# Patient Record
Sex: Male | Born: 1950
Health system: Southern US, Community
[De-identification: ages and names within clinical notes are randomized; demographics above are authoritative.]

## PROBLEM LIST (undated history)

## (undated) ENCOUNTER — Emergency Department: Discharge status: Absent without leave | Payer: Self-pay

## (undated) DIAGNOSIS — E785 Hyperlipidemia, unspecified: Secondary | ICD-10-CM

## (undated) DIAGNOSIS — M549 Dorsalgia, unspecified: Secondary | ICD-10-CM

## (undated) DIAGNOSIS — T7840XA Allergy, unspecified, initial encounter: Secondary | ICD-10-CM

## (undated) DIAGNOSIS — M2550 Pain in unspecified joint: Secondary | ICD-10-CM

## (undated) DIAGNOSIS — M1990 Unspecified osteoarthritis, unspecified site: Secondary | ICD-10-CM

## (undated) DIAGNOSIS — R7303 Prediabetes: Secondary | ICD-10-CM

## (undated) DIAGNOSIS — M25551 Pain in right hip: Secondary | ICD-10-CM

## (undated) DIAGNOSIS — G4730 Sleep apnea, unspecified: Secondary | ICD-10-CM

## (undated) DIAGNOSIS — K5730 Diverticulosis of large intestine without perforation or abscess without bleeding: Secondary | ICD-10-CM

## (undated) DIAGNOSIS — I10 Essential (primary) hypertension: Secondary | ICD-10-CM

## (undated) HISTORY — DX: Unspecified osteoarthritis, unspecified site: M19.90

## (undated) HISTORY — DX: Morbid (severe) obesity due to excess calories: E66.01

## (undated) HISTORY — PX: JOINT REPLACEMENT: SHX530

## (undated) HISTORY — DX: Hyperlipidemia, unspecified: E78.5

## (undated) HISTORY — DX: Dorsalgia, unspecified: M54.9

## (undated) HISTORY — DX: Pain in right hip: M25.551

## (undated) HISTORY — DX: Diverticulosis of large intestine without perforation or abscess without bleeding: K57.30

## (undated) HISTORY — DX: Sleep apnea, unspecified: G47.30

## (undated) HISTORY — DX: Prediabetes: R73.03

## (undated) HISTORY — PX: KNEE SURGERY: SHX244

## (undated) HISTORY — DX: Pain in unspecified joint: M25.50

## (undated) HISTORY — DX: Allergy, unspecified, initial encounter: T78.40XA

## (undated) HISTORY — PX: EYE SURGERY: SHX253

---

## 2010-11-06 LAB — HM COLONOSCOPY

## 2012-03-15 DEATH — deceased

## 2013-02-17 ENCOUNTER — Telehealth (HOSPITAL_COMMUNITY): Payer: Self-pay | Admitting: Emergency Medicine

## 2013-02-17 ENCOUNTER — Encounter (HOSPITAL_COMMUNITY): Payer: Self-pay

## 2013-02-17 ENCOUNTER — Emergency Department (HOSPITAL_COMMUNITY)
Admission: EM | Admit: 2013-02-17 | Discharge: 2013-02-17 | Disposition: A | Discharge status: Home / Self Care | Payer: BC Managed Care – PPO | Attending: Emergency Medicine | Admitting: Emergency Medicine

## 2013-02-17 DIAGNOSIS — Y9389 Activity, other specified: Secondary | ICD-10-CM | POA: Insufficient documentation

## 2013-02-17 DIAGNOSIS — S45809A Unspecified injury of other specified blood vessels at shoulder and upper arm level, unspecified arm, initial encounter: Secondary | ICD-10-CM | POA: Insufficient documentation

## 2013-02-17 DIAGNOSIS — W260XXA Contact with knife, initial encounter: Secondary | ICD-10-CM | POA: Insufficient documentation

## 2013-02-17 DIAGNOSIS — E785 Hyperlipidemia, unspecified: Secondary | ICD-10-CM | POA: Insufficient documentation

## 2013-02-17 DIAGNOSIS — S55102A Unspecified injury of radial artery at forearm level, left arm, initial encounter: Secondary | ICD-10-CM

## 2013-02-17 DIAGNOSIS — W261XXA Contact with sword or dagger, initial encounter: Secondary | ICD-10-CM | POA: Insufficient documentation

## 2013-02-17 DIAGNOSIS — Y92009 Unspecified place in unspecified non-institutional (private) residence as the place of occurrence of the external cause: Secondary | ICD-10-CM | POA: Insufficient documentation

## 2013-02-17 DIAGNOSIS — Z79899 Other long term (current) drug therapy: Secondary | ICD-10-CM | POA: Insufficient documentation

## 2013-02-17 DIAGNOSIS — I10 Essential (primary) hypertension: Secondary | ICD-10-CM | POA: Insufficient documentation

## 2013-02-17 HISTORY — DX: Hyperlipidemia, unspecified: E78.5

## 2013-02-17 HISTORY — DX: Essential (primary) hypertension: I10

## 2013-02-17 LAB — POCT I-STAT, CHEM 8
Chloride: 107 mEq/L (ref 96–112)
Creatinine, Ser: 1.1 mg/dL (ref 0.50–1.35)
Glucose, Bld: 90 mg/dL (ref 70–99)
Potassium: 3.4 mEq/L — ABNORMAL LOW (ref 3.5–5.1)

## 2013-02-17 LAB — CBC
HCT: 42.8 % (ref 39.0–52.0)
Hemoglobin: 14.7 g/dL (ref 13.0–17.0)
WBC: 6.6 10*3/uL (ref 4.0–10.5)

## 2013-02-17 MED ORDER — OXYCODONE-ACETAMINOPHEN 5-325 MG PO TABS
2.0000 | ORAL_TABLET | Freq: Once | ORAL | Status: AC
  Administered 2013-02-17: 2 via ORAL
  Filled 2013-02-17: qty 2

## 2013-02-17 MED ORDER — HYDROMORPHONE HCL PF 1 MG/ML IJ SOLN
0.5000 mg | Freq: Once | INTRAMUSCULAR | Status: DC
  Filled 2013-02-17: qty 1

## 2013-02-17 MED ORDER — DIPHENHYDRAMINE HCL 50 MG/ML IJ SOLN: 25.0000 mg | INTRAMUSCULAR | Status: DC | PRN

## 2013-02-17 NOTE — ED Notes (Signed)
MD at bedside.Dr. Izora Ribas

## 2013-02-17 NOTE — ED Notes (Signed)
Pt states he currently has conjunctivitis bilaterally.

## 2013-02-17 NOTE — ED Notes (Signed)
Re-paged Dr. Izora Ribas to [phone]

## 2013-02-17 NOTE — ED Notes (Signed)
Pt ambulatory to discharge with wife. Pt a x 4

## 2013-02-17 NOTE — H&P (Signed)
Reason for Consult: L wrist laceration Referring Physician: ER  John Morrow is an 61 y.o. right handed male.  HPI: using knife to open a bag and lacerated L wrist; c/o bleeding , pain; denies numbness of fingers, enies loss of function of fingers  Past Medical History  Diagnosis Date  . Hypertension   . Hyperlipemia     Past Surgical History  Procedure Laterality Date  . Knee surgery      No family history on file.  Social History:  reports that he has never smoked. He does not have any smokeless tobacco history on file. He reports that  drinks alcohol. He reports that he does not use illicit drugs.  Allergies:  Allergies  Allergen Reactions  . Morphine And Related     Rash, vomiting, itching     Medications: I have reviewed the patient's current medications.  Results for orders placed during the hospital encounter of 02/17/13 (from the past 48 hour(s))  CBC     Status: None   Collection Time    02/17/13 12:52 PM      Result Value Range   WBC 6.6  4.0 - 10.5 K/uL   RBC 4.88  4.22 - 5.81 MIL/uL   Hemoglobin 14.7  13.0 - 17.0 g/dL   HCT 59.5  63.8 - 75.6 %   MCV 87.7  78.0 - 100.0 fL   MCH 30.1  26.0 - 34.0 pg   MCHC 34.3  30.0 - 36.0 g/dL   RDW 43.3  29.5 - 18.8 %   Platelets 194  150 - 400 K/uL  POCT I-STAT, CHEM 8     Status: Abnormal   Collection Time    02/17/13 12:54 PM      Result Value Range   Sodium 143  135 - 145 mEq/L   Potassium 3.4 (*) 3.5 - 5.1 mEq/L   Chloride 107  96 - 112 mEq/L   BUN 24 (*) 6 - 23 mg/dL   Creatinine, Ser 4.16  0.50 - 1.35 mg/dL   Glucose, Bld 90  70 - 99 mg/dL   Calcium, Ion 6.06  3.01 - 1.30 mmol/L   TCO2 26  0 - 100 mmol/L   Hemoglobin 14.6  13.0 - 17.0 g/dL   HCT 60.1  09.3 - 23.5 %    No results found.  Pertinent items are noted in HPI. Temp:  [98.5 F (36.9 C)-98.7 F (37.1 C)] 98.5 F (36.9 C) (06/05 1439) Pulse Rate:  [68-98] 68 (06/05 1439) Resp:  [18-20] 18 (06/05 1439) BP: (114-133)/(59-73) 121/61 mmHg  (06/05 1439) SpO2:  [96 %] 96 % (06/05 1439) General appearance: alert and cooperative L wrist with 1cm laceration of distal wrist overlying where radial artery is, no active bleeding currently, forearm compartement sl swollen no ecchimosis, no evidence of compartment syndrome, able to flex, extend all fingers and thumb, neuro intact, absent palpable radial pulse but cap refill all fingers 2 sec   Assessment/Plan: Laceration of L wrist with probable radial artery injury  Plan:  Discussed exploration of wound repair of artery with patient and wife, pt does not desire this, wishes to return to [facility] to see surgeon there.  Expressed in detail the risks of re-bleeding with them, especially if patient aggressively moves wrist.  I recommend exploration and repair of artery.  They understand risks of rebleeding, compartment syndrome ...which may become an emergency.  They wish to be d/c'd.  John Morrow CHRISTOPHER 02/17/2013, 3:11 PM

## 2013-02-17 NOTE — ED Notes (Signed)
MD at bedside. (Dr. Miller) 

## 2013-02-17 NOTE — ED Provider Notes (Signed)
History     CSN: [id number]  Arrival date & time 02/17/13  1132   First MD Initiated Contact with Patient 02/17/13 1145      Chief Complaint  Patient presents with  . Puncture Wound    (Consider location/radiation/quality/duration/timing/severity/associated sxs/prior treatment) HPI Comments: The patient is a 62 year old male who presents after the acute onset of a puncture wound to his left distal radial forearm by a utility knife as he was doing some work at home. This pain was acute in onset, persistent, moderate, gradually worsening and associated with swelling of his left forearm, swelling in his hand. This is worse with palpation, had a significant amount of bleeding initially but after pressure and a dressing the bleeding has subsided and has been replaced with swelling of the forearm. He has no numbness or tingling to the fingers. No other injuries  The history is provided by the patient and the spouse.    Past Medical History  Diagnosis Date  . Hypertension   . Hyperlipemia     Past Surgical History  Procedure Laterality Date  . Knee surgery      No family history on file.  History  Substance Use Topics  . Smoking status: Never Smoker   . Smokeless tobacco: Not on file  . Alcohol Use: Yes     Comment: occassionally      Review of Systems  All other systems reviewed and are negative.    Allergies  Morphine and related  Home Medications   Current Outpatient Rx  Name  Route  Sig  Dispense  Refill  . lisinopril (PRINIVIL,ZESTRIL) 20 MG tablet   Oral   Take 20 mg by mouth daily.         . simvastatin (ZOCOR) 40 MG tablet   Oral   Take 40 mg by mouth every evening.         . tamsulosin (FLOMAX) 0.4 MG CAPS   Oral   Take 0.4 mg by mouth daily after supper.           BP 121/61  Pulse 68  Temp(Src) 98.5 F (36.9 C) (Oral)  Resp 18  SpO2 96%  Physical Exam  Nursing note and vitals reviewed. Constitutional: He appears well-developed and  well-nourished. No distress.  HENT:  Head: Normocephalic and atraumatic.  Mouth/Throat: Oropharynx is clear and moist. No oropharyngeal exudate.  Eyes: Conjunctivae and EOM are normal. Pupils are equal, round, and reactive to light. Right eye exhibits no discharge. Left eye exhibits no discharge. No scleral icterus.  Neck: Normal range of motion. Neck supple. No JVD present. No thyromegaly present.  Cardiovascular: Normal rate, regular rhythm, normal heart sounds and intact distal pulses.  Exam reveals no gallop and no friction rub.   No murmur heard. When the ulnar artery is occluded, capillary refill time is significantly delayed, radial pulse felt at wound site and just distal to wound site  Pulmonary/Chest: Effort normal and breath sounds normal. No respiratory distress. He has no wheezes. He has no rales.  Abdominal: Soft. Bowel sounds are normal. He exhibits no distension and no mass. There is no tenderness.  Musculoskeletal: Normal range of motion. He exhibits tenderness ( over the L forearm - compartment is swollen adn mildly tender.  Laceration approx 1cm and wtih mild bleeding). He exhibits no edema.  Able to make fist with some pain  Lymphadenopathy:    He has no cervical adenopathy.  Neurological: He is alert. Coordination normal.  No deficits to median  and radial or ulnar nerves,. Normals esnation and motor to the L hand  Skin: Skin is warm and dry. No rash noted. No erythema.  Psychiatric: He has a normal mood and affect. His behavior is normal.    ED Course  Procedures (including critical care time)  Labs Reviewed  POCT I-STAT, CHEM 8 - Abnormal; Notable for the following:    Potassium 3.4 (*)    BUN 24 (*)    All other components within normal limits  CBC   No results found.   1. Radial artery injury, left, initial encounter       MDM  62 year old male, puncture wound over the radial artery at the left wrist, will discussed with hand surgery regarding possible  repair as the patient does have delayed capillary refill when ulnar artery is occluded manually. Concern for increased expanding compartment of the left forearm, at this time the patient is neurovascular status is intact.  Patient declines pain medications at this time  This case with Hand surgeon Izora Ribas) who will evaluate the person in the emergency department, he is currently in the operating room but should be here shortly.  Surgeon has discussed with family the need for her and recommendation for hand surgery to fix a likely radial artery laceration. The patient has expressed his understanding and has refused this intervention to the surgeon. I have discussed this with the patient and he requests discharge and that he can go home to his home town and have this followed up at that time. He has been given a prescription for pain medication by the surgeon, I have given him a dose of pain medication prior to discharge, wrist immobilizer   Meds given in ED:  Medications  oxyCODONE-acetaminophen (PERCOCET/ROXICET) 5-325 MG per tablet 2 tablet (not administered)    New Prescriptions   No medications on file      Vida Roller, MD 02/17/13 1545

## 2013-02-17 NOTE — ED Notes (Addendum)
Pt states he was cutting a plastic tie on bungie cords when he cut himself with a knife. Pt arrives to room A-7 holding arm, drawn to body, site actively bleeding and blood noted on pts clothes. Red and  Swollen puncture site. Pulse present, cap refill <3 secs. Bleeding controlled and cleaned.

## 2014-03-31 DIAGNOSIS — G4730 Sleep apnea, unspecified: Secondary | ICD-10-CM | POA: Insufficient documentation

## 2016-01-30 ENCOUNTER — Emergency Department
Admission: EM | Admit: 2016-01-30 | Discharge: 2016-01-30 | Disposition: A | Discharge status: Home / Self Care | Payer: Medicare Other | Source: Home / Self Care | Attending: Family Medicine | Admitting: Family Medicine

## 2016-01-30 ENCOUNTER — Encounter: Payer: Self-pay | Admitting: Emergency Medicine

## 2016-01-30 DIAGNOSIS — M545 Low back pain: Secondary | ICD-10-CM | POA: Diagnosis not present

## 2016-01-30 DIAGNOSIS — N410 Acute prostatitis: Secondary | ICD-10-CM

## 2016-01-30 LAB — POCT CBC W AUTO DIFF (K'VILLE URGENT CARE)

## 2016-01-30 LAB — POCT URINALYSIS DIP (MANUAL ENTRY)
BILIRUBIN UA: NEGATIVE
BILIRUBIN UA: NEGATIVE
BILIRUBIN UA: NEGATIVE
Bilirubin, UA: NEGATIVE
GLUCOSE UA: NEGATIVE
GLUCOSE UA: NEGATIVE
Leukocytes, UA: NEGATIVE
NITRITE UA: NEGATIVE
Nitrite, UA: NEGATIVE
Protein Ur, POC: 30 — AB
Protein Ur, POC: 30 — AB
SPEC GRAV UA: 1.02 (ref 1.005–1.03)
SPEC GRAV UA: 1.02 (ref 1.005–1.03)
UROBILINOGEN UA: 1 (ref 0–1)
Urobilinogen, UA: 1 (ref 0–1)
pH, UA: 6.5 (ref 5–8)
pH, UA: 6 (ref 5–8)

## 2016-01-30 MED ORDER — CIPROFLOXACIN HCL 500 MG PO TABS: 500.0000 mg | ORAL_TABLET | Freq: Two times a day (BID) | ORAL | Status: DC

## 2016-01-30 NOTE — ED Notes (Signed)
LBP, right, mid to low abdominal pain, weakness x 2 week, polyuria

## 2016-01-30 NOTE — Discharge Instructions (Signed)
Increase fluid intake.  Discontinue Simvastatin while taking Cipro. If symptoms become significantly worse during the night or over the weekend, proceed to the local emergency room.    Prostatitis The prostate gland is about the size and shape of a walnut. It is located just below your bladder. It produces one of the components of semen, which is made up of sperm and the fluids that help nourish and transport it out from the testicles. Prostatitis is inflammation of the prostate gland.  There are four types of prostatitis:  Acute bacterial prostatitis. This is the least common type of prostatitis. It starts quickly and usually is associated with a bladder infection, high fever, and shaking chills. It can occur at any age.  Chronic bacterial prostatitis. This is a persistent bacterial infection in the prostate. It usually develops from repeated acute bacterial prostatitis or acute bacterial prostatitis that was not properly treated. It can occur in men of any age but is most common in middle-aged men whose prostate has begun to enlarge. The symptoms are not as severe as those in acute bacterial prostatitis. Discomfort in the part of your body that is in front of your rectum and below your scrotum (perineum), lower abdomen, or in the head of your penis (glans) may represent your primary discomfort.  Chronic prostatitis (nonbacterial). This is the most common type of prostatitis. It is inflammation of the prostate gland that is not caused by a bacterial infection. The cause is unknown and may be associated with a viral infection or autoimmune disorder.  Prostatodynia (pelvic floor disorder). This is associated with increased muscular tone in the pelvis surrounding the prostate. CAUSES The causes of bacterial prostatitis are bacterial infection. The causes of the other types of prostatitis are unknown.  SYMPTOMS  Symptoms can vary depending upon the type of prostatitis that exists. There can also be  overlap in symptoms. Possible symptoms for each type of prostatitis are listed below. Acute Bacterial Prostatitis  Painful urination.  Fever or chills.  Muscle or joint pains.  Low back pain.  Low abdominal pain.  Inability to empty bladder completely. Chronic Bacterial Prostatitis, Chronic Nonbacterial Prostatitis, and Prostatodynia  Sudden urge to urinate.  Frequent urination.  Difficulty starting urine stream.  Weak urine stream.  Discharge from the urethra.  Dribbling after urination.  Rectal pain.  Pain in the testicles, penis, or tip of the penis.  Pain in the perineum.  Problems with sexual function.  Painful ejaculation.  Bloody semen. DIAGNOSIS  In order to diagnose prostatitis, your health care provider will ask about your symptoms. One or more urine samples will be taken and tested (urinalysis). If the urinalysis result is negative for bacteria, your health care provider may use a finger to feel your prostate (digital rectal exam). This exam helps your health care provider determine if your prostate is swollen and tender. It will also produce a specimen of semen that can be analyzed. TREATMENT  Treatment for prostatitis depends on the cause. If a bacterial infection is the cause, it can be treated with antibiotic medicine. In cases of chronic bacterial prostatitis, the use of antibiotics for up to 1 month or 6 weeks may be necessary. Your health care provider may instruct you to take sitz baths to help relieve pain. A sitz bath is a bath of hot water in which your hips and buttocks are under water. This relaxes the pelvic floor muscles and often helps to relieve the pressure on your prostate. HOME CARE INSTRUCTIONS  Take all medicines as directed by your health care provider.  Take sitz baths as directed by your health care provider. SEEK MEDICAL CARE IF:   Your symptoms get worse, not better.  You have a fever. SEEK IMMEDIATE MEDICAL CARE IF:   You  have chills.  You feel nauseous or vomit.  You feel lightheaded or faint.  You are unable to urinate.  You have blood or blood clots in your urine. MAKE SURE YOU:  Understand these instructions.  Will watch your condition.  Will get help right away if you are not doing well or get worse.   This information is not intended to replace advice given to you by your health care provider. Make sure you discuss any questions you have with your health care provider.   Document Released: 08/29/2000 Document Revised: 09/22/2014 Document Reviewed: 03/21/2013 Elsevier Interactive Patient Education [employer].

## 2016-01-30 NOTE — ED Provider Notes (Signed)
CSN: [id number]     Arrival date & time 01/30/16  1433 History   First MD Initiated Contact with Patient 01/30/16 1508     Chief Complaint  Patient presents with  . Back Pain      HPI Comments: Patient complains of approximately 2 week history of vague non-radiating right lower back ache, without history of injury.  During the past week he has had vague lower abdominal discomfort without nausea/vomiting or change in bowel movements.  No fevers, chills, and sweats.  During the past 2 to 3 days he has been unusually fatigued.  He has noted increase in urine frequency. He has had two prostate infections in the past.  He notes that he has had two negative screening colonoscopies in the past.  The history is provided by the patient and the spouse.    Past Medical History  Diagnosis Date  . Hypertension   . Hyperlipemia    Past Surgical History  Procedure Laterality Date  . Knee surgery     No family history on file. Social History  Substance Use Topics  . Smoking status: Never Smoker   . Smokeless tobacco: None  . Alcohol Use: Yes     Comment: occassionally    Review of Systems  Constitutional: Positive for activity change and fatigue. Negative for fever, chills, diaphoresis and appetite change.  HENT: Negative.   Eyes: Negative.   Respiratory: Negative.   Cardiovascular: Negative.   Gastrointestinal: Positive for abdominal pain. Negative for nausea, vomiting, diarrhea, constipation, blood in stool, abdominal distention, anal bleeding and rectal pain.  Genitourinary: Positive for frequency. Negative for dysuria, urgency, hematuria, flank pain, decreased urine volume, scrotal swelling, difficulty urinating and testicular pain.  Musculoskeletal: Negative.   Skin: Negative.   Neurological: Negative for headaches.    Allergies  Morphine and related  Home Medications   Prior to Admission medications   Medication Sig Start Date End Date Taking? Authorizing Provider    ciprofloxacin (CIPRO) 500 MG tablet Take 1 tablet (500 mg total) by mouth 2 (two) times daily. 01/30/16   Kandra Nicolas, MD  lisinopril (PRINIVIL,ZESTRIL) 20 MG tablet Take 20 mg by mouth daily.    Historical Provider, MD  simvastatin (ZOCOR) 40 MG tablet Take 40 mg by mouth every evening.    Historical Provider, MD  tamsulosin (FLOMAX) 0.4 MG CAPS Take 0.4 mg by mouth daily after supper.    Historical Provider, MD   Meds Ordered and Administered this Visit  Medications - No data to display  BP 155/95 mmHg  Pulse 93  Temp(Src) 99.7 F (37.6 C) (Oral)  Ht 6' (1.829 m)  Wt 300 lb (136.079 kg)  BMI 40.68 kg/m2  SpO2 95% No data found.   Physical Exam  Constitutional: He is oriented to person, place, and time. He appears well-developed and well-nourished. No distress.  HENT:  Head: Normocephalic.  Nose: Nose normal.  Mouth/Throat: Oropharynx is clear and moist.  Eyes: Conjunctivae and EOM are normal. Pupils are equal, round, and reactive to light.  Neck: Neck supple.  Cardiovascular: Normal heart sounds.   Pulmonary/Chest: Breath sounds normal.  Abdominal: Soft. Bowel sounds are normal. He exhibits no distension and no mass. There is no hepatosplenomegaly. There is tenderness in the periumbilical area. There is no rigidity, no rebound, no guarding, no CVA tenderness, no tenderness at McBurney's point and negative Murphy's sign. No hernia.    Genitourinary: Rectum normal. Guaiac negative stool. Prostate is enlarged and tender.  Prostate is moderately  enlarged but symmetric without nodules.  Mild tenderness to palpation.  Musculoskeletal: He exhibits no edema.  Lymphadenopathy:    He has no cervical adenopathy.  Neurological: He is alert and oriented to person, place, and time.  Skin: Skin is warm and dry. He is not diaphoretic.  Nursing note and vitals reviewed.   ED Course  Procedures none    Labs Reviewed  POCT URINALYSIS DIP (MANUAL ENTRY) - Abnormal; Notable for the  following:    Blood, UA trace-lysed (*)    Protein Ur, POC =30 (*)    All other components within normal limits   POCT URINALYSIS DIP, AFTER PROSTATE EXAM (MANUAL ENTRY) - Abnormal; Notable for the following:    Blood, UA small (*)    Protein Ur, POC =30 (*)    Leukocytes, UA small (1+) (*)    All other components within normal limits  POCT CBC W AUTO DIFF ([facility])CBC:  WBC 5.6; LY 18.0; MO 9.6; GR 72.4; Hgb 13.3; Platelets 201````````````      MDM   1. Right low back pain, with sciatica presence unspecified   2. Prostatitis, acute; normal White blood count reassuring.    Urine culture pending.  Begin Cipro 500mg  BID for 2 weeks. With history of vague fatigue and abdominal discomfort, will send CMP also. Followup with Family Doctor.    Kandra Nicolas, MD 01/30/16 [phone]

## 2016-01-31 LAB — COMPLETE METABOLIC PANEL WITH GFR
ALBUMIN: 4.2 g/dL (ref 3.6–5.1)
ALK PHOS: 46 U/L (ref 40–115)
ALT: 26 U/L (ref 9–46)
AST: 28 U/L (ref 10–35)
BILIRUBIN TOTAL: 0.7 mg/dL (ref 0.2–1.2)
BUN: 21 mg/dL (ref 7–25)
CO2: 29 mmol/L (ref 20–31)
CREATININE: 1.2 mg/dL (ref 0.70–1.25)
Calcium: 8.9 mg/dL (ref 8.6–10.3)
Chloride: 100 mmol/L (ref 98–110)
GFR, EST NON AFRICAN AMERICAN: 63 mL/min (ref >=60)
GFR, Est African American: 72 mL/min (ref >=60)
GLUCOSE: 93 mg/dL (ref 65–99)
Potassium: 4 mmol/L (ref 3.5–5.3)
SODIUM: 140 mmol/L (ref 135–146)
TOTAL PROTEIN: 7.2 g/dL (ref 6.1–8.1)

## 2016-02-01 ENCOUNTER — Telehealth: Payer: Self-pay

## 2016-02-01 LAB — URINE CULTURE
Colony Count: NO GROWTH
Organism ID, Bacteria: NO GROWTH

## 2016-02-01 NOTE — ED Notes (Signed)
Left message for patient that labs were normal and if he has any questions or problems to call the UC or his PCP.

## 2016-05-29 ENCOUNTER — Other Ambulatory Visit: Payer: Self-pay | Admitting: Family Medicine

## 2016-05-29 ENCOUNTER — Encounter: Payer: Self-pay | Admitting: Family Medicine

## 2016-05-29 ENCOUNTER — Ambulatory Visit (INDEPENDENT_AMBULATORY_CARE_PROVIDER_SITE_OTHER): Payer: Medicare Other | Admitting: Family Medicine

## 2016-05-29 ENCOUNTER — Other Ambulatory Visit: Payer: Self-pay

## 2016-05-29 VITALS — BP 158/92 | HR 86 | Ht 71.0 in | Wt 315.0 lb

## 2016-05-29 DIAGNOSIS — E785 Hyperlipidemia, unspecified: Secondary | ICD-10-CM | POA: Insufficient documentation

## 2016-05-29 DIAGNOSIS — E6609 Other obesity due to excess calories: Secondary | ICD-10-CM | POA: Insufficient documentation

## 2016-05-29 DIAGNOSIS — N400 Enlarged prostate without lower urinary tract symptoms: Secondary | ICD-10-CM | POA: Diagnosis not present

## 2016-05-29 DIAGNOSIS — I10 Essential (primary) hypertension: Secondary | ICD-10-CM

## 2016-05-29 DIAGNOSIS — Z23 Encounter for immunization: Secondary | ICD-10-CM | POA: Diagnosis not present

## 2016-05-29 DIAGNOSIS — Z1159 Encounter for screening for other viral diseases: Secondary | ICD-10-CM

## 2016-05-29 DIAGNOSIS — E669 Obesity, unspecified: Secondary | ICD-10-CM

## 2016-05-29 DIAGNOSIS — R350 Frequency of micturition: Secondary | ICD-10-CM | POA: Diagnosis not present

## 2016-05-29 HISTORY — DX: Hyperlipidemia, unspecified: E78.5

## 2016-05-29 HISTORY — DX: Morbid (severe) obesity due to excess calories: E66.01

## 2016-05-29 MED ORDER — AMLODIPINE BESYLATE 10 MG PO TABS: 10.0000 mg | ORAL_TABLET | Freq: Every day | ORAL | 1 refills | Status: DC

## 2016-05-29 NOTE — Progress Notes (Signed)
John Morrow is a 65 y.o. male who presents to [facility] today for establish care and discuss hypertension, urinary frequency, hyperlipidemia.  Hypertension: Previously managed with amlodipine and hydrochlorothiazide. Patient notes she's had more urinary frequency than usual recently. He discontinued hydrochlorothiazide. He denies any chest pains palpitations or shortness of breath. He notes the discontinuation of hydrochlorothiazide has not changed his urinary frequency much.  Urinary frequency: Patient notes urinary frequency and urgency. He denies any dysuria. This is been ongoing for several weeks. He gets up multiple times at night to urinate. He denies history of diabetes. He does note he has had BPH in the past. Additionally's had prostatitis in the past. His current symptoms are not consistent with previous episodes of prostatitis. He continues to use Flomax.  AUA Symptome Score: 16/35 --- Moderate QOL score 5/6 --- Severe  Hyperlipidemia: Patient takes simvastatin as noted below. He denies significant muscle pain.   Past Medical History:  Diagnosis Date  . Hyperlipemia   . Hypertension    Past Surgical History:  Procedure Laterality Date  . KNEE SURGERY     Social History  Substance Use Topics  . Smoking status: Never Smoker  . Smokeless tobacco: Never Used  . Alcohol use Yes     Comment: occassionally   family history includes Lung disease in his father and mother.  ROS as above: No headache, visual changes, nausea, vomiting, diarrhea, constipation, dizziness, abdominal pain, skin rash, fevers, chills, night sweats, weight loss, swollen lymph nodes, body aches, joint swelling, muscle aches, chest pain, shortness of breath, mood changes, visual or auditory hallucinations.    Medications: Current Outpatient Prescriptions  Medication Sig Dispense  Refill  . amLODipine (NORVASC) 10 MG tablet Take 1 tablet (10 mg total) by mouth daily. 90 tablet 1  . hydrochlorothiazide (HYDRODIURIL) 25 MG tablet     . simvastatin (ZOCOR) 40 MG tablet     . tamsulosin (FLOMAX) 0.4 MG CAPS capsule      No current facility-administered medications for this visit.    Allergies  Allergen Reactions  . Morphine And Related     Rash, vomiting, itching      Exam:  BP (!) 158/92   Pulse 86   Ht 5\' 11"  (1.803 m)   Wt (!) 315 lb (142.9 kg)   SpO2 98%   BMI 43.93 kg/m  Gen: Well NAD Obese HEENT: EOMI,  MMM Lungs: Normal work of breathing. CTABL Heart: RRR no MRG Abd: NABS, Soft. Nondistended, Nontender Exts: Brisk capillary refill, warm and well perfused. . Prostate exam deferred  No results found for this or any previous visit (from the past 24 hour(s)). No results found.    Assessment and Plan: 65 y.o. male with  1) Urinary frequency: Concerning for BPH, undiagnosed diabetes, urine tract infection etc. Urine micral on culture pending. Fasting labs including PSA and diabetes testing pending. Follow-up in the near future.  2) Hypertension: Not a goal. Increase amlodipine to 10 mg. We'll readdress hydrochlorothiazide at the next visit. Obtain basic fasting labs  3) hyperlipidemia: Continue current regimen. Check fasting lipids  4) history of BPH: Check PSA. Rectal exam deferred today  Influenza and Tdap vaccine given today prior to discharge    Orders Placed This Encounter  Procedures  . Urine culture  . Flu Vaccine QUAD 36+ mos IM  . Tdap vaccine greater than or equal to 7yo IM  . Urinalysis, Routine w reflex microscopic  .  CBC  . Comprehensive metabolic panel    Order Specific Question:   Has the patient fasted?    Answer:   No  . Hemoglobin A1c  . Lipid panel    Order Specific Question:   Has the patient fasted?    Answer:   No  . HIV antibody  . Hepatitis C antibody  . TSH  . VITAMIN D 25 Hydroxy (Vit-D Deficiency,  Fractures)  . PSA    Discussed warning signs or symptoms. Please see discharge instructions. Patient expresses understanding.

## 2016-05-29 NOTE — Patient Instructions (Signed)
Thank you for coming in today. Increase amlodipine to 10 mg daily.  Get fasting blood work soon.  Follow up in a few weeks.    Urinary Frequency The number of times a normal person urinates depends upon how much liquid they take in and how much liquid they are losing. If the temperature is hot and there is high humidity, then the person will sweat more and usually breathe a little more frequently. These factors decrease the amount of frequency of urination that would be considered normal. The amount you drink is easily determined, but the amount of fluid lost is sometimes more difficult to calculate.  Fluid is lost in two ways:  Sensible fluid loss is usually measured by the amount of urine that you get rid of. Losses of fluid can also occur with diarrhea.  Insensible fluid loss is more difficult to measure. It is caused by evaporation. Insensible loss of fluid occurs through breathing and sweating. It usually ranges from a little less than a quart to a little more than a quart of fluid a day. In normal temperatures and activity levels, the average person may urinate 4 to 7 times in a 24-hour period. Needing to urinate more often than that could indicate a problem. If one urinates 4 to 7 times in 24 hours and has large volumes each time, that could indicate a different problem from one who urinates 4 to 7 times a day and has small volumes. The time of urinating is also important. Most urinating should be done during the waking hours. Getting up at night to urinate frequently can indicate some problems. CAUSES  The bladder is the organ in your lower abdomen that holds urine. Like a balloon, it swells some as it fills up. Your nerves sense this and tell you it is time to head for the bathroom. There are a number of reasons that you might feel the need to urinate more often than usual. They include:  Urinary tract infection. This is usually associated with other signs such as burning when you  urinate.  In men, problems with the prostate (a walnut-size gland that is located near the tube that carries urine out of your body). There are two reasons why the prostate can cause an increased frequency of urination:  An enlarged prostate that does not let the bladder empty well. If the bladder only half empties when you urinate, then it only has half the capacity to fill before you have to urinate again.  The nerves in the bladder become more hypersensitive with an increased size of the prostate even if the bladder empties completely.  Pregnancy.  Obesity. Excess weight is more likely to cause a problem for women than for men.  Bladder stones or other bladder problems.  Caffeine.  Alcohol.  Medications. For example, drugs that help the body get rid of extra fluid (diuretics) increase urine production. Some other medicines must be taken with lots of fluids.  Muscle or nerve weakness. This might be the result of a spinal cord injury, a stroke, multiple sclerosis, or Parkinson disease.  Long-standing diabetes can decrease the sensation of the bladder. This loss of sensation makes it harder to sense the bladder needs to be emptied. Over a period of years, the bladder is stretched out by constant overfilling. This weakens the bladder muscles so that the bladder does not empty well and has less capacity to fill with new urine.  Interstitial cystitis (also called painful bladder syndrome). This condition develops  because the tissues that line the inside of the bladder are inflamed (inflammation is the body's way of reacting to injury or infection). It causes pain and frequent urination. It occurs in women more often than in men. DIAGNOSIS   To decide what might be causing your urinary frequency, your health care provider will probably:  Ask about symptoms you have noticed.  Ask about your overall health. This will include questions about any medications you are taking.  Do a physical  examination.  Order some tests. These might include:  A blood test to check for diabetes or other health issues that could be contributing to the problem.  Urine testing. This could measure the flow of urine and the pressure on the bladder.  A test of your neurological system (the brain, spinal cord, and nerves). This is the system that senses the need to urinate.  A bladder test to check whether it is emptying completely when you urinate.  Cystoscopy. This test uses a thin tube with a tiny camera on it. It offers a look inside your urethra and bladder to see if there are problems.  Imaging tests. You might be given a contrast dye and then asked to urinate. X-rays are taken to see how your bladder is working. TREATMENT  It is important for you to be evaluated to determine if the amount or frequency that you have is unusual or abnormal. If it is found to be abnormal, the cause should be determined and this can usually be found out easily. Depending upon the cause, treatment could include medication, stimulation of the nerves, or surgery. There are not too many things that you can do as an individual to change your urinary frequency. It is important that you balance the amount of fluid intake needed to compensate for your activity and the temperature. Medical problems will be diagnosed and taken care of by your physician. There is no particular bladder training such as Kegel exercises that you can do to help urinary frequency. This is an exercise that is usually recommended for people who have leaking of urine when they laugh, cough, or sneeze. HOME CARE INSTRUCTIONS   Take any medications your health care provider prescribed or suggested. Follow the directions carefully.  Practice any lifestyle changes that are recommended. These might include:  Drinking less fluid or drinking at different times of the day. If you need to urinate often during the night, for example, you may need to stop  drinking fluids early in the evening.  Cutting down on caffeine or alcohol. They both can make you need to urinate more often than normal. Caffeine is found in coffee, tea, and sodas.  Losing weight, if that is recommended.  Keep a journal or a log. You might be asked to record how much you drink and when and where you feel the need to urinate. This will also help evaluate how well the treatment provided by your physician is working. SEEK MEDICAL CARE IF:   Your need to urinate often gets worse.  You feel increased pain or irritation when you urinate.  You notice blood in your urine.  You have questions about any medications that your health care provider recommended.  You notice blood, pus, or swelling at the site of any test or treatment procedure.  You develop a fever of more than 100.53F (38.1C). SEEK IMMEDIATE MEDICAL CARE IF:  You develop a fever of more than 102.80F (38.9C).   This information is not intended to replace advice  given to you by your health care provider. Make sure you discuss any questions you have with your health care provider.   Document Released: 06/28/2009 Document Revised: 09/22/2014 Document Reviewed: 06/28/2009 Elsevier Interactive Patient Education [employer].

## 2016-05-30 LAB — URINALYSIS, ROUTINE W REFLEX MICROSCOPIC
Bilirubin Urine: NEGATIVE
GLUCOSE, UA: NEGATIVE
Ketones, ur: NEGATIVE
NITRITE: NEGATIVE
PH: 6.5 (ref 5.0–8.0)
Specific Gravity, Urine: 1.019 (ref 1.001–1.035)

## 2016-05-30 LAB — URINALYSIS, MICROSCOPIC ONLY
CRYSTALS: NONE SEEN [HPF]
Casts: NONE SEEN [LPF]
Squamous Epithelial / LPF: NONE SEEN [HPF] (ref <=5)
Yeast: NONE SEEN [HPF]

## 2016-05-30 MED ORDER — CIPROFLOXACIN HCL 500 MG PO TABS: 500.0000 mg | ORAL_TABLET | Freq: Two times a day (BID) | ORAL | 0 refills | Status: DC

## 2016-05-30 NOTE — Addendum Note (Signed)
Addended by: Gregor Hams on: 05/30/2016 07:13 AM   Modules accepted: Orders

## 2016-05-31 LAB — URINE CULTURE

## 2016-06-05 ENCOUNTER — Telehealth: Payer: Self-pay | Admitting: Family Medicine

## 2016-06-05 LAB — CBC
HEMATOCRIT: 40.2 % (ref 38.5–50.0)
HEMOGLOBIN: 13.2 g/dL (ref 13.2–17.1)
MCH: 28 pg (ref 27.0–33.0)
MCHC: 32.8 g/dL (ref 32.0–36.0)
MCV: 85.4 fL (ref 80.0–100.0)
MPV: 10.1 fL (ref 7.5–12.5)
Platelets: 243 10*3/uL (ref 140–400)
RBC: 4.71 MIL/uL (ref 4.20–5.80)
RDW: 13.7 % (ref 11.0–15.0)
WBC: 5.2 10*3/uL (ref 3.8–10.8)

## 2016-06-05 MED ORDER — SIMVASTATIN 40 MG PO TABS: 40.0000 mg | ORAL_TABLET | Freq: Every day | ORAL | 0 refills | Status: DC

## 2016-06-05 NOTE — Telephone Encounter (Signed)
Mr. John Morrow came in to say that he had spoken with you earlier this week about prescriptions he may need refilled. He said he needs a refill on Simvastatin 40mg  and would like to get those before his CPE next week. He uses Product/process development scientist at [facility] [address] Marina Gravel!

## 2016-06-05 NOTE — Telephone Encounter (Signed)
Medicine refilled. 

## 2016-06-06 LAB — LIPID PANEL
CHOL/HDL RATIO: 3.2 ratio (ref <=5.0)
CHOLESTEROL: 100 mg/dL — AB (ref 125–200)
HDL: 31 mg/dL — AB (ref >=40)
LDL Cholesterol: 50 mg/dL (ref <130)
TRIGLYCERIDES: 93 mg/dL (ref <150)
VLDL: 19 mg/dL (ref <30)

## 2016-06-06 LAB — COMPREHENSIVE METABOLIC PANEL
ALBUMIN: 3.7 g/dL (ref 3.6–5.1)
ALT: 15 U/L (ref 9–46)
AST: 18 U/L (ref 10–35)
Alkaline Phosphatase: 53 U/L (ref 40–115)
BUN: 21 mg/dL (ref 7–25)
CALCIUM: 8.9 mg/dL (ref 8.6–10.3)
CHLORIDE: 104 mmol/L (ref 98–110)
CO2: 25 mmol/L (ref 20–31)
Creat: 1.18 mg/dL (ref 0.70–1.25)
GLUCOSE: 92 mg/dL (ref 65–99)
Potassium: 4.1 mmol/L (ref 3.5–5.3)
Sodium: 140 mmol/L (ref 135–146)
Total Bilirubin: 0.6 mg/dL (ref 0.2–1.2)
Total Protein: 6.6 g/dL (ref 6.1–8.1)

## 2016-06-06 LAB — HEMOGLOBIN A1C
Hgb A1c MFr Bld: 5.4 % (ref <5.7)
MEAN PLASMA GLUCOSE: 108 mg/dL

## 2016-06-06 LAB — HIV ANTIBODY (ROUTINE TESTING W REFLEX): HIV 1&2 Ab, 4th Generation: NONREACTIVE

## 2016-06-06 LAB — VITAMIN D 25 HYDROXY (VIT D DEFICIENCY, FRACTURES): Vit D, 25-Hydroxy: 31 ng/mL (ref 30–100)

## 2016-06-06 LAB — TSH: TSH: 1.18 mIU/L (ref 0.40–4.50)

## 2016-06-06 LAB — PSA: PSA: 2.9 ng/mL (ref <=4.0)

## 2016-06-06 LAB — HEPATITIS C ANTIBODY: HCV Ab: NEGATIVE

## 2016-06-10 ENCOUNTER — Ambulatory Visit (INDEPENDENT_AMBULATORY_CARE_PROVIDER_SITE_OTHER): Payer: Medicare Other | Admitting: Family Medicine

## 2016-06-10 ENCOUNTER — Encounter: Payer: Self-pay | Admitting: Family Medicine

## 2016-06-10 VITALS — BP 145/74 | HR 79 | Wt 311.0 lb

## 2016-06-10 DIAGNOSIS — Z0000 Encounter for general adult medical examination without abnormal findings: Secondary | ICD-10-CM | POA: Diagnosis not present

## 2016-06-10 DIAGNOSIS — Z23 Encounter for immunization: Secondary | ICD-10-CM

## 2016-06-10 NOTE — Patient Instructions (Signed)
Thank you for coming in today. Keep a home blood pressure log.  If you top number is > than 140 and the bottom number is >90 more than a few times please make a follow up appointment soon.  Return in 1 year or sooner if needed.  Call or go to the emergency room if you get worse, have trouble breathing, have chest pains, or palpitations.    How to Take Your Blood Pressure HOW DO I GET A BLOOD PRESSURE MACHINE?  You can buy an electronic home blood pressure machine at your local pharmacy. Insurance will sometimes cover the cost if you have a prescription.  Ask your doctor what type of machine is best for you. There are different machines for your arm and your wrist.  If you decide to buy a machine to check your blood pressure on your arm, first check the size of your arm so you can buy the right size cuff. To check the size of your arm:   Use a measuring tape that shows both inches and centimeters.   Wrap the measuring tape around the upper-middle part of your arm. You may need someone to help you measure.   Write down your arm measurement in both inches and centimeters.   To measure your blood pressure correctly, it is important to have the right size cuff.   If your arm is up to 13 inches (up to 34 centimeters), get an adult cuff size.  If your arm is 13 to 17 inches (35 to 44 centimeters), get a large adult cuff size.    If your arm is 17 to 20 inches (45 to 52 centimeters), get an adult thigh cuff.  WHAT DO THE NUMBERS MEAN?   There are two numbers that make up your blood pressure. For example: 120/80.  The first number (120 in our example) is called the "systolic pressure." It is a measure of the pressure in your blood vessels when your heart is pumping blood.  The second number (80 in our example) is called the "diastolic pressure." It is a measure of the pressure in your blood vessels when your heart is resting between beats.  Your doctor will tell you what your blood  pressure should be. WHAT SHOULD I DO BEFORE I CHECK MY BLOOD PRESSURE?   Try to rest or relax for at least 30 minutes before you check your blood pressure.  Do not smoke.  Do not have any drinks with caffeine, such as:  Soda.  Coffee.  Tea.  Check your blood pressure in a quiet room.  Sit down and stretch out your arm on a table. Keep your arm at about the level of your heart. Let your arm relax.  Make sure that your legs are not crossed. HOW DO I CHECK MY BLOOD PRESSURE?  Follow the directions that came with your machine.  Make sure you remove any tight-fitting clothing from your arm or wrist. Wrap the cuff around your upper arm or wrist. You should be able to fit a finger between the cuff and your arm. If you cannot fit a finger between the cuff and your arm, it is too tight and should be removed and rewrapped.  Some units require you to manually pump up the arm cuff.  Automatic units inflate the cuff when you press a button.  Cuff deflation is automatic in both models.  After the cuff is inflated, the unit measures your blood pressure and pulse. The readings are shown on  a monitor. Hold still and breathe normally while the cuff is inflated.  Getting a reading takes less than a minute.  Some models store readings in a memory. Some provide a printout of readings. If your machine does not store your readings, keep a written record.  Take readings with you to your next visit with your doctor.   This information is not intended to replace advice given to you by your health care provider. Make sure you discuss any questions you have with your health care provider.   Document Released: 08/14/2008 Document Revised: 09/22/2014 Document Reviewed: 10/27/2013 Elsevier Interactive Patient Education 2016 Elsevier Inc.   Blood Pressure Record Sheet Your blood pressure on this visit to the emergency department or clinic is elevated. This does not necessarily mean you have high  blood pressure (hypertension), but it does mean that your blood pressure needs to be rechecked. Many times your blood pressure can increase due to illness, pain, anxiety, or other factors. We recommend that you get a series of blood pressure readings done over a period of 5 days. It is best to get a reading in the morning and one in the evening. You should make sure to sit and relax for 1-5 minutes before the reading is taken. Write the readings down and make a follow-up appointment with your health care provider to discuss the results. If there is not a free clinic or a drug store with a blood-pressure-taking machine near you, you can purchase blood-pressure-taking equipment from a drug store. Having one in the home allows you the convenience of taking your blood pressure while you are home and relaxed.  Your blood pressure in the emergency department or clinic on ________ was ____________________. BLOOD PRESSURE LOG Date: _______________________  a.m. _____________________  p.m. _____________________ Date: _______________________  a.m. _____________________  p.m. _____________________ Date: _______________________  a.m. _____________________  p.m. _____________________ Date: _______________________  a.m. _____________________  p.m. _____________________ Date: _______________________  a.m. _____________________  p.m. _____________________   This information is not intended to replace advice given to you by your health care provider. Make sure you discuss any questions you have with your health care provider.   Document Released: 05/31/2003 Document Revised: 09/22/2014 Document Reviewed: 10/25/2013 Elsevier Interactive Patient Education [employer].

## 2016-06-10 NOTE — Progress Notes (Signed)
Subjective:    John Morrow is a 65 y.o. male who presents for Medicare Annual/Subsequent preventive examination.   Preventive Screening-Counseling & Management  Tobacco History  Smoking Status  . Never Smoker  Smokeless Tobacco  . Never Used    Problems Prior to Visit 1. . Recent episode of Cystitis treated with Cipro. Patient is feeling much better.   Current Problems (verified) Patient Active Problem List   Diagnosis Date Noted  . BPH (benign prostatic hyperplasia) 05/29/2016  . Urine frequency 05/29/2016  . HTN (hypertension) 05/29/2016  . HLD (hyperlipidemia) 05/29/2016  . Obese 05/29/2016    Medications Prior to Visit Current Outpatient Prescriptions on File Prior to Visit  Medication Sig Dispense Refill  . amLODipine (NORVASC) 10 MG tablet Take 1 tablet (10 mg total) by mouth daily. 90 tablet 1  . hydrochlorothiazide (HYDRODIURIL) 25 MG tablet     . simvastatin (ZOCOR) 40 MG tablet Take 1 tablet (40 mg total) by mouth daily at 6 PM. 30 tablet 0  . tamsulosin (FLOMAX) 0.4 MG CAPS capsule      No current facility-administered medications on file prior to visit.     Current Medications (verified) Current Outpatient Prescriptions  Medication Sig Dispense Refill  . amLODipine (NORVASC) 10 MG tablet Take 1 tablet (10 mg total) by mouth daily. 90 tablet 1  . hydrochlorothiazide (HYDRODIURIL) 25 MG tablet     . simvastatin (ZOCOR) 40 MG tablet Take 1 tablet (40 mg total) by mouth daily at 6 PM. 30 tablet 0  . tamsulosin (FLOMAX) 0.4 MG CAPS capsule      No current facility-administered medications for this visit.      Allergies (verified) Morphine and related   PAST HISTORY  Family History Family History  Problem Relation Age of Onset  . Lung disease Mother   . Lung disease Father     Social History Social History  Substance Use Topics  . Smoking status: Never Smoker  . Smokeless tobacco: Never Used  . Alcohol use Yes     Comment: occassionally     Are there smokers in your home (other than you)?  No  Risk Factors Current exercise habits: Walking Dietary issues discussed: Low carbs  Cardiac risk factors: advanced age (older than 70 for men, 59 for women), dyslipidemia, hypertension, male gender and obesity (BMI >= 30 kg/m2).  Depression Screen (Note: if answer to either of the following is "Yes", a more complete depression screening is indicated)   Q1: Over the past two weeks, have you felt down, depressed or hopeless? No  Q2: Over the past two weeks, have you felt little interest or pleasure in doing things? No  Have you lost interest or pleasure in daily life? No  Do you often feel hopeless? No  Do you cry easily over simple problems? No  Activities of Daily Living In your present state of health, do you have any difficulty performing the following activities?:  Driving? No Managing money?  No Feeding yourself? No Getting from bed to chair? No Climbing a flight of stairs? No Preparing food and eating?: No Bathing or showering? No Getting dressed: No Getting to the toilet? No Using the toilet:No Moving around from place to place: No In the past year have you fallen or had a near fall?:No     Hearing Difficulties: No Do you often ask people to speak up or repeat themselves? No Do you experience ringing or noises in your ears? No Do you have difficulty understanding soft  or whispered voices? No   Do you feel that you have a problem with memory? No  Do you often misplace items? No  Do you feel safe at home?  Yes  Cognitive Testing  Alert? Yes  Normal Appearance?Yes  Oriented to person? Yes  Place? Yes   Time? Yes  Recall of three objects?  Yes  Can perform simple calculations? Yes  Displays appropriate judgment?Yes  Can read the correct time from a watch face?Yes   Advanced Directives have been discussed with the patient? Yes   Immunization History  Administered Date(s) Administered  .  Influenza,inj,Quad PF,36+ Mos 05/29/2016  . Tdap 05/29/2016    Screening Tests Health Maintenance  Topic Date Due  . COLONOSCOPY  10/08/1999  . ZOSTAVAX  10/07/2009  . PNA vac Low Risk Adult (1 of 2 - PCV13) 10/07/2014  . TETANUS/TDAP  05/29/2026  . INFLUENZA VACCINE  Completed  . Hepatitis C Screening  Completed    Depression screen [facility] 2/9 06/10/2016  Decreased Interest 0  Down, Depressed, Hopeless 0  PHQ - 2 Score 0   Cit6: 2  All answers were reviewed with the patient and necessary referrals were made:  Lynne Leader, MD   06/10/2016   History reviewed: allergies, current medications, past family history, past medical history, past social history, past surgical history and problem list  Review of Systems Pertinent items noted in HPI and remainder of comprehensive ROS otherwise negative.    Objective:     Vision by Snellen chart: right eye:20/20, left eye:20/13 Blood pressure (!) 145/74, pulse 79, weight (!) 311 lb (141.1 kg). Body mass index is 43.38 kg/m.  BP (!) 145/74   Pulse 79   Wt (!) 311 lb (141.1 kg)   BMI 43.38 kg/m   General Appearance:    Alert, cooperative, no distress, appears stated age  Head:    Normocephalic, without obvious abnormality, atraumatic  Eyes:    PERRL, conjunctiva/corneas clear, EOM's intact,      Ears:    Normal TM's and external ear canals, both ears  Nose:   Nares normal, septum midline, mucosa normal, no drainage    or sinus tenderness  Throat:   Lips, mucosa, and tongue normal; teeth and gums normal  Neck:   Supple, symmetrical, trachea midline, no adenopathy;       thyroid:  No enlargement/tenderness/nodules; no carotid   bruit or JVD  Back:     Symmetric, no curvature, ROM normal, no CVA tenderness  Lungs:     Clear to auscultation bilaterally, respirations unlabored  Chest wall:    No tenderness or deformity  Heart:    Regular rate and rhythm, S1 and S2 normal, no murmur, rub   or gallop  Abdomen:     Soft, non-tender,  bowel sounds active all four quadrants,    no masses, no organomegaly        Extremities:   Extremities normal, atraumatic, no cyanosis or edema  Pulses:   2+ and symmetric all extremities  Skin:   Skin color, texture, turgor normal, no rashes or lesions  Lymph nodes:   Cervical, supraclavicular, and axillary nodes normal  Neurologic:   CNII-XII intact. Normal strength, sensation and reflexes      throughout       Assessment:     Well adult. Elevated blood pressure     Plan:     During the course of the visit the patient was educated and counseled about appropriate screening and preventive services  including:    Pneumococcal vaccine   Smoking cessation counseling  Diet review for nutrition referral? Yes ____  Not Indicated __xx__  Elevated blood pressure: Keep blood pressure log at home. Return in the near future if not controlled.  Patient Instructions (the written plan) was given to the patient.  Orders Placed This Encounter  Procedures  . Pneumococcal conjugate vaccine 13-valent IM     Medicare Attestation I have personally reviewed: The patient's medical and social history Their use of alcohol, tobacco or illicit drugs Their current medications and supplements The patient's functional ability including ADLs,fall risks, home safety risks, cognitive, and hearing and visual impairment Diet and physical activities Evidence for depression or mood disorders  The patient's weight, height, BMI, and visual acuity have been recorded in the chart.  I have made referrals, counseling, and provided education to the patient based on review of the above and I have provided the patient with a written personalized care plan for preventive services.     Lynne Leader, MD   06/10/2016

## 2016-06-12 ENCOUNTER — Encounter: Payer: Self-pay | Admitting: Podiatry

## 2016-06-12 ENCOUNTER — Encounter: Payer: Self-pay | Admitting: Family Medicine

## 2016-06-12 ENCOUNTER — Ambulatory Visit (INDEPENDENT_AMBULATORY_CARE_PROVIDER_SITE_OTHER): Payer: Medicare Other | Admitting: Podiatry

## 2016-06-12 VITALS — Ht 71.0 in | Wt 311.0 lb

## 2016-06-12 DIAGNOSIS — L600 Ingrowing nail: Secondary | ICD-10-CM | POA: Diagnosis not present

## 2016-06-12 DIAGNOSIS — K5730 Diverticulosis of large intestine without perforation or abscess without bleeding: Secondary | ICD-10-CM | POA: Insufficient documentation

## 2016-06-12 DIAGNOSIS — L03012 Cellulitis of left finger: Secondary | ICD-10-CM

## 2016-06-12 DIAGNOSIS — L03032 Cellulitis of left toe: Secondary | ICD-10-CM | POA: Diagnosis not present

## 2016-06-12 DIAGNOSIS — M79672 Pain in left foot: Secondary | ICD-10-CM

## 2016-06-12 DIAGNOSIS — M79675 Pain in left toe(s): Secondary | ICD-10-CM

## 2016-06-12 HISTORY — DX: Diverticulosis of large intestine without perforation or abscess without bleeding: K57.30

## 2016-06-12 NOTE — Patient Instructions (Signed)
Ingrown nail surgery was done on left great toe medial border. Follow soaking instruction.  Some redness and drainage is expected. Call the office if the area gets feverish with increased redness and drainage. Return in one week.  

## 2016-06-12 NOTE — Progress Notes (Signed)
Ingrown nail on left great toe medial border that need be corrected. Been having problem for over 20 years.  SUBJECTIVE: 65 y.o. year old male presents with ingrown left great toe. He has had ingrown nail problem for over 20-30 years. Now he wants it fixed for good.   REVIEW OF SYSTEMS: A comprehensive review of systems was negative except for: chief complaints.   OBJECTIVE: DERMATOLOGIC EXAMINATION: Draining nail border with excess skin over gross, encroaching over nail plate at medial border left great toe. Thick and irregular nail plate on right great toe.   VASCULAR EXAMINATION OF LOWER LIMBS: Pedal pulses: All pedal pulses are palpable with normal pulsation.  No associated edema or proximal cellulitis with the left ingrown nail. Temperature gradient from tibial crest to dorsum of foot is within normal bilateral.  NEUROLOGIC EXAMINATION OF THE LOWER LIMBS: All epicritic and tactile sensations grossly intact.   MUSCULOSKELETAL EXAMINATION: No gross deformities noted.   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  PLAN: Reviewed findings and available treatment options. As per request, Phenol and Alcohol Matrixectomy done on medial border of left great toe under local anesthetics. Procedure done as follow: Affected left great toe was anesthetized with total 52ml mixture of 50/50 0.5% Marcaine plain and 1% Xylocaine plain. Affected left great toe nail medial border was reflected about 4 mm with a nail elevator and excised with nail nipper. Proximal nail matrix tissue was cauterized with Phenol soaked cotton applicator x 4 and neutralized with Alcohol soaked cotton applicator. The wound was dressed with Amerigel ointment dressing. Home care instructions with supply pack dispensed.  Return in 1 week for follow up.

## 2016-06-16 ENCOUNTER — Ambulatory Visit: Payer: Self-pay | Admitting: Podiatry

## 2016-06-19 ENCOUNTER — Encounter: Payer: Self-pay | Admitting: Podiatry

## 2016-06-19 ENCOUNTER — Ambulatory Visit (INDEPENDENT_AMBULATORY_CARE_PROVIDER_SITE_OTHER): Payer: Medicare Other | Admitting: Podiatry

## 2016-06-19 DIAGNOSIS — Z9889 Other specified postprocedural states: Secondary | ICD-10-CM

## 2016-06-19 NOTE — Progress Notes (Signed)
1 week post op left great toe medial border. Clean and dry.  May keep it covered during the day and leave it open at night. Continue to soak till the redness and pain subside.  Return as needed.

## 2016-06-19 NOTE — Patient Instructions (Signed)
1 week post op left great toe medial border. Clean and dry.  May keep it covered during the day and leave it open at night. Continue to soak till the redness and pain subside.  Return as needed.

## 2016-06-20 ENCOUNTER — Encounter: Payer: Self-pay | Admitting: Family Medicine

## 2016-06-26 ENCOUNTER — Encounter: Payer: Self-pay | Admitting: Family Medicine

## 2016-06-26 ENCOUNTER — Other Ambulatory Visit: Payer: Self-pay | Admitting: Family Medicine

## 2016-06-26 DIAGNOSIS — I10 Essential (primary) hypertension: Secondary | ICD-10-CM

## 2016-06-27 MED ORDER — AMLODIPINE BESYLATE 10 MG PO TABS: 10.0000 mg | ORAL_TABLET | Freq: Every day | ORAL | 1 refills | Status: DC

## 2016-06-27 MED ORDER — TAMSULOSIN HCL 0.4 MG PO CAPS: 0.4000 mg | ORAL_CAPSULE | Freq: Every day | ORAL | 1 refills | Status: DC

## 2016-06-30 MED ORDER — SIMVASTATIN 40 MG PO TABS: 40.0000 mg | ORAL_TABLET | Freq: Every day | ORAL | 3 refills | Status: DC

## 2016-08-13 ENCOUNTER — Ambulatory Visit: Payer: Medicare Other | Admitting: Family Medicine

## 2016-08-14 ENCOUNTER — Ambulatory Visit: Payer: Medicare Other | Admitting: Family Medicine

## 2016-09-25 ENCOUNTER — Encounter: Payer: Self-pay | Admitting: Family Medicine

## 2016-09-25 ENCOUNTER — Ambulatory Visit (INDEPENDENT_AMBULATORY_CARE_PROVIDER_SITE_OTHER): Payer: Medicare Other | Admitting: Family Medicine

## 2016-09-25 ENCOUNTER — Ambulatory Visit (INDEPENDENT_AMBULATORY_CARE_PROVIDER_SITE_OTHER): Payer: Medicare Other

## 2016-09-25 VITALS — BP 155/83 | HR 86 | Wt 317.0 lb

## 2016-09-25 DIAGNOSIS — M25562 Pain in left knee: Secondary | ICD-10-CM

## 2016-09-25 DIAGNOSIS — M25561 Pain in right knee: Secondary | ICD-10-CM

## 2016-09-25 DIAGNOSIS — M1712 Unilateral primary osteoarthritis, left knee: Secondary | ICD-10-CM | POA: Diagnosis not present

## 2016-09-25 DIAGNOSIS — I10 Essential (primary) hypertension: Secondary | ICD-10-CM | POA: Diagnosis not present

## 2016-09-25 DIAGNOSIS — Z9889 Other specified postprocedural states: Secondary | ICD-10-CM | POA: Diagnosis not present

## 2016-09-25 MED ORDER — DICLOFENAC SODIUM 1 % TD GEL: 4.0000 g | Freq: Four times a day (QID) | TRANSDERMAL | 11 refills | Status: DC

## 2016-09-25 MED ORDER — DICLOFENAC SODIUM 1 % TD GEL: 2.0000 g | Freq: Four times a day (QID) | TRANSDERMAL | 11 refills | Status: DC

## 2016-09-25 MED ORDER — LISINOPRIL 10 MG PO TABS: 10.0000 mg | ORAL_TABLET | Freq: Every day | ORAL | 0 refills | Status: DC

## 2016-09-25 NOTE — Progress Notes (Signed)
John Morrow is a 66 y.o. male who presents to [facility] today for bilateral knee pain.  Knee pain: Bilateral and aching for several months. It occurs briefly when standing up from a seated position or going up stairs. Pain is located throughout both knees and not localizable. Pain has been stable over this period. Other than a right ACL tear requiring repair several years ago, he has had no knee trauma. Denies recent changes in activity, catching, or popping. Intermittent ibuprofen has helped.  Hypertension: He donates platelets regularly and feels that his BPs have not been well controlled.  Past Medical History:  Diagnosis Date  . Hyperlipemia   . Hypertension    Past Surgical History:  Procedure Laterality Date  . KNEE SURGERY     Social History  Substance Use Topics  . Smoking status: Never Smoker  . Smokeless tobacco: Never Used  . Alcohol use Yes     Comment: occassionally     ROS:  As above   Medications: Current Outpatient Prescriptions  Medication Sig Dispense Refill  . amLODipine (NORVASC) 10 MG tablet Take 1 tablet (10 mg total) by mouth daily. 90 tablet 1  . hydrochlorothiazide (HYDRODIURIL) 25 MG tablet     . simvastatin (ZOCOR) 40 MG tablet Take 1 tablet (40 mg total) by mouth daily at 6 PM. 90 tablet 3  . tamsulosin (FLOMAX) 0.4 MG CAPS capsule Take 1 capsule (0.4 mg total) by mouth daily. 90 capsule 1   No current facility-administered medications for this visit.    Allergies  Allergen Reactions  . Morphine And Related     Rash, vomiting, itching      Exam:  BP (!) 155/83   Pulse 86   Wt (!) 317 lb (143.8 kg)   BMI 44.21 kg/m  General: Well Developed, well nourished, and in no acute distress.  Neuro/Psych: Alert and oriented x3, extra-ocular muscles intact, able to move all 4 extremities, sensation grossly intact. Skin: Warm and dry, no rashes noted.  Respiratory: Not using accessory muscles,  speaking in full sentences, trachea midline.  Cardiovascular: Pulses palpable, no extremity edema. Abdomen: Does not appear distended. MSK: Knees normal appearing bilaterally and nontender to palpation throughout. Right knee with some crepitus on extension. No ligamentous instability and negative McMurray's tests bilaterally. Pain reproduced in bilateral knees when rising from chair.   No results found for this or any previous visit (from the past 48 hour(s)). No results found.   Right knee x-ray 09/25/16: IMPRESSION: 1. Left ACL repair.  Anatomic bony alignment. 2. Corticated lucency noted in the lateral aspect of the tibial metaphysis. This may be postsurgical, clinical correlation suggested. No evidence of fracture or dislocation. If symptoms persist bone scan can be obtained . 3. Tricompartment degenerative change.  Left knee x-ray 09/25/16: IMPRESSION: No acute fracture or subluxation. Osteoarthritic changes as described above.   Procedure: Real-time Ultrasound Guided Injection of right knee  Device: GE Logiq E  Images permanently stored and available for review in the ultrasound unit. Verbal informed consent obtained. Discussed risks and benefits of procedure. Warned about infection bleeding damage to structures skin hypopigmentation and fat atrophy among others. Patient expresses understanding and agreement Time-out conducted.  Noted no overlying erythema, induration, or other signs of local infection.  Skin prepped in a sterile fashion.  Local anesthesia: Topical Ethyl chloride.  With sterile technique and under real time ultrasound guidance: 80 mg of Kenalog and 4 mL Marcaine injected easily.  Completed  without difficulty  Pain immediately resolved suggesting accurate placement of the medication.  Advised to call if fevers/chills, erythema, induration, drainage, or persistent bleeding.  Images permanently stored and available for review in the ultrasound unit.    Impression: Technically successful ultrasound guided injection.  Procedure: Real-time Ultrasound Guided Injection of left knee  Device: GE Logiq E  Images permanently stored and available for review in the ultrasound unit. Verbal informed consent obtained. Discussed risks and benefits of procedure. Warned about infection bleeding damage to structures skin hypopigmentation and fat atrophy among others. Patient expresses understanding and agreement Time-out conducted.  Noted no overlying erythema, induration, or other signs of local infection.  Skin prepped in a sterile fashion.  Local anesthesia: Topical Ethyl chloride.  With sterile technique and under real time ultrasound guidance: 80 mg of Kenalog and 4 mL of Marcaine injected easily.  Completed without difficulty  Pain immediately resolved suggesting accurate placement of the medication.  Advised to call if fevers/chills, erythema, induration, drainage, or persistent bleeding.  Images permanently stored and available for review in the ultrasound unit.  Impression: Technically successful ultrasound guided injection.    Assessment and Plan: 66 y.o. male with hypertension and bilateral knee pain.  Bilateral knee pain: Osteoarthritic changes noted on x-ray.  - Bilateral steroid injections today - Voltaren gel, weight loss, straight leg raises  Hypertension: Elevated BP in office today, likely reflective of baseline per patient. - Start lisinopril 10 mg - Continue thiazide 25 mg and amlodpine 10 mg.  Follow up 1 month  Orders Placed This Encounter  Procedures  . DG Knee Complete 4 Views Left    Please include patellar sunrise, lateral, and weightbearing bilateral AP and bilateral rosenberg views    Standing Status:   Future    Number of Occurrences:   1    Standing Expiration Date:   11/25/2017    Order Specific Question:   Reason for exam:    Answer:   Please include patellar sunrise, lateral, and weightbearing  bilateral AP and bilateral rosenberg views    Comments:   Please include patellar sunrise, lateral, and weightbearing bilateral AP and bilateral rosenberg views    Order Specific Question:   Preferred imaging location?    Answer:   [facility]  . DG Knee Complete 4 Views Right    Please include patellar sunrise, lateral, and weightbearing bilateral AP and bilateral rosenberg views    Standing Status:   Future    Number of Occurrences:   1    Standing Expiration Date:   11/25/2017    Order Specific Question:   Reason for exam:    Answer:   Please include patellar sunrise, lateral, and weightbearing bilateral AP and bilateral rosenberg views    Comments:   Please include patellar sunrise, lateral, and weightbearing bilateral AP and bilateral rosenberg views    Order Specific Question:   Preferred imaging location?    Answer:   [facility]    Discussed warning signs or symptoms. Please see discharge instructions. Patient expresses understanding.

## 2016-09-25 NOTE — Patient Instructions (Addendum)
Thank you for coming in today. Add lisinopril to the blood pressure medicines.  Recheck in 1 month.  Use voltaren gel up to 4 x daily.  Use ibuprofen sparingly.   Try to work on weight loss.    Work on straight leg raises.   Exercise bike is very helpful.

## 2016-10-06 ENCOUNTER — Telehealth: Payer: Self-pay | Admitting: *Deleted

## 2016-10-06 NOTE — Telephone Encounter (Signed)
PA submitted through covermymeds [id number]

## 2016-10-27 ENCOUNTER — Ambulatory Visit (INDEPENDENT_AMBULATORY_CARE_PROVIDER_SITE_OTHER): Payer: Medicare Other | Admitting: Family Medicine

## 2016-10-27 ENCOUNTER — Encounter: Payer: Self-pay | Admitting: Family Medicine

## 2016-10-27 VITALS — BP 144/75 | HR 81 | Resp 18 | Wt 308.0 lb

## 2016-10-27 DIAGNOSIS — M25561 Pain in right knee: Secondary | ICD-10-CM | POA: Diagnosis not present

## 2016-10-27 DIAGNOSIS — I10 Essential (primary) hypertension: Secondary | ICD-10-CM | POA: Diagnosis not present

## 2016-10-27 DIAGNOSIS — M25562 Pain in left knee: Secondary | ICD-10-CM | POA: Diagnosis not present

## 2016-10-27 MED ORDER — LISINOPRIL-HYDROCHLOROTHIAZIDE 20-25 MG PO TABS: 1.0000 | ORAL_TABLET | Freq: Every day | ORAL | 0 refills | Status: DC

## 2016-10-27 NOTE — Progress Notes (Signed)
Patient here for BP check; started Lisinopril 09/25/16; no unpleasant side effects; denies chest pain, shortness of breath. Working on weight loss.

## 2016-10-27 NOTE — Progress Notes (Signed)
John Morrow is a 66 y.o. male who presents to [facility]: [facility] today for 51-month follow up HTN and bilateral knee OA.  HTN: Added lisinopril 10 mg to HCTZ and amlodipine at last visit. Home BPs have been around 140s/80s. No cough or lightheadedness. He has also lost a few pounds recently, which he attributes to portion control [place]  Bilateral knee OA: No further pain or other symptoms since steroid injections at last visit. Did not need diclofenac gel. Feels that straight leg raises and exercise have improved his strength and range of motion.  Also denies muscle pain/cramps on simvastatin. Denies straining on urination, trouble with the stream, or feeling of incomplete evacuation on flomax.   Past Medical History:  Diagnosis Date  . Hyperlipemia   . Hypertension    Past Surgical History:  Procedure Laterality Date  . KNEE SURGERY     Social History  Substance Use Topics  . Smoking status: Never Smoker  . Smokeless tobacco: Never Used  . Alcohol use Yes     Comment: occassionally   family history includes Lung disease in his father and mother.  ROS as above:  Medications: Current Outpatient Prescriptions  Medication Sig Dispense Refill  . amLODipine (NORVASC) 10 MG tablet Take 1 tablet (10 mg total) by mouth daily. 90 tablet 1  . diclofenac sodium (VOLTAREN) 1 % GEL Apply 4 g topically 4 (four) times daily. To affected joint. 100 g 11  . lisinopril-hydrochlorothiazide (PRINZIDE,ZESTORETIC) 20-25 MG tablet Take 1 tablet by mouth daily. 90 tablet 0  . simvastatin (ZOCOR) 40 MG tablet Take 1 tablet (40 mg total) by mouth daily at 6 PM. 90 tablet 3  . tamsulosin (FLOMAX) 0.4 MG CAPS capsule Take 1 capsule (0.4 mg total) by mouth daily. 90 capsule 1   No current facility-administered medications for this visit.    Allergies    Allergen Reactions  . Morphine And Related     Rash, vomiting, itching     Health Maintenance Health Maintenance  Topic Date Due  . PNA vac Low Risk Adult (2 of 2 - PPSV23) 06/10/2017  . COLONOSCOPY  11/06/2020  . TETANUS/TDAP  05/29/2026  . INFLUENZA VACCINE  Completed  . ZOSTAVAX  Completed  . Hepatitis C Screening  Completed     Exam:  BP (!) 144/75 (BP Location: Right Leg, Patient Position: Sitting, Cuff Size: Large)   Pulse 81   Resp 18   Wt (!) 308 lb (139.7 kg)   BMI 42.96 kg/m  Gen: Well NAD HEENT: EOMI,  MMM Lungs: Normal work of breathing. CTABL Heart: RRR no MRG Abd: NABS, Soft. Nondistended, Nontender Exts: Brisk capillary refill, warm and well perfused.  MSK: Knees normal-appearing bilaterally, nontender to palpation with some crepitus on extension.  No results found for this or any previous visit (from the past 72 hour(s)). No results found.    Assessment and Plan: 66 y.o. male with:  HTN: Tolerating lisinopril well. BPs improved but still above goal.  - Switch from lisinopril 10 and HCTZ 25 to combo lisinopril 20 / HCTZ 25. - Continue diet and exercise changes - Follow up in 1 month; check BMP then  Bilateral knee OA: Symptoms resolved for now after steroid injections. - Continue exercises   No orders of the defined types were placed in this encounter.  Meds ordered this encounter  Medications  . lisinopril-hydrochlorothiazide (PRINZIDE,ZESTORETIC) 20-25 MG tablet  Sig: Take 1 tablet by mouth daily.    Dispense:  90 tablet    Refill:  0     Discussed warning signs or symptoms. Please see discharge instructions. Patient expresses understanding.

## 2016-10-27 NOTE — Patient Instructions (Signed)
Thank you for coming in today. STOP individual lisinopril and HCTZ START combo Lisinopril/HCTZ 20/25.   Recheck in about 1 month. We will check kidney labs then.

## 2016-11-18 ENCOUNTER — Encounter: Payer: Self-pay | Admitting: Family Medicine

## 2016-11-21 NOTE — Telephone Encounter (Signed)
Upon f/u, patient didn't   nedd diclofenac. See progress note

## 2016-11-24 ENCOUNTER — Ambulatory Visit: Payer: Medicare Other | Admitting: Family Medicine

## 2017-01-11 ENCOUNTER — Other Ambulatory Visit: Payer: Self-pay | Admitting: Family Medicine

## 2017-01-12 NOTE — Telephone Encounter (Signed)
Patient is due for Blood pressure f/u

## 2017-01-26 ENCOUNTER — Other Ambulatory Visit: Payer: Self-pay | Admitting: Family Medicine

## 2017-01-28 NOTE — Telephone Encounter (Signed)
Pt needs to return for labs and blood pressure recheck

## 2017-01-28 NOTE — Telephone Encounter (Signed)
Pt informed. Pt expressed understanding and is agreeable. Harryette Shuart CMA, RT 

## 2017-01-29 ENCOUNTER — Telehealth: Payer: Self-pay | Admitting: Family Medicine

## 2017-01-29 DIAGNOSIS — I10 Essential (primary) hypertension: Secondary | ICD-10-CM

## 2017-01-29 NOTE — Telephone Encounter (Signed)
Pt needs to get labs done for OV with PCP.

## 2017-02-02 ENCOUNTER — Ambulatory Visit: Payer: Medicare Other | Admitting: Family Medicine

## 2017-02-03 LAB — COMPLETE METABOLIC PANEL WITH GFR
ALT: 18 U/L (ref 9–46)
AST: 18 U/L (ref 10–35)
Albumin: 4 g/dL (ref 3.6–5.1)
Alkaline Phosphatase: 53 U/L (ref 40–115)
BUN: 22 mg/dL (ref 7–25)
CALCIUM: 9.1 mg/dL (ref 8.6–10.3)
CHLORIDE: 107 mmol/L (ref 98–110)
CO2: 29 mmol/L (ref 20–31)
Creat: 1.15 mg/dL (ref 0.70–1.25)
GFR, Est African American: 76 mL/min (ref >=60)
GFR, Est Non African American: 65 mL/min (ref >=60)
Glucose, Bld: 89 mg/dL (ref 65–99)
POTASSIUM: 4.1 mmol/L (ref 3.5–5.3)
Sodium: 143 mmol/L (ref 135–146)
Total Bilirubin: 0.6 mg/dL (ref 0.2–1.2)
Total Protein: 6.5 g/dL (ref 6.1–8.1)

## 2017-02-04 ENCOUNTER — Encounter: Payer: Self-pay | Admitting: Family Medicine

## 2017-02-04 ENCOUNTER — Ambulatory Visit (INDEPENDENT_AMBULATORY_CARE_PROVIDER_SITE_OTHER): Payer: Medicare Other | Admitting: Family Medicine

## 2017-02-04 VITALS — BP 121/79 | HR 73 | Wt 314.0 lb

## 2017-02-04 DIAGNOSIS — I10 Essential (primary) hypertension: Secondary | ICD-10-CM | POA: Diagnosis not present

## 2017-02-04 MED ORDER — TAMSULOSIN HCL 0.4 MG PO CAPS: 0.4000 mg | ORAL_CAPSULE | Freq: Every day | ORAL | 3 refills | Status: DC

## 2017-02-04 MED ORDER — LISINOPRIL-HYDROCHLOROTHIAZIDE 20-25 MG PO TABS: 1.0000 | ORAL_TABLET | Freq: Every day | ORAL | 3 refills | Status: DC

## 2017-02-04 NOTE — Progress Notes (Signed)
John Morrow is a 66 y.o. male who presents to [facility] today for follow-up hypertension. Patient was seen last month for hypertension and started on an increased dose of lisinopril/hydrochlorothiazide. He also takes amlodipine. He feels well with no chest pain palpitations shortness of breath or lightheadedness or dizziness. He feels well.   Past Medical History:  Diagnosis Date  . Hyperlipemia   . Hypertension    Past Surgical History:  Procedure Laterality Date  . KNEE SURGERY     Social History  Substance Use Topics  . Smoking status: Never Smoker  . Smokeless tobacco: Never Used  . Alcohol use Yes     Comment: occassionally   family history includes Lung disease in his father and mother.  ROS as above:  Medications: Current Outpatient Prescriptions  Medication Sig Dispense Refill  . amLODipine (NORVASC) 10 MG tablet Take 1 tablet (10 mg total) by mouth daily. 90 tablet 1  . lisinopril-hydrochlorothiazide (PRINZIDE,ZESTORETIC) 20-25 MG tablet Take 1 tablet by mouth daily. 90 tablet 3  . simvastatin (ZOCOR) 40 MG tablet Take 1 tablet (40 mg total) by mouth daily at 6 PM. 90 tablet 3  . tamsulosin (FLOMAX) 0.4 MG CAPS capsule Take 1 capsule (0.4 mg total) by mouth daily. 90 capsule 3   No current facility-administered medications for this visit.    Allergies  Allergen Reactions  . Morphine And Related     Rash, vomiting, itching     Health Maintenance Health Maintenance  Topic Date Due  . INFLUENZA VACCINE  04/15/2017  . PNA vac Low Risk Adult (2 of 2 - PPSV23) 06/10/2017  . COLONOSCOPY  11/06/2020  . TETANUS/TDAP  05/29/2026  . Hepatitis C Screening  Completed     Exam:  BP 121/79   Pulse 73   Wt (!) 314 lb (142.4 kg)   BMI 43.79 kg/m  Gen: Well NAD HEENT: EOMI,  MMM Lungs: Normal work of breathing. CTABL Heart: RRR no  MRG Abd: NABS, Soft. Nondistended, Nontender Exts: Brisk capillary refill, warm and well perfused.    Results for orders placed or performed in visit on 01/29/17 (from the past 72 hour(s))  COMPLETE METABOLIC PANEL WITH GFR     Status: None   Collection Time: 02/02/17  9:24 AM  Result Value Ref Range   Sodium 143 135 - 146 mmol/L   Potassium 4.1 3.5 - 5.3 mmol/L   Chloride 107 98 - 110 mmol/L   CO2 29 20 - 31 mmol/L   Glucose, Bld 89 65 - 99 mg/dL   BUN 22 7 - 25 mg/dL   Creat 1.15 0.70 - 1.25 mg/dL    Comment:   For patients > or = 67 years of age: The upper reference limit for Creatinine is approximately 13% higher for people identified as African-American.      Total Bilirubin 0.6 0.2 - 1.2 mg/dL   Alkaline Phosphatase 53 40 - 115 U/L   AST 18 10 - 35 U/L   ALT 18 9 - 46 U/L   Total Protein 6.5 6.1 - 8.1 g/dL   Albumin 4.0 3.6 - 5.1 g/dL   Calcium 9.1 8.6 - 10.3 mg/dL   GFR, Est African American 76 >=60 mL/min   GFR, Est Non African American 65 >=60 mL/min   No results found.    Assessment and Plan: 66 y.o. male with Retention at goal. Plan to continue current regimen. Medications refilled. Recheck in  4-6 months for wellness exam.   No orders of the defined types were placed in this encounter.  Meds ordered this encounter  Medications  . lisinopril-hydrochlorothiazide (PRINZIDE,ZESTORETIC) 20-25 MG tablet    Sig: Take 1 tablet by mouth daily.    Dispense:  90 tablet    Refill:  3  . tamsulosin (FLOMAX) 0.4 MG CAPS capsule    Sig: Take 1 capsule (0.4 mg total) by mouth daily.    Dispense:  90 capsule    Refill:  3     Discussed warning signs or symptoms. Please see discharge instructions. Patient expresses understanding.

## 2017-02-04 NOTE — Patient Instructions (Addendum)
Thank you for coming in today. Return in the fall sometime for a well exam.  Keep exercising and reduce carbs in your diet.   Keep taking the medicine.   Recheck in about 4-6 months.

## 2017-03-01 ENCOUNTER — Encounter: Payer: Self-pay | Admitting: Family Medicine

## 2017-03-02 ENCOUNTER — Ambulatory Visit (INDEPENDENT_AMBULATORY_CARE_PROVIDER_SITE_OTHER): Payer: Medicare Other

## 2017-03-02 ENCOUNTER — Ambulatory Visit (HOSPITAL_BASED_OUTPATIENT_CLINIC_OR_DEPARTMENT_OTHER): Payer: Medicare Other

## 2017-03-02 ENCOUNTER — Ambulatory Visit (INDEPENDENT_AMBULATORY_CARE_PROVIDER_SITE_OTHER): Payer: Medicare Other | Admitting: Family Medicine

## 2017-03-02 ENCOUNTER — Ambulatory Visit (HOSPITAL_BASED_OUTPATIENT_CLINIC_OR_DEPARTMENT_OTHER)
Admission: RE | Admit: 2017-03-02 | Discharge: 2017-03-02 | Disposition: A | Discharge status: Home / Self Care | Payer: Medicare Other | Source: Ambulatory Visit | Attending: Family Medicine | Admitting: Family Medicine

## 2017-03-02 ENCOUNTER — Encounter: Payer: Self-pay | Admitting: Family Medicine

## 2017-03-02 VITALS — BP 141/86 | HR 79 | Wt 320.0 lb

## 2017-03-02 DIAGNOSIS — M7731 Calcaneal spur, right foot: Secondary | ICD-10-CM

## 2017-03-02 DIAGNOSIS — M79671 Pain in right foot: Secondary | ICD-10-CM

## 2017-03-02 DIAGNOSIS — M25871 Other specified joint disorders, right ankle and foot: Secondary | ICD-10-CM

## 2017-03-02 DIAGNOSIS — M7989 Other specified soft tissue disorders: Secondary | ICD-10-CM

## 2017-03-02 DIAGNOSIS — R609 Edema, unspecified: Secondary | ICD-10-CM | POA: Diagnosis not present

## 2017-03-02 MED ORDER — FUROSEMIDE 20 MG PO TABS: 20.0000 mg | ORAL_TABLET | Freq: Every day | ORAL | 1 refills | Status: DC

## 2017-03-02 NOTE — Patient Instructions (Addendum)
Thank you for coming in today. For foot pain reduce activity a bit guided by pain.   For leg swelling we are doing an ultrasound today or tomorrow.   We are also going to start lasix daily for fluid retention.   Recheck in about 1 month.   Return sooner if needed.   Ultrasound will be at the [facility]  330pm Address: [address] [place], [place] [address] [phone]  Furosemide tablets What is this medicine? FUROSEMIDE (fyoor OH se mide) is a diuretic. It helps you make more urine and to lose salt and excess water from your body. This medicine is used to treat high blood pressure, and edema or swelling from heart, kidney, or liver disease. This medicine may be used for other purposes; ask your health care provider or pharmacist if you have questions. COMMON BRAND NAME(S): Active-Medicated Specimen Kit, Delone, Diuscreen, Lasix, RX Specimen Collection Kit, Specimen Collection Kit, URINX Medicated Specimen Collection What should I tell my health care provider before I take this medicine? They need to know if you have any of these conditions: -abnormal blood electrolytes -diarrhea or vomiting -gout -heart disease -kidney disease, small amounts of urine, or difficulty passing urine -liver disease -thyroid disease -an unusual or allergic reaction to furosemide, sulfa drugs, other medicines, foods, dyes, or preservatives -pregnant or trying to get pregnant -breast-feeding How should I use this medicine? Take this medicine by mouth with a glass of water. Follow the directions on the prescription label. You may take this medicine with or without food. If it upsets your stomach, take it with food or milk. Do not take your medicine more often than directed. Remember that you will need to pass more urine after taking this medicine. Do not take your medicine at a time of day that will cause you problems. Do not take at bedtime. Talk to your pediatrician regarding the use of  this medicine in children. While this drug may be prescribed for selected conditions, precautions do apply. Overdosage: If you think you have taken too much of this medicine contact a poison control center or emergency room at once. NOTE: This medicine is only for you. Do not share this medicine with others. What if I miss a dose? If you miss a dose, take it as soon as you can. If it is almost time for your next dose, take only that dose. Do not take double or extra doses. What may interact with this medicine? -aspirin and aspirin-like medicines -certain antibiotics -chloral hydrate -cisplatin -cyclosporine -digoxin -diuretics -laxatives -lithium -medicines for blood pressure -medicines that relax muscles for surgery -methotrexate -NSAIDs, medicines for pain and inflammation like ibuprofen, naproxen, or indomethacin -phenytoin -steroid medicines like prednisone or cortisone -sucralfate -thyroid hormones This list may not describe all possible interactions. Give your health care provider a list of all the medicines, herbs, non-prescription drugs, or dietary supplements you use. Also tell them if you smoke, drink alcohol, or use illegal drugs. Some items may interact with your medicine. What should I watch for while using this medicine? Visit your doctor or health care professional for regular checks on your progress. Check your blood pressure regularly. Ask your doctor or health care professional what your blood pressure should be, and when you should contact him or her. If you are a diabetic, check your blood sugar as directed. You may need to be on a special diet while taking this medicine. Check with your doctor. Also, ask how many glasses of fluid  you need to drink a day. You must not get dehydrated. You may get drowsy or dizzy. Do not drive, use machinery, or do anything that needs mental alertness until you know how this drug affects you. Do not stand or sit up quickly, especially if  you are an older patient. This reduces the risk of dizzy or fainting spells. Alcohol can make you more drowsy and dizzy. Avoid alcoholic drinks. This medicine can make you more sensitive to the sun. Keep out of the sun. If you cannot avoid being in the sun, wear protective clothing and use sunscreen. Do not use sun lamps or tanning beds/booths. What side effects may I notice from receiving this medicine? Side effects that you should report to your doctor or health care professional as soon as possible: -blood in urine or stools -dry mouth -fever or chills -hearing loss or ringing in the ears -irregular heartbeat -muscle pain or weakness, cramps -skin rash -stomach upset, pain, or nausea -tingling or numbness in the hands or feet -unusually weak or tired -vomiting or diarrhea -yellowing of the eyes or skin Side effects that usually do not require medical attention (report to your doctor or health care professional if they continue or are bothersome): -headache -loss of appetite -unusual bleeding or bruising This list may not describe all possible side effects. Call your doctor for medical advice about side effects. You may report side effects to FDA at 1-800-FDA-1088. Where should I keep my medicine? Keep out of the reach of children. Store at room temperature between 15 and 30 degrees C (59 and 86 degrees F). Protect from light. Throw away any unused medicine after the expiration date. NOTE: This sheet is a summary. It may not cover all possible information. If you have questions about this medicine, talk to your doctor, pharmacist, or health care provider.  2018 Elsevier/Gold Standard (2014-11-22 13:49:50)

## 2017-03-02 NOTE — Progress Notes (Signed)
John Morrow is a 66 y.o. male who presents to [place]: [place] today for leg swelling and foot pain.   Leg swelling: Patient notes a few day history of bilateral leg swelling right worse than left. He denies any fevers or chills nausea vomiting or diarrhea. He denies any new medications or increased salt intake. He feels well. He denies any recent injury or immobility. He denies any chest pain or shortness of breath. He has noted a little bit a weight gain over the last several days. He denies any orthopnea.  Additionally patient has right foot pain. He has been increasing his activity level a little bit recently. He notes pain is located at the lateral aspect of his forefoot. He notes the pain has been improving a bit as well.   Past Medical History:  Diagnosis Date  . Hyperlipemia   . Hypertension    Past Surgical History:  Procedure Laterality Date  . KNEE SURGERY     Social History  Substance Use Topics  . Smoking status: Never Smoker  . Smokeless tobacco: Never Used  . Alcohol use Yes     Comment: occassionally   family history includes Lung disease in his father and mother.  ROS as above:  Medications: Current Outpatient Prescriptions  Medication Sig Dispense Refill  . amLODipine (NORVASC) 10 MG tablet Take 1 tablet (10 mg total) by mouth daily. 90 tablet 1  . lisinopril-hydrochlorothiazide (PRINZIDE,ZESTORETIC) 20-25 MG tablet Take 1 tablet by mouth daily. 90 tablet 3  . simvastatin (ZOCOR) 40 MG tablet Take 1 tablet (40 mg total) by mouth daily at 6 PM. 90 tablet 3  . tamsulosin (FLOMAX) 0.4 MG CAPS capsule Take 1 capsule (0.4 mg total) by mouth daily. 90 capsule 3  . furosemide (LASIX) 20 MG tablet Take 1 tablet (20 mg total) by mouth daily. 30 tablet 1   No current facility-administered medications for this visit.    Allergies  Allergen Reactions    . Morphine And Related     Rash, vomiting, itching     Health Maintenance Health Maintenance  Topic Date Due  . INFLUENZA VACCINE  04/15/2017  . PNA vac Low Risk Adult (2 of 2 - PPSV23) 06/10/2017  . COLONOSCOPY  11/06/2020  . TETANUS/TDAP  05/29/2026  . Hepatitis C Screening  Completed     Exam:  BP (!) 141/86   Pulse 79   Wt (!) 320 lb (145.2 kg)   BMI 44.63 kg/m   Wt Readings from Last 5 Encounters:  03/02/17 (!) 320 lb (145.2 kg)  02/04/17 (!) 314 lb (142.4 kg)  10/27/16 (!) 308 lb (139.7 kg)  09/25/16 (!) 317 lb (143.8 kg)  06/12/16 (!) 311 lb (141.1 kg)    Gen: Well NAD HEENT: EOMI,  MMM Lungs: Normal work of breathing. CTABL Heart: RRR no MRG Abd: NABS, Soft. Nondistended, Nontender Exts: Brisk capillary refill, warm and well perfused. 1+ pitting edema bilateral lower extremities to shins. Calf diameters appear to be equal MSK: Right foot normal-appearing. Mildly tender to palpation along the midportion of the fourth metatarsal. Normal Foot and ankle motion.     Chemistry      Component Value Date/Time   NA 143 02/02/2017 0924   K 4.1 02/02/2017 0924   CL 107 02/02/2017 0924   CO2 29 02/02/2017 0924   BUN 22 02/02/2017 0924   CREATININE 1.15 02/02/2017 0924      Component Value Date/Time  CALCIUM 9.1 02/02/2017 0924   ALKPHOS 53 02/02/2017 0924   AST 18 02/02/2017 0924   ALT 18 02/02/2017 0924   BILITOT 0.6 02/02/2017 0924      No results found for this or any previous visit (from the past 72 hour(s)). No results found.    Assessment and Plan: 66 y.o. male with  Leg swelling. Doubtful for DVT however his risk is high enough think it's reasonable to check. Plan for ultrasound of his legs bilaterally. Additionally we'll start some Lasix as I think he has a bit of fluid retention. He has some weight gain. Plan to recheck in a month. We'll check metabolic panel at that time.  Foot pain: Suspicious for stress injury. He started feeling better.  Plan to decrease activity and recheck in 1 month.   Orders Placed This Encounter  Procedures  . DG Foot Complete Right    Standing Status:   Future    Number of Occurrences:   1    Standing Expiration Date:   05/02/2018    Order Specific Question:   Reason for Exam (SYMPTOM  OR DIAGNOSIS REQUIRED)    Answer:   eval pain 4th metatarsal    Order Specific Question:   Preferred imaging location?    Answer:   [facility]    Order Specific Question:   Radiology Contrast Protocol - do NOT remove file path    Answer:   [email or web address]  . US Venous Img Lower Bilateral    Standing Status:   Future    Standing Expiration Date:   05/02/2018    Order Specific Question:   Reason for Exam (SYMPTOM  OR DIAGNOSIS REQUIRED)    Answer:   eval leg swelling r>l    Order Specific Question:   Preferred imaging location?    Answer:   [facility]   Meds ordered this encounter  Medications  . DISCONTD: furosemide (LASIX) 20 MG tablet    Sig: Take 1 tablet (20 mg total) by mouth daily.    Dispense:  30 tablet    Refill:  1  . furosemide (LASIX) 20 MG tablet    Sig: Take 1 tablet (20 mg total) by mouth daily.    Dispense:  30 tablet    Refill:  1     Discussed warning signs or symptoms. Please see discharge instructions. Patient expresses understanding.

## 2017-03-04 ENCOUNTER — Other Ambulatory Visit (HOSPITAL_BASED_OUTPATIENT_CLINIC_OR_DEPARTMENT_OTHER): Payer: Medicare Other

## 2017-05-02 ENCOUNTER — Other Ambulatory Visit: Payer: Self-pay | Admitting: Family Medicine

## 2017-05-02 ENCOUNTER — Encounter: Payer: Self-pay | Admitting: Family Medicine

## 2017-05-02 DIAGNOSIS — R609 Edema, unspecified: Secondary | ICD-10-CM

## 2017-05-05 ENCOUNTER — Encounter: Payer: Self-pay | Admitting: Family Medicine

## 2017-05-05 ENCOUNTER — Ambulatory Visit (INDEPENDENT_AMBULATORY_CARE_PROVIDER_SITE_OTHER): Payer: Medicare Other | Admitting: Family Medicine

## 2017-05-05 VITALS — BP 120/82 | HR 84 | Wt 322.0 lb

## 2017-05-05 DIAGNOSIS — N400 Enlarged prostate without lower urinary tract symptoms: Secondary | ICD-10-CM | POA: Diagnosis not present

## 2017-05-05 DIAGNOSIS — R609 Edema, unspecified: Secondary | ICD-10-CM | POA: Diagnosis not present

## 2017-05-05 DIAGNOSIS — E782 Mixed hyperlipidemia: Secondary | ICD-10-CM

## 2017-05-05 DIAGNOSIS — Z23 Encounter for immunization: Secondary | ICD-10-CM | POA: Diagnosis not present

## 2017-05-05 DIAGNOSIS — I10 Essential (primary) hypertension: Secondary | ICD-10-CM | POA: Diagnosis not present

## 2017-05-05 NOTE — Progress Notes (Signed)
John Morrow is a 66 y.o. male who presents to [place]: [place] today for follow-up leg swelling. Patient was seen in June for leg swelling thought to be dependent edema. He had a duplex ultrasound to rule out DVT. He was treated empirically with furosemide. He's had considerable improvement of symptoms since. He notes that he has to urinate frequently after taking the furosemide however.  Hypertension: This is well-controlled with the amlodipine and lisinopril/hydrochlorothiazide. He denies chest pain or palpitations or shortness of breath.  Hyperlipidemia: Patient takes simvastatin daily. He denies significant muscle aches or pain.   Past Medical History:  Diagnosis Date  . Hyperlipemia   . Hypertension    Past Surgical History:  Procedure Laterality Date  . KNEE SURGERY     Social History  Substance Use Topics  . Smoking status: Never Smoker  . Smokeless tobacco: Never Used  . Alcohol use Yes     Comment: occassionally   family history includes Lung disease in his father and mother.  ROS as above:  Medications: Current Outpatient Prescriptions  Medication Sig Dispense Refill  . amLODipine (NORVASC) 10 MG tablet Take 1 tablet (10 mg total) by mouth daily. 90 tablet 1  . furosemide (LASIX) 20 MG tablet TAKE 1 TABLET BY MOUTH ONCE DAILY 30 tablet 1  . lisinopril-hydrochlorothiazide (PRINZIDE,ZESTORETIC) 20-25 MG tablet Take 1 tablet by mouth daily. 90 tablet 3  . simvastatin (ZOCOR) 40 MG tablet Take 1 tablet (40 mg total) by mouth daily at 6 PM. 90 tablet 3  . tamsulosin (FLOMAX) 0.4 MG CAPS capsule Take 1 capsule (0.4 mg total) by mouth daily. 90 capsule 3   No current facility-administered medications for this visit.    Allergies  Allergen Reactions  . Morphine And Related     Rash, vomiting, itching     Health Maintenance Health Maintenance    Topic Date Due  . INFLUENZA VACCINE  04/15/2017  . PNA vac Low Risk Adult (2 of 2 - PPSV23) 06/10/2017  . COLONOSCOPY  11/06/2020  . TETANUS/TDAP  05/29/2026  . Hepatitis C Screening  Completed     Exam:  BP 120/82   Pulse 84   Wt (!) 322 lb (146.1 kg)   BMI 44.91 kg/m   Wt Readings from Last 5 Encounters:  05/05/17 (!) 322 lb (146.1 kg)  03/02/17 (!) 320 lb (145.2 kg)  02/04/17 (!) 314 lb (142.4 kg)  10/27/16 (!) 308 lb (139.7 kg)  09/25/16 (!) 317 lb (143.8 kg)    Gen: Well NAD HEENT: EOMI,  MMM Lungs: Normal work of breathing. CTABL Heart: RRR no MRG Abd: NABS, Soft. Nondistended, Nontender Exts: Brisk capillary refill, warm and well perfused. No edema bilateral lower extremities   No results found for this or any previous visit (from the past 72 hour(s)). No results found.    Assessment and Plan: 66 y.o. male with  Leg edema: Much improved. Continue furosemide. Check metabolic panel in the near future.  Hypertension at goal. Continue current regimen. Also check metabolic panel.  Hyperlipidemia: Doing reasonably well. Will recheck fasting lipids in the near future.  BPH: We'll check PSA as part of labs obtained today. Patient is essentially asymptomatic.  Influenza vaccine given today.   Orders Placed This Encounter  Procedures  . Flu vaccine HIGH DOSE PF  . CBC  . COMPLETE METABOLIC PANEL WITH GFR  . Lipid Panel w/reflex Direct LDL  . PSA   No  orders of the defined types were placed in this encounter.    Discussed warning signs or symptoms. Please see discharge instructions. Patient expresses understanding.

## 2017-05-05 NOTE — Patient Instructions (Signed)
Thank you for coming in today. Get fasting labs in the near future.  We will check for a well exam sometime in late September or early October.  I will get lab results back to you ASAP.  Use compression stockings as needed.

## 2017-05-07 LAB — COMPLETE METABOLIC PANEL WITH GFR
ALK PHOS: 49 U/L (ref 40–115)
ALT: 20 U/L (ref 9–46)
AST: 18 U/L (ref 10–35)
Albumin: 4.1 g/dL (ref 3.6–5.1)
BUN: 22 mg/dL (ref 7–25)
CHLORIDE: 105 mmol/L (ref 98–110)
CO2: 28 mmol/L (ref 20–32)
Calcium: 9.2 mg/dL (ref 8.6–10.3)
Creat: 1.26 mg/dL — ABNORMAL HIGH (ref 0.70–1.25)
GFR, EST NON AFRICAN AMERICAN: 59 mL/min — AB (ref >=60)
GFR, Est African American: 68 mL/min (ref >=60)
GLUCOSE: 103 mg/dL — AB (ref 65–99)
POTASSIUM: 3.6 mmol/L (ref 3.5–5.3)
SODIUM: 141 mmol/L (ref 135–146)
Total Bilirubin: 0.6 mg/dL (ref 0.2–1.2)
Total Protein: 6.4 g/dL (ref 6.1–8.1)

## 2017-05-07 LAB — LIPID PANEL W/REFLEX DIRECT LDL
Cholesterol: 115 mg/dL (ref <200)
HDL: 37 mg/dL — AB (ref >40)
LDL-CHOLESTEROL: 58 mg/dL
NON-HDL CHOLESTEROL (CALC): 78 mg/dL (ref <130)
Total CHOL/HDL Ratio: 3.1 Ratio (ref <5.0)
Triglycerides: 123 mg/dL (ref <150)

## 2017-05-07 LAB — CBC
HCT: 43 % (ref 38.5–50.0)
HEMOGLOBIN: 14.2 g/dL (ref 13.2–17.1)
MCH: 29.2 pg (ref 27.0–33.0)
MCHC: 33 g/dL (ref 32.0–36.0)
MCV: 88.3 fL (ref 80.0–100.0)
MPV: 10.1 fL (ref 7.5–12.5)
PLATELETS: 205 10*3/uL (ref 140–400)
RBC: 4.87 MIL/uL (ref 4.20–5.80)
RDW: 13 % (ref 11.0–15.0)
WBC: 5.8 10*3/uL (ref 3.8–10.8)

## 2017-05-08 LAB — PSA: PSA: 1.2 ng/mL (ref <=4.0)

## 2017-05-08 MED ORDER — FUROSEMIDE 20 MG PO TABS: 20.0000 mg | ORAL_TABLET | Freq: Every day | ORAL | 1 refills | Status: DC

## 2017-05-08 MED ORDER — SIMVASTATIN 40 MG PO TABS: 40.0000 mg | ORAL_TABLET | Freq: Every day | ORAL | 3 refills | Status: DC

## 2017-05-08 MED ORDER — AMLODIPINE BESYLATE 10 MG PO TABS: 10.0000 mg | ORAL_TABLET | Freq: Every day | ORAL | 1 refills | Status: DC

## 2017-05-08 NOTE — Addendum Note (Signed)
Addended by: Gregor Hams on: 05/08/2017 07:48 AM   Modules accepted: Orders

## 2017-06-17 ENCOUNTER — Ambulatory Visit (INDEPENDENT_AMBULATORY_CARE_PROVIDER_SITE_OTHER): Payer: Medicare Other | Admitting: Family Medicine

## 2017-06-17 ENCOUNTER — Encounter: Payer: Self-pay | Admitting: Family Medicine

## 2017-06-17 VITALS — BP 101/78 | HR 88 | Ht 71.0 in | Wt 321.0 lb

## 2017-06-17 DIAGNOSIS — M7581 Other shoulder lesions, right shoulder: Secondary | ICD-10-CM | POA: Diagnosis not present

## 2017-06-17 DIAGNOSIS — Z23 Encounter for immunization: Secondary | ICD-10-CM | POA: Diagnosis not present

## 2017-06-17 DIAGNOSIS — Z0000 Encounter for general adult medical examination without abnormal findings: Secondary | ICD-10-CM | POA: Diagnosis not present

## 2017-06-17 NOTE — Progress Notes (Signed)
. 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  Patient notes right shoulder pain ongoing now for several years. The pain is located in the right lateral upper arm worse with reaching up reaching back and at bedtime. He denies any radiating pain weakness or numbness. He has not tried any treatment yet.  Past medical, surgical, social and family history reviewed:  Patient Active Problem List   Diagnosis Date Noted  . Dependent edema 05/05/2017  . Bilateral knee pain 09/25/2016  . Diverticulosis of colon without hemorrhage 06/12/2016  . BPH (benign prostatic hyperplasia) 05/29/2016  . Urine frequency 05/29/2016  . HTN (hypertension) 05/29/2016  . HLD (hyperlipidemia) 05/29/2016  . Obese 05/29/2016    Past Surgical History:  Procedure Laterality Date  . KNEE SURGERY      Social History   Social History  . Marital status: Married    Spouse name: N/A  . Number of children: N/A  . Years of education: N/A   Occupational History  . Not on file.   Social History Main Topics  . Smoking status: Never Smoker  . Smokeless tobacco: Never Used  . Alcohol use Yes     Comment: occassionally  . Drug use: No  . Sexual activity: Yes    Partners: Female   Other Topics Concern  . Not on file   Social History Narrative  . No narrative on file    Family History  Problem Relation Age of Onset  . Lung disease Mother   . Lung disease Father      Current medication list and allergy/intolerance information reviewed:    Outpatient Encounter Prescriptions as of 06/17/2017  Medication Sig  . amLODipine (NORVASC) 10 MG tablet Take 1 tablet (10 mg total) by mouth daily.  . furosemide (LASIX) 20 MG tablet Take 1 tablet (20 mg total) by mouth daily.  Marland Kitchen lisinopril-hydrochlorothiazide (PRINZIDE,ZESTORETIC) 20-25 MG  tablet Take 1 tablet by mouth daily.  . simvastatin (ZOCOR) 40 MG tablet Take 1 tablet (40 mg total) by mouth daily at 6 PM.  . tamsulosin (FLOMAX) 0.4 MG CAPS capsule Take 1 capsule (0.4 mg total) by mouth daily.   No facility-administered encounter medications on file as of 06/17/2017.     Allergies  Allergen Reactions  . Morphine And Related     Rash, vomiting, itching        Review of Systems: No headache, visual changes, nausea, vomiting, diarrhea, constipation, dizziness, abdominal pain, skin rash, fevers, chills, night sweats, weight loss, swollen lymph nodes, body aches, joint swelling, muscle aches, chest pain, shortness of breath, mood changes, visual or auditory hallucinations.     Medicare Wellness Questionnaire  Are there smokers in your home (other than you)? no  Depression screen [facility] 2/9 05/05/2017 02/04/2017 06/10/2016  Decreased Interest 0 0 0  Down, Depressed, Hopeless 0 0 0  PHQ - 2 Score 0 0 0        Activities of Daily Living In your present state of health, do you have any difficulty performing the following activities?:  Driving? no Managing money?  no Feeding yourself? no Getting from bed to chair? no Climbing a flight of stairs? no Preparing food and eating?: no Bathing or showering? no Getting dressed: no Getting to the toilet? no Using the toilet: no Moving around from place to place: no In the past  year have you fallen or had a near fall?: no  Hearing Difficulties:  Do you often ask people to speak up or repeat themselves? no Do you experience ringing or noises in your ears? no  Do you have difficulty understanding soft or whispered voices? no  Memory Difficulties:  Do you feel that you have a problem with memory? no  Do you often misplace items? no  Do you feel safe at home?  yes  Sexual Health:   Are you sexually active?  Yes  Do you have more than one partner?  No   Risk Factors  Current exercise habits: Daily  walking  Dietary issues discussed:Lower carb diet  Cardiac risk factors: Present   Exam:  BP 101/78   Pulse 88   Ht 5\' 11"  (1.803 m)   Wt (!) 321 lb (145.6 kg)   BMI 44.77 kg/m   Wt Readings from Last 5 Encounters:  06/17/17 (!) 321 lb (145.6 kg)  05/05/17 (!) 322 lb (146.1 kg)  03/02/17 (!) 320 lb (145.2 kg)  02/04/17 (!) 314 lb (142.4 kg)  10/27/16 (!) 308 lb (139.7 kg)    Vision by Snellen chart: right eye:see nurse notes, left eye:see nurse notes  Constitutional: VS see above. General Appearance: alert, well-developed, well-nourished, NAD  Ears, Nose, Mouth, Throat: MMM  Neck: No masses, trachea midline.   Respiratory: Normal respiratory effort. no wheeze, no rhonchi, no rales  Cardiovascular:No lower extremity edema.   Musculoskeletal: Gait normal. No clubbing/cyanosis of digits.  Right shoulder normal-appearing nontender. Range of motion full blood pain present with abduction beyond 100. Positive Hawkins and Neer status. Positive empty can test. Strength is intact. Pulses capillary refill and sensation are intact distally.  Neurological: Normal balance/coordination. No tremor. Recalls 3 objects and able to read face of watch with correct time.   Skin: warm, dry, intact. No rash/ulcer.   Psychiatric: Normal judgment/insight. Normal mood and affect. Oriented x3.     ASSESSMENT/PLAN:   Encounter for Medicare annual wellness exam  Doing well. Discussed weight loss and laboratory results. Continue current medical regimen.  For right shoulder plan for home physical therapy exercises for rotator cuff tendinitis. If not better will recheck. Repeat visit in about 3 months.   Health Maintenance Health Maintenance  Topic Date Due  . PNA vac Low Risk Adult (2 of 2 - PPSV23) 06/10/2017  . COLONOSCOPY  11/06/2020  . TETANUS/TDAP  05/29/2026  . INFLUENZA VACCINE  Completed  . Hepatitis C Screening  Completed    Immunization History  Administered Date(s)  Administered  . Influenza, High Dose Seasonal PF 05/05/2017  . Influenza,inj,Quad PF,6+ Mos 05/29/2016  . Pneumococcal Conjugate-13 06/10/2016  . Tdap 05/29/2016  . Zoster 01/17/2012  . Zoster Recombinat (Shingrix) 01/30/2017     During the course of the visit the patient was educated and counseled about appropriate screening and preventive services as noted above.   Patient Instructions (the written plan) was given to the patient.  Medicare Attestation I have personally reviewed: The patient's medical and social history Their use of alcohol, tobacco or illicit drugs Their current medications and supplements The patient's functional ability including ADLs,fall risks, home safety risks, cognitive, and hearing and visual impairment Diet and physical activities Evidence for depression or mood disorders  The patient's weight, height, BMI, and visual acuity have been recorded in the chart.  I have made referrals, counseling, and provided education to the patient based on review of the above and I have provided the patient  with a written personalized care plan for preventive services.

## 2017-06-17 NOTE — Patient Instructions (Addendum)
Thank you for coming in today. You are doing pretty well overall  Do the home PT exercises.  If not better we will do a shot and complete the further workup.   Recheck this in about 3 moths.   Do the exercises we discussed about 30 reps in all 4 motions.    Secondary Shoulder Impingement Syndrome Rehab Ask your health care provider which exercises are safe for you. Do exercises exactly as told by your health care provider and adjust them as directed. It is normal to feel mild stretching, pulling, tightness, or discomfort as you do these exercises, but you should stop right away if you feel sudden pain or your pain gets worse. Do not begin these exercises until told by your health care provider. Stretching and range of motion exercise This exercise warms up your muscles and joints and improves the movement and flexibility of your neck and shoulder. This exercise also helps to relieve pain and stiffness. Exercise A: Cervical side bend  1. Using good posture, sit on a stable chair, or stand up. 2. Without moving your shoulders, slowly tilt your left / right ear toward your left / right shoulder until you feel a stretch in your neck muscles. You should be looking straight ahead. 3. Hold for __________ seconds. 4. Slowly return to the starting position. 5. Repeat on your left / right side. Repeat __________ times. Complete this exercise __________ times a day. Strengthening exercises These exercises build strength and endurance in your shoulder. Endurance is the ability to use your muscles for a long time, even after they get tired. Exercise B: Scapular protraction, supine  1. Lie on your back on a firm surface. Hold a __________ weight in your left / right hand. 2. Raise your left / right arm straight into the air so your hand is directly above your shoulder joint. 3. Push the weight into the air so your shoulder lifts off of the surface that you are lying on. Do not move your head, neck, or  back. 4. Hold for __________ seconds. 5. Slowly return to the starting position. Let your muscles relax completely before you repeat this exercise. Repeat __________ times. Complete this exercise __________ times a day. Exercise C: Scapular retraction  1. Sit in a stable chair without armrests, or stand. 2. Secure an exercise band to a stable object in front of you so the band is at shoulder height. 3. Hold one end of the exercise band in each hand. Your palms should face down. 4. Squeeze your shoulder blades together and move your elbows slightly behind you. Do not shrug your shoulders while you do this. 5. Hold for __________ seconds. 6. Slowly return to the starting position. Repeat __________ times. Complete this exercise __________ times a day. Exercise D: Shoulder extension with scapular retraction  1. Sit in a stable chair without armrests, or stand. 2. Secure an exercise band to a stable object in front of you where it is above shoulder height. 3. Hold one end of the exercise band in each hand. 4. Straighten your elbows and lift your hands up to shoulder height. 5. Squeeze your shoulder blades together and pull your hands down to the sides of your thighs. Stop when your hands are straight down by your sides. Do not let your hands go behind your body. 6. Hold for __________ seconds. 7. Slowly return to the starting position. Repeat __________ times. Complete this exercise __________ times a day. Exercise E: Shoulder abduction 1. Sit  in a stable chair without armrests, or stand. 2. If directed, hold a __________ weight in your left / right hand. 3. Start with your arms straight down. Turn your left / right hand so your palm faces in, toward your body. 4. Slowly lift your left / right hand out to your side. Do not lift your hand above shoulder height. ? Keep your arms straight. ? Avoid shrugging your shoulder while you do this movement. Keep your shoulder blade tucked down toward  the middle of your back. 5. Hold for __________ seconds. 6. Slowly lower your arm, and return to the starting position. Repeat __________ times. Complete this exercise __________ times a day. This information is not intended to replace advice given to you by your health care provider. Make sure you discuss any questions you have with your health care provider. Document Released: 09/01/2005 Document Revised: 05/08/2016 Document Reviewed: 08/04/2015 Elsevier Interactive Patient Education  [employer].

## 2017-07-21 ENCOUNTER — Encounter: Payer: Self-pay | Admitting: Family Medicine

## 2017-07-21 ENCOUNTER — Ambulatory Visit: Payer: Medicare Other | Admitting: Family Medicine

## 2017-07-21 ENCOUNTER — Ambulatory Visit (INDEPENDENT_AMBULATORY_CARE_PROVIDER_SITE_OTHER): Payer: Medicare Other

## 2017-07-21 VITALS — BP 128/74 | HR 98 | Wt 324.0 lb

## 2017-07-21 DIAGNOSIS — M25551 Pain in right hip: Secondary | ICD-10-CM

## 2017-07-21 DIAGNOSIS — M1611 Unilateral primary osteoarthritis, right hip: Secondary | ICD-10-CM

## 2017-07-21 DIAGNOSIS — M7061 Trochanteric bursitis, right hip: Secondary | ICD-10-CM | POA: Diagnosis not present

## 2017-07-21 NOTE — Progress Notes (Signed)
John Morrow is a 66 y.o. male who presents to [facility] today for several week history of right hip pain. The patient states that the pain came on gradually several weeks ago and has worsened over time. The pain is located in the external aspect of the right hip as well as in his groin. Pain is worse in the morning and with extended periods of weight baring and while walking longer distances. He also has significant pain with standing from the seated position and with getting out of car. He cannot identify an initiating trauma or antecedent illness prior to the onset of the pain. Pain does not radiate and does not involve the right knee. His left hip and bilateral knees are not acutely painful, though he states he has had problems with his knees in the past.   The patient presents today due to concern of ongoing pain and the development of a compensatory limp.   Patient denies fever, erythema over joint, or skin changes. He has not been ill recently. He did present one month ago for acute on chronic shoulder pain, which is still present but has improved.    Past Medical History:  Diagnosis Date  . Hyperlipemia   . Hypertension    Past Surgical History:  Procedure Laterality Date  . KNEE SURGERY     Social History   Tobacco Use  . Smoking status: Never Smoker  . Smokeless tobacco: Never Used  Substance Use Topics  . Alcohol use: Yes    Comment: occassionally     ROS:  As above   Medications: Current Outpatient Medications  Medication Sig Dispense Refill  . amLODipine (NORVASC) 10 MG tablet Take 1 tablet (10 mg total) by mouth daily. 90 tablet 1  . furosemide (LASIX) 20 MG tablet Take 1 tablet (20 mg total) by mouth daily. 90 tablet 1  . lisinopril-hydrochlorothiazide (PRINZIDE,ZESTORETIC) 20-25 MG tablet Take 1 tablet by mouth daily. 90 tablet 3  . simvastatin (ZOCOR) 40 MG tablet Take 1 tablet (40 mg total) by mouth daily at 6  PM. 90 tablet 3  . tamsulosin (FLOMAX) 0.4 MG CAPS capsule Take 1 capsule (0.4 mg total) by mouth daily. 90 capsule 3   No current facility-administered medications for this visit.    Allergies  Allergen Reactions  . Morphine And Related     Rash, vomiting, itching      Exam:  BP 128/74   Pulse 98   Wt (!) 324 lb (147 kg)   BMI 45.19 kg/m  General: Well Developed, well nourished, and in no acute distress.  Neuro/Psych: Alert and oriented x3, extra-ocular muscles intact, able to move all 4 extremities, sensation grossly intact. Skin: Warm and dry, no rashes noted.  Respiratory: Not using accessory muscles, speaking in full sentences, trachea midline.  Cardiovascular: Pulses palpable, no extremity edema. Abdomen: Does not appear distended. MSK:  R. Hip: Tender at insertion of gluteus medius 5/5 strength in right hip flexion, extension. 4/5 in hip abduction Sensation grossly intact Full ROM in external rotation. Minimal ROM in internal rotation. Nonpainful hip flexion. Straight leg raise produces pain at lateral hip. External rotation produces pain at lateral hip and groin. Resistance against adductive force produces pain at lateral hip.   Pelvic Xrays: minimal acetabular joint space narrowing, right greater than left. Will follow up on radiologist read.   No results found for this or any previous visit (from the past 48 hour(s)). No results found.  Assessment  and Plan: 66 y.o. male with several weeks history of gradual onset worsening right hip and groin pain.   Patient's presentation most consistent with trochanteric bursitis with likely underlying age-related osteoarthritis. Lateral hip pain and tenderness at insertion of gluteus muscles support this diagnosis. Pelvic Xrays also show minimal joint space narrowing right > left, which likely represents some degree of osteoarthritis that may be contributing.   John Morrow will benefit from weekly physical therapy. We have  provided him with a referral. In addition, patient will benefit from regular home exercises to strengthen hip abductors.   Currently, degree degeneration and acetabulofemoral joint space narrowing does not warrant orthopedic referral.   Patient was amenable to plan and will call or return if symptoms do not improve.    Orders Placed This Encounter  Procedures  . DG Pelvis 1-2 Views    Standing Status:   Future    Number of Occurrences:   1    Standing Expiration Date:   09/20/2018    Order Specific Question:   Reason for Exam (SYMPTOM  OR DIAGNOSIS REQUIRED)    Answer:   eval hip djd right side.    Order Specific Question:   Preferred imaging location?    Answer:   [facility]    Order Specific Question:   Radiology Contrast Protocol - do NOT remove file path    Answer:   [email or web address]  . Ambulatory referral to Physical Therapy    Referral Priority:   Routine    Referral Type:   Physical Medicine    Referral Reason:   Specialty Services Required    Requested Specialty:   Physical Therapy   No orders of the defined types were placed in this encounter.   Discussed warning signs or symptoms. Please see discharge instructions. Patient expresses understanding.

## 2017-07-21 NOTE — Patient Instructions (Signed)
Thank you for coming in today. Attend PT.  Recheck with me in 6 weeks.  Return sooner if needed.   Trochanteric Bursitis Trochanteric bursitis is a condition that causes hip pain. Trochanteric bursitis happens when fluid-filled sacs (bursae) in the hip get irritated. Normally these sacs absorb shock and help strong bands of tissue (tendons) in your hip glide smoothly over each other and over your hip bones. What are the causes? This condition results from increased friction between the hip bones and the tendons that go over them. This condition can happen if you:  Have weak hips.  Use your hip muscles too much (overuse).  Get hit in the hip.  What increases the risk? This condition is more likely to develop in:  Women.  Adults who are middle-aged or older.  People with arthritis or a spinal condition.  People with weak buttocks muscles (gluteal muscles).  People who have one leg that is shorter than the other.  People who participate in certain kinds of athletic activities, such as: ? Running sports, especially long-distance running. ? Contact sports, like football or martial arts. ? Sports in which falls may occur, like skiing.  What are the signs or symptoms? The main symptom of this condition is pain and tenderness over the point of your hip. The pain may be:  Sharp and intense.  Dull and achy.  Felt on the outside of your thigh.  It may increase when you:  Lie on your side.  Walk or run.  Go up on stairs.  Sit.  Stand up after sitting.  Stand for long periods of time.  How is this diagnosed? This condition may be diagnosed based on:  Your symptoms.  Your medical history.  A physical exam.  Imaging tests, such as: ? X-rays to check your bones. ? An MRI or ultrasound to check your tendons and muscles.  During your physical exam, your health care provider will check the movement and strength of your hip. He or she may press on the point of your  hip to check for pain. How is this treated? This condition may be treated by:  Resting.  Reducing your activity.  Avoiding activities that cause pain.  Using crutches, a cane, or a walker to decrease the strain on your hip.  Taking medicine to help with swelling.  Having medicine injected into the bursae to help with swelling.  Using ice, heat, and massage therapy for pain relief.  Physical therapy exercises for strength and flexibility.  Surgery (rare).  Follow these instructions at home: Activity  Rest.  Avoid activities that cause pain.  Return to your normal activities as told by your health care provider. Ask your health care provider what activities are safe for you. Managing pain, stiffness, and swelling  Take over-the-counter and prescription medicines only as told by your health care provider.  If directed, apply heat to the injured area as told by your health care provider. ? Place a towel between your skin and the heat source. ? Leave the heat on for 20-30 minutes. ? Remove the heat if your skin turns bright red. This is especially important if you are unable to feel pain, heat, or cold. You may have a greater risk of getting burned.  If directed, apply ice to the injured area: ? Put ice in a plastic bag. ? Place a towel between your skin and the bag. ? Leave the ice on for 20 minutes, 2-3 times a day. General instructions  If  the affected leg is one that you use for driving, ask your health care provider when it is safe to drive.  Use crutches, a cane, or a walker as told by your health care provider.  If one of your legs is shorter than the other, get fitted for a shoe insert.  Lose weight if you are overweight. How is this prevented?  Wear supportive footwear that is appropriate for your sport.  If you have hip pain, start any new exercise or sport slowly.  Maintain physical fitness, including: ? Strength. ? Flexibility. Contact a health care  provider if:  Your pain does not improve with 2-4 weeks. Get help right away if:  You develop severe pain.  You have a fever.  You develop increased redness over your hip.  You have a change in your bowel function or bladder function.  You cannot control the muscles in your feet. This information is not intended to replace advice given to you by your health care provider. Make sure you discuss any questions you have with your health care provider. Document Released: 10/09/2004 Document Revised: 05/07/2016 Document Reviewed: 08/17/2015 Elsevier Interactive Patient Education  [employer].

## 2017-07-23 ENCOUNTER — Encounter: Payer: Self-pay | Admitting: Physical Therapy

## 2017-07-23 ENCOUNTER — Ambulatory Visit (INDEPENDENT_AMBULATORY_CARE_PROVIDER_SITE_OTHER): Payer: Medicare Other | Admitting: Physical Therapy

## 2017-07-23 ENCOUNTER — Other Ambulatory Visit: Payer: Self-pay

## 2017-07-23 DIAGNOSIS — M25651 Stiffness of right hip, not elsewhere classified: Secondary | ICD-10-CM | POA: Diagnosis not present

## 2017-07-23 DIAGNOSIS — M25551 Pain in right hip: Secondary | ICD-10-CM

## 2017-07-23 DIAGNOSIS — M6281 Muscle weakness (generalized): Secondary | ICD-10-CM

## 2017-07-23 DIAGNOSIS — R293 Abnormal posture: Secondary | ICD-10-CM

## 2017-07-23 NOTE — Patient Instructions (Addendum)
Quads / HF, Prone    Lie face down, knees together. Grasp one ankle with same-side hand. Use towel if needed to reach. Gently pull foot toward buttock. Hold _45__ seconds. Repeat __2_ times per session. Do _1__ sessions per day. Repeat on the other leg.   Hip Flexor Stretch    Lying on back near edge of bed, bend one leg, foot flat. Hang other leg over edge, relaxed, thigh resting entirely on bed for ___45_ secs. Repeat _2___ times. Do __1__ sessions per day.  Repeat on the other leg. Advanced Exercise: Bend knee back keeping thigh in contact with bed.   Outer Hip Stretch: Reclined IT Band Stretch (Strap)    Strap around opposite foot, pull across only as far as possible with shoulders on mat. Hold for __45__ secs. Repeat __2__ times each leg.  Gastroc Stretch    Stand with right foot back, leg straight, forward leg bent. Keeping heel on floor, turned slightly out, lean into wall until stretch is felt in calf. Hold __45__ seconds. Repeat __2__ times per set. Do _1___ sets per session. Do _1___ sessions per day. Repeat on the other leg.

## 2017-07-23 NOTE — Therapy (Signed)
[facility] [place], [place], [address] Phone: [phone]   Fax:  [phone]  Physical Therapy Evaluation  Patient Details  Name: John Morrow MRN: [id number] Date of Birth: [birth year] Referring Provider: Dr Steva Colder   Encounter Date: 07/23/2017  PT End of Session - 07/23/17 0926    Visit Number  1    Number of Visits  6    Date for PT Re-Evaluation  09/03/17    PT Start Time  0926    PT Stop Time  1018    PT Time Calculation (min)  52 min       Past Medical History:  Diagnosis Date  . Hyperlipemia   . Hypertension     Past Surgical History:  Procedure Laterality Date  . KNEE SURGERY      There were no vitals filed for this visit.   Subjective Assessment - 07/23/17 0930    Subjective  Pt reports his Rt hip started bothering him about 2-3 months ago. He was hoping it would go away as he didn't want to have a hip replacement. He tried rest and medication    Pertinent History  >25 yrs ago rt ACL repair    How long can you sit comfortably?  no limitations    How long can you walk comfortably?  has pain as soon as he wakes up and starts walking.     Diagnostic tests  x-ray (-)     Patient Stated Goals  get rid of pain and return his mobility    Currently in Pain?  Yes    Pain Score  8     Pain Location  Hip    Pain Orientation  Right    Pain Descriptors / Indicators  Sharp    Pain Type  Acute pain    Pain Radiating Towards  from posterior hip into the groin Rt     Pain Onset  More than a month ago    Pain Frequency  Constant    Aggravating Factors   getting out of the car, bending over, stairs and hip twisting    Pain Relieving Factors  ice - gets some relief    Multiple Pain Sites  Yes         [facility] PT Assessment - 07/23/17 0001      Assessment   Medical Diagnosis  Rt hip bursitis    Referring Provider  Dr Steva Colder    Onset Date/Surgical Date  04/22/17    Hand Dominance  Right    Next MD Visit  08/29/17    Prior Therapy  none      Precautions   Precautions  None      Balance Screen   Has the patient fallen in the past 6 months  No      [place] residence    Home Layout  One level      Prior Function   Level of Independence  Independent    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, piddle around the house, walk some when not having pain      Observation/Other Assessments   Focus on Therapeutic Outcomes (FOTO)   60% limited      Functional Tests   Functional tests  Squat;Single leg stance      Squat   Comments  WNL      Single Leg Stance   Comments  Lt 10  sec, Rt has pain 2sec      Posture/Postural Control   Posture/Postural Control  Postural limitations    Postural Limitations  Flexed trunk;Decreased lumbar lordosis obesity   obesity     ROM / Strength   AROM / PROM / Strength  AROM;Strength      AROM   AROM Assessment Site  Lumbar;Hip;Ankle    Right/Left Hip  Right;Left    Right Hip Extension  0    Right Hip Internal Rotation   5    Left Hip Extension  15    Left Hip Internal Rotation   15    Right/Left Ankle  -- dorsiflexion Lt 12, Rt 10   dorsiflexion Lt 12, Rt 10   Lumbar Flexion  4" from floor, tight in hamstrings    Lumbar Extension  25% limited with groin pain    Lumbar - Right Side Bend  WNL    Lumbar - Left Side Bend  WNL    Lumbar - Right Rotation  WNL    Lumbar - Left Rotation  25% with significant Rt buttock/groin pain      Strength   Strength Assessment Site  Hip;Knee;Ankle    Right/Left Hip  Right;Left    Right Hip Flexion  4/5    Right Hip Extension  4+/5    Right Hip ABduction  4-/5 with pain   with pain   Left Hip Flexion  5/5    Left Hip Extension  4+/5    Left Hip ABduction  5/5    Right/Left Knee  -- WNL   WNL   Right/Left Ankle  -- WNL   WNL     Flexibility   Soft Tissue Assessment /Muscle Length  yes    Hamstrings  WNL    Quadriceps  bilat 70 degrees knee flexion  with prone stretch    ITB  tight bilat      Palpation   Spinal mobility  lumbar WNL    Palpation comment  tight and tender in Rt gluts, piriformis and other hip rotators.       Special Tests    Special Tests  -- (-) lumbar and SIJ tests   (-) lumbar and SIJ tests            Objective measurements completed on examination: See above findings.      [facility] Adult PT Treatment/Exercise - 07/23/17 0001      Exercises   Exercises  Knee/Hip      Knee/Hip Exercises: Stretches   Active Hamstring Stretch  Both;1 rep;20 seconds    Quad Stretch  Both;2 reps 45 sec   45 sec   Hip Flexor Stretch  Both;2 reps 45 sec, leg off edge of bed   45 sec, leg off edge of bed   ITB Stretch  Both;2 reps 45 sec, cross body stretch   45 sec, cross body stretch   Gastroc Stretch  Both;1 rep 45 sec   45 sec            PT Education - 07/23/17 1006    Education provided  Yes    Education Details  HEP     Person(s) Educated  Patient    Methods  Explanation;Demonstration;Handout    Comprehension  Verbalized understanding;Returned demonstration          PT Long Term Goals - 07/23/17 0925      PT LONG TERM GOAL #1   Title  I with advanced HEP for LE flexibility (  09/03/17)     Time  6    Period  Weeks    Status  New      PT LONG TERM GOAL #2   Title  improve FOTO =/< 38% limited, CJ level (09/03/17)     Time  6    Period  Weeks    Status  New      PT LONG TERM GOAL #3   Title  improve Rt hip internal rotation and extension ROM to WNL to allow for upright ambulation( 09/03/17)     Time  6    Period  Weeks    Status  New      PT LONG TERM GOAL #4   Title  demo bilat hip strength WNL ( 09/03/17)     Time  6    Period  Weeks    Status  New      PT LONG TERM GOAL #5   Title  report =/> 75% reduction in Rt hip pain to allow him to navigate stairs and get in/out of car easier ( 09/03/17)     Time  6    Period  Weeks    Status  New             Plan - 2017/07/24 1103     Clinical Impression Statement  66 yo male presents with ~ 3 month h/o Rt hip pain of insideous onset.  He has a lot of tightness throught the hips Rt > Lt and also some weakness.  The weakness may be more related to pain than true weakness.  He holds himself in a forward flexed postion throught the hips further shortening his hip flexors and limiting his Rt hip ROM.     Clinical Presentation  Stable    Clinical Decision Making  Low    Rehab Potential  Excellent    PT Frequency  1x / week    PT Duration  6 weeks    PT Treatment/Interventions  Iontophoresis 4mg /ml Dexamethasone;Gait training;Neuromuscular re-education;Dry needling;Manual techniques;Patient/family education;Moist Heat;Ultrasound;Cryotherapy;Electrical Stimulation    PT Next Visit Plan  manual work Rt gluts, ITB and hip rotators,  possible ultrasound or ionto to Rt hip if needed.  stretching.     Consulted and Agree with Plan of Care  Patient       Patient will benefit from skilled therapeutic intervention in order to improve the following deficits and impairments:  Pain, Postural dysfunction, Increased muscle spasms, Decreased range of motion, Decreased strength, Obesity, Impaired flexibility, Difficulty walking  Visit Diagnosis: Pain in right hip - Plan: PT plan of care cert/re-cert  Stiffness of right hip, not elsewhere classified - Plan: PT plan of care cert/re-cert  Muscle weakness (generalized) - Plan: PT plan of care cert/re-cert  Abnormal posture - Plan: PT plan of care cert/re-cert  [facility] PT PB G-CODES - 07-24-17 8250    Functional Assessment Tool Used   FOTO and professional judgement    Functional Limitations  Mobility: Walking and moving around    Mobility: Walking and Moving Around Current Status  At least 60 percent but less than 80 percent impaired, limited or restricted    Mobility: Walking and Moving Around Goal Status [phone])  At least 20 percent but less than 40 percent impaired, limited or restricted         Problem List Patient Active Problem List   Diagnosis Date Noted  . Rotator cuff tendonitis, right 06/17/2017  . Dependent edema 05/05/2017  . Bilateral knee pain  09/25/2016  . Diverticulosis of colon without hemorrhage 06/12/2016  . BPH (benign prostatic hyperplasia) 05/29/2016  . Urine frequency 05/29/2016  . HTN (hypertension) 05/29/2016  . HLD (hyperlipidemia) 05/29/2016  . Obese 05/29/2016    Jeral Pinch PT  07/23/2017, 11:24 AM  [facility] [place], [place], [address] Phone: [phone]   Fax:  [phone]  Name: John Morrow MRN: [id number] Date of Birth: May 09, 1951

## 2017-07-30 ENCOUNTER — Ambulatory Visit: Payer: Medicare Other | Admitting: Physical Therapy

## 2017-07-30 ENCOUNTER — Encounter: Payer: Self-pay | Admitting: Physical Therapy

## 2017-07-30 DIAGNOSIS — M6281 Muscle weakness (generalized): Secondary | ICD-10-CM

## 2017-07-30 DIAGNOSIS — M25651 Stiffness of right hip, not elsewhere classified: Secondary | ICD-10-CM | POA: Diagnosis not present

## 2017-07-30 DIAGNOSIS — M25551 Pain in right hip: Secondary | ICD-10-CM

## 2017-07-30 DIAGNOSIS — R293 Abnormal posture: Secondary | ICD-10-CM | POA: Diagnosis not present

## 2017-07-30 NOTE — Therapy (Signed)
[place], [place], [address] Phone: [phone]   Fax:  [phone]  Physical Therapy Treatment  Patient Details  Name: John Morrow MRN: [id number] Date of Birth: November 07, 1950 Referring Provider: Dr. Lynne Leader    Encounter Date: 07/30/2017  PT End of Session - 07/30/17 0934    Visit Number  2    Number of Visits  6    Date for PT Re-Evaluation  09/03/17    PT Start Time  0933    PT Stop Time  1026    PT Time Calculation (min)  53 min    Activity Tolerance  Patient tolerated treatment well    Behavior During Therapy  [facility] for tasks assessed/performed       Past Medical History:  Diagnosis Date  . Hyperlipemia   . Hypertension     Past Surgical History:  Procedure Laterality Date  . KNEE SURGERY      There were no vitals filed for this visit.  Subjective Assessment - 07/30/17 0935    Subjective  He reports when he wakes up in morning his hip is stiff and painful.  He has been doing his exercises daily, with no improvement.     Patient Stated Goals  get rid of pain and return his mobility    Pain Score  4  up to 7/03 with certain motions    Pain Location  Hip    Pain Orientation  Right    Pain Descriptors / Indicators  Sharp stiff    Aggravating Factors   getting out of car or chair, stairs, first thing in morning     Pain Relieving Factors  ice          [facility] PT Assessment - 07/30/17 0001      Assessment   Medical Diagnosis  Rt hip bursitis    Referring Provider  Dr. Lynne Leader     Onset Date/Surgical Date  04/22/17    Hand Dominance  Right    Next MD Visit  08/29/17       [facility] Adult PT Treatment/Exercise - 07/30/17 0001      Self-Care   Self-Care  --    Other Self-Care Comments   Educated pt on self massage with ball to Rt hip to decrease tightness; pt verbalized understanding and returned demo.        Knee/Hip Exercises: Stretches   Passive Hamstring Stretch   Right;Left;2 reps 45 sec, supine with strap    Hip Flexor Stretch  2 reps;Right;30 seconds 45 sec, leg off edge of bed    ITB Stretch  Both;2 reps 45 sec, cross body stretch    Piriformis Stretch  Right;Left;2 reps;30 seconds fig 4, knee towards opp shoulder    Other Knee/Hip Stretches  standing Rt adductor stretch x 3 reps of 15 sec      Knee/Hip Exercises: Aerobic   Nustep  L4: 5 min       Modalities   Modalities  Cryotherapy;Electrical Stimulation      Cryotherapy   Number Minutes Cryotherapy  15 Minutes    Cryotherapy Location  Hip    Type of Cryotherapy  Ice pack      Electrical Stimulation   Electrical Stimulation Location  Rt hip    Electrical Stimulation Action  IFC    Electrical Stimulation Parameters  to tolerance     Electrical Stimulation Goals  Pain      Manual Therapy   Manual  Therapy  Soft tissue mobilization    Manual therapy comments  pt in Lt sidelying    Soft tissue mobilization  TPR to Rt hip with contract/relax of hip rotators/abductors.  STM to Rt glute med/max                   PT Long Term Goals - 07/23/17 1660      PT LONG TERM GOAL #1   Title  I with advanced HEP for LE flexibility ( 09/03/17)     Time  6    Period  Weeks    Status  New      PT LONG TERM GOAL #2   Title  improve FOTO =/< 38% limited, CJ level (09/03/17)     Time  6    Period  Weeks    Status  New      PT LONG TERM GOAL #3   Title  improve Rt hip internal rotation and extension ROM to WNL to allow for upright ambulation( 09/03/17)     Time  6    Period  Weeks    Status  New      PT LONG TERM GOAL #4   Title  demo bilat hip strength WNL ( 09/03/17)     Time  6    Period  Weeks    Status  New      PT LONG TERM GOAL #5   Title  report =/> 75% reduction in Rt hip pain to allow him to navigate stairs and get in/out of car easier ( 09/03/17)     Time  6    Period  Weeks    Status  New            Plan - 07/30/17 1011    Clinical Impression Statement   Pt continues with tightness in Rt hip; required minor cues on form and how long to hold stretches.  Point tender with manual therapy into Rt piriformis; reduced with TPR to area.  Further reduction with ice/estim to Rt hip at end of session.  Progressing towards goals.     Rehab Potential  Excellent    PT Frequency  1x / week    PT Duration  6 weeks    PT Treatment/Interventions  Iontophoresis 4mg /ml Dexamethasone;Gait training;Neuromuscular re-education;Dry needling;Manual techniques;Patient/family education;Moist Heat;Ultrasound;Cryotherapy;Electrical Stimulation    PT Next Visit Plan  continue manual work to Rt glutes/ include adductors. possible combo [name] or ionto to Rt hip.      Consulted and Agree with Plan of Care  Patient       Patient will benefit from skilled therapeutic intervention in order to improve the following deficits and impairments:  Pain, Postural dysfunction, Increased muscle spasms, Decreased range of motion, Decreased strength, Obesity, Impaired flexibility, Difficulty walking  Visit Diagnosis: Pain in right hip  Stiffness of right hip, not elsewhere classified  Muscle weakness (generalized)  Abnormal posture     Problem List Patient Active Problem List   Diagnosis Date Noted  . Rotator cuff tendonitis, right 06/17/2017  . Dependent edema 05/05/2017  . Bilateral knee pain 09/25/2016  . Diverticulosis of colon without hemorrhage 06/12/2016  . BPH (benign prostatic hyperplasia) 05/29/2016  . Urine frequency 05/29/2016  . HTN (hypertension) 05/29/2016  . HLD (hyperlipidemia) 05/29/2016  . Obese 05/29/2016   Kerin Perna, PTA 07/30/17 10:17 AM  [facility] [place], [place], [address] Phone: [phone]   Fax:  [phone]  Name: John Morrow MRN: [id number] Date of Birth: 1951-05-16

## 2017-07-30 NOTE — Patient Instructions (Addendum)
TENS UNIT  This is helpful for muscle pain and spasm.   Search and Purchase a TENS 7000 2nd edition at www.tenspros.com or www.amazon.com  (It should be less than $30)     TENS unit instructions:   Do not shower or bathe with the unit on  Turn the unit off before removing electrodes or batteries  If the electrodes lose stickiness add a drop of water to the electrodes after they are disconnected from the unit and place on plastic sheet. If you continued to have difficulty, call the TENS unit company to purchase more electrodes.  Do not apply lotion on the skin area prior to use. Make sure the skin is clean and dry as this will help prolong the life of the electrodes.  After use, always check skin for unusual red areas, rash or other skin difficulties. If there are any skin problems, does not apply electrodes to the same area.  Never remove the electrodes from the unit by pulling the wires.  Do not use the TENS unit or electrodes other than as directed.  Do not change electrode placement without consulting your therapist or physician.  Keep 2 fingers with between each electrode.   Piriformis Stretch - Supine   ** BEND both knees and place Right ankle on Left thigh.     Pull uninvolved knee across body toward opposite shoulder. Hold slight stretch for __30-45_ seconds. Repeat with involved leg. Repeat _3__ times. Do __2-3_ times per day.

## 2017-08-05 ENCOUNTER — Ambulatory Visit: Payer: Medicare Other | Admitting: Physical Therapy

## 2017-08-05 ENCOUNTER — Encounter: Payer: Self-pay | Admitting: Physical Therapy

## 2017-08-05 DIAGNOSIS — M6281 Muscle weakness (generalized): Secondary | ICD-10-CM | POA: Diagnosis not present

## 2017-08-05 DIAGNOSIS — M25551 Pain in right hip: Secondary | ICD-10-CM

## 2017-08-05 DIAGNOSIS — M25651 Stiffness of right hip, not elsewhere classified: Secondary | ICD-10-CM

## 2017-08-05 DIAGNOSIS — R293 Abnormal posture: Secondary | ICD-10-CM | POA: Diagnosis not present

## 2017-08-05 NOTE — Patient Instructions (Signed)
Hip Adduction: Leg Lift (Eccentric) - Side-Lying    Lie on side with top leg bent, foot flat behind lower leg. Quickly lift lower leg. Slowly lower for 3-5 seconds. _10__ reps per set, _2__ sets per session.Top leg on chair.   HIP: Abduction - Side-Lying    Lie on side, legs straight and in line with trunk. Squeeze glutes. Raise top leg up and slightly back. Point toes forward. _10__ reps per set, __2_ sets per session.   Butterfly, Supine With Partner    Lie on back, feet together. Have partner gently push knees toward floor. Hold _15-30__ seconds. Repeat _2-3__ times per session. You can have Right knee propped on pillow to relax into.    [facility] at [facility] [place], [place] [address]  [phone] (office) [phone] (fax)

## 2017-08-05 NOTE — Therapy (Signed)
[place], [place], [address] Phone: [phone]   Fax:  [phone]  Physical Therapy Treatment  Patient Details  Name: John Morrow MRN: [id number] Date of Birth: Jul 09, 1951 Referring Provider: Dr. Lynne Leader    Encounter Date: 08/05/2017  PT End of Session - 08/05/17 1224    Visit Number  3    Number of Visits  6    Date for PT Re-Evaluation  09/03/17    PT Start Time  0933    PT Stop Time  1035    PT Time Calculation (min)  62 min    Activity Tolerance  Patient tolerated treatment well    Behavior During Therapy  [facility] for tasks assessed/performed       Past Medical History:  Diagnosis Date  . Hyperlipemia   . Hypertension     Past Surgical History:  Procedure Laterality Date  . KNEE SURGERY      There were no vitals filed for this visit.  Subjective Assessment - 08/05/17 0937    Subjective  Pt received his TENS unit.  He has been stretching, icing, using TENS.  He is having a little less pain. He is having an easier time getting out of car. He reports 40% improvement of symptoms.     Patient Stated Goals  get rid of pain and return his mobility    Currently in Pain?  Yes    Pain Score  3  up to 4/85 with certain motions    Pain Location  Groin groin    Pain Orientation  Right    Pain Descriptors / Indicators  Tightness    Aggravating Factors   first thing in morning, stairs    Pain Relieving Factors  ice         [facility] PT Assessment - 08/05/17 0001      Assessment   Medical Diagnosis  Rt hip bursitis    Referring Provider  Dr. Lynne Leader     Onset Date/Surgical Date  04/22/17    Hand Dominance  Right    Next MD Visit  08/29/17    Prior Therapy  none      AROM   Right Hip Extension  12 standing    Right Hip Internal Rotation   22 prone    Left Hip Internal Rotation   27 prone        [facility] Adult PT Treatment/Exercise - 08/05/17 0001      Knee/Hip Exercises:  Stretches   Hip Flexor Stretch  Right;Left;2 reps;30 seconds standing, with opp leg reaching into ext. VC for posture    Piriformis Stretch  Right;Left;2 reps;30 seconds fig 4, knee towards opp shoulder    Other Knee/Hip Stretches  standing Rt adductor stretch x 4 reps of 15 sec    Other Knee/Hip Stretches  butterfly stretch x 30 sec      Knee/Hip Exercises: Aerobic   Nustep  L5: 5 min       Knee/Hip Exercises: Sidelying   Hip ABduction  Strengthening;Right;2 sets;10 reps, 10 reps on LLE.     Hip ADduction  Right;Left;1 set;10 reps with LLE on chair above RLE      Cryotherapy   Number Minutes Cryotherapy  20 Minutes    Cryotherapy Location  Hip Rt    Type of Cryotherapy  Ice pack      Electrical Stimulation   Electrical Stimulation Location  Rt lateral hip/ Rt adductors    Electrical  Stimulation Action  premod to each area    Electrical Stimulation Parameters  to tolerance     Electrical Stimulation Goals  Tone;Pain      Manual Therapy   Manual therapy comments  pt supine    Soft tissue mobilization  STM to Rt adductors                   PT Long Term Goals - 08/05/17 0943      PT LONG TERM GOAL #1   Title  I with advanced HEP for LE flexibility ( 09/03/17)     Time  6    Period  Weeks    Status  On-going      PT LONG TERM GOAL #2   Title  improve FOTO =/< 38% limited, CJ level (09/03/17)     Time  6    Period  Weeks    Status  On-going      PT LONG TERM GOAL #3   Title  improve Rt hip internal rotation and extension ROM to WNL to allow for upright ambulation( 09/03/17)     Time  6    Period  Weeks    Status  On-going      PT LONG TERM GOAL #4   Title  demo bilat hip strength WNL ( 09/03/17)     Time  6    Period  Weeks    Status  On-going      PT LONG TERM GOAL #5   Title  report =/> 75% reduction in Rt hip pain to allow him to navigate stairs and get in/out of car easier ( 09/03/17)     Time  6    Period  Weeks    Status  On-going             Plan - 08/05/17 1218    Clinical Impression Statement  Pt's overall pain has decreased and he is reporting greater ease with functional activities. Hip ROM has improved.  Pain in his Rt groin now more reported than Rt lateral hip.  hip adduction exercise was a challenge on his Rt side; added to HEP.  Pt progressing towards goals.     Rehab Potential  Excellent    PT Frequency  1x / week    PT Duration  6 weeks    PT Treatment/Interventions  Iontophoresis 4mg /ml Dexamethasone;Gait training;Neuromuscular re-education;Dry needling;Manual techniques;Patient/family education;Moist Heat;Ultrasound;Cryotherapy;Electrical Stimulation    PT Next Visit Plan  Manual work to [employer]; possible combo [name].     Consulted and Agree with Plan of Care  Patient       Patient will benefit from skilled therapeutic intervention in order to improve the following deficits and impairments:  Pain, Postural dysfunction, Increased muscle spasms, Decreased range of motion, Decreased strength, Obesity, Impaired flexibility, Difficulty walking  Visit Diagnosis: Pain in right hip  Stiffness of right hip, not elsewhere classified  Muscle weakness (generalized)  Abnormal posture     Problem List Patient Active Problem List   Diagnosis Date Noted  . Rotator cuff tendonitis, right 06/17/2017  . Dependent edema 05/05/2017  . Bilateral knee pain 09/25/2016  . Diverticulosis of colon without hemorrhage 06/12/2016  . BPH (benign prostatic hyperplasia) 05/29/2016  . Urine frequency 05/29/2016  . HTN (hypertension) 05/29/2016  . HLD (hyperlipidemia) 05/29/2016  . Obese 05/29/2016   Kerin Perna, PTA 08/05/17 12:25 PM  [place], [place], [address] Phone: [phone]  Fax:  [phone]  Name: John Morrow MRN: [id number] Date of Birth: October 23, 1950

## 2017-08-13 ENCOUNTER — Ambulatory Visit: Payer: Medicare Other | Admitting: Physical Therapy

## 2017-08-13 DIAGNOSIS — M6281 Muscle weakness (generalized): Secondary | ICD-10-CM

## 2017-08-13 DIAGNOSIS — R293 Abnormal posture: Secondary | ICD-10-CM

## 2017-08-13 DIAGNOSIS — M25551 Pain in right hip: Secondary | ICD-10-CM

## 2017-08-13 DIAGNOSIS — M25651 Stiffness of right hip, not elsewhere classified: Secondary | ICD-10-CM | POA: Diagnosis not present

## 2017-08-13 NOTE — Therapy (Signed)
[place], [place], [address] Phone: [phone]   Fax:  [phone]  Physical Therapy Treatment  Patient Details  Name: John Morrow MRN: [id number] Date of Birth: [birth year] Referring Provider: Dr. Lynne Leader   Encounter Date: 08/13/2017  PT End of Session - 08/13/17 0941    Visit Number  4    Number of Visits  6    Date for PT Re-Evaluation  09/03/17    PT Start Time  0934    PT Stop Time  1019    PT Time Calculation (min)  45 min       Past Medical History:  Diagnosis Date  . Hyperlipemia   . Hypertension     Past Surgical History:  Procedure Laterality Date  . KNEE SURGERY      There were no vitals filed for this visit.  Subjective Assessment - 08/13/17 0942    Subjective  [name] reports he may have overstretched 3 days ago. Since then his pain in Rt hip is worse.  He continues to have pain with getting out of car and transitional movements after sitting around.  His pain is intense in the morning after he wakes up (up to8 or 9/10).  "I've had a set back"    Patient Stated Goals  get rid of pain and return his mobility    Currently in Pain?  Yes    Pain Score  3     Pain Location  Hip and Rt groin    Pain Orientation  Right    Pain Descriptors / Indicators  Sharp;Tightness;Dull deep    Aggravating Factors   first thing in morning, stairs     Pain Relieving Factors  ice, TENS         [facility] PT Assessment - 08/13/17 0001      Assessment   Medical Diagnosis  Rt hip bursitis    Referring Provider  Dr. Lynne Leader    Onset Date/Surgical Date  04/22/17    Hand Dominance  Right    Next MD Visit  08/29/17    Prior Therapy  none      [facility] Adult PT Treatment/Exercise - 08/13/17 0001      Knee/Hip Exercises: Stretches   Passive Hamstring Stretch  Right;Left;2 reps 45 sec, supine with strap    ITB Stretch  Right;3 reps;30 seconds    Piriformis Stretch  Right;Left;2 reps;30  seconds fig 4, knee away from opp shoulder    Other Knee/Hip Stretches  attempted butterfly stretch - too painful, stopped.       Knee/Hip Exercises: Aerobic   Nustep  L5: 6 min       Modalities   Modalities  Ultrasound;Electrical Stimulation;Iontophoresis      Acupuncturist Location  Rt glute med/min    Electrical Stimulation Action  combo [name]    Electrical Stimulation Parameters  to tolerance     Electrical Stimulation Goals  Tone;Pain      Ultrasound   Ultrasound Location  Rt glute med    Ultrasound Parameters  combo [name]: 100%, 1.5 w/cm2, 8 min     Ultrasound Goals  Pain tightness      Iontophoresis   Type of Iontophoresis  Dexamethasone    Location  Rt glute med    Dose  1.0 cc    Time  120 mA; 12 hr patch       Manual Therapy   Manual Therapy  Soft tissue mobilization    Manual therapy comments  pt in Lt sidelying    Soft tissue mobilization  STM to Rt lateral hip and deep rotators                   PT Long Term Goals - 08/05/17 0943      PT LONG TERM GOAL #1   Title  I with advanced HEP for LE flexibility ( 09/03/17)     Time  6    Period  Weeks    Status  On-going      PT LONG TERM GOAL #2   Title  improve FOTO =/< 38% limited, CJ level (09/03/17)     Time  6    Period  Weeks    Status  On-going      PT LONG TERM GOAL #3   Title  improve Rt hip internal rotation and extension ROM to WNL to allow for upright ambulation( 09/03/17)     Time  6    Period  Weeks    Status  On-going      PT LONG TERM GOAL #4   Title  demo bilat hip strength WNL ( 09/03/17)     Time  6    Period  Weeks    Status  On-going      PT LONG TERM GOAL #5   Title  report =/> 75% reduction in Rt hip pain to allow him to navigate stairs and get in/out of car easier ( 09/03/17)     Time  6    Period  Weeks    Status  On-going            Plan - 08/13/17 1748    Clinical Impression Statement  Pt has had flare up of symptoms in Rt  hip since last visit. He had difficulty tolerating stretches for RLE due to increased pain.  He reported reduction in pain after manual therapy and further reduction after combo [name] to same area.  Pt may benefit from DN to  Rt hip and groin to assist with reduction in tightness. No new goals met due to flare up.     Rehab Potential  Excellent    PT Frequency  1x / week    PT Duration  6 weeks    PT Treatment/Interventions  Iontophoresis 20m/ml Dexamethasone;Gait training;Neuromuscular re-education;Dry needling;Manual techniques;Patient/family education;Moist Heat;Ultrasound;Cryotherapy;Electrical Stimulation    PT Next Visit Plan  Assess response to ionto and combo [place]  Manual / DN to Rt lateral hip and groin.      Consulted and Agree with Plan of Care  Patient       Patient will benefit from skilled therapeutic intervention in order to improve the following deficits and impairments:  Pain, Postural dysfunction, Increased muscle spasms, Decreased range of motion, Decreased strength, Obesity, Impaired flexibility, Difficulty walking  Visit Diagnosis: Pain in right hip  Stiffness of right hip, not elsewhere classified  Muscle weakness (generalized)  Abnormal posture     Problem List Patient Active Problem List   Diagnosis Date Noted  . Rotator cuff tendonitis, right 06/17/2017  . Dependent edema 05/05/2017  . Bilateral knee pain 09/25/2016  . Diverticulosis of colon without hemorrhage 06/12/2016  . BPH (benign prostatic hyperplasia) 05/29/2016  . Urine frequency 05/29/2016  . HTN (hypertension) 05/29/2016  . HLD (hyperlipidemia) 05/29/2016  . Obese 05/29/2016   JKerin Perna PTA 08/13/17 5:51 PM  [place], [place], [address] Phone: [phone]   Fax:  [phone]  Name: Ilian Wessell MRN: [id number] Date of Birth: 08-Jul-1951

## 2017-08-13 NOTE — Patient Instructions (Signed)
IONTOPHORESIS PATIENT PRECAUTIONS & CONTRAINDICATIONS:  . Redness under one or both electrodes can occur.  This characterized by a uniform redness that usually disappears within 12 hours of treatment. . Small pinhead size blisters may result in response to the drug.  Contact your physician if the problem persists more than 24 hours. . On rare occasions, iontophoresis therapy can result in temporary skin reactions such as rash, inflammation, irritation or burns.  The skin reactions may be the result of individual sensitivity to the ionic solution used, the condition of the skin at the start of treatment, reaction to the materials in the electrodes, allergies or sensitivity to dexamethasone, or a poor connection between the patch and your skin.  Discontinue using iontophoresis if you have any of these reactions and report to your therapist. . Remove the Patch or electrodes if you have any undue sensation of pain or burning during the treatment and report discomfort to your therapist. . Tell your Therapist if you have had known adverse reactions to the application of electrical current. . If using the Patch, the LED light will turn off when treatment is complete and the patch can be removed.  Approximate treatment time is 1-3 hours.  Remove the patch when light goes off or after 6 hours. . The Patch can be worn during normal activity, however excessive motion where the electrodes have been placed can cause poor contact between the skin and the electrode or uneven electrical current resulting in greater risk of skin irritation. Marland Kitchen Keep out of the reach of children.   . DO NOT use if you have a cardiac pacemaker or any other electrically sensitive implanted device. . DO NOT use if you have a known sensitivity to dexamethasone. . DO NOT use during Magnetic Resonance Imaging (MRI). . DO NOT use over broken or compromised skin (e.g. sunburn, cuts, or acne) due to the increased risk of skin reaction. . DO  NOT SHAVE over the area to be treated:  To establish good contact between the Patch and the skin, excessive hair may be clipped. . DO NOT place the Patch or electrodes on or over your eyes, directly over your heart, or brain. . DO NOT reuse the Patch or electrodes as this may cause burns to occur.  Trigger Point Dry Needling  . What is Trigger Point Dry Needling (DN)? o DN is a physical therapy technique used to treat muscle pain and dysfunction. Specifically, DN helps deactivate muscle trigger points (muscle knots).  o A thin filiform needle is used to penetrate the skin and stimulate the underlying trigger point. The goal is for a local twitch response (LTR) to occur and for the trigger point to relax. No medication of any kind is injected during the procedure.   . What Does Trigger Point Dry Needling Feel Like?  o The procedure feels different for each individual patient. Some patients report that they do not actually feel the needle enter the skin and overall the process is not painful. Very mild bleeding may occur. However, many patients feel a deep cramping in the muscle in which the needle was inserted. This is the local twitch response.   Marland Kitchen How Will I feel after the treatment? o Soreness is normal, and the onset of soreness may not occur for a few hours. Typically this soreness does not last longer than two days.  o Bruising is uncommon, however; ice can be used to decrease any possible bruising.  o In rare cases feeling tired or  nauseous after the treatment is normal. In addition, your symptoms may get worse before they get better, this period will typically not last longer than 24 hours.   . What Can I do After My Treatment? o Increase your hydration by drinking more water for the next 24 hours. o You may place ice or heat on the areas treated that have become sore, however, do not use heat on inflamed or bruised areas. Heat often brings more relief post needling. o You can continue  your regular activities, but vigorous activity is not recommended initially after the treatment for 24 hours. o DN is best combined with other physical therapy such as strengthening, stretching, and other therapies.   [facility] at [facility] [place], [place] [address]  [phone] (office) [phone] (fax)

## 2017-08-19 ENCOUNTER — Ambulatory Visit: Payer: Medicare Other | Admitting: Physical Therapy

## 2017-08-19 ENCOUNTER — Encounter: Payer: Self-pay | Admitting: Physical Therapy

## 2017-08-19 DIAGNOSIS — M25551 Pain in right hip: Secondary | ICD-10-CM | POA: Diagnosis not present

## 2017-08-19 DIAGNOSIS — R293 Abnormal posture: Secondary | ICD-10-CM

## 2017-08-19 DIAGNOSIS — M25651 Stiffness of right hip, not elsewhere classified: Secondary | ICD-10-CM

## 2017-08-19 DIAGNOSIS — M6281 Muscle weakness (generalized): Secondary | ICD-10-CM

## 2017-08-19 NOTE — Therapy (Signed)
[facility] [place], [place], [address] Phone: [phone]   Fax:  [phone]  Physical Therapy Treatment  Patient Details  Name: John Morrow MRN: [id number] Date of Birth: 21-May-1951 Referring Provider: Dr. Lynne Leader   Encounter Date: 08/19/2017  PT End of Session - 08/19/17 1018    Visit Number  5    Number of Visits  6    Date for PT Re-Evaluation  09/03/17    PT Start Time  1018    PT Stop Time  1122    PT Time Calculation (min)  64 min    Activity Tolerance  Patient tolerated treatment well       Past Medical History:  Diagnosis Date  . Hyperlipemia   . Hypertension     Past Surgical History:  Procedure Laterality Date  . KNEE SURGERY      There were no vitals filed for this visit.  Subjective Assessment - 08/19/17 1019    Subjective  John Morrow reports his Rt hip pain is still very sore, he is interested in trying the dry needling and manual work. He has researched into this and iliospaos syndrome    Pertinent History  >25 yrs ago rt ACL repair    Patient Stated Goals  get rid of pain and return his mobility    Currently in Pain?  Yes    Pain Score  8     Pain Location  Buttocks    Pain Orientation  Right    Pain Descriptors / Indicators  Tightness;Dull    Pain Type  Acute pain    Pain Onset  More than a month ago    Pain Frequency  Constant    Aggravating Factors   moving around    Pain Relieving Factors  ice and TENS                      [facility] Adult PT Treatment/Exercise - 08/19/17 0001      Knee/Hip Exercises: Stretches   Quad Stretch  Right;60 seconds    Piriformis Stretch  Right;30 seconds    Other Knee/Hip Stretches  lateral hip stretch with strap Rt LE pulling across the body      Knee/Hip Exercises: Aerobic   Nustep  L5: 6 min       Modalities   Modalities  Electrical Stimulation;Moist Heat      Moist Heat Therapy   Number Minutes Moist Heat  20 Minutes     Moist Heat Location  -- Rt buttocks and quads      Electrical Stimulation   Electrical Stimulation Location  Rt glute med/min and quad    Electrical Stimulation Action  IFC    Electrical Stimulation Parameters   to tolerance     Electrical Stimulation Goals  Tone;Pain      Manual Therapy   Manual Therapy  Soft tissue mobilization;Myofascial release    Soft tissue mobilization  STM to Rt gluts, piriformis, TFL and quads.     Myofascial Release  contract relax to Rt hip flexors       Trigger Point Dry Needling - 08/19/17 1027    Consent Given?  Yes    Education Handout Provided  Yes    Muscles Treated Lower Body  Gluteus maximus;Gluteus minimus;Quadriceps;Tensor fascia lata;Adductor longus/brevius/maximus all Rt side    Gluteus Maximus Response  Palpable increased muscle length;Twitch response elicited Rt    Gluteus Minimus Response  Palpable increased muscle  length;Twitch response elicited Rt    Tensor Fascia Lata Response  Twitch response elicited;Palpable increased muscle length Rt    Quadriceps Response  Palpable increased muscle length;Twitch response elicited Rt rectus femoris    Adductor Response  Palpable increased muscle length;Twitch response elicited rt                PT Long Term Goals - 08/05/17 0943      PT LONG TERM GOAL #1   Title  I with advanced HEP for LE flexibility ( 09/03/17)     Time  6    Period  Weeks    Status  On-going      PT LONG TERM GOAL #2   Title  improve FOTO =/< 38% limited, CJ level (09/03/17)     Time  6    Period  Weeks    Status  On-going      PT LONG TERM GOAL #3   Title  improve Rt hip internal rotation and extension ROM to WNL to allow for upright ambulation( 09/03/17)     Time  6    Period  Weeks    Status  On-going      PT LONG TERM GOAL #4   Title  demo bilat hip strength WNL ( 09/03/17)     Time  6    Period  Weeks    Status  On-going      PT LONG TERM GOAL #5   Title  report =/> 75% reduction in Rt hip  pain to allow him to navigate stairs and get in/out of car easier ( 09/03/17)     Time  6    Period  Weeks    Status  On-going            Plan - 08/19/17 1144    Clinical Impression Statement  John Morrow had some good releases with treatment today, reported less pain and increased flexibility.  He is still recovering from the flare up last week.  No new goals met. Pain continues to limit his functional mobility.     Rehab Potential  Excellent    PT Frequency  1x / week    PT Duration  6 weeks    PT Treatment/Interventions  Iontophoresis 80m/ml Dexamethasone;Gait training;Neuromuscular re-education;Dry needling;Manual techniques;Patient/family education;Moist Heat;Ultrasound;Cryotherapy;Electrical Stimulation    PT Next Visit Plan  assess response to DN, FOTO and reassess    Consulted and Agree with Plan of Care  Patient       Patient will benefit from skilled therapeutic intervention in order to improve the following deficits and impairments:  Pain, Postural dysfunction, Increased muscle spasms, Decreased range of motion, Decreased strength, Obesity, Impaired flexibility, Difficulty walking  Visit Diagnosis: Pain in right hip  Stiffness of right hip, not elsewhere classified  Muscle weakness (generalized)  Abnormal posture     Problem List Patient Active Problem List   Diagnosis Date Noted  . Rotator cuff tendonitis, right 06/17/2017  . Dependent edema 05/05/2017  . Bilateral knee pain 09/25/2016  . Diverticulosis of colon without hemorrhage 06/12/2016  . BPH (benign prostatic hyperplasia) 05/29/2016  . Urine frequency 05/29/2016  . HTN (hypertension) 05/29/2016  . HLD (hyperlipidemia) 05/29/2016  . Obese 05/29/2016    SJeral PinchPT  08/19/2017, 11:46 AM  [facility] [place] [address]Phone: [phone]  Fax:  [phone] Name: John BrunettoMRN: [id number] of Birth:  [birth year]

## 2017-08-25 ENCOUNTER — Ambulatory Visit: Payer: Medicare Other | Admitting: Physical Therapy

## 2017-08-25 DIAGNOSIS — M25551 Pain in right hip: Secondary | ICD-10-CM

## 2017-08-25 DIAGNOSIS — M25651 Stiffness of right hip, not elsewhere classified: Secondary | ICD-10-CM | POA: Diagnosis not present

## 2017-08-25 DIAGNOSIS — M6281 Muscle weakness (generalized): Secondary | ICD-10-CM | POA: Diagnosis not present

## 2017-08-25 DIAGNOSIS — R293 Abnormal posture: Secondary | ICD-10-CM | POA: Diagnosis not present

## 2017-08-25 NOTE — Therapy (Signed)
[place], [place], [address] Phone: [phone]   Fax:  [phone]  Physical Therapy Treatment  Patient Details  Name: Vince Ainsley MRN: [id number] Date of Birth: 02-Sep-1951 Referring Provider: Dr Lynne Leader   Encounter Date: 08/25/2017  PT End of Session - 08/25/17 1048    Visit Number  6    Number of Visits  6    PT Start Time  7530    PT Stop Time  1201    PT Time Calculation (min)  72 min    Activity Tolerance  Patient tolerated treatment well       Past Medical History:  Diagnosis Date  . Hyperlipemia   . Hypertension     Past Surgical History:  Procedure Laterality Date  . KNEE SURGERY      There were no vitals filed for this visit.  Subjective Assessment - 08/25/17 1049    Subjective  [name] reports he had a little relief after the last treatment however the pain has returned.  He was really hopping that he would have responded better to therapy.  He returns to MD next week.     Patient Stated Goals  get rid of pain and return his mobility    Currently in Pain?  Yes    Pain Score  6     Pain Location  Buttocks    Pain Orientation  Right    Pain Descriptors / Indicators  Sore    Pain Radiating Towards  into the Rt groin    Pain Onset  More than a month ago    Pain Frequency  Constant    Aggravating Factors   moving around    Pain Relieving Factors  ice and TENs         [facility] PT Assessment - 08/25/17 0001      Assessment   Medical Diagnosis  Rt hip bursitis    Referring Provider  Dr Lynne Leader    Onset Date/Surgical Date  04/22/17    Hand Dominance  Right      Observation/Other Assessments   Focus on Therapeutic Outcomes (FOTO)   50% limited      ROM / Strength   AROM / PROM / Strength  AROM;Strength      AROM   Right/Left Hip  Right    Right Hip Extension  16 prone with some pain    Right Hip External Rotation   37    Right Hip Internal Rotation   30       Strength   Right Hip Flexion  5/5  (Pended)  with pain    Right Hip Extension  4-/5  (Pended)  with significant groin pain    Right Hip ABduction  4+/5  (Pended)  with pain    Left Hip Flexion  5/5  (Pended)     Left Hip Extension  5/5  (Pended)     Left Hip ABduction  5/5  (Pended)                   [facility] Adult PT Treatment/Exercise - 08/25/17 0001      Knee/Hip Exercises: Stretches   Other Knee/Hip Stretches  lateral hip stretch with strap Rt LE pulling across the body    Other Knee/Hip Stretches  -- butterfly stretch      Knee/Hip Exercises: Aerobic   Nustep  L5: 6 min       Modalities   Modalities  Electrical Stimulation;Moist Heat      Moist Heat Therapy   Number Minutes Moist Heat  20 Minutes    Moist Heat Location  -- Rt buttocks and hip adductors      Electrical Stimulation   Electrical Stimulation Location  Rt gluts, Rt adductors    Electrical Stimulation Action  premod    Electrical Stimulation Parameters  to tolerance    Electrical Stimulation Goals  Tone;Pain      Manual Therapy   Manual Therapy  Soft tissue mobilization;Myofascial release    Soft tissue mobilization  STM to Rt QL , gluts and adductors       Trigger Point Dry Needling - 09-17-17 1105    Consent Given?  Yes    Education Handout Provided  No    Muscles Treated Upper Body  Quadratus Lumborum Rt with goo dreleases    Muscles Treated Lower Body  Adductor longus/brevius/maximus;Gluteus maximus    Gluteus Maximus Response  Palpable increased muscle length;Twitch response elicited Rt    Adductor Response  Palpable increased muscle length;Twitch response elicited Rt with stim                PT Long Term Goals - 09-17-17 1055      PT LONG TERM GOAL #1   Title  I with advanced HEP for LE flexibility ( 09/03/17)     Status  Achieved      PT LONG TERM GOAL #2   Title  improve FOTO =/< 38% limited, CJ level (09/03/17)     Status  Not Met 50% limited      PT LONG TERM GOAL #3    Title  improve Rt hip internal rotation and extension ROM to WNL to allow for upright ambulation( 09/03/17)     Status  Achieved      PT LONG TERM GOAL #4   Title  demo bilat hip strength WNL ( 09/03/17)     Status  Partially Met      PT LONG TERM GOAL #5   Title  report =/> 75% reduction in Rt hip pain to allow him to navigate stairs and get in/out of car easier ( 09/03/17)     Status  Not Met            Plan - Sep 17, 2017 1131    Clinical Impression Statement  [name] has had limited improvement with therapy.  He has partially met his goals.  [name] has temporary relief following treatment however continues to have pain with functional mobility.  He returns to the MD next week for further assessment.     PT Next Visit Plan  discharge to HEP and return to MD for further assessment.     Consulted and Agree with Plan of Care  Patient       Patient will benefit from skilled therapeutic intervention in order to improve the following deficits and impairments:     Visit Diagnosis: Pain in right hip  Stiffness of right hip, not elsewhere classified  Muscle weakness (generalized)  Abnormal posture   [facility] PT PB G-CODES - 09/17/17 1200    Functional Assessment Tool Used   FOTO and professional judgement    Functional Limitations  Mobility: Walking and moving around    Mobility: Walking and Moving Around Goal Status [phone])  At least 20 percent but less than 40 percent impaired, limited or restricted    Mobility: Walking and Moving Around Discharge Status [phone])  At least 40 percent but less  than 60 percent impaired, limited or restricted       Problem List Patient Active Problem List   Diagnosis Date Noted  . Rotator cuff tendonitis, right 06/17/2017  . Dependent edema 05/05/2017  . Bilateral knee pain 09/25/2016  . Diverticulosis of colon without hemorrhage 06/12/2016  . BPH (benign prostatic hyperplasia) 05/29/2016  . Urine frequency 05/29/2016  . HTN (hypertension)  05/29/2016  . HLD (hyperlipidemia) 05/29/2016  . Obese 05/29/2016    Jeral Pinch PT  08/25/2017, 12:02 PM  [facility] [place], [place], [address] Phone: [phone]   Fax:  [phone]  Name: Benson Porcaro MRN: [id number] Date of Birth: [birth year]   PHYSICAL THERAPY DISCHARGE SUMMARY  Visits from Start of Care: 6  Current functional level related to goals / functional outcomes: See above for hip  ROM and strength measurements   Remaining deficits: Pain in Rt hip/buttocks to groin with functional mobility   Education / Equipment: HEP Plan: Patient agrees to discharge.  Patient goals were partially met. Patient is being discharged due to lack of progress.  ?????Returning to MD for further assessment.    Jeral Pinch, PT 08/25/17 12:03 PM

## 2017-09-01 ENCOUNTER — Encounter: Payer: Self-pay | Admitting: Family Medicine

## 2017-09-01 ENCOUNTER — Ambulatory Visit: Payer: Medicare Other | Admitting: Family Medicine

## 2017-09-01 VITALS — BP 142/85 | HR 83 | Ht 72.0 in | Wt 325.0 lb

## 2017-09-01 DIAGNOSIS — M25551 Pain in right hip: Secondary | ICD-10-CM

## 2017-09-01 NOTE — Patient Instructions (Signed)
Thank you for coming in today. Call or go to the ER if you develop a large red swollen joint with extreme pain or oozing puss.  Recheck in 4 weeks or sooner if needed. Pay attention to pain over the next few hours.  If not any better at al next step would be MRI.

## 2017-09-01 NOTE — Progress Notes (Signed)
John Morrow is a 66 y.o. male who presents to [facility] today for right hip pain.  Arjun was seen 6 weeks ago for right hip pain.  The pain was felt in the groin and in the lateral hip and low back.  X-ray at that time showed moderate femoral acetabular DJD.  His exam was somewhat consistent for arthritis but additionally somewhat consistent for trochanteric bursitis.  He has had several sessions of physical therapy.  He notes continued pain with activity, getting in and out of his car, and at night.  The pain is moderate and obnoxious and interfering with his quality of life.   Past Medical History:  Diagnosis Date  . Hyperlipemia   . Hypertension    Past Surgical History:  Procedure Laterality Date  . KNEE SURGERY     Social History   Tobacco Use  . Smoking status: Never Smoker  . Smokeless tobacco: Never Used  Substance Use Topics  . Alcohol use: Yes    Comment: occassionally     ROS:  As above   Medications: Current Outpatient Medications  Medication Sig Dispense Refill  . amLODipine (NORVASC) 10 MG tablet Take 1 tablet (10 mg total) by mouth daily. 90 tablet 1  . furosemide (LASIX) 20 MG tablet Take 1 tablet (20 mg total) by mouth daily. 90 tablet 1  . lisinopril-hydrochlorothiazide (PRINZIDE,ZESTORETIC) 20-25 MG tablet Take 1 tablet by mouth daily. 90 tablet 3  . simvastatin (ZOCOR) 40 MG tablet Take 1 tablet (40 mg total) by mouth daily at 6 PM. 90 tablet 3  . tamsulosin (FLOMAX) 0.4 MG CAPS capsule Take 1 capsule (0.4 mg total) by mouth daily. 90 capsule 3   No current facility-administered medications for this visit.    Allergies  Allergen Reactions  . Morphine And Related     Rash, vomiting, itching      Exam:  BP (!) 142/85   Pulse 83   Ht 6' (1.829 m)   Wt (!) 325 lb (147.4 kg)   BMI 44.08 kg/m  General: Well Developed, well nourished, and in no acute distress.  Neuro/Psych: Alert and oriented x3,  extra-ocular muscles intact, able to move all 4 extremities, sensation grossly intact. Skin: Warm and dry, no rashes noted.  Respiratory: Not using accessory muscles, speaking in full sentences, trachea midline.  Cardiovascular: Pulses palpable, no extremity edema. Abdomen: Does not appear distended. MSK:  Right hip: Normal-appearing. Decreased range of motion especially with internal rotation which reproduces pain. Antalgic gait present   Procedure: Real-time Ultrasound Guided Injection of right hip  Device: GE Logiq E  Images permanently stored and available for review in the ultrasound unit. Verbal informed consent obtained. Discussed risks and benefits of procedure. Warned about infection bleeding damage to structures skin hypopigmentation and fat atrophy among others. Patient expresses understanding and agreement Time-out conducted.  Noted no overlying erythema, induration, or other signs of local infection.  Skin prepped in a sterile fashion.  Local anesthesia: Topical Ethyl chloride.  With sterile technique and under real time ultrasound guidance: 80mg  depomedrol and 3ml marcaine injected easily.  Completed without difficulty  Pain immediately resolved suggesting accurate placement of the medication.  Advised to call if fevers/chills, erythema, induration, drainage, or persistent bleeding.  Images permanently stored and available for review in the ultrasound unit.  Impression: Technically successful ultrasound guided injection.     CLINICAL DATA:  Right hip pain for several months  EXAM: PELVIS - 1-2 VIEW  COMPARISON:  None.  FINDINGS: There is degenerative joint disease of the right hip with some loss of joint space, sclerosis, and spurring present. The left hip joint space appears well preserved with no significant degenerative change. The pelvic rami are intact. The SI joints appear corticated.  IMPRESSION: Moderate degenerative joint disease of  the right hip.   Electronically Signed   By: Ivar Drape M.D.   On: 07/21/2017 13:51    Assessment and Plan: 66 y.o. male with right hip pain.  Etiology is somewhat unclear.  Patient had a diagnostic and therapeutic injection today and had significant pain relief.  I believe at this point the majority of his pain is probably femoral acetabular DJD.  Will check back in 4 weeks.  Patient will go home and do normal activities today and report back his pain control while the Marcaine is effective in his hip joint.    No orders of the defined types were placed in this encounter.  No orders of the defined types were placed in this encounter.   Discussed warning signs or symptoms. Please see discharge instructions. Patient expresses understanding.

## 2017-09-10 ENCOUNTER — Encounter: Payer: Self-pay | Admitting: Family Medicine

## 2017-09-10 DIAGNOSIS — M25551 Pain in right hip: Secondary | ICD-10-CM

## 2017-09-16 ENCOUNTER — Encounter: Payer: Self-pay | Admitting: Family Medicine

## 2017-09-21 ENCOUNTER — Encounter: Payer: Self-pay | Admitting: Family Medicine

## 2017-09-21 ENCOUNTER — Ambulatory Visit: Payer: Medicare Other | Admitting: Family Medicine

## 2017-09-21 VITALS — BP 139/83 | HR 103 | Ht 71.0 in | Wt 323.0 lb

## 2017-09-21 DIAGNOSIS — R609 Edema, unspecified: Secondary | ICD-10-CM | POA: Diagnosis not present

## 2017-09-21 DIAGNOSIS — I10 Essential (primary) hypertension: Secondary | ICD-10-CM

## 2017-09-21 DIAGNOSIS — M1611 Unilateral primary osteoarthritis, right hip: Secondary | ICD-10-CM | POA: Insufficient documentation

## 2017-09-21 LAB — BASIC METABOLIC PANEL WITH GFR
BUN/Creatinine Ratio: 18 (calc) (ref 6–22)
BUN: 24 mg/dL (ref 7–25)
CO2: 31 mmol/L (ref 20–32)
CREATININE: 1.37 mg/dL — AB (ref 0.70–1.25)
Calcium: 9.8 mg/dL (ref 8.6–10.3)
Chloride: 103 mmol/L (ref 98–110)
GFR, EST AFRICAN AMERICAN: 61 mL/min/{1.73_m2} (ref >=60)
GFR, EST NON AFRICAN AMERICAN: 53 mL/min/{1.73_m2} — AB (ref >=60)
Glucose, Bld: 107 mg/dL (ref 65–139)
POTASSIUM: 3.7 mmol/L (ref 3.5–5.3)
SODIUM: 141 mmol/L (ref 135–146)

## 2017-09-21 NOTE — Patient Instructions (Addendum)
Thank you for coming in today. Check kidney function today with labs.  I will get results to you tomorrow.  Please ask Dr Mayer Camel to send note.  Recheck with me as needed.     Total Hip Replacement Total hip replacement is a surgical procedure to remove damaged bone in your hip joint and replace it with an artificial hip joint (prosthetic hip joint). The purpose of this surgery is to reduce pain and to improve your hip function. During a total hip replacement, one or both parts of the hip joint are replaced, depending on the type of joint damage you have. The hip is a ball-and-socket type of joint, and it has two main parts. The ball part of the joint (femoral head) is the top of the thigh bone (femur). The socket part of the joint is a large indent in the side of your pelvis (acetabulum) where the femur and pelvis meet. Tell a health care provider about:  Any allergies you have.  All medicines you are taking, including vitamins, herbs, eye drops, creams, and over-the-counter medicines.  Any problems you or family members have had with anesthetic medicines.  Any blood disorders you have.  Any surgeries you have had.  Any medical conditions you have. What are the risks? Generally, total hip replacement is a safe procedure. However, problems can occur, including:  Infection.  Dislocation (the ball of the hip-joint prosthesis comes out of contact with the socket).  Loosening of the piece (stem) that connects the prosthetic femoral head to the femur.  Fracture of the bone while inserting the prosthesis.  Formation of blood clots, which can break loose and travel to and injure your lungs (pulmonary embolus).  What happens before the procedure?  Plan to have someone take you home after the procedure.  Do not eat or drink anything after midnight on the night before the procedure or as directed by your health care provider.  Ask your health care provider about: ? Changing or  stopping your regular medicines. This is especially important if you are taking diabetes medicines or blood thinners. ? Taking medicines such as aspirin and ibuprofen. These medicines can thin your blood. Do not take these medicines before your procedure if your health care provider asks you not to.  Ask your health care provider about how your surgical site will be marked or identified.  You may be given antibiotic medicines to help prevent infection. What happens during the procedure?  To reduce your risk of infection: ? Your health care team will wash or sanitize their hands. ? Your skin will be washed with soap.  An IV tube will be inserted into one of your veins. You will be given one or more of the following: ? A medicine that makes you drowsy (sedative). ? A medicine that makes you fall asleep (general anesthetic). ? A medicine injected into your spine that numbs your body below the waist (spinal anesthetic).  An incision will be made in your hip. Your surgeon will take out any damaged cartilage and bone.  Your surgeon will then: ? Insert a prosthetic socket into the acetabulum of your pelvis. This is usually secured with screws. ? Remove the femoral head and replace it with a prosthetic ball and stem secured into the top of your femur. ? Place the ball into the socket and check the range of motion and stability of your new hip. ? Close the incision and apply a bandage over the surgical site. What happens after  the procedure?  You will stay in a recovery area until the medicines have worn off.  Your vital signs, such as your pulse and blood pressure, will be monitored.  Once you are awake and stable, you will be taken to a hospital room.  You may be directed to take actions to help prevent blood clots. These may include: ? Walking soon after surgery, with someone assisting you. Moving around after surgery helps to improve blood flow. ? Taking medicines to thin your blood  (anticoagulants). ? Wearing compression stockings or using different types of devices.  You will receive physical therapy until you are doing well and your health care provider feels it is safe for you to go home. This information is not intended to replace advice given to you by your health care provider. Make sure you discuss any questions you have with your health care provider. Document Released: 12/08/2000 Document Revised: 05/05/2016 Document Reviewed: 11/02/2013 Elsevier Interactive Patient Education  [employer].

## 2017-09-21 NOTE — Progress Notes (Signed)
John Morrow is a 67 y.o. male who presents to [place]: [place] today for follow-up Lasix and hip pain.  Hip pain: Khaleel has right hip pain thought originally to be combined DJD and trochanteric bursitis.  He had a course of physical therapy and November and December for trochanteric bursitis that had some limited benefit.  However he continues to experience anterior hip pain.  He had a diagnostic and therapeutic injection on December 18 which provided immediate fantastic pain relief fortunately only lasting a few weeks.  He contacted me back and I noted that based on his degenerative appearance of his hip joint on x-ray and the fact that he had excellent but short-term pain control with an intra-articular injection I suspect that he has significant pain due to hip arthritis and likely would benefit from a total hip replacement.  I referred to orthopedics and he has a follow-up appointment later this week.  He notes continued hip pain especially worse with activity.   Additionally he is here to follow-up Lasix.  He was prescribed Lasix for leg swelling and notes that he tolerates it well.  He notes that it causes significant urination but controls his leg swelling well.  He denies any chest pain palpitations or shortness of breath.  He denies any chest pain or exertional symptoms when he exerts himself.  He is able to climb 2 flights of stairs without stopping.   Past Medical History:  Diagnosis Date  . Hyperlipemia   . Hypertension    Past Surgical History:  Procedure Laterality Date  . KNEE SURGERY     Social History   Tobacco Use  . Smoking status: Never Smoker  . Smokeless tobacco: Never Used  Substance Use Topics  . Alcohol use: Yes    Comment: occassionally   family history includes Lung disease in his father and mother.  ROS as above:  Medications: Current  Outpatient Medications  Medication Sig Dispense Refill  . amLODipine (NORVASC) 10 MG tablet Take 1 tablet (10 mg total) by mouth daily. 90 tablet 1  . furosemide (LASIX) 20 MG tablet Take 1 tablet (20 mg total) by mouth daily. 90 tablet 1  . lisinopril-hydrochlorothiazide (PRINZIDE,ZESTORETIC) 20-25 MG tablet Take 1 tablet by mouth daily. 90 tablet 3  . simvastatin (ZOCOR) 40 MG tablet Take 1 tablet (40 mg total) by mouth daily at 6 PM. 90 tablet 3  . tamsulosin (FLOMAX) 0.4 MG CAPS capsule Take 1 capsule (0.4 mg total) by mouth daily. 90 capsule 3   No current facility-administered medications for this visit.    Allergies  Allergen Reactions  . Morphine And Related     Rash, vomiting, itching     Health Maintenance Health Maintenance  Topic Date Due  . COLONOSCOPY  11/06/2020  . TETANUS/TDAP  05/29/2026  . INFLUENZA VACCINE  Completed  . Hepatitis C Screening  Completed  . PNA vac Low Risk Adult  Completed     Exam:  BP 139/83   Pulse (!) 103   Ht 5\' 11"  (1.803 m)   Wt (!) 323 lb (146.5 kg)   BMI 45.05 kg/m  Gen: Well NAD HEENT: EOMI,  MMM Lungs: Normal work of breathing. CTABL Heart: RRR no MRG Abd: NABS, Soft. Nondistended, Nontender Exts: Brisk capillary refill, warm and well perfused.  No edema   No results found for this or any previous visit (from the past 72 hour(s)). No results found.  Assessment and Plan: 67 y.o. male with  Leg swelling: Significantly improved with Lasix.  Plan to check metabolic today for potassium and creatinine.  Hip pain: Agree with referral.  Appreciate insights from orthopedics.  Readiness for surgery: I am anticipating need for total hip replacement.  I think based on Mr. Ledyard ability to climb 2 flights of stairs think it safe to have surgery.   Orders Placed This Encounter  Procedures  . BASIC METABOLIC PANEL WITH GFR   No orders of the defined types were placed in this encounter.    Discussed warning signs or  symptoms. Please see discharge instructions. Patient expresses understanding.

## 2017-09-30 ENCOUNTER — Encounter: Payer: Self-pay | Admitting: Family Medicine

## 2017-10-01 ENCOUNTER — Encounter: Payer: Self-pay | Admitting: Family Medicine

## 2017-10-06 ENCOUNTER — Ambulatory Visit (INDEPENDENT_AMBULATORY_CARE_PROVIDER_SITE_OTHER): Payer: Medicare Other

## 2017-10-06 ENCOUNTER — Ambulatory Visit: Payer: Medicare Other | Admitting: Family Medicine

## 2017-10-06 ENCOUNTER — Encounter: Payer: Self-pay | Admitting: Family Medicine

## 2017-10-06 VITALS — BP 146/84 | HR 88 | Ht 71.0 in | Wt 324.0 lb

## 2017-10-06 DIAGNOSIS — M1712 Unilateral primary osteoarthritis, left knee: Secondary | ICD-10-CM | POA: Insufficient documentation

## 2017-10-06 DIAGNOSIS — M1611 Unilateral primary osteoarthritis, right hip: Secondary | ICD-10-CM

## 2017-10-06 DIAGNOSIS — M1732 Unilateral post-traumatic osteoarthritis, left knee: Secondary | ICD-10-CM | POA: Diagnosis not present

## 2017-10-06 DIAGNOSIS — M79672 Pain in left foot: Secondary | ICD-10-CM

## 2017-10-06 DIAGNOSIS — M25561 Pain in right knee: Secondary | ICD-10-CM | POA: Diagnosis not present

## 2017-10-06 DIAGNOSIS — M25562 Pain in left knee: Secondary | ICD-10-CM

## 2017-10-06 MED ORDER — TRAMADOL HCL 50 MG PO TABS: 50.0000 mg | ORAL_TABLET | Freq: Two times a day (BID) | ORAL | 0 refills | Status: DC

## 2017-10-06 MED ORDER — DICLOFENAC SODIUM 1 % TD GEL: 4.0000 g | Freq: Four times a day (QID) | TRANSDERMAL | 11 refills | Status: DC

## 2017-10-06 NOTE — Patient Instructions (Signed)
Thank you for coming in today. Work on reduced calorie diet.  Keep a food log using Myfitness pal or LoseIt apps on your phone.  Use a food scale and measuring cups.  Try to get a 1800-2000 calorie a day diet.  Recheck in 1 month.  Use tramadol sparingly.  Use voltaren gel as needed for knee and foot pain.  Get xray today.   Call or go to the ER if you develop a large red swollen joint with extreme pain or oozing puss.

## 2017-10-06 NOTE — Progress Notes (Signed)
John Morrow is a 67 y.o. male who presents to [place]: [place] today for follow up right hip pain, discuss new left knee pain and left foot pain and discuss weight loss.   Right Hip pain: John Morrow has right hip pain ultimately thought to be due to DJD failing conservative management.  He has been referred to orthopedics and has been told that he needs a total hip replacement.  He also has been told that he needs to lose about 30 pounds to have the surgery.  Left Knee Pain: John Morrow developed left knee pain weeks ago.  He thinks this is due to limping with his right hip.  He notes diffuse knee pain with mild swelling.  No radiating pain weakness or numbness.  No injury locking or catching.  He has a prior history of ACL reconstruction over 20 years ago.  Left foot pain: John Morrow notes pain across the dorsal midfoot starting a few weeks ago with he suspects limping due to his right hip pain. He denies any injury.   He was prescribed tramadol by orthopedics for the right hip he notes this is been somewhat helpful but somewhat limited as well.   Weight loss: John Morrow was told that he needs to lose 30 pounds to have right total hip replacement.  He is not currently really paying attention to his diet.   Past Medical History:  Diagnosis Date  . Hyperlipemia   . Hypertension    Past Surgical History:  Procedure Laterality Date  . KNEE SURGERY     Social History   Tobacco Use  . Smoking status: Never Smoker  . Smokeless tobacco: Never Used  Substance Use Topics  . Alcohol use: Yes    Comment: occassionally   family history includes Lung disease in his father and mother.  ROS as above:  Medications: Current Outpatient Medications  Medication Sig Dispense Refill  . amLODipine (NORVASC) 10 MG tablet Take 1 tablet (10 mg total) by mouth daily. 90 tablet 1  . furosemide  (LASIX) 20 MG tablet Take 1 tablet (20 mg total) by mouth daily. 90 tablet 1  . lisinopril-hydrochlorothiazide (PRINZIDE,ZESTORETIC) 20-25 MG tablet Take 1 tablet by mouth daily. 90 tablet 3  . simvastatin (ZOCOR) 40 MG tablet Take 1 tablet (40 mg total) by mouth daily at 6 PM. 90 tablet 3  . tamsulosin (FLOMAX) 0.4 MG CAPS capsule Take 1 capsule (0.4 mg total) by mouth daily. 90 capsule 3  . diclofenac sodium (VOLTAREN) 1 % GEL Apply 4 g topically 4 (four) times daily. To affected joint. 100 g 11  . traMADol (ULTRAM) 50 MG tablet Take 1 tablet (50 mg total) by mouth 2 (two) times daily. Chronic Pain medicine 60 tablet 0   No current facility-administered medications for this visit.    Allergies  Allergen Reactions  . Morphine And Related     Rash, vomiting, itching     Health Maintenance Health Maintenance  Topic Date Due  . COLONOSCOPY  11/06/2020  . TETANUS/TDAP  05/29/2026  . INFLUENZA VACCINE  Completed  . Hepatitis C Screening  Completed  . PNA vac Low Risk Adult  Completed     Exam:  BP (!) 146/84   Pulse 88   Ht 5\' 11"  (1.803 m)   Wt (!) 324 lb (147 kg)   BMI 45.19 kg/m  Gen: Well NAD HEENT: EOMI,  MMM Lungs: Normal work of breathing. CTABL Heart: RRR no MRG  Abd: NABS, Soft. Nondistended, Nontender Exts: Brisk capillary refill, warm and well perfused.   left knee:Marland Kitchen  Normal appearing without erythremia. Mild effusion present.  ROM 0-120 TTP medial joint line.  Stable ligament exam . Normal strength  Left foot normal-appearing Tender to palpation dorsal midfoot Pulses capillary refill and sensation are intact.   Procedure: Real-time Ultrasound Guided Injection of left knee  Device: GE Logiq E  Images permanently stored and available for review in the ultrasound unit. Verbal informed consent obtained. Discussed risks and benefits of procedure. Warned about infection bleeding damage to structures skin hypopigmentation and fat atrophy among others. Patient  expresses understanding and agreement Time-out conducted.  Noted no overlying erythema, induration, or other signs of local infection.  Skin prepped in a sterile fashion.  Local anesthesia: Topical Ethyl chloride.  With sterile technique and under real time ultrasound guidance: 80 mg depomedrol and 61ml marcaine injected easily.  Completed without difficulty  Pain immediately resolved suggesting accurate placement of the medication.  Advised to call if fevers/chills, erythema, induration, drainage, or persistent bleeding.  Images permanently stored and available for review in the ultrasound unit.  Impression: Technically successful ultrasound guided injection.    Xray left foot pending.   CLINICAL DATA:  Bilateral acute knee pain  EXAM: LEFT KNEE - COMPLETE 4+ VIEW  COMPARISON:  Right knee same day  FINDINGS: Four views of the left knee submitted. There is no acute fracture or subluxation. Narrowing of medial joint compartment. There is spurring of medial femoral condyle and medial tibial plateau. There is about 5 mm medial subluxation distal femur on tibial plateau. Narrowing of patellofemoral joint space. No joint effusion. Mild spurring of patella.  IMPRESSION: No acute fracture or subluxation. Osteoarthritic changes as described above.   Electronically Signed   By: Lahoma Crocker M.D.   On: 09/25/2016 09:24 No results found for this or any previous visit (from the past 72 hour(s)). No results found.    Assessment and Plan: 67 y.o. male with  Left knee Pain: Due to exacerbation of DJD due to limping.  Steroid injection today followed by weight loss and diclofenac gel.  Tramadol for pain control as well.  Recheck in 1 month.  Left foot pain: Likely exacerbation of DJD.  X-ray pending.  Treatment with diclofenac gel..  Right hip pain: Temporary control with tramadol until hip replacement.  Weight loss: Extensive discussion.  Work on calorie counting and  food log.  Goal calories 1800-2000 cal/day.  Recheck in 1 month to follow-up log.   Orders Placed This Encounter  Procedures  . DG Foot Complete Left    Standing Status:   Future    Number of Occurrences:   1    Standing Expiration Date:   12/05/2018    Order Specific Question:   Reason for Exam (SYMPTOM  OR DIAGNOSIS REQUIRED)    Answer:   eval foot pain    Order Specific Question:   Preferred imaging location?    Answer:   [facility]    Order Specific Question:   Radiology Contrast Protocol - do NOT remove file path    Answer:   [email or web address]   Meds ordered this encounter  Medications  . diclofenac sodium (VOLTAREN) 1 % GEL    Sig: Apply 4 g topically 4 (four) times daily. To affected joint.    Dispense:  100 g    Refill:  11  . traMADol (ULTRAM) 50 MG tablet    Sig: Take 1 tablet (50  mg total) by mouth 2 (two) times daily. Chronic Pain medicine    Dispense:  60 tablet    Refill:  0     Discussed warning signs or symptoms. Please see discharge instructions. Patient expresses understanding.  I spent 40 minutes with this patient, greater than 50% was face-to-face time counseling regarding ddx and treatment plan.   Patient unable to be researched [place] Controlled Substance Reporting System due to system outage today.

## 2017-11-06 ENCOUNTER — Ambulatory Visit: Payer: Medicare Other | Admitting: Family Medicine

## 2017-11-06 ENCOUNTER — Encounter: Payer: Self-pay | Admitting: Family Medicine

## 2017-11-06 VITALS — BP 126/71 | HR 97 | Ht 71.0 in | Wt 319.0 lb

## 2017-11-06 DIAGNOSIS — M1732 Unilateral post-traumatic osteoarthritis, left knee: Secondary | ICD-10-CM

## 2017-11-06 DIAGNOSIS — M79672 Pain in left foot: Secondary | ICD-10-CM

## 2017-11-06 DIAGNOSIS — M1611 Unilateral primary osteoarthritis, right hip: Secondary | ICD-10-CM

## 2017-11-06 NOTE — Patient Instructions (Signed)
Thank you for coming in today. Recheck in 1 month to follow along weight loss.  Try to bring the log with you. If you plateau and you are still logging 2000 calorie diet measure you food.  Recheck sooner if needed.

## 2017-11-06 NOTE — Progress Notes (Signed)
John Morrow is a 67 y.o. male who presents to [place]: [place] today for follow up morbid obesity, hip DJD and right foot pain.   Morbid Obesity: John Morrow was seen a month ago.  He has ongoing hip pain due to DJD and effectively will need a hip replacement.  However his obesity is interfering with his ability to safely have a hip replacement.  He was asked by his orthopedic surgeon to lose at least 30 pounds.  1 month ago we started a restricted calorie diet with a goal of 1800-2000 cal/day.  He has been doing food logging.  He notes that almost always he is under his goal and finds that his diet is sustainable.  He notes that he is lost about 5 pounds since last month and feels a little discouraged that he has not lost more.  He feels well otherwise no fevers or chills.  Right hip DJD: Resulting in pain.  Patient has failed conservative management and will proceed with hip replacement when weight loss is achieved.  In the meantime are trying to control pain with limited tramadol.  He uses 1-2 tabs per day which controls his pain somewhat.  He tolerates the medication well without any significant adverse side effects such as severe constipation or fatigue.   Left foot pain: John Morrow complained of left foot pain a month ago.  Etiology was unclear and thought to be related to overuse because of limping from hip and knee pain.  He had an x-ray which fortunately did not show any acute findings or severe DJD.  He was treated with diclofenac gel which she has found to be very helpful for his pain control.  He is effectively asymptomatic without pain in his left foot now.  Left knee pain: John Morrow was also seen a month ago for left knee pain thought to be related to exacerbation of existing DJD due to limping and favoring his right hip which was severely arthritic and painful.  He had a steroid  injection and trial of topical diclofenac gel.  He notes this is helped significantly and his pain is almost completely resolved.   Past Medical History:  Diagnosis Date  . Hyperlipemia   . Hypertension    Past Surgical History:  Procedure Laterality Date  . KNEE SURGERY     Social History   Tobacco Use  . Smoking status: Never Smoker  . Smokeless tobacco: Never Used  Substance Use Topics  . Alcohol use: Yes    Comment: occassionally   family history includes Lung disease in his father and mother.  ROS as above:  Medications: Current Outpatient Medications  Medication Sig Dispense Refill  . amLODipine (NORVASC) 10 MG tablet Take 1 tablet (10 mg total) by mouth daily. 90 tablet 1  . diclofenac sodium (VOLTAREN) 1 % GEL Apply 4 g topically 4 (four) times daily. To affected joint. 100 g 11  . furosemide (LASIX) 20 MG tablet Take 1 tablet (20 mg total) by mouth daily. 90 tablet 1  . lisinopril-hydrochlorothiazide (PRINZIDE,ZESTORETIC) 20-25 MG tablet Take 1 tablet by mouth daily. 90 tablet 3  . simvastatin (ZOCOR) 40 MG tablet Take 1 tablet (40 mg total) by mouth daily at 6 PM. 90 tablet 3  . tamsulosin (FLOMAX) 0.4 MG CAPS capsule Take 1 capsule (0.4 mg total) by mouth daily. 90 capsule 3  . traMADol (ULTRAM) 50 MG tablet Take 1 tablet (50 mg total) by mouth 2 (  two) times daily. Chronic Pain medicine 60 tablet 0   No current facility-administered medications for this visit.    Allergies  Allergen Reactions  . Morphine And Related     Rash, vomiting, itching     Health Maintenance Health Maintenance  Topic Date Due  . COLONOSCOPY  11/06/2020  . TETANUS/TDAP  05/29/2026  . INFLUENZA VACCINE  Completed  . Hepatitis C Screening  Completed  . PNA vac Low Risk Adult  Completed     Exam:  BP 126/71   Pulse 97   Ht 5\' 11"  (1.803 m)   Wt (!) 319 lb (144.7 kg)   BMI 44.49 kg/m   Wt Readings from Last 5 Encounters:  11/06/17 (!) 319 lb (144.7 kg)  10/06/17 (!) 324  lb (147 kg)  09/21/17 (!) 323 lb (146.5 kg)  09/01/17 (!) 325 lb (147.4 kg)  07/21/17 (!) 324 lb (147 kg)    Gen: Well NAD HEENT: EOMI,  MMM Lungs: Normal work of breathing. CTABL Heart: RRR no MRG Abd: NABS, Soft. Nondistended, Nontender Exts: Brisk capillary refill, warm and well perfused.  Antalgic gait present   X-ray left foot dated January 2019 reviewed with patient in the room. CLINICAL DATA:  Dorsal left foot pain for a few weeks.  EXAM: LEFT FOOT - COMPLETE 3+ VIEW  COMPARISON:  None.  FINDINGS: Examination demonstrates no evidence of fracture or dislocation. There is a moderate size inferior calcaneal spur. Remainder the exam is unremarkable.  IMPRESSION: No acute findings.   Electronically Signed   By: Marin Olp M.D.   On: 10/06/2017 12:10  I personally (independently) visualized and performed the interpretation of the images attached in this note.     Assessment and Plan: 67 y.o. male with  Morbid obesity: This is one of John Morrow's central medical problems.  He has successfully lost 5 pounds in the last month which is right at goal.  We expect about 1-2 pound per week of weight loss with a 2000-calorie diet. Plan to continue food log and recheck in 1 month.  If patient plateaus on weight loss will recommend rigorously weighing or measuring all food to help enhance the accuracy of food log and calorie counting.  Right hip DJD: Continue limited tramadol for pain management.  Ultimately patient will benefit from hip replacement.  Consider cane as needed.  Left foot pain: Resolved with diclofenac gel.  Continue diclofenac gel as needed.  Recheck as needed  Left knee pain: Resolved following intra-articular steroid injection and diclofenac gel.  Continue diclofenac gel as needed.  Recheck as needed.   Discussed warning signs or symptoms. Please see discharge instructions. Patient expresses understanding.

## 2017-11-30 ENCOUNTER — Other Ambulatory Visit: Payer: Self-pay | Admitting: Family Medicine

## 2017-12-04 ENCOUNTER — Ambulatory Visit: Payer: Medicare Other | Admitting: Family Medicine

## 2017-12-04 ENCOUNTER — Encounter: Payer: Self-pay | Admitting: Family Medicine

## 2017-12-04 VITALS — BP 129/82 | HR 82 | Ht 71.0 in | Wt 316.0 lb

## 2017-12-04 DIAGNOSIS — I10 Essential (primary) hypertension: Secondary | ICD-10-CM | POA: Diagnosis not present

## 2017-12-04 DIAGNOSIS — M1611 Unilateral primary osteoarthritis, right hip: Secondary | ICD-10-CM | POA: Diagnosis not present

## 2017-12-04 MED ORDER — TRAMADOL HCL 50 MG PO TABS: ORAL_TABLET | ORAL | 3 refills | Status: DC

## 2017-12-04 NOTE — Patient Instructions (Signed)
Thank you for coming in today. Use tramadol for pain as needed.  You should hear from Dr Sallyanne Havers soon.  Consider Belviq.  Weight or measure your food when logging.   Recheck with me in 1 month.   Lorcaserin extended-release tablets What is this medicine? LORCASERIN (lor ca SER in) is used to promote and maintain weight loss in obese patients. This medicine should be used with a reduced calorie diet and, if appropriate, an exercise program. This medicine may be used for other purposes; ask your health care provider or pharmacist if you have questions. COMMON BRAND NAME(S): Belviq XR What should I tell my health care provider before I take this medicine? They need to know if you have any of these conditions: -anatomical deformation of the penis, Peyronie's disease, or history of priapism (painful and prolonged erection) -diabetes -heart disease -history of blood diseases, like sickle cell anemia or leukemia -history of irregular heartbeat -kidney disease -liver disease -suicidal thoughts, plans, or attempt; a previous suicide attempt by you or a family member -an unusual or allergic reaction to lorcaserin, other medicines, foods, dyes, or preservatives -pregnant or trying to get pregnant -breast-feeding How should I use this medicine? Take this medicine by mouth with a glass of water. Follow the directions on the prescription label. Do not cut, crush or chew this medicine. You can take it with or without food. Take your medicine at regular intervals. Do not take it more often than directed. Do not stop taking except on your doctor's advice. Talk to your pediatrician regarding the use of this medicine in children. Special care may be needed. Overdosage: If you think you have taken too much of this medicine contact a poison control center or emergency room at once. NOTE: This medicine is only for you. Do not share this medicine with others. What if I miss a dose? If you miss a dose, take  it as soon as you can. If it is almost time for your next dose, take only that dose. Do not take double or extra doses. What may interact with this medicine? -cabergoline -certain medicines for depression, anxiety, or psychotic disturbances -certain medicines for erectile dysfunction -certain medicines for migraine headache like almotriptan, eletriptan, frovatriptan, naratriptan, rizatriptan, sumatriptan, zolmitriptan -dextromethorphan -linezolid -lithium -medicines for diabetes -other weight loss products -tramadol -St. John's Wort -stimulant medicines for attention disorders, weight loss, or to stay awake -tryptophan This list may not describe all possible interactions. Give your health care provider a list of all the medicines, herbs, non-prescription drugs, or dietary supplements you use. Also tell them if you smoke, drink alcohol, or use illegal drugs. Some items may interact with your medicine. What should I watch for while using this medicine? This medicine is intended to be used in addition to a healthy diet and appropriate exercise. The best results are achieved this way. Your doctor should instruct you to stop using this medicine if you do not lose a certain amount of weight within the first 12 weeks of treatment, but it is important that you do not change your dose in any way without consulting your doctor or health care professional. Visit your doctor or health care professional for regular checkups. Your doctor may order blood tests or other tests to see how you are doing. Do not drive, use machinery, or do anything that needs mental alertness until you know how this medicine affects you. This medicine may affect blood sugar levels. If you have diabetes, check with your doctor or  health care professional before you change your diet or the dose of your diabetic medicine. Patients and their families should watch out for worsening depression or thoughts of suicide. Also watch out for  sudden changes in feelings such as feeling anxious, agitated, panicky, irritable, hostile, aggressive, impulsive, severely restless, overly excited and hyperactive, or not being able to sleep. If this happens, especially at the beginning of treatment or after a change in dose, call your health care professional. Contact your doctor or health care professional right away if you are a man with an erection that lasts longer than 4 hours or if the erection becomes painful. This may be a sign of serious problem and must be treated right away to prevent permanent damage. What side effects may I notice from receiving this medicine? Side effects that you should report to your doctor or health care professional as soon as possible: -allergic reactions like skin rash, itching or hives, swelling of the face, lips, or tongue -abnormal production of milk -breast enlargement in both males and females -breathing problems -changes in emotions or moods -changes in vision -confusion -erection lasting more than 4 hours or a painful erection -fast or irregular heart beat -feeling faint or lightheaded, falls -fever or chills, sore throat -hallucination, loss of contact with reality -high or low blood pressure -menstrual changes -restlessness -signs and symptoms of low blood sugar such as feeling anxious; confusion; dizziness; increased hunger; unusually weak or tired; sweating; shakiness; cold; irritable; headache; blurred vision; fast heartbeat; loss of consciousness -slow or irregular heartbeat -stiff muscles -sweating -suicidal thoughts or actions -swelling of the ankles, feet, hands -unusually weak or tired -vomiting Side effects that usually do not require medical attention (report to your doctor or health care professional if they continue or are bothersome): -back pain -constipation -cough -dry mouth -nausea -tiredness This list may not describe all possible side effects. Call your doctor for  medical advice about side effects. You may report side effects to FDA at 1-800-FDA-1088. Where should I keep my medicine? Keep out of the reach of children. This medicine can be abused. Keep your medicine in a safe place to protect it from theft. Do not share this medicine with anyone. Selling or giving away this medicine is dangerous and against the law. Store at room temperature between 15 and 30 degrees C (59 and 86 degrees F). Throw away any unused medicine after the expiration date. NOTE: This sheet is a summary. It may not cover all possible information. If you have questions about this medicine, talk to your doctor, pharmacist, or health care provider.  2018 Elsevier/Gold Standard (2015-10-03 16:24:54)

## 2017-12-04 NOTE — Progress Notes (Signed)
John Morrow is a 67 y.o. male who presents to [place]: [place] today for right hip pain, obesity, hypertension.  Right hip pain: [name] continues to experience pain in his right hip due to end-stage DJD failed conservative management.  He has been seen by Dr. Mayer Camel at [facility] in [place] who recommends a total hip replacement however he also recommends dedicated weight loss prior to total hip replacement for safety.  [name] has talked to some friends who recommends a second opinion with Dr Sallyanne Havers at [facility].  I think this is reasonable but I am doubtful that working to get a much different plan.  Patient had a diagnostic and therapeutic hip injection which provided immediate but not long lasting pain relief.  Obesity: Westen continues to experience lifelong morbid obesity.  He is attempting to lose weight and has lost weight over the last several months.  He is doing food logging but is not measuring his food.  He estimates that his calories are around 1500 calories per day.  He notes that he is having trouble exercising because of his hip pain and lack of motivation.  Hypertension: John Morrow takes amlodipine, lisinopril/hydrochlorothiazide blood pressure.  He denies chest pain palpitations or shortness of breath.   Past Medical History:  Diagnosis Date  . Hyperlipemia   . Hypertension    Past Surgical History:  Procedure Laterality Date  . KNEE SURGERY     Social History   Tobacco Use  . Smoking status: Never Smoker  . Smokeless tobacco: Never Used  Substance Use Topics  . Alcohol use: Yes    Comment: occassionally   family history includes Lung disease in his father and mother.  ROS as above:  Medications: Current Outpatient Medications  Medication Sig Dispense Refill  . amLODipine (NORVASC) 10 MG tablet Take 1 tablet (10 mg total) by mouth  daily. 90 tablet 1  . diclofenac sodium (VOLTAREN) 1 % GEL Apply 4 g topically 4 (four) times daily. To affected joint. 100 g 11  . furosemide (LASIX) 20 MG tablet Take 1 tablet (20 mg total) by mouth daily. 90 tablet 1  . lisinopril-hydrochlorothiazide (PRINZIDE,ZESTORETIC) 20-25 MG tablet Take 1 tablet by mouth daily. 90 tablet 3  . simvastatin (ZOCOR) 40 MG tablet Take 1 tablet (40 mg total) by mouth daily at 6 PM. 90 tablet 3  . tamsulosin (FLOMAX) 0.4 MG CAPS capsule Take 1 capsule (0.4 mg total) by mouth daily. 90 capsule 3  . traMADol (ULTRAM) 50 MG tablet TAKE 1 TABLET BY MOUTH TWICE DAILY FOR CHRONIC PAIN 60 tablet 3   No current facility-administered medications for this visit.    Allergies  Allergen Reactions  . Morphine And Related     Rash, vomiting, itching     Health Maintenance Health Maintenance  Topic Date Due  . COLONOSCOPY  11/06/2020  . TETANUS/TDAP  05/29/2026  . INFLUENZA VACCINE  Completed  . Hepatitis C Screening  Completed  . PNA vac Low Risk Adult  Completed     Exam:  BP 129/82   Pulse 82   Ht 5\' 11"  (1.803 m)   Wt (!) 316 lb (143.3 kg)   BMI 44.07 kg/m  Wt Readings from Last 5 Encounters:  12/04/17 (!) 316 lb (143.3 kg)  11/06/17 (!) 319 lb (144.7 kg)  10/06/17 (!) 324 lb (147 kg)  09/21/17 (!) 323 lb (146.5 kg)  09/01/17 (!) 325 lb (147.4 kg)  Gen: Well NAD HEENT: EOMI,  MMM Lungs: Normal work of breathing. CTABL Heart: RRR no MRG Abd: NABS, Soft. Nondistended, Nontender Exts: Brisk capillary refill, warm and well perfused.  Right hip: Normal-appearing decreased range of motion.  Pain with flexion and standing from a seated position.  Antalgic gait present.   No results found for this or any previous visit (from the past 72 hour(s)). No results found.    Assessment and Plan: 67 y.o. male with  Hip pain and DJD.  End-stage failed conservative management.  Plan for second opinion however weight loss is certainly going to be  ideal prior to total hip replacement.  Continue tramadol for pain control as needed.  Obesity: Ongoing but improving.  Patient is now 316 pounds down from a max of 325.  His basic caloric needs are around 2600 cal/day without much exercise to maintain his current body weight.  If he is eating 1500 cal a day he should be losing about 2 pounds per week which he has not done.  We discussed that it is likely that he is underestimating how many calories he is eating because he is not measuring his portion sizes.  Recommended using a food scale or measuring cups with food log.  Additionally we had a discussion about medication management for weight loss.  I think Belviq would be a good option.  I provided patient information about pelvic and will be happy to prescribe before the next visit in 1 month.  Hypertension: Blood pressure was a bit elevated today on initial check but decreased on recheck.  Plan to do occasional home blood pressure logging and continue current management.   Orders Placed This Encounter  Procedures  . Ambulatory referral to Orthopedic Surgery    Referral Priority:   Routine    Referral Type:   Surgical    Referral Reason:   Specialty Services Required    Referred to Provider:   Pennie Rushing, MD    Requested Specialty:   Orthopedic Surgery    Number of Visits Requested:   1   Meds ordered this encounter  Medications  . traMADol (ULTRAM) 50 MG tablet    Sig: TAKE 1 TABLET BY MOUTH TWICE DAILY FOR CHRONIC PAIN    Dispense:  60 tablet    Refill:  3     Discussed warning signs or symptoms. Please see discharge instructions. Patient expresses understanding.

## 2017-12-19 ENCOUNTER — Other Ambulatory Visit: Payer: Self-pay | Admitting: Family Medicine

## 2017-12-19 DIAGNOSIS — I10 Essential (primary) hypertension: Secondary | ICD-10-CM

## 2018-01-04 ENCOUNTER — Ambulatory Visit: Payer: Medicare Other | Admitting: Family Medicine

## 2018-01-11 ENCOUNTER — Encounter: Payer: Self-pay | Admitting: Family Medicine

## 2018-01-11 ENCOUNTER — Ambulatory Visit: Payer: Medicare Other | Admitting: Family Medicine

## 2018-01-11 VITALS — BP 133/82 | HR 92 | Ht 70.0 in | Wt 304.0 lb

## 2018-01-11 DIAGNOSIS — I10 Essential (primary) hypertension: Secondary | ICD-10-CM

## 2018-01-11 DIAGNOSIS — M1611 Unilateral primary osteoarthritis, right hip: Secondary | ICD-10-CM

## 2018-01-11 NOTE — Progress Notes (Signed)
John Morrow is a 67 y.o. male who presents to [place]: [place] today for right hip pain, obesity, hypertension.  Right hip pain: John Morrow continues to experience pain in his right hip due to end-stage DJD failed conservative management.  He has not been seen by 2 orthopedic surgeons who recommended total hip replacement when BMI less than 40.  Pain currently managed with tramadol moderately well.  Patient continues to take ibuprofen and Tylenol as well.  He continues to experience severe pain at times.  Obesity: John Morrow continues to experience lifelong morbid obesity.  He is attempting to lose weight and has lost weight over the last several months.  He is trying for 1500 cal a day and has lost 10 pounds since his last visit.  Hypertension: John Morrow takes amlodipine, lisinopril/hydrochlorothiazide blood pressure.  He denies chest pain palpitations or shortness of breath.   Past Medical History:  Diagnosis Date  . Hyperlipemia   . Hypertension    Past Surgical History:  Procedure Laterality Date  . KNEE SURGERY     Social History   Tobacco Use  . Smoking status: Never Smoker  . Smokeless tobacco: Never Used  Substance Use Topics  . Alcohol use: Yes    Comment: occassionally   family history includes Lung disease in his father and mother.  ROS as above:  Medications: Current Outpatient Medications  Medication Sig Dispense Refill  . amLODipine (NORVASC) 10 MG tablet TAKE 1 TABLET BY MOUTH ONCE DAILY 90 tablet 1  . diclofenac sodium (VOLTAREN) 1 % GEL Apply 4 g topically 4 (four) times daily. To affected joint. 100 g 11  . furosemide (LASIX) 20 MG tablet Take 1 tablet (20 mg total) by mouth daily. 90 tablet 1  . lisinopril-hydrochlorothiazide (PRINZIDE,ZESTORETIC) 20-25 MG tablet Take 1 tablet by mouth daily. 90 tablet 3  . simvastatin (ZOCOR) 40 MG tablet Take 1  tablet (40 mg total) by mouth daily at 6 PM. 90 tablet 3  . tamsulosin (FLOMAX) 0.4 MG CAPS capsule Take 1 capsule (0.4 mg total) by mouth daily. 90 capsule 3  . traMADol (ULTRAM) 50 MG tablet TAKE 1 TABLET BY MOUTH TWICE DAILY FOR CHRONIC PAIN 60 tablet 3   No current facility-administered medications for this visit.    Allergies  Allergen Reactions  . Morphine And Related     Rash, vomiting, itching     Health Maintenance Health Maintenance  Topic Date Due  . INFLUENZA VACCINE  04/15/2018  . COLONOSCOPY  11/06/2020  . TETANUS/TDAP  05/29/2026  . Hepatitis C Screening  Completed  . PNA vac Low Risk Adult  Completed     Exam:  BP 133/82   Pulse 92   Ht 5\' 10"  (1.778 m)   Wt (!) 304 lb (137.9 kg)   BMI 43.62 kg/m  Wt Readings from Last 5 Encounters:  01/11/18 (!) 304 lb (137.9 kg)  12/04/17 (!) 316 lb (143.3 kg)  11/06/17 (!) 319 lb (144.7 kg)  10/06/17 (!) 324 lb (147 kg)  09/21/17 (!) 323 lb (146.5 kg)    Gen: Well NAD HEENT: EOMI,  MMM Lungs: Normal work of breathing. CTABL Heart: RRR no MRG Abd: NABS, Soft. Nondistended, Nontender Exts: Brisk capillary refill, warm and well perfused.  Right hip: Normal-appearing decreased range of motion.  Pain with flexion and standing from a seated position.  Antalgic gait present.   No results found for this or any previous visit (from the  past 72 hour(s)). No results found.    Assessment and Plan: 67 y.o. male with  Hip pain and DJD.  Plan for continued current management with tramadol as ibuprofen for pain control. Plan for THR when BMI <40.   Obesity: Ongoing but improving.  Plan to continue diet control.  Discussed medications as this will help reach his goal faster.  Patient will recheck to his health insurance company.  We cannot get Belviq or Saxenda covered would consider other GLP-1's. John Morrow does have a recent glucose <100 on labs therefore GLP1 may be reasonable for hyperglycemia.   Hypertension: Doing well  with medication. Continue weight loss.    No orders of the defined types were placed in this encounter.  No orders of the defined types were placed in this encounter.    Discussed warning signs or symptoms. Please see discharge instructions. Patient expresses understanding.

## 2018-01-11 NOTE — Patient Instructions (Addendum)
Thank you for coming in today. Continue weight loss.   Consider Saxenda, Belviq and Qsymia for weight loss.  If we cannot get any of them covered we can use medicine like  Trulicity, Bydureon, Victoza, Ozempic for diabetes but also helping with weight loss.   Recheck monthly.   If I had my pick for medicine Belviq pill or Saxenda injection.

## 2018-01-12 MED ORDER — DULAGLUTIDE 0.75 MG/0.5ML ~~LOC~~ SOAJ: 0.5000 mL | SUBCUTANEOUS | 11 refills | Status: DC

## 2018-01-14 ENCOUNTER — Encounter: Payer: Self-pay | Admitting: Family Medicine

## 2018-01-14 ENCOUNTER — Other Ambulatory Visit: Payer: Self-pay | Admitting: Family Medicine

## 2018-01-14 DIAGNOSIS — R609 Edema, unspecified: Secondary | ICD-10-CM

## 2018-02-10 ENCOUNTER — Encounter: Payer: Self-pay | Admitting: Family Medicine

## 2018-02-10 ENCOUNTER — Other Ambulatory Visit: Payer: Self-pay | Admitting: Family Medicine

## 2018-02-10 ENCOUNTER — Ambulatory Visit: Payer: Medicare Other | Admitting: Family Medicine

## 2018-02-10 VITALS — BP 124/82 | HR 86 | Ht 70.0 in | Wt 299.0 lb

## 2018-02-10 DIAGNOSIS — M1611 Unilateral primary osteoarthritis, right hip: Secondary | ICD-10-CM

## 2018-02-10 MED ORDER — TAMSULOSIN HCL 0.4 MG PO CAPS: 0.4000 mg | ORAL_CAPSULE | Freq: Every day | ORAL | 3 refills | Status: DC

## 2018-02-10 NOTE — Patient Instructions (Addendum)
Thank you for coming in today. Take a holiday of about 1 week off Trulicity. Then try restarting.,  See if your symptoms change.  If you continue to have discomfort my next step is to prescribe acid blocking medicine and order a few labs to evaluate for pancreatitis.   Recheck in 1 month.

## 2018-02-10 NOTE — Progress Notes (Signed)
John Morrow is a 67 y.o. male who presents to [facility]: [place] today for follow-up weight loss.  John Morrow continues to attempt to lose weight in preparation for a hip replacement surgery.  At the last visit he continued to eat a lower carbohydrate and lower calorie diet.  Additionally he was started on Trulicity for weight loss and for prediabetes history.  He notes that he has had increased satiety as well as some stomach upset and discomfort.  He last gave himself a Trulicity injection 8 days ago.  He denies severe abdominal pain vomiting or diarrhea.  He notes continued bothersome right hip pain.  He gets about 3 or so hours of pain relief with tramadol.  He continues to try to stay active.   ROS as above:  Exam:  BP 124/82   Pulse 86   Ht 5\' 10"  (1.778 m)   Wt 299 lb (135.6 kg)   BMI 42.90 kg/m   Wt Readings from Last 5 Encounters:  02/10/18 299 lb (135.6 kg)  01/11/18 (!) 304 lb (137.9 kg)  12/04/17 (!) 316 lb (143.3 kg)  11/06/17 (!) 319 lb (144.7 kg)  10/06/17 (!) 324 lb (147 kg)    Gen: Well NAD HEENT: EOMI,  MMM Lungs: Normal work of breathing. CTABL Heart: RRR no MRG Abd: NABS, Soft. Nondistended, Nontender Exts: Brisk capillary refill, warm and well perfused.  Antalgic gait    Assessment and Plan: 67 y.o. male with  Morbid obesity: Continuing to lose weight approximately 25 pounds since January.  Goal weight is 270 pounds.  Continue food log and calorie restricted diet.  Recommend a one-week hold on Trulicity.  If abdominal symptoms resolve then returned when he restarts Trulicity in 1 week next step would be trial of PPI and check metabolic panel and lipase to evaluate for pancreatitis or transaminitis.  Hip pain: Continue activity as tolerated and sparing use of tramadol.  Recheck in 1 month   No orders of the defined types were placed in this  encounter.  No orders of the defined types were placed in this encounter.    Historical information moved to improve visibility of documentation.  Past Medical History:  Diagnosis Date  . Hyperlipemia   . Hypertension    Past Surgical History:  Procedure Laterality Date  . KNEE SURGERY     Social History   Tobacco Use  . Smoking status: Never Smoker  . Smokeless tobacco: Never Used  Substance Use Topics  . Alcohol use: Yes    Comment: occassionally   family history includes Lung disease in his father and mother.  Medications: Current Outpatient Medications  Medication Sig Dispense Refill  . amLODipine (NORVASC) 10 MG tablet TAKE 1 TABLET BY MOUTH ONCE DAILY 90 tablet 1  . diclofenac sodium (VOLTAREN) 1 % GEL Apply 4 g topically 4 (four) times daily. To affected joint. 100 g 11  . Dulaglutide (TRULICITY) 1.30 QM/5.7QI SOPN Inject 0.5 mLs into the skin once a week. 4 pen 11  . furosemide (LASIX) 20 MG tablet TAKE 1 TABLET BY MOUTH ONCE DAILY 90 tablet 1  . lisinopril-hydrochlorothiazide (PRINZIDE,ZESTORETIC) 20-25 MG tablet Take 1 tablet by mouth daily. 90 tablet 3  . simvastatin (ZOCOR) 40 MG tablet Take 1 tablet (40 mg total) by mouth daily at 6 PM. 90 tablet 3  . tamsulosin (FLOMAX) 0.4 MG CAPS capsule Take 1 capsule (0.4 mg total) by mouth daily. 90 capsule 3  .  traMADol (ULTRAM) 50 MG tablet TAKE 1 TABLET BY MOUTH TWICE DAILY FOR CHRONIC PAIN 60 tablet 3   No current facility-administered medications for this visit.    Allergies  Allergen Reactions  . Morphine And Related     Rash, vomiting, itching     Health Maintenance Health Maintenance  Topic Date Due  . INFLUENZA VACCINE  04/15/2018  . COLONOSCOPY  11/06/2020  . TETANUS/TDAP  05/29/2026  . Hepatitis C Screening  Completed  . PNA vac Low Risk Adult  Completed    Discussed warning signs or symptoms. Please see discharge instructions. Patient expresses understanding.

## 2018-02-15 ENCOUNTER — Encounter: Payer: Self-pay | Admitting: Family Medicine

## 2018-02-16 MED ORDER — OMEPRAZOLE 40 MG PO CPDR: 40.0000 mg | DELAYED_RELEASE_CAPSULE | Freq: Every day | ORAL | 3 refills | Status: DC

## 2018-03-10 ENCOUNTER — Ambulatory Visit: Payer: Medicare Other | Admitting: Family Medicine

## 2018-03-17 ENCOUNTER — Ambulatory Visit: Payer: Medicare Other | Admitting: Family Medicine

## 2018-03-17 ENCOUNTER — Encounter: Payer: Self-pay | Admitting: Family Medicine

## 2018-03-17 VITALS — BP 121/74 | HR 87 | Wt 302.0 lb

## 2018-03-17 DIAGNOSIS — E782 Mixed hyperlipidemia: Secondary | ICD-10-CM

## 2018-03-17 DIAGNOSIS — M1611 Unilateral primary osteoarthritis, right hip: Secondary | ICD-10-CM | POA: Diagnosis not present

## 2018-03-17 DIAGNOSIS — I10 Essential (primary) hypertension: Secondary | ICD-10-CM

## 2018-03-17 DIAGNOSIS — N400 Enlarged prostate without lower urinary tract symptoms: Secondary | ICD-10-CM | POA: Diagnosis not present

## 2018-03-17 DIAGNOSIS — N185 Chronic kidney disease, stage 5: Secondary | ICD-10-CM

## 2018-03-17 DIAGNOSIS — I120 Hypertensive chronic kidney disease with stage 5 chronic kidney disease or end stage renal disease: Secondary | ICD-10-CM | POA: Insufficient documentation

## 2018-03-17 LAB — COMPLETE METABOLIC PANEL WITH GFR
AG RATIO: 1.7 (calc) (ref 1.0–2.5)
ALKALINE PHOSPHATASE (APISO): 55 U/L (ref 40–115)
ALT: 14 U/L (ref 9–46)
AST: 15 U/L (ref 10–35)
Albumin: 4.3 g/dL (ref 3.6–5.1)
BILIRUBIN TOTAL: 0.6 mg/dL (ref 0.2–1.2)
BUN: 25 mg/dL (ref 7–25)
CALCIUM: 9.3 mg/dL (ref 8.6–10.3)
CHLORIDE: 102 mmol/L (ref 98–110)
CO2: 29 mmol/L (ref 20–32)
Creat: 1.16 mg/dL (ref 0.70–1.25)
GFR, EST NON AFRICAN AMERICAN: 64 mL/min/{1.73_m2} (ref >=60)
GFR, Est African American: 75 mL/min/{1.73_m2} (ref >=60)
GLOBULIN: 2.6 g/dL (ref 1.9–3.7)
Glucose, Bld: 115 mg/dL — ABNORMAL HIGH (ref 65–99)
POTASSIUM: 3.7 mmol/L (ref 3.5–5.3)
SODIUM: 139 mmol/L (ref 135–146)
Total Protein: 6.9 g/dL (ref 6.1–8.1)

## 2018-03-17 LAB — CBC
HCT: 43.3 % (ref 38.5–50.0)
Hemoglobin: 14.6 g/dL (ref 13.2–17.1)
MCH: 28.6 pg (ref 27.0–33.0)
MCHC: 33.7 g/dL (ref 32.0–36.0)
MCV: 84.7 fL (ref 80.0–100.0)
MPV: 10.6 fL (ref 7.5–12.5)
PLATELETS: 251 10*3/uL (ref 140–400)
RBC: 5.11 10*6/uL (ref 4.20–5.80)
RDW: 12.5 % (ref 11.0–15.0)
WBC: 6.3 10*3/uL (ref 3.8–10.8)

## 2018-03-17 LAB — LIPID PANEL W/REFLEX DIRECT LDL
CHOLESTEROL: 120 mg/dL (ref <200)
HDL: 40 mg/dL — AB (ref >40)
LDL Cholesterol (Calc): 62 mg/dL (calc)
Non-HDL Cholesterol (Calc): 80 mg/dL (calc) (ref <130)
TRIGLYCERIDES: 98 mg/dL (ref <150)
Total CHOL/HDL Ratio: 3 (calc) (ref <5.0)

## 2018-03-17 LAB — PSA: PSA: 1.3 ng/mL (ref <=4.0)

## 2018-03-17 NOTE — Progress Notes (Signed)
John Morrow is a 67 y.o. male who presents to [place]: [place] today for follow-up obesity and hip pain.  John Morrow has been seen multiple times recently for medical supervised weight management.  His goal is to get his BMI less than 40 so that he can have a right hip replacement.  He notes continued bothered right hip pain worse with ambulation better with rest.  He denies any radiating pain or numbness.  He does note that he is limping and has difficulty exercising.  For weight John Morrow has plateaued over the last several months.  He is not food logging regularly but is trying to eat a healthier diet low in calories.  His goal is about 2000 cal a day.  He was tried on Trulicity about 2 months ago and had some stomach upset.  He notes off of the medicine it did not change much at all.  He like to retry it again if possible.  Hyperlipidemia: John Morrow takes medications listed below and tolerates them well with no issues.  No significant muscle aches or pains.  Hypertension: John Morrow also takes medications listed below.  No chest pain palpitations or shortness of breath.  ROS as above:  Exam:  BP 121/74   Pulse 87   Wt (!) 302 lb (137 kg)   BMI 43.33 kg/m   Wt Readings from Last 5 Encounters:  03/17/18 (!) 302 lb (137 kg)  02/10/18 299 lb (135.6 kg)  01/11/18 (!) 304 lb (137.9 kg)  12/04/17 (!) 316 lb (143.3 kg)  11/06/17 (!) 319 lb (144.7 kg)    Gen: Well NAD HEENT: EOMI,  MMM Lungs: Normal work of breathing. CTABL Heart: RRR no MRG Abd: NABS, Soft. Nondistended, Nontender Exts: Brisk capillary refill, warm and well perfused.   Lab and Radiology Results Labs from August 2018 reviewed.     Assessment and Plan: 67 y.o. male with  Right hip pain: Continuous.  Goal to get BMI less than 40 for total hip replacement.  Continue tramadol and intermittent NSAIDs.  Commend water  aerobics or swimming.  Morbid obesity: Discussed options.  Plan to restart Trulicity and should patient  several websites that will generate a meal plan for an 1800 -calorie/day diet.  Check 1 month.  Hyperlipidemia and hypertension: Stable recheck basic fasting labs listed below.  BPH: Recheck PSA.   Orders Placed This Encounter  Procedures  . CBC  . COMPLETE METABOLIC PANEL WITH GFR  . Lipid Panel w/reflex Direct LDL  . PSA   No orders of the defined types were placed in this encounter.    Historical information moved to improve visibility of documentation.  Past Medical History:  Diagnosis Date  . Hyperlipemia   . Hypertension    Past Surgical History:  Procedure Laterality Date  . KNEE SURGERY     Social History   Tobacco Use  . Smoking status: Never Smoker  . Smokeless tobacco: Never Used  Substance Use Topics  . Alcohol use: Yes    Comment: occassionally   family history includes Lung disease in his father and mother.  Medications: Current Outpatient Medications  Medication Sig Dispense Refill  . amLODipine (NORVASC) 10 MG tablet TAKE 1 TABLET BY MOUTH ONCE DAILY 90 tablet 1  . diclofenac sodium (VOLTAREN) 1 % GEL Apply 4 g topically 4 (four) times daily. To affected joint. 100 g 11  . furosemide (LASIX) 20 MG tablet TAKE 1 TABLET BY MOUTH ONCE  DAILY 90 tablet 1  . lisinopril-hydrochlorothiazide (PRINZIDE,ZESTORETIC) 20-25 MG tablet TAKE 1 TABLET BY MOUTH ONCE DAILY 90 tablet 3  . omeprazole (PRILOSEC) 40 MG capsule Take 1 capsule (40 mg total) by mouth daily. 90 capsule 3  . simvastatin (ZOCOR) 40 MG tablet Take 1 tablet (40 mg total) by mouth daily at 6 PM. 90 tablet 3  . tamsulosin (FLOMAX) 0.4 MG CAPS capsule Take 1 capsule (0.4 mg total) by mouth daily. 90 capsule 3  . traMADol (ULTRAM) 50 MG tablet TAKE 1 TABLET BY MOUTH TWICE DAILY FOR CHRONIC PAIN 60 tablet 3  . Dulaglutide (TRULICITY) 4.12 IN/8.6VE SOPN Inject 0.5 mLs into the skin once a week.  (Patient not taking: Reported on 03/17/2018) 4 pen 11   No current facility-administered medications for this visit.    Allergies  Allergen Reactions  . Morphine And Related     Rash, vomiting, itching      Discussed warning signs or symptoms. Please see discharge instructions. Patient expresses understanding.

## 2018-03-17 NOTE — Patient Instructions (Addendum)
Thank you for coming in today. Consider medical weight management Call [phone] to make an appointment. Appointments can be made directly or via referral. All patients are required to attend a mandatory information session before starting with our clinic.  Also consider a 1800 calorie meal plan at [email or web address]  Recheck with me in 1 month if not establishing with Dr Migdalia Dk clinic.   Restart Trulicity.   Try water exercises.  Upper body exercises.

## 2018-04-02 ENCOUNTER — Emergency Department
Admission: EM | Admit: 2018-04-02 | Discharge: 2018-04-02 | Disposition: A | Discharge status: Home / Self Care | Payer: Medicare Other | Source: Home / Self Care | Attending: Family Medicine | Admitting: Family Medicine

## 2018-04-02 ENCOUNTER — Emergency Department (INDEPENDENT_AMBULATORY_CARE_PROVIDER_SITE_OTHER): Payer: Medicare Other

## 2018-04-02 ENCOUNTER — Other Ambulatory Visit: Payer: Self-pay

## 2018-04-02 ENCOUNTER — Encounter: Payer: Self-pay | Admitting: Emergency Medicine

## 2018-04-02 DIAGNOSIS — R1012 Left upper quadrant pain: Secondary | ICD-10-CM

## 2018-04-02 DIAGNOSIS — R109 Unspecified abdominal pain: Secondary | ICD-10-CM | POA: Diagnosis not present

## 2018-04-02 DIAGNOSIS — R197 Diarrhea, unspecified: Secondary | ICD-10-CM | POA: Diagnosis not present

## 2018-04-02 LAB — POCT URINALYSIS DIP (MANUAL ENTRY)
Bilirubin, UA: NEGATIVE
GLUCOSE UA: NEGATIVE mg/dL
Ketones, POC UA: NEGATIVE mg/dL
LEUKOCYTES UA: NEGATIVE
Nitrite, UA: NEGATIVE
PROTEIN UA: NEGATIVE mg/dL
SPEC GRAV UA: 1.02 (ref 1.010–1.025)
UROBILINOGEN UA: 1 U/dL
pH, UA: 6.5 (ref 5.0–8.0)

## 2018-04-02 LAB — POCT CBC W AUTO DIFF (K'VILLE URGENT CARE)

## 2018-04-02 NOTE — ED Triage Notes (Signed)
LLQ pain started last night. I can feel a knot.

## 2018-04-02 NOTE — ED Provider Notes (Signed)
[name]    CSN: [id number] Arrival date & time: 04/02/18  1355     History   Chief Complaint Chief Complaint  Patient presents with  . Abdominal Pain    HPI John Morrow is a 67 y.o. male.   The history is provided by the patient.  Abdominal Pain  Pain location:  LUQ Pain quality: aching   Pain radiates to:  Does not radiate Pain severity:  Severe Onset quality:  Sudden Duration:  1 day Timing:  Constant Chronicity:  New Relieved by:  Nothing Worsened by:  Nothing Ineffective treatments:  None tried Associated symptoms: nausea     Past Medical History:  Diagnosis Date  . Hyperlipemia   . Hypertension     Patient Active Problem List   Diagnosis Date Noted  . Left knee DJD 10/06/2017  . Arthritis of right hip 09/21/2017  . Dependent edema 05/05/2017  . Bilateral knee pain 09/25/2016  . Diverticulosis of colon without hemorrhage 06/12/2016  . BPH (benign prostatic hyperplasia) 05/29/2016  . Urine frequency 05/29/2016  . HTN (hypertension) 05/29/2016  . HLD (hyperlipidemia) 05/29/2016  . Morbid obesity ([place]) 05/29/2016    Past Surgical History:  Procedure Laterality Date  . KNEE SURGERY         Home Medications    Prior to Admission medications   Medication Sig Start Date End Date Taking? Authorizing Provider  amLODipine (NORVASC) 10 MG tablet TAKE 1 TABLET BY MOUTH ONCE DAILY 12/21/17   Gregor Hams, MD  diclofenac sodium (VOLTAREN) 1 % GEL Apply 4 g topically 4 (four) times daily. To affected joint. 10/06/17   Gregor Hams, MD  Dulaglutide (TRULICITY) 8.10 FB/5.1WC SOPN Inject 0.5 mLs into the skin once a week. 01/12/18   Gregor Hams, MD  furosemide (LASIX) 20 MG tablet TAKE 1 TABLET BY MOUTH ONCE DAILY 01/14/18   Gregor Hams, MD  lisinopril-hydrochlorothiazide (PRINZIDE,ZESTORETIC) 20-25 MG tablet TAKE 1 TABLET BY MOUTH ONCE DAILY 02/10/18   Gregor Hams, MD  omeprazole (PRILOSEC) 40 MG capsule Take 1 capsule (40 mg total) by  mouth daily. 02/16/18   Gregor Hams, MD  simvastatin (ZOCOR) 40 MG tablet Take 1 tablet (40 mg total) by mouth daily at 6 PM. 05/08/17   Gregor Hams, MD  tamsulosin (FLOMAX) 0.4 MG CAPS capsule Take 1 capsule (0.4 mg total) by mouth daily. 02/10/18   Gregor Hams, MD  traMADol Veatrice [place]) 50 MG tablet TAKE 1 TABLET BY MOUTH TWICE DAILY FOR CHRONIC PAIN 12/04/17   Gregor Hams, MD    Family History Family History  Problem Relation Age of Onset  . Lung disease Mother   . Lung disease Father     Social History Social History   Tobacco Use  . Smoking status: Never Smoker  . Smokeless tobacco: Never Used  Substance Use Topics  . Alcohol use: Yes    Comment: occassionally  . Drug use: No     Allergies   Morphine and related   Review of Systems Review of Systems  Gastrointestinal: Positive for abdominal pain and nausea.  All other systems reviewed and are negative.    Physical Exam Triage Vital Signs ED Triage Vitals  Enc Vitals Group     BP 04/02/18 1416 (!) 155/81     Pulse Rate 04/02/18 1416 81     Resp --      Temp 04/02/18 1416 99.3 F (37.4 C)     Temp Source 04/02/18  1416 Oral     SpO2 04/02/18 1416 97 %     Weight 04/02/18 1418 276 lb (125.2 kg)     Height 04/02/18 1418 5\' 11"  (1.803 m)     Head Circumference --      Peak Flow --      Pain Score 04/02/18 1418 8     Pain Loc --      Pain Edu? --      Excl. in [place]? --    No data found.  Updated Vital Signs BP (!) 155/81 (BP Location: Right Arm)   Pulse 81   Temp 99.3 F (37.4 C) (Oral)   Ht 5\' 11"  (1.803 m)   Wt 276 lb (125.2 kg)   SpO2 97%   BMI 38.49 kg/m   Visual Acuity Right Eye Distance:   Left Eye Distance:   Bilateral Distance:    Right Eye Near:   Left Eye Near:    Bilateral Near:     Physical Exam  Constitutional: He appears well-developed and well-nourished.  HENT:  Head: Normocephalic.  Eyes: Pupils are equal, round, and reactive to light.  Cardiovascular: Normal rate.    Pulmonary/Chest: Effort normal.  Abdominal: Normal appearance and bowel sounds are normal. He exhibits no mass.  Neurological: He is alert.  Skin: Skin is warm.  Nursing note and vitals reviewed.    UC Treatments / Results  Labs (all labs ordered are listed, but only abnormal results are displayed) Labs Reviewed  POCT CBC W AUTO DIFF ([place])    EKG None  Radiology No results found.  Procedures Procedures (including critical care time)  Medications Ordered in UC Medications - No data to display  Initial Impression / Assessment and Plan / UC Course  I have reviewed the triage vital signs and the nursing notes.  Pertinent labs & imaging results that were available during my care of the patient were reviewed by me and considered in my medical decision making (see chart for details).    Pt's care turned over to Dr. Assunta Found.  Final Clinical Impressions(s) / UC Diagnoses   Final diagnoses:  None   Discharge Instructions   None    ED Prescriptions    None     Controlled Substance Prescriptions [place] Controlled Substance Registry consulted? Not Applicable   Fransico Meadow, [place] 04/02/18 1459

## 2018-04-02 NOTE — ED Provider Notes (Signed)
[name]    CSN: [id number] Arrival date & time: 04/02/18  1355     History   Chief Complaint Chief Complaint  Patient presents with  . Abdominal Pain    HPI John Morrow is a 67 y.o. male.   Patient complains of onset of sharp left upper quadrant abdominal pain yesterday afternoon.  The pain does not radiate, and is somewhat worse with movement and deep inspiration.  He denies fevers, chills, and sweats, and no nausea/vomiting.  He states that he had a very loose bowel movement today, but bowel movement yesterday was normal.  No other recent changes in bowel movements.  No urinary symptoms.  The history is provided by the patient.  Abdominal Pain  Pain location:  LUQ Pain quality: sharp   Pain radiates to:  Does not radiate Pain severity:  Moderate Onset quality:  Sudden Duration:  1 day Timing:  Constant Progression:  Unchanged Chronicity:  New Context: not awakening from sleep, not diet changes, not eating, not laxative use, not medication withdrawal, not previous surgeries, not recent illness, not recent travel, not sick contacts, not suspicious food intake and not trauma   Relieved by:  None tried Worsened by:  Coughing, deep breathing, movement, palpation and position changes Ineffective treatments:  None tried Associated symptoms: diarrhea   Associated symptoms: no anorexia, no belching, no chest pain, no chills, no constipation, no cough, no dysuria, no fatigue, no fever, no flatus, no hematemesis, no hematochezia, no hematuria, no melena, no nausea, no shortness of breath and no sore throat   Risk factors: obesity     Past Medical History:  Diagnosis Date  . Hyperlipemia   . Hypertension     Patient Active Problem List   Diagnosis Date Noted  . Left knee DJD 10/06/2017  . Arthritis of right hip 09/21/2017  . Dependent edema 05/05/2017  . Bilateral knee pain 09/25/2016  . Diverticulosis of colon without hemorrhage 06/12/2016  . BPH  (benign prostatic hyperplasia) 05/29/2016  . Urine frequency 05/29/2016  . HTN (hypertension) 05/29/2016  . HLD (hyperlipidemia) 05/29/2016  . Morbid obesity ([place]) 05/29/2016    Past Surgical History:  Procedure Laterality Date  . KNEE SURGERY         Home Medications    Prior to Admission medications   Medication Sig Start Date End Date Taking? Authorizing Provider  amLODipine (NORVASC) 10 MG tablet TAKE 1 TABLET BY MOUTH ONCE DAILY 12/21/17   Gregor Hams, MD  diclofenac sodium (VOLTAREN) 1 % GEL Apply 4 g topically 4 (four) times daily. To affected joint. 10/06/17   Gregor Hams, MD  Dulaglutide (TRULICITY) 9.98 PJ/8.2NK SOPN Inject 0.5 mLs into the skin once a week. 01/12/18   Gregor Hams, MD  furosemide (LASIX) 20 MG tablet TAKE 1 TABLET BY MOUTH ONCE DAILY 01/14/18   Gregor Hams, MD  lisinopril-hydrochlorothiazide (PRINZIDE,ZESTORETIC) 20-25 MG tablet TAKE 1 TABLET BY MOUTH ONCE DAILY 02/10/18   Gregor Hams, MD  omeprazole (PRILOSEC) 40 MG capsule Take 1 capsule (40 mg total) by mouth daily. 02/16/18   Gregor Hams, MD  simvastatin (ZOCOR) 40 MG tablet Take 1 tablet (40 mg total) by mouth daily at 6 PM. 05/08/17   Gregor Hams, MD  tamsulosin (FLOMAX) 0.4 MG CAPS capsule Take 1 capsule (0.4 mg total) by mouth daily. 02/10/18   Gregor Hams, MD  traMADol (ULTRAM) 50 MG tablet TAKE 1 TABLET BY MOUTH TWICE DAILY FOR CHRONIC PAIN 12/04/17  Gregor Hams, MD    Family History Family History  Problem Relation Age of Onset  . Lung disease Mother   . Lung disease Father     Social History Social History   Tobacco Use  . Smoking status: Never Smoker  . Smokeless tobacco: Never Used  Substance Use Topics  . Alcohol use: Yes    Comment: occassionally  . Drug use: No     Allergies   Morphine and related   Review of Systems Review of Systems  Constitutional: Negative for chills, fatigue and fever.  HENT: Negative for sore throat.   Respiratory: Negative for cough  and shortness of breath.   Cardiovascular: Negative for chest pain.  Gastrointestinal: Positive for abdominal pain and diarrhea. Negative for anorexia, constipation, flatus, hematemesis, hematochezia, melena and nausea.  Genitourinary: Negative for dysuria and hematuria.     Physical Exam Triage Vital Signs ED Triage Vitals  Enc Vitals Group     BP 04/02/18 1416 (!) 155/81     Pulse Rate 04/02/18 1416 81     Resp --      Temp 04/02/18 1416 99.3 F (37.4 C)     Temp Source 04/02/18 1416 Oral     SpO2 04/02/18 1416 97 %     Weight 04/02/18 1418 276 lb (125.2 kg)     Height 04/02/18 1418 5\' 11"  (1.803 m)     Head Circumference --      Peak Flow --      Pain Score 04/02/18 1418 8     Pain Loc --      Pain Edu? --      Excl. in [place]? --    No data found.  Updated Vital Signs BP (!) 155/81 (BP Location: Right Arm)   Pulse 81   Temp 99.3 F (37.4 C) (Oral)   Ht 5\' 11"  (1.803 m)   Wt 276 lb (125.2 kg)   SpO2 97%   BMI 38.49 kg/m   Visual Acuity Right Eye Distance:   Left Eye Distance:   Bilateral Distance:    Right Eye Near:   Left Eye Near:    Bilateral Near:     Physical Exam  Constitutional: He appears well-developed and well-nourished. He does not appear ill. No distress.  HENT:  Head: Normocephalic.  Mouth/Throat: Oropharynx is clear and moist.  Eyes: Pupils are equal, round, and reactive to light.  Cardiovascular: Normal heart sounds.  Pulmonary/Chest: Breath sounds normal.  Abdominal: Soft. Normal appearance and bowel sounds are normal. There is no hepatosplenomegaly. There is tenderness in the left upper quadrant. There is CVA tenderness. There is no rigidity, no rebound, no guarding and no tenderness at McBurney's point. A hernia is present. Hernia confirmed positive in the ventral area.    Musculoskeletal: He exhibits no edema.  Neurological: He is alert.  Skin: Skin is warm and dry.  Nursing note and vitals reviewed.    UC Treatments / Results    Labs (all labs ordered are listed, but only abnormal results are displayed) Labs Reviewed  POCT URINALYSIS DIP (MANUAL ENTRY) - Abnormal; Notable for the following components:      Result Value   Blood, UA trace-intact (*)    All other components within normal limits  COMPREHENSIVE METABOLIC PANEL  POCT CBC W AUTO DIFF ([facility]):  WBC 8.0; LY 16.8; MO 9.4; GR 73.8; Hgb 13.9; Platelets 228     EKG None  Radiology Dg Abdomen 1 View  Result Date:  04/02/2018 CLINICAL DATA:  67 year old male with sudden onset abdominal pain and diarrhea yesterday. EXAM: ABDOMEN - 1 VIEW COMPARISON:  Pelvis radiographs 07/21/2017. FINDINGS: Normal bowel gas pattern and abdominal visceral contours. No pneumoperitoneum identified on these images which are likely supine. No acute osseous abnormality identified. Chronic pelvic phleboliths incidentally noted on the left. IMPRESSION: Negative. Electronically Signed   By: Genevie Ann M.D.   On: 04/02/2018 15:55    Procedures Procedures (including critical care time)  Medications Ordered in UC Medications - No data to display  Initial Impression / Assessment and Plan / UC Course  I have reviewed the triage vital signs and the nursing notes.  Pertinent labs & imaging results that were available during my care of the patient were reviewed by me and considered in my medical decision making (see chart for details).    Normal WBC, and negative plain film abdominal x-ray reassuring. ?mild viral gastroenteritis. Begin clear liquids and slowly advance. Recommend followup with PCP in 3 days.   Final Clinical Impressions(s) / UC Diagnoses   Final diagnoses:  Left upper quadrant pain     Discharge Instructions     Begin clear liquids for about 18 to 24 hours, then may begin a BRAT diet (Bananas, Rice, Applesauce, Toast) when abdominal pain resolved.  Then gradually advance to a regular diet as tolerated.  Avoid milk products until well.   If symptoms  become significantly worse during the night or over the weekend, proceed to the local emergency room.     ED Prescriptions    None         Kandra Nicolas, MD 04/02/18 1615

## 2018-04-02 NOTE — Discharge Instructions (Addendum)
Begin clear liquids for about 18 to 24 hours, then may begin a BRAT diet (Bananas, Rice, Applesauce, Toast) when abdominal pain resolved.  Then gradually advance to a regular diet as tolerated.  Avoid milk products until well.   If symptoms become significantly worse during the night or over the weekend, proceed to the local emergency room.

## 2018-04-03 ENCOUNTER — Telehealth: Payer: Self-pay | Admitting: Emergency Medicine

## 2018-04-03 LAB — COMPREHENSIVE METABOLIC PANEL
AG RATIO: 1.7 (calc) (ref 1.0–2.5)
ALKALINE PHOSPHATASE (APISO): 57 U/L (ref 40–115)
ALT: 12 U/L (ref 9–46)
AST: 14 U/L (ref 10–35)
Albumin: 4 g/dL (ref 3.6–5.1)
BILIRUBIN TOTAL: 0.6 mg/dL (ref 0.2–1.2)
BUN: 17 mg/dL (ref 7–25)
CALCIUM: 9 mg/dL (ref 8.6–10.3)
CHLORIDE: 103 mmol/L (ref 98–110)
CO2: 29 mmol/L (ref 20–32)
Creat: 1.14 mg/dL (ref 0.70–1.25)
GLOBULIN: 2.4 g/dL (ref 1.9–3.7)
Glucose, Bld: 94 mg/dL (ref 65–99)
POTASSIUM: 3.8 mmol/L (ref 3.5–5.3)
Sodium: 141 mmol/L (ref 135–146)
Total Protein: 6.4 g/dL (ref 6.1–8.1)

## 2018-04-03 LAB — EXTRA LAV TOP TUBE

## 2018-04-03 NOTE — Telephone Encounter (Signed)
Patient given his negative lab results; he is minimally better; knows to go to ER if worsening symptoms or PCP if no good improvement over next 2 days.

## 2018-04-05 ENCOUNTER — Encounter (HOSPITAL_BASED_OUTPATIENT_CLINIC_OR_DEPARTMENT_OTHER): Payer: Self-pay

## 2018-04-05 ENCOUNTER — Encounter: Payer: Self-pay | Admitting: Family Medicine

## 2018-04-05 ENCOUNTER — Ambulatory Visit (HOSPITAL_BASED_OUTPATIENT_CLINIC_OR_DEPARTMENT_OTHER)
Admission: RE | Admit: 2018-04-05 | Discharge: 2018-04-05 | Disposition: A | Discharge status: Home / Self Care | Payer: Medicare Other | Source: Ambulatory Visit | Attending: Family Medicine | Admitting: Family Medicine

## 2018-04-05 ENCOUNTER — Ambulatory Visit (INDEPENDENT_AMBULATORY_CARE_PROVIDER_SITE_OTHER): Payer: Medicare Other | Admitting: Family Medicine

## 2018-04-05 VITALS — BP 107/73 | HR 87 | Ht 71.0 in | Wt 296.0 lb

## 2018-04-05 DIAGNOSIS — K5730 Diverticulosis of large intestine without perforation or abscess without bleeding: Secondary | ICD-10-CM | POA: Insufficient documentation

## 2018-04-05 DIAGNOSIS — R1032 Left lower quadrant pain: Secondary | ICD-10-CM | POA: Insufficient documentation

## 2018-04-05 DIAGNOSIS — R110 Nausea: Secondary | ICD-10-CM | POA: Diagnosis not present

## 2018-04-05 DIAGNOSIS — I7090 Unspecified atherosclerosis: Secondary | ICD-10-CM | POA: Diagnosis not present

## 2018-04-05 DIAGNOSIS — K5732 Diverticulitis of large intestine without perforation or abscess without bleeding: Secondary | ICD-10-CM | POA: Diagnosis not present

## 2018-04-05 DIAGNOSIS — K5792 Diverticulitis of intestine, part unspecified, without perforation or abscess without bleeding: Secondary | ICD-10-CM | POA: Diagnosis not present

## 2018-04-05 LAB — CBC WITH DIFFERENTIAL/PLATELET
BASOS PCT: 0.5 %
Basophils Absolute: 51 cells/uL (ref 0–200)
EOS ABS: 91 {cells}/uL (ref 15–500)
Eosinophils Relative: 0.9 %
HCT: 45.2 % (ref 38.5–50.0)
Hemoglobin: 15.2 g/dL (ref 13.2–17.1)
Lymphs Abs: 1030 cells/uL (ref 850–3900)
MCH: 28.5 pg (ref 27.0–33.0)
MCHC: 33.6 g/dL (ref 32.0–36.0)
MCV: 84.8 fL (ref 80.0–100.0)
MPV: 10.7 fL (ref 7.5–12.5)
Monocytes Relative: 8.1 %
Neutro Abs: 8110 cells/uL — ABNORMAL HIGH (ref 1500–7800)
Neutrophils Relative %: 80.3 %
PLATELETS: 301 10*3/uL (ref 140–400)
RBC: 5.33 10*6/uL (ref 4.20–5.80)
RDW: 12.4 % (ref 11.0–15.0)
TOTAL LYMPHOCYTE: 10.2 %
WBC: 10.1 10*3/uL (ref 3.8–10.8)
WBCMIX: 818 {cells}/uL (ref 200–950)

## 2018-04-05 LAB — AMYLASE: Amylase: 21 U/L (ref 21–101)

## 2018-04-05 LAB — LIPASE: Lipase: 13 U/L (ref 7–60)

## 2018-04-05 MED ORDER — IOPAMIDOL (ISOVUE-300) INJECTION 61%
100.0000 mL | Freq: Once | INTRAVENOUS | Status: AC | PRN
  Administered 2018-04-05: 100 mL via INTRAVENOUS

## 2018-04-05 MED ORDER — AMOXICILLIN-POT CLAVULANATE 500-125 MG PO TABS: 1.0000 | ORAL_TABLET | Freq: Three times a day (TID) | ORAL | 0 refills | Status: DC

## 2018-04-05 NOTE — Addendum Note (Signed)
Addended by: Beatrice Lecher D on: 04/05/2018 01:40 PM   Modules accepted: Orders

## 2018-04-05 NOTE — Progress Notes (Addendum)
Subjective:    Patient ID: John Morrow, male    DOB: Aug 10, 1951, 67 y.o.   MRN: [id number]  HPI  67 year old male who is a patient of Dr. Clovis Riley is here today for left upper quadrant pain.  He actually went to urgent care on Friday on July 19 for abdominal pain and loose stools.   and was evaluated with plain abdominal film, CBC and CMP.  At the time it was felt to be possibly gastroenteritis but was told to follow-up if not improving.  Still not feeling well and complains of pain particularly on that left side of the abdomen.  Even this morning he tried to eat a banana and then felt so nauseated he almost vomited.  No fever, chills or sweats.  Except for loose stool on Friday he has not had any significant bowel changes.  No blood in the stool.  No actual vomiting.  Wife says he looks really pale this morning.  He did recently start Trulicity and was having some mid abdominal pain with nausea and eventually stopped it for about a month and then restarted it about 3 weeks ago.  But says this pain is different than what he was previously experiencing with Trulicity.  He is been trying to lose a little weight purposely as well so that he can have hip replacement.   Review of Systems   BP 107/73   Pulse 87   Ht 5\' 11"  (1.803 m)   Wt 296 lb (134.3 kg)   BMI 41.28 kg/m     Allergies  Allergen Reactions  . Morphine And Related     Rash, vomiting, itching     Past Medical History:  Diagnosis Date  . Hyperlipemia   . Hypertension     Past Surgical History:  Procedure Laterality Date  . KNEE SURGERY      Social History   Socioeconomic History  . Marital status: Married    Spouse name: Not on file  . Number of children: Not on file  . Years of education: Not on file  . Highest education level: Not on file  Occupational History  . Not on file  Social Needs  . Financial resource strain: Not on file  . Food insecurity:    Worry: Not on file    Inability: Not on file  .  Transportation needs:    Medical: Not on file    Non-medical: Not on file  Tobacco Use  . Smoking status: Never Smoker  . Smokeless tobacco: Never Used  Substance and Sexual Activity  . Alcohol use: Yes    Comment: occassionally  . Drug use: No  . Sexual activity: Yes    Partners: Female  Lifestyle  . Physical activity:    Days per week: Not on file    Minutes per session: Not on file  . Stress: Not on file  Relationships  . Social connections:    Talks on phone: Not on file    Gets together: Not on file    Attends religious service: Not on file    Active member of club or organization: Not on file    Attends meetings of clubs or organizations: Not on file    Relationship status: Not on file  . Intimate partner violence:    Fear of current or ex partner: Not on file    Emotionally abused: Not on file    Physically abused: Not on file    Forced sexual activity: Not on file  Other Topics Concern  . Not on file  Social History Narrative  . Not on file    Family History  Problem Relation Age of Onset  . Lung disease Mother   . Lung disease Father     Outpatient Encounter Medications as of 04/05/2018  Medication Sig  . amLODipine (NORVASC) 10 MG tablet TAKE 1 TABLET BY MOUTH ONCE DAILY  . diclofenac sodium (VOLTAREN) 1 % GEL Apply 4 g topically 4 (four) times daily. To affected joint.  . Dulaglutide (TRULICITY) 7.41 OI/7.8MV SOPN Inject 0.5 mLs into the skin once a week.  . furosemide (LASIX) 20 MG tablet TAKE 1 TABLET BY MOUTH ONCE DAILY  . lisinopril-hydrochlorothiazide (PRINZIDE,ZESTORETIC) 20-25 MG tablet TAKE 1 TABLET BY MOUTH ONCE DAILY  . omeprazole (PRILOSEC) 40 MG capsule Take 1 capsule (40 mg total) by mouth daily.  . simvastatin (ZOCOR) 40 MG tablet Take 1 tablet (40 mg total) by mouth daily at 6 PM.  . tamsulosin (FLOMAX) 0.4 MG CAPS capsule Take 1 capsule (0.4 mg total) by mouth daily.  . traMADol (ULTRAM) 50 MG tablet TAKE 1 TABLET BY MOUTH TWICE DAILY FOR  CHRONIC PAIN  . amoxicillin-clavulanate (AUGMENTIN) 500-125 MG tablet Take 1 tablet (500 mg total) by mouth every 8 (eight) hours.   No facility-administered encounter medications on file as of 04/05/2018.          Objective:   Physical Exam  Constitutional: He is oriented to person, place, and time. He appears well-developed and well-nourished.  HENT:  Head: Normocephalic and atraumatic.  Cardiovascular: Normal rate, regular rhythm and normal heart sounds.  Pulmonary/Chest: Effort normal and breath sounds normal.  Abdominal: Soft. Bowel sounds are normal. He exhibits no distension and no mass. There is tenderness. There is no rebound and no guarding. No hernia.  Only tender in the left upper quadrant but very tender in the left lower quadrant.  Neurological: He is alert and oriented to person, place, and time.  Skin: Skin is warm and dry.  Psychiatric: He has a normal mood and affect. His behavior is normal.        Assessment & Plan:  LUQ pain -at this point pain is been persistent and he is now getting very nauseated even just eating a banana.  Recommend further evaluation for diverticulitis versus pancreatitis.  Will order additional blood work in addition to getting a CT abdomen pelvis with contrast today.  Will call with results as soon as they are available.  Discussed treatment options of either of these diagnoses are found.  Is on Trulicity which increases his risk for pancreatitis.    CT confirmed diverticulitis-we will treat with Augmentin since it appears to be mild and he is afebrile.  Will switch to clear liquid diet.  Follow-up in 10 days with PCP.

## 2018-04-06 ENCOUNTER — Ambulatory Visit: Payer: Medicare Other | Admitting: Family Medicine

## 2018-04-16 ENCOUNTER — Ambulatory Visit: Payer: Medicare Other | Admitting: Family Medicine

## 2018-04-16 ENCOUNTER — Encounter: Payer: Self-pay | Admitting: Family Medicine

## 2018-04-16 VITALS — BP 128/75 | HR 84 | Wt 296.0 lb

## 2018-04-16 DIAGNOSIS — K5792 Diverticulitis of intestine, part unspecified, without perforation or abscess without bleeding: Secondary | ICD-10-CM

## 2018-04-16 DIAGNOSIS — M1611 Unilateral primary osteoarthritis, right hip: Secondary | ICD-10-CM

## 2018-04-16 MED ORDER — BUPROPION HCL ER (XL) 150 MG PO TB24: 150.0000 mg | ORAL_TABLET | Freq: Every day | ORAL | 1 refills | Status: DC

## 2018-04-16 NOTE — Patient Instructions (Signed)
Thank you for coming in today. Please start Wellbutrin.  If not seeing Dr [facility] recheck in 1 month.   [phone]   Bupropion extended-release tablets (Depression/Mood Disorders) What is this medicine? BUPROPION (byoo PROE pee on) is used to treat depression. This medicine may be used for other purposes; ask your health care provider or pharmacist if you have questions. COMMON BRAND NAME(S): Aplenzin, Budeprion XL, Forfivo XL, Wellbutrin XL What should I tell my health care provider before I take this medicine? They need to know if you have any of these conditions: -an eating disorder, such as anorexia or bulimia -bipolar disorder or psychosis -diabetes or high blood sugar, treated with medication -glaucoma -head injury or brain tumor -heart disease, previous heart attack, or irregular heart beat -high blood pressure -kidney or liver disease -seizures (convulsions) -suicidal thoughts or a previous suicide attempt -Tourette's syndrome -weight loss -an unusual or allergic reaction to bupropion, other medicines, foods, dyes, or preservatives -breast-feeding -pregnant or trying to become pregnant How should I use this medicine? Take this medicine by mouth with a glass of water. Follow the directions on the prescription label. You can take it with or without food. If it upsets your stomach, take it with food. Do not crush, chew, or cut these tablets. This medicine is taken once daily at the same time each day. Do not take your medicine more often than directed. Do not stop taking this medicine suddenly except upon the advice of your doctor. Stopping this medicine too quickly may cause serious side effects or your condition may worsen. A special MedGuide will be given to you by the pharmacist with each prescription and refill. Be sure to read this information carefully each time. Talk to your pediatrician regarding the use of this medicine in children. Special care may be  needed. Overdosage: If you think you have taken too much of this medicine contact a poison control center or emergency room at once. NOTE: This medicine is only for you. Do not share this medicine with others. What if I miss a dose? If you miss a dose, skip the missed dose and take your next tablet at the regular time. Do not take double or extra doses. What may interact with this medicine? Do not take this medicine with any of the following medications: -linezolid -MAOIs like Azilect, Carbex, Eldepryl, Marplan, Nardil, and Parnate -methylene blue (injected into a vein) -other medicines that contain bupropion like Zyban This medicine may also interact with the following medications: -alcohol -certain medicines for anxiety or sleep -certain medicines for blood pressure like metoprolol, propranolol -certain medicines for depression or psychotic disturbances -certain medicines for HIV or AIDS like efavirenz, lopinavir, nelfinavir, ritonavir -certain medicines for irregular heart beat like propafenone, flecainide -certain medicines for Parkinson's disease like amantadine, levodopa -certain medicines for seizures like carbamazepine, phenytoin, phenobarbital -cimetidine -clopidogrel -cyclophosphamide -digoxin -furazolidone -isoniazid -nicotine -orphenadrine -procarbazine -steroid medicines like prednisone or cortisone -stimulant medicines for attention disorders, weight loss, or to stay awake -tamoxifen -theophylline -thiotepa -ticlopidine -tramadol -warfarin This list may not describe all possible interactions. Give your health care provider a list of all the medicines, herbs, non-prescription drugs, or dietary supplements you use. Also tell them if you smoke, drink alcohol, or use illegal drugs. Some items may interact with your medicine. What should I watch for while using this medicine? Tell your doctor if your symptoms do not get better or if they get worse. Visit your doctor or  health care professional for regular checks  on your progress. Because it may take several weeks to see the full effects of this medicine, it is important to continue your treatment as prescribed by your doctor. Patients and their families should watch out for new or worsening thoughts of suicide or depression. Also watch out for sudden changes in feelings such as feeling anxious, agitated, panicky, irritable, hostile, aggressive, impulsive, severely restless, overly excited and hyperactive, or not being able to sleep. If this happens, especially at the beginning of treatment or after a change in dose, call your health care professional. Avoid alcoholic drinks while taking this medicine. Drinking large amounts of alcoholic beverages, using sleeping or anxiety medicines, or quickly stopping the use of these agents while taking this medicine may increase your risk for a seizure. Do not drive or use heavy machinery until you know how this medicine affects you. This medicine can impair your ability to perform these tasks. Do not take this medicine close to bedtime. It may prevent you from sleeping. Your mouth may get dry. Chewing sugarless gum or sucking hard candy, and drinking plenty of water may help. Contact your doctor if the problem does not go away or is severe. The tablet shell for some brands of this medicine does not dissolve. This is normal. The tablet shell may appear whole in the stool. This is not a cause for concern. What side effects may I notice from receiving this medicine? Side effects that you should report to your doctor or health care professional as soon as possible: -allergic reactions like skin rash, itching or hives, swelling of the face, lips, or tongue -breathing problems -changes in vision -confusion -elevated mood, decreased need for sleep, racing thoughts, impulsive behavior -fast or irregular heartbeat -hallucinations, loss of contact with reality -increased blood  pressure -redness, blistering, peeling or loosening of the skin, including inside the mouth -seizures -suicidal thoughts or other mood changes -unusually weak or tired -vomiting Side effects that usually do not require medical attention (report to your doctor or health care professional if they continue or are bothersome): -constipation -headache -loss of appetite -nausea -tremors -weight loss This list may not describe all possible side effects. Call your doctor for medical advice about side effects. You may report side effects to FDA at 1-800-FDA-1088. Where should I keep my medicine? Keep out of the reach of children. Store at room temperature between 15 and 30 degrees C (59 and 86 degrees F). Throw away any unused medicine after the expiration date. NOTE: This sheet is a summary. It may not cover all possible information. If you have questions about this medicine, talk to your doctor, pharmacist, or health care provider.  2018 Elsevier/Gold Standard (2016-02-22 13:55:13)

## 2018-04-16 NOTE — Progress Notes (Signed)
John Morrow is a 67 y.o. male who presents to [facility]: [place] today for follow up obesity, hip pain and diverticulitis.   [name] has been struggling with obesity for quite a while now.  He is attempting to lose weight so that he can have better health but also have a right hip replacement.  His goal is to get his BMI under 40.  He has had some success but has been plateauing recently at around 296.  He said trials of medications most recently including Trulicity.  He had trouble tolerating Trulicity causing stomach upset.  He last used Trulicity about 3 weeks ago.  He is trying to consume fewer calories but having trouble despite using a food log and increasing his protein to carbohydrate ratio.  Additionally patient had an episode of diverticulitis last week.  He was seen by my partner Dr. Suzi Roots on July 22.  The diagnosis of diverticulitis was confirmed via CT scan showing sigmoid diverticulitis.  He had a course of Augmentin and is feeling a lot better.  No further abdominal pain diarrhea or vomiting.  Hip pain: [name] continues to experience chronic right hip pain.  He is planning on a hip replacement surgery when BMI less than 40.  He continues his medications listed below for pain which helped.   ROS as above:  Exam:  BP 128/75   Pulse 84   Wt 296 lb (134.3 kg)   BMI 41.28 kg/m   Wt Readings from Last 5 Encounters:  04/16/18 296 lb (134.3 kg)  04/05/18 296 lb (134.3 kg)  04/02/18 276 lb (125.2 kg)  03/17/18 (!) 302 lb (137 kg)  02/10/18 299 lb (135.6 kg)    Gen: Well NAD HEENT: EOMI,  MMM Lungs: Normal work of breathing. CTABL Heart: RRR no MRG Abd: NABS, Soft. Nondistended, Nontender Exts: Brisk capillary refill, warm and well perfused.   Lab and Radiology Results No results found for this or any previous visit (from the past 72 hour(s)). No results  found.    Assessment and Plan: 67 y.o. male with  Obesity: Plateauing despite good lifestyle modification.  Struggling to find a medication regime that will help.  He cannot afford the conventional FDA approved medications for weight loss as they are branded and not approved with his health insurance.  He has had trials of Trulicity which caused stomach upset.  After discussion plan for trial of bupropion as this may help.  Additionally recommend patient contact [facility] as the multidisciplinary clinic will likely produce better results than my efforts alone.  Recheck with me in 1 month if not following up with multidisciplinary clinic.  Diverticulitis: Improved with antibiotics.  Doing quite well.  Watchful waiting at this point.  Hip pain: Continuing to be chronic.  Continue current regimen.  Anticipate hip replacement in the future.   No orders of the defined types were placed in this encounter.  Meds ordered this encounter  Medications  . buPROPion (WELLBUTRIN XL) 150 MG 24 hr tablet    Sig: Take 1 tablet (150 mg total) by mouth daily.    Dispense:  30 tablet    Refill:  1     Historical information moved to improve visibility of documentation.  Past Medical History:  Diagnosis Date  . Diverticulosis of colon without hemorrhage 06/12/2016   On Colonoscopy 11/06/2010  . HLD (hyperlipidemia) 05/29/2016  . Hyperlipemia   . Hypertension   .  Morbid obesity ([place]) 05/29/2016   Past Surgical History:  Procedure Laterality Date  . KNEE SURGERY     Social History   Tobacco Use  . Smoking status: Never Smoker  . Smokeless tobacco: Never Used  Substance Use Topics  . Alcohol use: Yes    Comment: occassionally   family history includes Lung disease in his father and mother.  Medications: Current Outpatient Medications  Medication Sig Dispense Refill  . amLODipine (NORVASC) 10 MG tablet TAKE 1 TABLET BY MOUTH ONCE DAILY 90 tablet 1  .  diclofenac sodium (VOLTAREN) 1 % GEL Apply 4 g topically 4 (four) times daily. To affected joint. 100 g 11  . furosemide (LASIX) 20 MG tablet TAKE 1 TABLET BY MOUTH ONCE DAILY 90 tablet 1  . lisinopril-hydrochlorothiazide (PRINZIDE,ZESTORETIC) 20-25 MG tablet TAKE 1 TABLET BY MOUTH ONCE DAILY 90 tablet 3  . omeprazole (PRILOSEC) 40 MG capsule Take 1 capsule (40 mg total) by mouth daily. 90 capsule 3  . simvastatin (ZOCOR) 40 MG tablet Take 1 tablet (40 mg total) by mouth daily at 6 PM. 90 tablet 3  . tamsulosin (FLOMAX) 0.4 MG CAPS capsule Take 1 capsule (0.4 mg total) by mouth daily. 90 capsule 3  . traMADol (ULTRAM) 50 MG tablet TAKE 1 TABLET BY MOUTH TWICE DAILY FOR CHRONIC PAIN 60 tablet 3  . buPROPion (WELLBUTRIN XL) 150 MG 24 hr tablet Take 1 tablet (150 mg total) by mouth daily. 30 tablet 1   No current facility-administered medications for this visit.    Allergies  Allergen Reactions  . Morphine And Related     Rash, vomiting, itching      Discussed warning signs or symptoms. Please see discharge instructions. Patient expresses understanding.

## 2018-05-19 ENCOUNTER — Ambulatory Visit: Payer: Medicare Other | Admitting: Family Medicine

## 2018-05-27 ENCOUNTER — Encounter: Payer: Self-pay | Admitting: Family Medicine

## 2018-05-27 ENCOUNTER — Ambulatory Visit: Payer: Medicare Other | Admitting: Family Medicine

## 2018-05-27 VITALS — BP 127/83 | HR 85 | Ht 71.0 in | Wt 291.0 lb

## 2018-05-27 DIAGNOSIS — M1611 Unilateral primary osteoarthritis, right hip: Secondary | ICD-10-CM | POA: Diagnosis not present

## 2018-05-27 DIAGNOSIS — Z23 Encounter for immunization: Secondary | ICD-10-CM | POA: Diagnosis not present

## 2018-05-27 MED ORDER — TRAMADOL HCL 50 MG PO TABS: ORAL_TABLET | ORAL | 0 refills | Status: DC

## 2018-05-27 MED ORDER — BUPROPION HCL ER (XL) 150 MG PO TB24: 150.0000 mg | ORAL_TABLET | Freq: Every day | ORAL | 1 refills | Status: DC

## 2018-05-27 NOTE — Progress Notes (Signed)
John Morrow is a 67 y.o. male who presents to [facility]: [place] today for follow up hip pain and obesity.   His last visit on  04/16/18 he was plateauing with weight loss after trying several different medications that did not help much or he did not tolerate. He was started on Wellbutrin which in the last 5 weeks or so has been very helpful.  He notes that with the Wellbutrin and a read indication to strict calorie control he is lost about 5 pounds.  He is quite happy with how things are going and is very near his BMI goal for hip replacement.  Notes his hip pain has been quite bothersome recently.  He has seen orthopedics and been told that his BMI needs to be 40 or less to proceed with surgery.  He has daily pain.  He continues to try to exercise doing water aerobics which does help a bit.  Additionally he controls his pain with tramadol which also helps.  He feels well otherwise.  No fevers or chills.   Patient notes that he denies chest pain or significant shortness of breath.  He is agreeable to exert himself fully.  He notes he is able to climb 2 flights of stairs without having to stop because of shortness of breath or chest pain. ROS as above:  Exam:  BP 127/83   Pulse 85   Ht 5\' 11"  (1.803 m)   Wt 291 lb (132 kg)   BMI 40.59 kg/m  Wt Readings from Last 5 Encounters:  05/27/18 291 lb (132 kg)  04/16/18 296 lb (134.3 kg)  04/05/18 296 lb (134.3 kg)  04/02/18 276 lb (125.2 kg)  03/17/18 (!) 302 lb (137 kg)    Gen: Well NAD HEENT: EOMI,  MMM Lungs: Normal work of breathing. CTABL Heart: RRR no MRG Abd: NABS, Soft. Nondistended, Nontender Exts: Brisk capillary refill, warm and well perfused.  Right hip decreased motion antalgic gait.    Assessment and Plan: 67 y.o. male with  Right hip pain: Quite bothersome.  Patient after discussion is very close to BMI  goal and will be under 40 shortly.  Plan to follow back up with orthopedic surgery to plan for total hip replacement.  I believe [name] is reasonably safe for surgery and should be a good candidate.  Plan to proceed with continued tramadol for pain control.  Hopefully this will be a long-term medication as he would very much like to get off of it with less hip pain after his total hip replacement.  Obesity: Significant improvement with Wellbutrin.  Plan to continue Wellbutrin and careful calorie counting.  Recheck in 1 month.  Patient researched [facility] Controlled Substance Reporting System.  Flu vaccine given today prior to discharge.   Orders Placed This Encounter  Procedures  . Flu Vaccine QUAD 36+ mos IM   No orders of the defined types were placed in this encounter.    Historical information moved to improve visibility of documentation.  Past Medical History:  Diagnosis Date  . Diverticulosis of colon without hemorrhage 06/12/2016   On Colonoscopy 11/06/2010  . HLD (hyperlipidemia) 05/29/2016  . Hyperlipemia   . Hypertension   . Morbid obesity ([place]) 05/29/2016   Past Surgical History:  Procedure Laterality Date  . KNEE SURGERY     Social History   Tobacco Use  . Smoking status: Never Smoker  . Smokeless tobacco: Never Used  Substance Use  Topics  . Alcohol use: Yes    Comment: occassionally   family history includes Lung disease in his father and mother.  Medications: Current Outpatient Medications  Medication Sig Dispense Refill  . amLODipine (NORVASC) 10 MG tablet TAKE 1 TABLET BY MOUTH ONCE DAILY 90 tablet 1  . buPROPion (WELLBUTRIN XL) 150 MG 24 hr tablet Take 1 tablet (150 mg total) by mouth daily. 30 tablet 1  . diclofenac sodium (VOLTAREN) 1 % GEL Apply 4 g topically 4 (four) times daily. To affected joint. 100 g 11  . furosemide (LASIX) 20 MG tablet TAKE 1 TABLET BY MOUTH ONCE DAILY 90 tablet 1  . lisinopril-hydrochlorothiazide (PRINZIDE,ZESTORETIC) 20-25  MG tablet TAKE 1 TABLET BY MOUTH ONCE DAILY 90 tablet 3  . omeprazole (PRILOSEC) 40 MG capsule Take 1 capsule (40 mg total) by mouth daily. 90 capsule 3  . simvastatin (ZOCOR) 40 MG tablet Take 1 tablet (40 mg total) by mouth daily at 6 PM. 90 tablet 3  . tamsulosin (FLOMAX) 0.4 MG CAPS capsule Take 1 capsule (0.4 mg total) by mouth daily. 90 capsule 3  . traMADol (ULTRAM) 50 MG tablet TAKE 1 TABLET BY MOUTH TWICE DAILY FOR CHRONIC PAIN 60 tablet 3   No current facility-administered medications for this visit.    Allergies  Allergen Reactions  . Morphine And Related     Rash, vomiting, itching      Discussed warning signs or symptoms. Please see discharge instructions. Patient expresses understanding.

## 2018-05-27 NOTE — Patient Instructions (Signed)
Thank you for coming in today.  Continue careful diet.  Continue Wellbutrin.  Use tramadol sparingly.  Follow up with Dr Mayer Camel regarding total hip.   Return in 1 month or sooner if needed.

## 2018-06-15 ENCOUNTER — Ambulatory Visit: Payer: Medicare Other | Admitting: Family Medicine

## 2018-06-15 ENCOUNTER — Encounter: Payer: Self-pay | Admitting: Family Medicine

## 2018-06-15 VITALS — BP 137/85 | HR 90 | Temp 98.9°F | Ht 71.0 in | Wt 289.0 lb

## 2018-06-15 DIAGNOSIS — K5792 Diverticulitis of intestine, part unspecified, without perforation or abscess without bleeding: Secondary | ICD-10-CM | POA: Diagnosis not present

## 2018-06-15 MED ORDER — AMOXICILLIN-POT CLAVULANATE 875-125 MG PO TABS: 1.0000 | ORAL_TABLET | Freq: Two times a day (BID) | ORAL | 0 refills | Status: AC

## 2018-06-15 MED ORDER — POLYETHYLENE GLYCOL 3350 17 GM/SCOOP PO POWD: 17.0000 g | Freq: Every day | ORAL | 1 refills | Status: DC

## 2018-06-15 MED ORDER — BUPROPION HCL ER (XL) 300 MG PO TB24: 300.0000 mg | ORAL_TABLET | Freq: Every day | ORAL | 0 refills | Status: DC

## 2018-06-15 NOTE — Progress Notes (Signed)
John Morrow is a 67 y.o. male who presents to [facility] today for diverticulitis.  John Morrow has a several day history of worsening LLQ abdominal pain consistant with prior episode of diverticulitis.  He is tried any medications yet.  No fevers chills nausea vomiting.  He notes symptoms are consistent with the early episode of diverticulitis and he like to treat it before gets worse.  Additionally he is continuing to attempt to lose weight for his total hip replacement.  He has been given a goal of 275 pounds not less than 40 BMI.  He currently is taking 150 mg of Wellbutrin which has helped a bit to help with his weight.   ROS as above:  Exam:  BP 137/85   Pulse 90   Temp 98.9 F (37.2 C) (Oral)   Ht 5\' 11"  (1.803 m)   Wt 289 lb (131.1 kg)   BMI 40.31 kg/m  Wt Readings from Last 5 Encounters:  06/15/18 289 lb (131.1 kg)  05/27/18 291 lb (132 kg)  04/16/18 296 lb (134.3 kg)  04/05/18 296 lb (134.3 kg)  04/02/18 276 lb (125.2 kg)    Gen: Well NAD HEENT: EOMI,  MMM Lungs: Normal work of breathing. CTABL Heart: RRR no MRG Abd: NABS, Soft. Nondistended, mildly tender to palpation left lower quadrant without rebound or guarding Exts: Brisk capillary refill, warm and well perfused.   Lab and Radiology Results No results found for this or any previous visit (from the past 72 hour(s)). No results found.    Assessment and Plan: 67 y.o. male with diverticulitis.  Plan for treatment with Augmentin is that helped well in the past.  Additionally switch to clear liquid diet for a few days.  Add MiraLAX and feeling better.  Recheck as scheduled in mid October.  Additionally for obesity will increase Wellbutrin to 300 mg and continue to work on a low calorie diet approximately 1700 cal/day.  Recheck as scheduled.   No orders of the defined types were placed in this  encounter.  Meds ordered this encounter  Medications  . buPROPion (WELLBUTRIN XL) 300 MG 24 hr tablet    Sig: Take 1 tablet (300 mg total) by mouth daily.    Dispense:  90 tablet    Refill:  0  . polyethylene glycol powder (GLYCOLAX/MIRALAX) powder    Sig: Take 17 g by mouth daily.    Dispense:  850 g    Refill:  1  . amoxicillin-clavulanate (AUGMENTIN) 875-125 MG tablet    Sig: Take 1 tablet by mouth 2 (two) times daily for 10 days.    Dispense:  20 tablet    Refill:  0     Historical information moved to improve visibility of documentation.  Past Medical History:  Diagnosis Date  . Diverticulosis of colon without hemorrhage 06/12/2016   On Colonoscopy 11/06/2010  . HLD (hyperlipidemia) 05/29/2016  . Hyperlipemia   . Hypertension   . Morbid obesity ([place]) 05/29/2016   Past Surgical History:  Procedure Laterality Date  . KNEE SURGERY     Social History   Tobacco Use  . Smoking status: Never Smoker  . Smokeless tobacco: Never Used  Substance Use Topics  . Alcohol use: Yes    Comment: occassionally   family history includes Lung disease in his father and mother.  Medications: Current Outpatient Medications  Medication Sig Dispense Refill  . amLODipine (NORVASC) 10 MG tablet TAKE 1 TABLET  BY MOUTH ONCE DAILY 90 tablet 1  . buPROPion (WELLBUTRIN XL) 300 MG 24 hr tablet Take 1 tablet (300 mg total) by mouth daily. 90 tablet 0  . diclofenac sodium (VOLTAREN) 1 % GEL Apply 4 g topically 4 (four) times daily. To affected joint. 100 g 11  . furosemide (LASIX) 20 MG tablet TAKE 1 TABLET BY MOUTH ONCE DAILY 90 tablet 1  . lisinopril-hydrochlorothiazide (PRINZIDE,ZESTORETIC) 20-25 MG tablet TAKE 1 TABLET BY MOUTH ONCE DAILY 90 tablet 3  . omeprazole (PRILOSEC) 40 MG capsule Take 1 capsule (40 mg total) by mouth daily. 90 capsule 3  . simvastatin (ZOCOR) 40 MG tablet Take 1 tablet (40 mg total) by mouth daily at 6 PM. 90 tablet 3  . tamsulosin (FLOMAX) 0.4 MG CAPS capsule Take 1  capsule (0.4 mg total) by mouth daily. 90 capsule 3  . traMADol (ULTRAM) 50 MG tablet TAKE 1 TABLET BY MOUTH TWICE DAILY FOR CHRONIC PAIN 60 tablet 0  . amoxicillin-clavulanate (AUGMENTIN) 875-125 MG tablet Take 1 tablet by mouth 2 (two) times daily for 10 days. 20 tablet 0  . polyethylene glycol powder (GLYCOLAX/MIRALAX) powder Take 17 g by mouth daily. 850 g 1   No current facility-administered medications for this visit.    Allergies  Allergen Reactions  . Morphine And Related     Rash, vomiting, itching      Discussed warning signs or symptoms. Please see discharge instructions. Patient expresses understanding.

## 2018-06-15 NOTE — Patient Instructions (Signed)
Thank you for coming in today. I do think this is early diverticulitis.  START Augmentin antibiotic.  Clear liquid diet for 2-3 days then advance diet slowly.  When feeling better increase wellbutirn to 300mg  daily.  Add Miralax when diet is more normal.  Recheck if not improving sooner.   Recheck as scheduled on October 14th.    Diverticulitis Diverticulitis is infection or inflammation of small pouches (diverticula) in the colon that form due to a condition called diverticulosis. Diverticula can trap stool (feces) and bacteria, causing infection and inflammation. Diverticulitis may cause severe stomach pain and diarrhea. It may lead to tissue damage in the colon that causes bleeding. The diverticula may also burst (rupture) and cause infected stool to enter other areas of the abdomen. Complications of diverticulitis can include:  Bleeding.  Severe infection.  Severe pain.  Rupture (perforation) of the colon.  Blockage (obstruction) of the colon.  What are the causes? This condition is caused by stool becoming trapped in the diverticula, which allows bacteria to grow in the diverticula. This leads to inflammation and infection. What increases the risk? You are more likely to develop this condition if:  You have diverticulosis. The risk for diverticulosis increases if: ? You are overweight or obese. ? You use tobacco products. ? You do not get enough exercise.  You eat a diet that does not include enough fiber. High-fiber foods include fruits, vegetables, beans, nuts, and whole grains.  What are the signs or symptoms? Symptoms of this condition may include:  Pain and tenderness in the abdomen. The pain is normally located on the left side of the abdomen, but it may occur in other areas.  Fever and chills.  Bloating.  Cramping.  Nausea.  Vomiting.  Changes in bowel routines.  Blood in your stool.  How is this diagnosed? This condition is diagnosed based  on:  Your medical history.  A physical exam.  Tests to make sure there is nothing else causing your condition. These tests may include: ? Blood tests. ? Urine tests. ? Imaging tests of the abdomen, including X-rays, ultrasounds, MRIs, or CT scans.  How is this treated? Most cases of this condition are mild and can be treated at home. Treatment may include:  Taking over-the-counter pain medicines.  Following a clear liquid diet.  Taking antibiotic medicines by mouth.  Rest.  More severe cases may need to be treated at a hospital. Treatment may include:  Not eating or drinking.  Taking prescription pain medicine.  Receiving antibiotic medicines through an IV tube.  Receiving fluids and nutrition through an IV tube.  Surgery.  When your condition is under control, your health care provider may recommend that you have a colonoscopy. This is an exam to look at the entire large intestine. During the exam, a lubricated, bendable tube is inserted into the anus and then passed into the rectum, colon, and other parts of the large intestine. A colonoscopy can show how severe your diverticula are and whether something else may be causing your symptoms. Follow these instructions at home: Medicines  Take over-the-counter and prescription medicines only as told by your health care provider. These include fiber supplements, probiotics, and stool softeners.  If you were prescribed an antibiotic medicine, take it as told by your health care provider. Do not stop taking the antibiotic even if you start to feel better.  Do not drive or use heavy machinery while taking prescription pain medicine. General instructions  Follow a full liquid diet  or another diet as directed by your health care provider. After your symptoms improve, your health care provider may tell you to change your diet. He or she may recommend that you eat a diet that contains at least 25 g (25 grams) of fiber daily. Fiber  makes it easier to pass stool. Healthy sources of fiber include: ? Berries. One cup contains 4-8 grams of fiber. ? Beans or lentils. One half cup contains 5-8 grams of fiber. ? Green vegetables. One cup contains 4 grams of fiber.  Exercise for at least 30 minutes, 3 times each week. You should exercise hard enough to raise your heart rate and break a sweat.  Keep all follow-up visits as told by your health care provider. This is important. You may need a colonoscopy. Contact a health care provider if:  Your pain does not improve.  You have a hard time drinking or eating food.  Your bowel movements do not return to normal. Get help right away if:  Your pain gets worse.  Your symptoms do not get better with treatment.  Your symptoms suddenly get worse.  You have a fever.  You vomit more than one time.  You have stools that are bloody, black, or tarry. Summary  Diverticulitis is infection or inflammation of small pouches (diverticula) in the colon that form due to a condition called diverticulosis. Diverticula can trap stool (feces) and bacteria, causing infection and inflammation.  You are at higher risk for this condition if you have diverticulosis and you eat a diet that does not include enough fiber.  Most cases of this condition are mild and can be treated at home. More severe cases may need to be treated at a hospital.  When your condition is under control, your health care provider may recommend that you have an exam called a colonoscopy. This exam can show how severe your diverticula are and whether something else may be causing your symptoms. This information is not intended to replace advice given to you by your health care provider. Make sure you discuss any questions you have with your health care provider. Document Released: 06/11/2005 Document Revised: 10/04/2016 Document Reviewed: 10/04/2016 Elsevier Interactive Patient Education  [employer].

## 2018-06-20 ENCOUNTER — Other Ambulatory Visit: Payer: Self-pay | Admitting: Family Medicine

## 2018-06-20 DIAGNOSIS — I10 Essential (primary) hypertension: Secondary | ICD-10-CM

## 2018-06-25 ENCOUNTER — Other Ambulatory Visit: Payer: Self-pay | Admitting: Family Medicine

## 2018-06-28 ENCOUNTER — Ambulatory Visit: Payer: Medicare Other | Admitting: Family Medicine

## 2018-06-28 ENCOUNTER — Encounter: Payer: Self-pay | Admitting: Family Medicine

## 2018-06-28 VITALS — BP 134/84 | HR 80 | Ht 71.0 in | Wt 290.0 lb

## 2018-06-28 DIAGNOSIS — K5730 Diverticulosis of large intestine without perforation or abscess without bleeding: Secondary | ICD-10-CM

## 2018-06-28 DIAGNOSIS — M25559 Pain in unspecified hip: Secondary | ICD-10-CM | POA: Insufficient documentation

## 2018-06-28 NOTE — Patient Instructions (Addendum)
Thank you for coming in today. I do removed trying to get in with Dr Migdalia D[facility] clinic [phone]  Let me know if you have any issues.  I will continue to prescribe tramadol We will continue to work on weight.  If you get in with Dr Leafy Ro no need to follow up with me in 1 month.  Next check back with me is in 3 months.   Bring you food log to the first visit with Dr Leafy Ro.

## 2018-06-28 NOTE — Progress Notes (Signed)
John Morrow is a 67 y.o. male who presents to [place]: [place] today for follow-up obesity and hip pain.  John Morrow continues to have quite bothersome hip pain due to DJD anticipating a total hip replacement.  He has a goal to get his weight under a BMI of 40 however his orthopedic surgeon recently said it should be more like 275 prior to surgery.  He has had some limited success with weight loss with careful diet as well as bupropion.  He was intolerant of GLP-1 medication.  He continues to eat a lower carbohydrate and lower calorie diet but has trouble continuing to lose weight.  Additionally in the interval he had an episode of diverticulitis.  He was treated with Augmentin and is feeling a lot better now.  He continues to have some constipation and uses MiraLAX daily and has a bowel movement every other day or so.   ROS as above:  Exam:  BP 134/84   Pulse 80   Ht 5\' 11"  (1.803 m)   Wt 290 lb (131.5 kg)   BMI 40.45 kg/m  Wt Readings from Last 5 Encounters:  06/28/18 290 lb (131.5 kg)  06/15/18 289 lb (131.1 kg)  05/27/18 291 lb (132 kg)  04/16/18 296 lb (134.3 kg)  04/05/18 296 lb (134.3 kg)    Gen: Well NAD HEENT: EOMI,  MMM Lungs: Normal work of breathing. CTABL Heart: RRR no MRG Abd: NABS, Soft. Nondistended, Nontender Exts: Brisk capillary refill, warm and well perfused.   Lab and Radiology Results No results found for this or any previous visit (from the past 72 hour(s)). No results found.    Assessment and Plan: 67 y.o. male with  Morbid obesity: Plateauing with weight loss.  I think at this point really is worse this time and effort to follow-up with medical supervised multidisciplinary weight loss clinic.  Given number to Dr. Migdalia Dk clinic and asked to make a phone call to schedule first appointment.  Diverticulitis resolved.  Watchful  waiting.  Chronic constipation: Recommend increasing MiraLAX to 1 capful twice daily to have one soft bowel movement daily.   No orders of the defined types were placed in this encounter.  No orders of the defined types were placed in this encounter.    Historical information moved to improve visibility of documentation.  Past Medical History:  Diagnosis Date  . Diverticulosis of colon without hemorrhage 06/12/2016   On Colonoscopy 11/06/2010  . HLD (hyperlipidemia) 05/29/2016  . Hyperlipemia   . Hypertension   . Morbid obesity ([place]) 05/29/2016   Past Surgical History:  Procedure Laterality Date  . KNEE SURGERY     Social History   Tobacco Use  . Smoking status: Never Smoker  . Smokeless tobacco: Never Used  Substance Use Topics  . Alcohol use: Yes    Comment: occassionally   family history includes Lung disease in his father and mother.  Medications: Current Outpatient Medications  Medication Sig Dispense Refill  . amLODipine (NORVASC) 10 MG tablet TAKE 1 TABLET BY MOUTH ONCE DAILY 90 tablet 1  . buPROPion (WELLBUTRIN XL) 300 MG 24 hr tablet Take 1 tablet (300 mg total) by mouth daily. 90 tablet 0  . diclofenac sodium (VOLTAREN) 1 % GEL Apply 4 g topically 4 (four) times daily. To affected joint. 100 g 11  . furosemide (LASIX) 20 MG tablet TAKE 1 TABLET BY MOUTH ONCE DAILY 90 tablet 1  . lisinopril-hydrochlorothiazide (PRINZIDE,ZESTORETIC)  20-25 MG tablet TAKE 1 TABLET BY MOUTH ONCE DAILY 90 tablet 3  . omeprazole (PRILOSEC) 40 MG capsule Take 1 capsule (40 mg total) by mouth daily. 90 capsule 3  . polyethylene glycol powder (GLYCOLAX/MIRALAX) powder Take 17 g by mouth daily. 850 g 1  . simvastatin (ZOCOR) 40 MG tablet Take 1 tablet (40 mg total) by mouth daily at 6 PM. 90 tablet 3  . tamsulosin (FLOMAX) 0.4 MG CAPS capsule Take 1 capsule (0.4 mg total) by mouth daily. 90 capsule 3  . traMADol (ULTRAM) 50 MG tablet TAKE 1 TABLET BY MOUTH TWICE DAILY FOR  CHRONIC  PAIN 60  tablet 0   No current facility-administered medications for this visit.    Allergies  Allergen Reactions  . Morphine And Related     Rash, vomiting, itching      Discussed warning signs or symptoms. Please see discharge instructions. Patient expresses understanding.

## 2018-07-11 ENCOUNTER — Other Ambulatory Visit: Payer: Self-pay | Admitting: Family Medicine

## 2018-07-11 DIAGNOSIS — R609 Edema, unspecified: Secondary | ICD-10-CM

## 2018-07-18 ENCOUNTER — Other Ambulatory Visit: Payer: Self-pay | Admitting: Family Medicine

## 2018-07-26 ENCOUNTER — Other Ambulatory Visit: Payer: Self-pay | Admitting: Family Medicine

## 2018-08-21 IMAGING — DX DG FOOT COMPLETE 3+V*R*
3 series · 3 of 3 positions shown · non-contrast
Comparison: None.

CLINICAL DATA: 66-year-old presenting with a 3 day history of right
foot pain localized to the fourth and fifth metatarsals. No known
injuries.

EXAM:
RIGHT FOOT COMPLETE - 3+ VIEW

[foot ap]
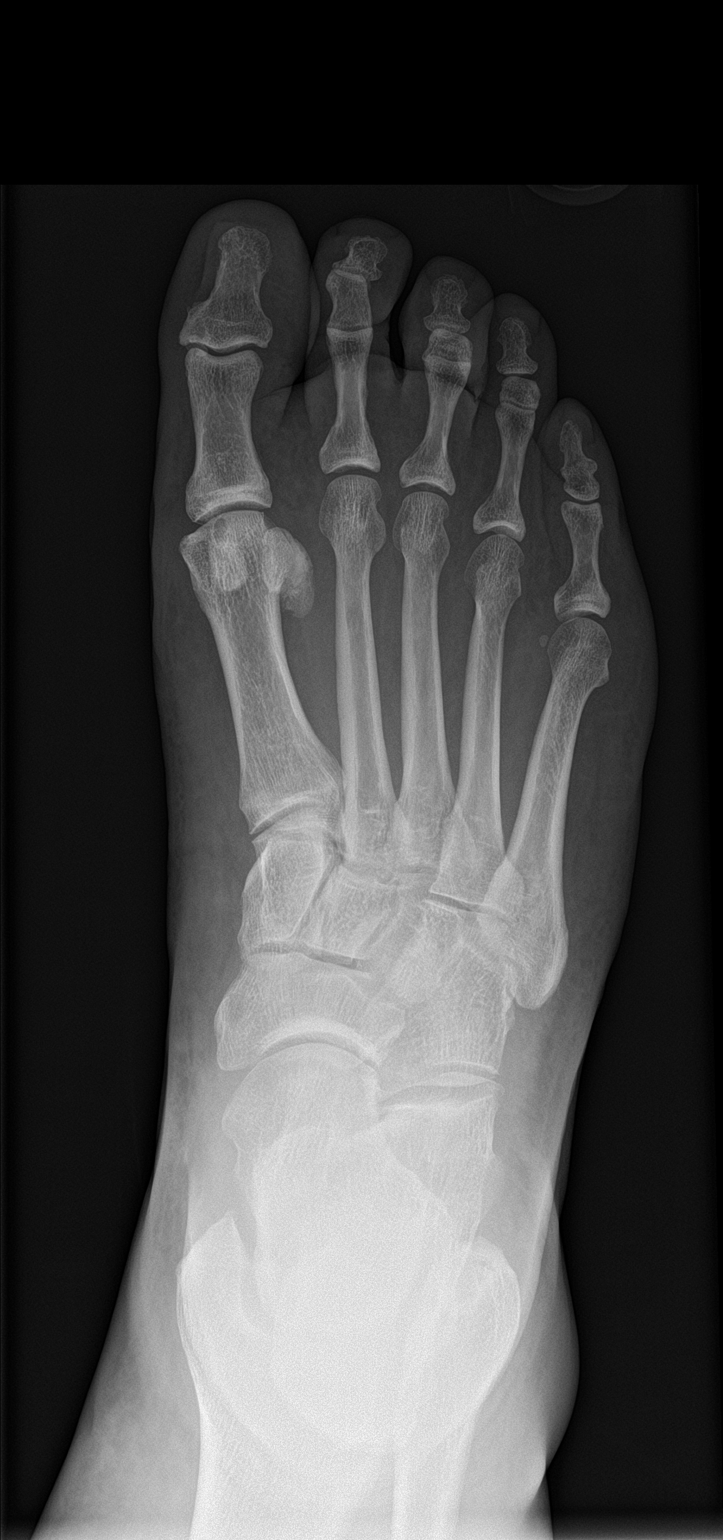

[foot obl]
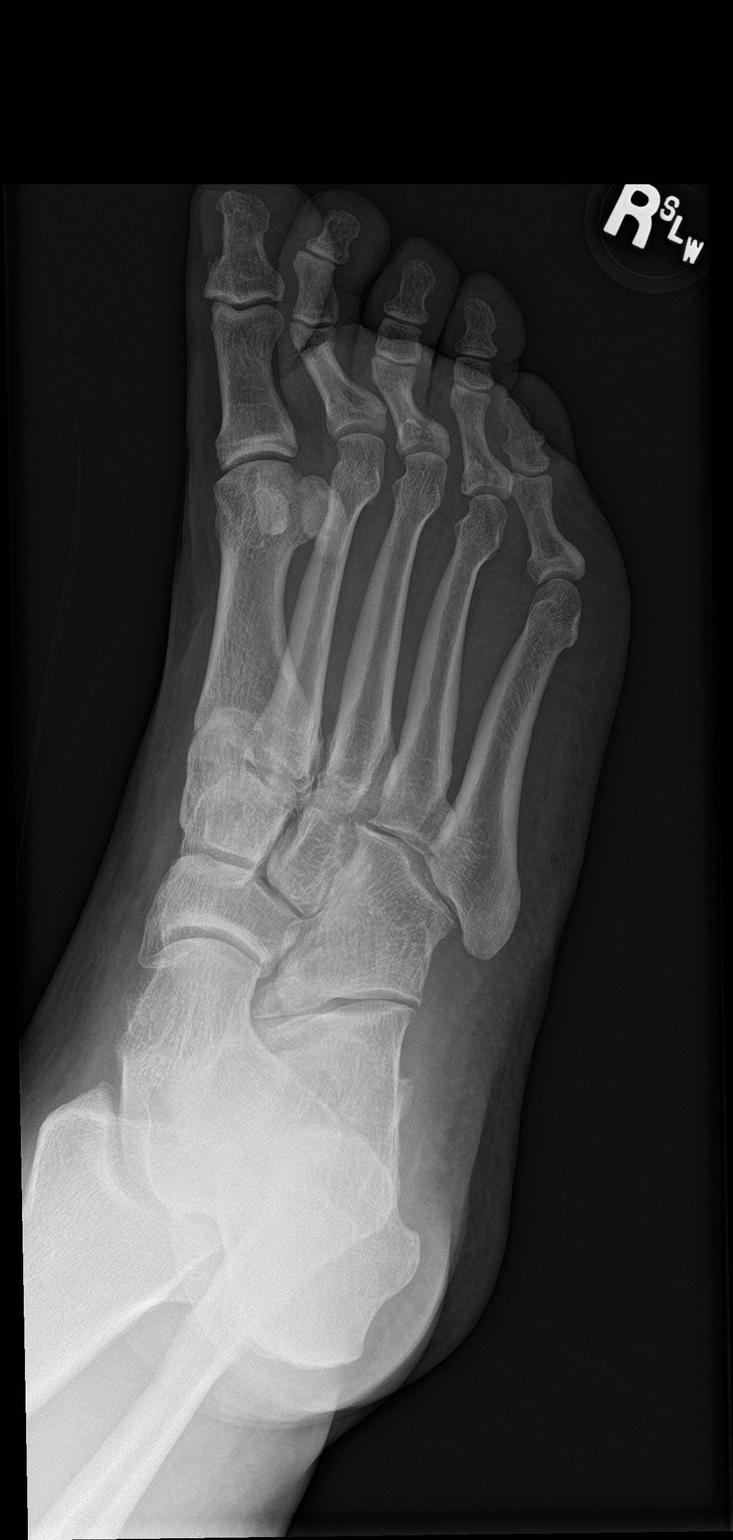

[foot lat]
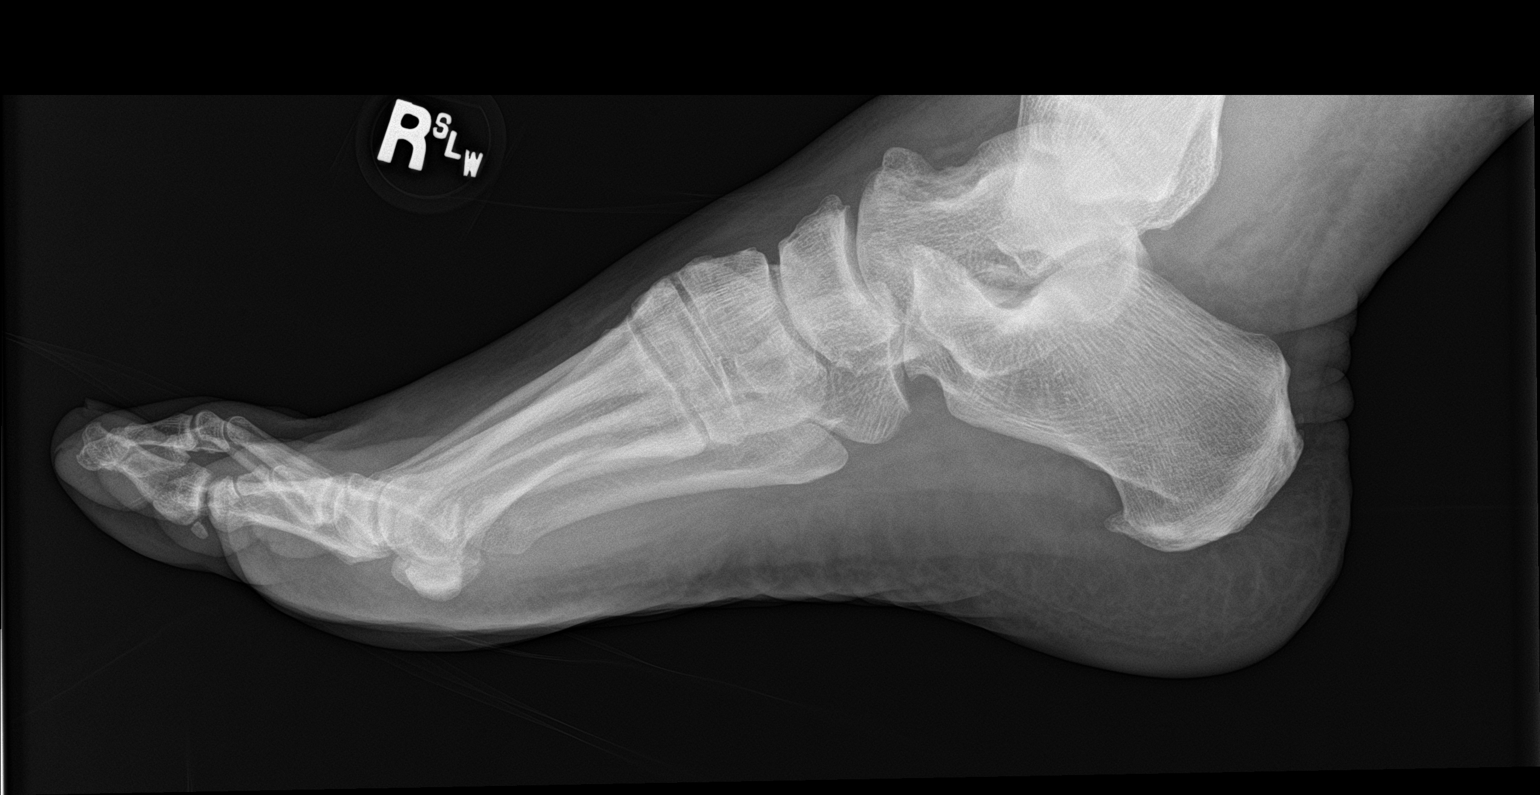

[3 of 3 positions shown; findings below may reference images not displayed]

FINDINGS: No evidence of acute, subacute or healed fractures. Joint spaces
well preserved for age. Well-preserved bone mineral density. Small
plantar calcaneal spur. Circumscribed lucency with sclerotic margins
involving the head of the first metatarsal. No other intrinsic
osseous abnormalities.
IMPRESSION: No significant osseous abnormalities. Small subchondral cyst
involving the head of the first metatarsal and small plantar
calcaneal spur.

## 2018-08-25 ENCOUNTER — Other Ambulatory Visit: Payer: Self-pay | Admitting: Family Medicine

## 2018-09-01 NOTE — Progress Notes (Signed)
Subjective:   John Morrow is a 67 y.o. male who presents for Medicare Annual/Subsequent preventive examination.  Review of Systems:  No ROS.  Medicare Wellness Visit. Additional risk factors are reflected in the social history.  Cardiac Risk Factors include: advanced age (>70men, >39 women);hypertension;male gender  Sleep patterns: getting 8 hours of sleep a night. Wakes up 1 time to go to the bathroom. Wakes up feeling rested. Home Safety/Smoke Alarms: Feels safe in home. Smoke alarms in place.  Living environment; Lives with wife and 2 daughters in a one story home. No steps in the house. Shower is a walk in shower no grab bars in place. Seat Belt Safety/Bike Helmet: Wears seat belt.     Male:   CCS- utd    PSA- utd Lab Results  Component Value Date   PSA 1.3 03/17/2018   PSA 1.2 05/05/2017   PSA 2.9 06/05/2016        Objective:    Vitals: There were no vitals taken for this visit.  There is no height or weight on file to calculate BMI.  Advanced Directives 09/06/2018 07/23/2017 05/29/2016  Does Patient Have a Medical Advance Directive? Yes Yes Yes  Type of Paramedic of [place];Living will Healthcare Power of [place];Out of facility DNR (pink MOST or yellow form);Living will  Does patient want to make changes to medical advance directive? No - Patient declined Yes (Inpatient - patient defers changing a medical advance directive at this time) No - Patient declined  Copy of [place] in Chart? Yes - validated most recent copy scanned in chart (See row information) Yes No - copy requested    Tobacco Social History   Tobacco Use  Smoking Status Never Smoker  Smokeless Tobacco Never Used     Counseling given: Not Answered   Clinical Intake:  Pre-visit preparation completed: Yes  Pain : 0-10 Pain Score: 2  Pain Type: Chronic pain Pain Location: Hip Pain Orientation: Right Pain  Radiating Towards: No radiating Pain Descriptors / Indicators: Aching Pain Onset: More than a month ago Pain Frequency: Constant Pain Relieving Factors: Tramadol helps some. Effect of Pain on Daily Activities: has some effect- he is waiting for hip replacement  Pain Relieving Factors: Tramadol helps some.  Nutritional Risks: None Diabetes: No  How often do you need to have someone help you when you read instructions, pamphlets, or other written materials from your doctor or pharmacy?: 1 - Never What is the last grade level you completed in school?: 18     Information entered by :: Orlie Dakin, LPN  Past Medical History:  Diagnosis Date  . Diverticulosis of colon without hemorrhage 06/12/2016   On Colonoscopy 11/06/2010  . HLD (hyperlipidemia) 05/29/2016  . Hyperlipemia   . Hypertension   . Morbid obesity ([place]) 05/29/2016   Past Surgical History:  Procedure Laterality Date  . KNEE SURGERY     Family History  Problem Relation Age of Onset  . Lung disease Mother   . Lung disease Father    Social History   Socioeconomic History  . Marital status: Married    Spouse name: [name]  . Number of children: 4  . Years of education: 46  . Highest education level: Master's degree (e.g., MA, MS, MEng, MEd, MSW, MBA)  Occupational History  . Occupation: retired    Comment: Pharmacist, hospital  Social Needs  . Financial resource strain: Not hard at all  . Food insecurity:  Worry: Never true    Inability: Never true  . Transportation needs:    Medical: No    Non-medical: No  Tobacco Use  . Smoking status: Never Smoker  . Smokeless tobacco: Never Used  Substance and Sexual Activity  . Alcohol use: Yes    Comment: occassionally  . Drug use: No  . Sexual activity: Yes    Partners: Female  Lifestyle  . Physical activity:    Days per week: 0 days    Minutes per session: 0 min  . Stress: Not at all  Relationships  . Social connections:    Talks on phone: More than three times a week     Gets together: Once a week    Attends religious service: More than 4 times per year    Active member of club or organization: No    Attends meetings of clubs or organizations: Never    Relationship status: Married  Other Topics Concern  . 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 Pt is waiting to have a hip replacement but haas to lose 7-8 lbs before surgery can be done. Drinks coffee daily. Drinks a lot of water daily.    Outpatient Encounter Medications as of 09/06/2018  Medication Sig  . amLODipine (NORVASC) 10 MG tablet TAKE 1 TABLET BY MOUTH ONCE DAILY  . buPROPion (WELLBUTRIN XL) 300 MG 24 hr tablet Take 1 tablet (300 mg total) by mouth daily.  . diclofenac sodium (VOLTAREN) 1 % GEL Apply 4 g topically 4 (four) times daily. To affected joint.  . furosemide (LASIX) 20 MG tablet TAKE 1 TABLET BY MOUTH ONCE DAILY  . lisinopril-hydrochlorothiazide (PRINZIDE,ZESTORETIC) 20-25 MG tablet TAKE 1 TABLET BY MOUTH ONCE DAILY  . omeprazole (PRILOSEC) 40 MG capsule Take 1 capsule (40 mg total) by mouth daily.  . polyethylene glycol powder (GLYCOLAX/MIRALAX) powder Take 17 g by mouth daily.  . simvastatin (ZOCOR) 40 MG tablet TAKE 1 TABLET BY MOUTH ONCE DAILY AT 6 IN THE EVENING  . tamsulosin (FLOMAX) 0.4 MG CAPS capsule Take 1 capsule (0.4 mg total) by mouth daily.  . traMADol (ULTRAM) 50 MG tablet TAKE 1 TABLET BY MOUTH TWICE DAILY FOR  CHRONIC  PAIN   No facility-administered encounter medications on file as of 09/06/2018.     Activities of Daily Living In your present state of health, do you have any difficulty performing the following activities: 09/06/2018  Hearing? N  Vision? N  Difficulty concentrating or making decisions? N  Walking or climbing stairs? N  Dressing or bathing? N  Doing errands, shopping? N  Preparing Food and eating ? N  Using the Toilet? N  In the past six months, have you accidently leaked urine? N  Do  you have problems with loss of bowel control? N  Managing your Medications? N  Managing your Finances? N  Housekeeping or managing your Housekeeping? N  Some recent data might be hidden    Patient Care Team: Gregor Hams, MD as PCP - General (Family Medicine)   Assessment:   This is a routine wellness examination for [place].Physical assessment deferred to PCP.   Exercise Activities and Dietary recommendations Current Exercise Habits: The patient does not participate in regular exercise at present, Exercise limited by: orthopedic condition(s)(waiting on hip replacement) Diet  Breakfast: Lunch:  Dinner:       Goals    . Exercise 3x per week (30 min per time)  Start to exercise once hip replacement has been done and all healed.       Fall Risk Fall Risk  09/06/2018 01/11/2018 06/17/2017 06/10/2016  Falls in the past year? 0 No No No  Risk for fall due to : Impaired mobility - - -   Is the patient's home free of loose throw rugs in walkways, pet beds, electrical cords, etc?   yes      Grab bars in the bathroom? no      Handrails on the stairs?   no      Adequate lighting?   yes  Depression Screen PHQ 2/9 Scores 09/06/2018 01/11/2018 05/05/2017 02/04/2017  PHQ - 2 Score 0 0 0 0    Cognitive Function     6CIT Screen 09/06/2018 06/17/2017 06/10/2016  What Year? 0 points 0 points 0 points  What month? 0 points 0 points 0 points  What time? 0 points 0 points 0 points  Count back from 20 0 points 0 points 0 points  Months in reverse 0 points 0 points 0 points  Repeat phrase 0 points 2 points 2 points  Total Score 0 2 2    Immunization History  Administered Date(s) Administered  . Influenza, High Dose Seasonal PF 05/05/2017  . Influenza,inj,Quad PF,6+ Mos 05/29/2016, 05/27/2018  . Pneumococcal Conjugate-13 06/10/2016  . Pneumococcal Polysaccharide-23 06/17/2017  . Tdap 05/29/2016  . Zoster 01/17/2012  . Zoster Recombinat (Shingrix) 11/29/2016, 01/30/2017     Screening Tests Health Maintenance  Topic Date Due  . COLONOSCOPY  11/06/2020  . TETANUS/TDAP  05/29/2026  . INFLUENZA VACCINE  Completed  . Hepatitis C Screening  Completed  . PNA vac Low Risk Adult  Completed        Plan:    Mr. Yoakum , Thank you for taking time to come for your Medicare Wellness Visit. I appreciate your ongoing commitment to your health goals. Please review the following plan we discussed and let me know if I can assist you in the future.   Please schedule your next medicare wellness visit with me in 1 yr. Continue doing brain stimulating activities (puzzles, reading, adult coloring books, staying active) to keep memory sharp.   These are the goals we discussed: Goals    . Exercise 3x per week (30 min per time)     Start to exercise once hip replacement has been done and all healed.       This is a list of the screening recommended for you and due dates:  Health Maintenance  Topic Date Due  . Colon Cancer Screening  11/06/2020  . Tetanus Vaccine  05/29/2026  . Flu Shot  Completed  .  Hepatitis C: One time screening is recommended by Center for Disease Control  (CDC) for  adults born from [birth year] through 1966.   Completed  . Pneumonia vaccines  Completed     I have personally reviewed and noted the following in the patient's chart:   . Medical and social history . Use of alcohol, tobacco or illicit drugs  . Current medications and supplements . Functional ability and status . Nutritional status . Physical activity . Advanced directives . List of other physicians . Hospitalizations, surgeries, and ER visits in previous 12 months . Vitals . Screenings to include cognitive, depression, and falls . Referrals and appointments  In addition, I have reviewed and discussed with patient certain preventive protocols, quality metrics, and best practice recommendations. A written personalized care plan for preventive services as  well as general  preventive health recommendations were provided to patient.     Joanne Chars, LPN  [id number]

## 2018-09-02 ENCOUNTER — Encounter (INDEPENDENT_AMBULATORY_CARE_PROVIDER_SITE_OTHER): Payer: Medicare Other

## 2018-09-06 ENCOUNTER — Ambulatory Visit (INDEPENDENT_AMBULATORY_CARE_PROVIDER_SITE_OTHER): Payer: Medicare Other | Admitting: *Deleted

## 2018-09-06 VITALS — BP 117/51 | HR 65 | Ht 71.0 in | Wt 287.0 lb

## 2018-09-06 DIAGNOSIS — Z0000 Encounter for general adult medical examination without abnormal findings: Secondary | ICD-10-CM | POA: Diagnosis not present

## 2018-09-06 NOTE — Patient Instructions (Signed)
Mr. John Morrow , Thank you for taking time to come for your Medicare Wellness Visit. I appreciate your ongoing commitment to your health goals. Please review the following plan we discussed and let me know if I can assist you in the future.   Please schedule your next medicare wellness visit with me in 1 yr. Continue doing brain stimulating activities (puzzles, reading, adult coloring books, staying active) to keep memory sharp.  These are the goals we discussed: Goals    . Exercise 3x per week (30 min per time)     Start to exercise once hip replacement has been done and all healed.     Health Maintenance After Age 48 After age 69, you are at a higher risk for certain long-term diseases and infections as well as injuries from falls. Falls are a major cause of broken bones and head injuries in people who are older than age 48. Getting regular preventive care can help to keep you healthy and well. Preventive care includes getting regular testing and making lifestyle changes as recommended by your health care provider. Talk with your health care provider about:  Which screenings and tests you should have. A screening is a test that checks for a disease when you have no symptoms.  A diet and exercise plan that is right for you. What should I know about screenings and tests to prevent falls? Screening and testing are the best ways to find a health problem early. Early diagnosis and treatment give you the best chance of managing medical conditions that are common after age 15. Certain conditions and lifestyle choices may make you more likely to have a fall. Your health care provider may recommend:  Regular vision checks. Poor vision and conditions such as cataracts can make you more likely to have a fall. If you wear glasses, make sure to get your prescription updated if your vision changes.  Medicine review. Work with your health care provider to regularly review all of the medicines you are taking,  including over-the-counter medicines. Ask your health care provider about any side effects that may make you more likely to have a fall. Tell your health care provider if any medicines that you take make you feel dizzy or sleepy.  Osteoporosis screening. Osteoporosis is a condition that causes the bones to get weaker. This can make the bones weak and cause them to break more easily.  Blood pressure screening. Blood pressure changes and medicines to control blood pressure can make you feel dizzy.  Strength and balance checks. Your health care provider may recommend certain tests to check your strength and balance while standing, walking, or changing positions.  Foot health exam. Foot pain and numbness, as well as not wearing proper footwear, can make you more likely to have a fall.  Depression screening. You may be more likely to have a fall if you have a fear of falling, feel emotionally low, or feel unable to do activities that you used to do.  Alcohol use screening. Using too much alcohol can affect your balance and may make you more likely to have a fall. What actions can I take to lower my risk of falls? General instructions  Talk with your health care provider about your risks for falling. Tell your health care provider if: ? You fall. Be sure to tell your health care provider about all falls, even ones that seem minor. ? You feel dizzy, sleepy, or off-balance.  Take over-the-counter and prescription medicines only as told  by your health care provider. These include any supplements.  Eat a healthy diet and maintain a healthy weight. A healthy diet includes low-fat dairy products, low-fat (lean) meats, and fiber from whole grains, beans, and lots of fruits and vegetables. Home safety  Remove any tripping hazards, such as rugs, cords, and clutter.  Install safety equipment such as grab bars in bathrooms and safety rails on stairs.  Keep rooms and walkways  well-lit. Activity   Follow a regular exercise program to stay fit. This will help you maintain your balance. Ask your health care provider what types of exercise are appropriate for you.  If you need a cane or walker, use it as recommended by your health care provider.  Wear supportive shoes that have nonskid soles. Lifestyle  Do not drink alcohol if your health care provider tells you not to drink.  If you drink alcohol, limit how much you have: ? 0-1 drink a day for women. ? 0-2 drinks a day for men.  Be aware of how much alcohol is in your drink. In the [place] one drink equals one typical bottle of beer (12 oz), one-half glass of wine (5 oz), or one shot of hard liquor (1 oz).  Do not use any products that contain nicotine or tobacco, such as cigarettes and e-cigarettes. If you need help quitting, ask your health care provider. Summary  Having a healthy lifestyle and getting preventive care can help to protect your health and wellness after age 7.  Screening and testing are the best way to find a health problem early and help you avoid having a fall. Early diagnosis and treatment give you the best chance for managing medical conditions that are more common for people who are older than age 23.  Falls are a major cause of broken bones and head injuries in people who are older than age 30. Take precautions to prevent a fall at home.  Work with your health care provider to learn what changes you can make to improve your health and wellness and to prevent falls. This information is not intended to replace advice given to you by your health care provider. Make sure you discuss any questions you have with your health care provider. Document Released: 07/15/2017 Document Revised: 07/15/2017 Document Reviewed: 07/15/2017 Elsevier Interactive Patient Education  2019 [employer].

## 2018-09-22 ENCOUNTER — Other Ambulatory Visit: Payer: Self-pay | Admitting: Family Medicine

## 2018-09-22 NOTE — Telephone Encounter (Signed)
Tramadol refilled. Patient researched [facility] Controlled Substance Reporting System.

## 2018-09-23 ENCOUNTER — Ambulatory Visit (INDEPENDENT_AMBULATORY_CARE_PROVIDER_SITE_OTHER): Payer: Medicare Other | Admitting: Family Medicine

## 2018-09-23 ENCOUNTER — Encounter (INDEPENDENT_AMBULATORY_CARE_PROVIDER_SITE_OTHER): Payer: Self-pay | Admitting: Family Medicine

## 2018-09-23 VITALS — BP 124/79 | HR 94 | Temp 98.4°F | Ht 69.0 in | Wt 277.0 lb

## 2018-09-23 DIAGNOSIS — Z0289 Encounter for other administrative examinations: Secondary | ICD-10-CM

## 2018-09-23 DIAGNOSIS — I10 Essential (primary) hypertension: Secondary | ICD-10-CM | POA: Diagnosis not present

## 2018-09-23 DIAGNOSIS — Z6841 Body Mass Index (BMI) 40.0 and over, adult: Secondary | ICD-10-CM | POA: Diagnosis not present

## 2018-09-23 DIAGNOSIS — Z1331 Encounter for screening for depression: Secondary | ICD-10-CM

## 2018-09-23 DIAGNOSIS — R0602 Shortness of breath: Secondary | ICD-10-CM | POA: Diagnosis not present

## 2018-09-23 DIAGNOSIS — E7849 Other hyperlipidemia: Secondary | ICD-10-CM

## 2018-09-23 DIAGNOSIS — R5383 Other fatigue: Secondary | ICD-10-CM | POA: Diagnosis not present

## 2018-09-23 DIAGNOSIS — E559 Vitamin D deficiency, unspecified: Secondary | ICD-10-CM

## 2018-09-23 DIAGNOSIS — E66813 Obesity, class 3: Secondary | ICD-10-CM

## 2018-09-23 DIAGNOSIS — R739 Hyperglycemia, unspecified: Secondary | ICD-10-CM

## 2018-09-27 NOTE — Progress Notes (Signed)
Office: [phone]  /  Fax: [phone]   Dear Dr. Gregor Hams,  Thank you for referring John Morrow to our clinic. The following note includes my evaluation and treatment recommendations.  HPI:   Chief Complaint: OBESITY    [name] has been referred by [name] for consultation regarding his obesity and obesity related comorbidities.    John Morrow (MR# [id number]) is a 68 y.o. male who presents on 09/23/2018 for obesity evaluation and treatment. Current BMI is Body mass index is 40.91 kg/m. John Morrow has been struggling with his weight for many years and has been unsuccessful in either losing weight, maintaining weight loss, or reaching his healthy weight goal.     John Morrow attended our information session and states he is currently in the action stage of change and ready to dedicate time achieving and maintaining a healthier weight. [name] is interested in becoming our patient and working on intensive lifestyle modifications including (but not limited to) diet, exercise and weight loss. He got lost on the way to our office and forgot to be fasting this morning. He needs to lose weight to have a hip replacement.    John Morrow states his family eats meals together his desired weight loss is 77 lbs he started gaining weight after college his heaviest weight ever was 310 lbs. he snacks frequently in the evenings he is frequently drinking liquids with calories he frequently eats larger portions than normal  he has binge eating behaviors he struggles with emotional eating    Fatigue John Morrow feels his energy is lower than it should be. This has worsened with weight gain and has not worsened recently. John Morrow denies daytime somnolence and denies waking up still tired. Patient is at risk for obstructive sleep apnea. Patient generally gets 8 hours of sleep per night, and states they generally have generally restful sleep. Snoring is present. Apneic episodes are not present.  Epworth Sleepiness Score is 4.  Dyspnea on exertion John Morrow notes increasing shortness of breath with exercising and seems to be worsening over time with weight gain. He notes getting out of breath sooner with activity than he used to. This has not gotten worse recently. John Morrow denies orthopnea.  Hyperglycemia John Morrow has had some elevated blood glucose readings without a diagnosis of diabetes. He is not on metformin and admits to polyphagia.  Hypertension John Morrow is a 68 y.o. male with hypertension. John Morrow blood pressure is stable on medications. He is would like to try to improve with diet and weight loss to help control his blood pressure with the goal of decreasing his risk of heart attack and stroke. [name] denies chest pain or shortness of breath on exertion.  Vitamin D deficiency [name] has a diagnosis of vitamin D deficiency. He is not currently taking vit D and does not have recent labs. He admits fatigue.  Hyperlipidemia John Morrow has hyperlipidemia and has been trying to improve his cholesterol levels with intensive lifestyle modification including a low saturated fat diet, exercise and weight loss. He is on simvastatin and denies any chest pain or myalgias.  Depression Screen John Morrow's Food and Mood (modified PHQ-9) score was 8. Depression screen PHQ 2/9 09/23/2018  Decreased Interest 3  Down, Depressed, Hopeless 1  PHQ - 2 Score 4  Altered sleeping 0  Tired, decreased energy 2  Change in appetite 1  Feeling bad or failure about yourself  1  Trouble concentrating 0  Suicidal thoughts 0  PHQ-9 Score 8  Difficult doing work/chores Not difficult  at all    ASSESSMENT AND PLAN:  Other fatigue - Plan: EKG 12-Lead, Prealbumin, T3, T4, free, TSH  Shortness of breath on exertion - Plan: CBC With Differential, Prealbumin  Hyperglycemia - Plan: Prealbumin, Comprehensive metabolic panel, Hemoglobin A1c, Insulin, random, Prealbumin  Essential hypertension  Vitamin D  deficiency - Plan: VITAMIN D 25 Hydroxy (Vit-D Deficiency, Fractures)  Other hyperlipidemia - Plan: Lipid Panel With LDL/HDL Ratio  Depression screening  Class 3 severe obesity with serious comorbidity and body mass index (BMI) of 40.0 to 44.9 in adult, unspecified obesity type (HCC)  PLAN:  Fatigue John Morrow was informed that his fatigue may be related to obesity, depression or many other causes. Labs will be ordered, and in the meanwhile Amadeo has agreed to work on diet, exercise and weight loss to help with fatigue. Proper sleep hygiene was discussed including the need for 7-8 hours of quality sleep each night. A sleep study was not ordered based on symptoms and Epworth score. An EKG and an indirect calorimetry were also ordered today. Day will follow up in 2 weeks.  Dyspnea on exertion John Morrow's shortness of breath appears to be obesity related and exercise induced. He has agreed to work on weight loss and gradually increase exercise to treat his exercise induced shortness of breath. If John Morrow follows our instructions and loses weight without improvement of his shortness of breath, we will plan to refer to pulmonology. We will monitor this condition regularly. Labs, an EKG, and an indirect calorimetry was ordered today. John Morrow agrees to this plan.  Hyperglycemia Fasting labs will be obtained and results with be discussed with John Morrow in 2 weeks at his follow up visit. In the meanwhile John Morrow was started on a lower simple carbohydrate diet and will work on weight loss efforts.  Hypertension We discussed sodium restriction, working on healthy weight loss, and a regular exercise program as the means to achieve improved blood pressure control. We will continue to monitor his blood pressure as well as his progress with the above lifestyle modifications. He will continue his medications as prescribed and will watch for signs of hypotension as he continues his lifestyle modifications. We will check  labs today and he agrees to start his diet. John Morrow agreed with this plan and agreed to follow up as directed.  Vitamin D Deficiency John Morrow was informed that low vitamin D levels contributes to fatigue and are associated with obesity, breast, and colon cancer. We will order labs and he agreed to follow up in 2 weeks.  Hyperlipidemia Natalio was informed of the American Heart Association Guidelines emphasizing intensive lifestyle modifications as the first line treatment for hyperlipidemia. We discussed many lifestyle modifications today in depth, and Shriyan will continue to work on decreasing saturated fats such as fatty red meat, butter and many fried foods. He will also increase vegetables and lean protein in his diet and continue to work on exercise and weight loss efforts. We will order labs today and he agreed to follow up as directed.  Depression Screen Chanson had a mildly positive depression screening. Depression is commonly associated with obesity and often results in emotional eating behaviors. We will monitor this closely and work on CBT to help improve the non-hunger eating patterns. Referral to Psychology may be required if no improvement is seen as he continues in our clinic.  Obesity [place] is currently in the action stage of change and his goal is to continue with weight loss efforts. I recommend Hussam begin the structured treatment plan  as follows:  He has agreed to follow the Category 3 plan. Adley has been instructed to eventually work up to a goal of 150 minutes of combined cardio and strengthening exercise per week for weight loss and overall health benefits. We discussed the following Behavioral Modification Strategies today: increasing lean protein intake, decreasing simple carbohydrates, and work on meal planning and easy cooking plans.   He was informed of the importance of frequent follow up visits to maximize his success with intensive lifestyle modifications for his  multiple health conditions. He was informed we would discuss his lab results at his next visit unless there is a critical issue that needs to be addressed sooner. Auron agreed to keep his next visit at the agreed upon time to discuss these results.  ALLERGIES: Allergies  Allergen Reactions  . Morphine And Related     Rash, vomiting, itching     MEDICATIONS: Current Outpatient Medications on File Prior to Visit  Medication Sig Dispense Refill  . amLODipine (NORVASC) 10 MG tablet TAKE 1 TABLET BY MOUTH ONCE DAILY 90 tablet 1  . buPROPion (WELLBUTRIN XL) 300 MG 24 hr tablet Take 1 tablet (300 mg total) by mouth daily. 90 tablet 0  . diclofenac sodium (VOLTAREN) 1 % GEL Apply 4 g topically 4 (four) times daily. To affected joint. 100 g 11  . furosemide (LASIX) 20 MG tablet TAKE 1 TABLET BY MOUTH ONCE DAILY 90 tablet 1  . lisinopril-hydrochlorothiazide (PRINZIDE,ZESTORETIC) 20-25 MG tablet TAKE 1 TABLET BY MOUTH ONCE DAILY 90 tablet 3  . simvastatin (ZOCOR) 40 MG tablet TAKE 1 TABLET BY MOUTH ONCE DAILY AT 6 IN THE EVENING 90 tablet 3  . tamsulosin (FLOMAX) 0.4 MG CAPS capsule Take 1 capsule (0.4 mg total) by mouth daily. 90 capsule 3  . traMADol (ULTRAM) 50 MG tablet Take 50 mg by mouth 2 (two) times daily.     No current facility-administered medications on file prior to visit.     PAST MEDICAL HISTORY: Past Medical History:  Diagnosis Date  . Back pain   . Diverticulosis of colon without hemorrhage 06/12/2016   On Colonoscopy 11/06/2010  . HLD (hyperlipidemia) 05/29/2016  . Hyperlipemia   . Hypertension   . Joint pain   . Morbid obesity ([place]) 05/29/2016  . Prediabetes   . Right hip pain    needs hip replacement  . Sleep apnea     PAST SURGICAL HISTORY: Past Surgical History:  Procedure Laterality Date  . KNEE SURGERY      SOCIAL HISTORY: Social History   Tobacco Use  . Smoking status: Never Smoker  . Smokeless tobacco: Never Used  Substance Use Topics  . Alcohol  use: Yes    Comment: occassionally  . Drug use: No    FAMILY HISTORY: Family History  Problem Relation Age of Onset  . Lung disease Mother   . Hypertension Mother   . Lung disease Father   . Alcoholism Father     ROS: Review of Systems  Constitutional: Positive for malaise/fatigue. Negative for weight loss.  Eyes: Positive for double vision.       Wears glasses and contacts.  Respiratory: Positive for shortness of breath.   Cardiovascular: Negative for chest pain and orthopnea.  Musculoskeletal: Positive for joint pain ( Hip). Negative for myalgias.  Neurological:       Negative for lightheadedness.  Endo/Heme/Allergies:       Positive for polyphagia.    PHYSICAL EXAM: Blood pressure 124/79, pulse 94, temperature 98.4  F (36.9 C), temperature source Oral, height 5\' 9"  (1.753 m), weight 277 lb (125.6 kg), SpO2 98 %. Body mass index is 40.91 kg/m. Physical Exam Vitals signs reviewed.  Constitutional:      Appearance: Normal appearance. He is obese.  HENT:     Head: Normocephalic and atraumatic.     Nose: Nose normal.  Eyes:     General: No scleral icterus.    Extraocular Movements: Extraocular movements intact.  Neck:     Musculoskeletal: Normal range of motion and neck supple.     Comments: Negative for thyromegaly. Cardiovascular:     Rate and Rhythm: Normal rate and regular rhythm.  Pulmonary:     Effort: Pulmonary effort is normal. No respiratory distress.  Abdominal:     Palpations: Abdomen is soft.     Tenderness: There is no abdominal tenderness.     Comments: Positive for obesity.  Musculoskeletal:     Comments: ROM normal in all extremities.  Skin:    General: Skin is warm and dry.  Neurological:     Mental Status: He is alert and oriented to person, place, and time.     Coordination: Coordination normal.  Psychiatric:        Mood and Affect: Mood normal.        Behavior: Behavior normal.     RECENT LABS AND TESTS: BMET    Component Value  Date/Time   NA 141 04/02/2018 1550   K 3.8 04/02/2018 1550   CL 103 04/02/2018 1550   CO2 29 04/02/2018 1550   GLUCOSE 94 04/02/2018 1550   BUN 17 04/02/2018 1550   CREATININE 1.14 04/02/2018 1550   CALCIUM 9.0 04/02/2018 1550   GFRNONAA 64 03/17/2018 0954   GFRAA 75 03/17/2018 0954   Lab Results  Component Value Date   HGBA1C 5.4 06/05/2016   No results found for: INSULIN CBC    Component Value Date/Time   WBC 10.1 04/05/2018 0929   RBC 5.33 04/05/2018 0929   HGB 15.2 04/05/2018 0929   HCT 45.2 04/05/2018 0929   PLT 301 04/05/2018 0929   MCV 84.8 04/05/2018 0929   MCH 28.5 04/05/2018 0929   MCHC 33.6 04/05/2018 0929   RDW 12.4 04/05/2018 0929   LYMPHSABS 1,030 04/05/2018 0929   EOSABS 91 04/05/2018 0929   BASOSABS 51 04/05/2018 0929   Iron/TIBC/Ferritin/ %Sat No results found for: IRON, TIBC, FERRITIN, IRONPCTSAT Lipid Panel     Component Value Date/Time   CHOL 120 03/17/2018 0954   TRIG 98 03/17/2018 0954   HDL 40 (L) 03/17/2018 0954   CHOLHDL 3.0 03/17/2018 0954   VLDL 19 06/05/2016 0827   LDLCALC 62 03/17/2018 0954   Hepatic Function Panel     Component Value Date/Time   PROT 6.4 04/02/2018 1550   ALBUMIN 4.1 05/05/2017 0734   AST 14 04/02/2018 1550   ALT 12 04/02/2018 1550   ALKPHOS 49 05/05/2017 0734   BILITOT 0.6 04/02/2018 1550      Component Value Date/Time   TSH 1.18 06/05/2016 0827   ECG  shows NSR with a rate of 110 BPM. INDIRECT CALORIMETER done today shows a VO2 of 309 and a REE of 2147. His calculated basal metabolic rate is 1017 thus his basal metabolic rate is worse than expected.  OBESITY BEHAVIORAL INTERVENTION VISIT  Today's visit was # 1   Starting weight: 277 lbs Starting date: 09/23/2018 Today's weight : Weight: 277 lb (125.6 kg)  Today's date: 09/23/2018 Total lbs lost to  date: 0 At least 15 minutes were spent on discussing the following behavioral intervention visit.  ASK: We discussed the diagnosis of obesity with [name] today and [place] agreed to give [name] permission to discuss obesity behavioral modification therapy today.  ASSESS: Ananth has the diagnosis of obesity and his BMI today is 40.8. Graham is in the action stage of change.   ADVISE: Prophet was educated on the multiple health risks of obesity as well as the benefit of weight loss to improve his health. He was advised of the need for long term treatment and the importance of lifestyle modifications to improve his current health and to decrease his risk of future health problems.  AGREE: Multiple dietary modification options and treatment options were discussed and Coben agreed to follow the recommendations documented in the above note.  ARRANGE: Nils was educated on the importance of frequent visits to treat obesity as outlined per CMS and USPSTF guidelines and agreed to schedule his next follow up appointment today.  I, Marcille Blanco, am acting as transcriptionist for Starlyn Skeans, MD   I have reviewed the above documentation for accuracy and completeness, and I agree with the above. -Dennard Nip, MD

## 2018-09-28 ENCOUNTER — Encounter: Payer: Self-pay | Admitting: Family Medicine

## 2018-09-28 ENCOUNTER — Ambulatory Visit: Payer: Medicare Other | Admitting: Family Medicine

## 2018-09-28 VITALS — BP 115/74 | HR 82 | Ht 70.0 in | Wt 277.0 lb

## 2018-09-28 DIAGNOSIS — R7989 Other specified abnormal findings of blood chemistry: Secondary | ICD-10-CM

## 2018-09-28 DIAGNOSIS — G8929 Other chronic pain: Secondary | ICD-10-CM | POA: Insufficient documentation

## 2018-09-28 DIAGNOSIS — M1611 Unilateral primary osteoarthritis, right hip: Secondary | ICD-10-CM

## 2018-09-28 DIAGNOSIS — G894 Chronic pain syndrome: Secondary | ICD-10-CM

## 2018-09-28 DIAGNOSIS — I10 Essential (primary) hypertension: Secondary | ICD-10-CM

## 2018-09-28 DIAGNOSIS — R748 Abnormal levels of other serum enzymes: Secondary | ICD-10-CM | POA: Diagnosis not present

## 2018-09-28 DIAGNOSIS — R1084 Generalized abdominal pain: Secondary | ICD-10-CM

## 2018-09-28 LAB — COMPREHENSIVE METABOLIC PANEL
ALT: 17 IU/L (ref 0–44)
AST: 17 IU/L (ref 0–40)
Albumin/Globulin Ratio: 2 (ref 1.2–2.2)
Albumin: 4.5 g/dL (ref 3.6–4.8)
Alkaline Phosphatase: 61 IU/L (ref 39–117)
BILIRUBIN TOTAL: 0.7 mg/dL (ref 0.0–1.2)
BUN/Creatinine Ratio: 19 (ref 10–24)
BUN: 28 mg/dL — AB (ref 8–27)
CHLORIDE: 101 mmol/L (ref 96–106)
CO2: 25 mmol/L (ref 20–29)
CREATININE: 1.5 mg/dL — AB (ref 0.76–1.27)
Calcium: 9.7 mg/dL (ref 8.6–10.2)
GFR calc Af Amer: 55 mL/min/{1.73_m2} — ABNORMAL LOW (ref >59)
GFR calc non Af Amer: 47 mL/min/{1.73_m2} — ABNORMAL LOW (ref >59)
GLUCOSE: 98 mg/dL (ref 65–99)
Globulin, Total: 2.3 g/dL (ref 1.5–4.5)
Potassium: 4.3 mmol/L (ref 3.5–5.2)
Sodium: 144 mmol/L (ref 134–144)
Total Protein: 6.8 g/dL (ref 6.0–8.5)

## 2018-09-28 LAB — HEMOGLOBIN A1C
ESTIMATED AVERAGE GLUCOSE: 108 mg/dL
HEMOGLOBIN A1C: 5.4 % (ref 4.8–5.6)

## 2018-09-28 LAB — CBC WITH DIFFERENTIAL
BASOS ABS: 0 10*3/uL (ref 0.0–0.2)
Basos: 1 %
EOS (ABSOLUTE): 0.1 10*3/uL (ref 0.0–0.4)
Eos: 2 %
Hematocrit: 44.4 % (ref 37.5–51.0)
Hemoglobin: 15 g/dL (ref 13.0–17.7)
Immature Grans (Abs): 0 10*3/uL (ref 0.0–0.1)
Immature Granulocytes: 0 %
LYMPHS ABS: 1.3 10*3/uL (ref 0.7–3.1)
Lymphs: 23 %
MCH: 30.3 pg (ref 26.6–33.0)
MCHC: 33.8 g/dL (ref 31.5–35.7)
MCV: 90 fL (ref 79–97)
MONOS ABS: 0.6 10*3/uL (ref 0.1–0.9)
Monocytes: 11 %
NEUTROS PCT: 63 %
Neutrophils Absolute: 3.7 10*3/uL (ref 1.4–7.0)
RBC: 4.95 x10E6/uL (ref 4.14–5.80)
RDW: 11.9 % (ref 11.6–15.4)
WBC: 5.7 10*3/uL (ref 3.4–10.8)

## 2018-09-28 LAB — LIPID PANEL WITH LDL/HDL RATIO
Cholesterol, Total: 113 mg/dL (ref 100–199)
HDL: 43 mg/dL (ref >39)
LDL Calculated: 51 mg/dL (ref 0–99)
LDl/HDL Ratio: 1.2 ratio (ref 0.0–3.6)
TRIGLYCERIDES: 95 mg/dL (ref 0–149)
VLDL Cholesterol Cal: 19 mg/dL (ref 5–40)

## 2018-09-28 LAB — VITAMIN D 25 HYDROXY (VIT D DEFICIENCY, FRACTURES): VIT D 25 HYDROXY: 33.7 ng/mL (ref 30.0–100.0)

## 2018-09-28 LAB — PREALBUMIN: PREALBUMIN: 28 mg/dL (ref 10–36)

## 2018-09-28 LAB — TSH: TSH: 1.04 u[IU]/mL (ref 0.450–4.500)

## 2018-09-28 LAB — INSULIN, RANDOM: INSULIN: 17.7 u[IU]/mL (ref 2.6–24.9)

## 2018-09-28 LAB — T4, FREE: FREE T4: 1.4 ng/dL (ref 0.82–1.77)

## 2018-09-28 LAB — T3: T3, Total: 95 ng/dL (ref 71–180)

## 2018-09-28 MED ORDER — OMEPRAZOLE 40 MG PO CPDR: 40.0000 mg | DELAYED_RELEASE_CAPSULE | Freq: Every day | ORAL | 3 refills | Status: DC

## 2018-09-28 MED ORDER — POLYETHYLENE GLYCOL 3350 17 GM/SCOOP PO POWD: 17.0000 g | Freq: Every day | ORAL | 3 refills | Status: DC

## 2018-09-28 NOTE — Progress Notes (Signed)
John Morrow is a 68 y.o. male who presents to [place]: [facility] today for follow-up chronic kidney disease, pain, abdominal pain and weight loss.  John Morrow has been seen at outpatient medical management by Dr. Leafy Ro.  He feels as though things are going well and has modified his diet.  Since a max weight in July of 203 pounds he is managed to lose down to 277 pounds.  He notes that his BMI needs to be comfortably under 40 in order to undergo right total hip replacement.  He is happy with how things are going.  As part of his work-up John Morrow had metabolic panel obtained.  His creatinine was elevated at 1.5 with a GFR of around 47.  This is up from his baseline.  He takes medications listed below for hypertension management.  He additionally uses Lasix 20 mg daily for leg swelling prevention.  Additionally he notes some subtle abdominal pain.  He has noted abdominal discomfort now for months.  He had a CT scan in July 2019 that showed diverticulitis.  He notes his current pain is nothing like diverticulitis but he continues to have subtle discomfort.  He is not sure if it changes the food or with bowel movements or urination.  He notes sometimes he is constipated and uses a stool softener.  He denies any significant acid reflux symptoms.  Additionally he continues to experience chronic right hip pain.  He has been found to have significant degenerative changes and is noted above is planning on a total hip replacement.  He uses tramadol as prescribed below chronically for pain control which does help.  Additionally he uses Tylenol.  He does not take ibuprofen or Aleve frequently at all.   ROS as above:  Exam:  BP 115/74   Pulse 82   Ht 5\' 10"  (1.778 m)   Wt 277 lb (125.6 kg)   BMI 39.75 kg/m  Wt Readings from Last 10 Encounters:  09/28/18 277 lb (125.6 kg)  09/23/18 277 lb (125.6 kg)    09/06/18 287 lb (130.2 kg)  06/28/18 290 lb (131.5 kg)  06/15/18 289 lb (131.1 kg)  05/27/18 291 lb (132 kg)  04/16/18 296 lb (134.3 kg)  04/05/18 296 lb (134.3 kg)  04/02/18 276 lb (125.2 kg)  03/17/18 (!) 302 lb (137 kg)     Gen: Well NAD HEENT: EOMI,  MMM Lungs: Normal work of breathing. CTABL Heart: RRR no MRG Abd: NABS, Soft. Nondistended, Nontender Exts: Brisk capillary refill, warm and well perfused.  Right hip normal-appearing antalgic gait decreased range of motion.  Lab and Radiology Results Results for orders placed or performed in visit on 09/23/18 (from the past 72 hour(s))  CBC With Differential     Status: None   Collection Time: 09/27/18  7:55 AM  Result Value Ref Range   WBC 5.7 3.4 - 10.8 x10E3/uL   RBC 4.95 4.14 - 5.80 x10E6/uL   Hemoglobin 15.0 13.0 - 17.7 g/dL   Hematocrit 44.4 37.5 - 51.0 %   MCV 90 79 - 97 fL   MCH 30.3 26.6 - 33.0 pg   MCHC 33.8 31.5 - 35.7 g/dL   RDW 11.9 11.6 - 15.4 %    Comment:               **Please note reference interval change**   Neutrophils 63 Not Estab. %   Lymphs 23 Not Estab. %   Monocytes 11 Not  Estab. %   Eos 2 Not Estab. %   Basos 1 Not Estab. %   Neutrophils Absolute 3.7 1.4 - 7.0 x10E3/uL   Lymphocytes Absolute 1.3 0.7 - 3.1 x10E3/uL   Monocytes Absolute 0.6 0.1 - 0.9 x10E3/uL   EOS (ABSOLUTE) 0.1 0.0 - 0.4 x10E3/uL   Basophils Absolute 0.0 0.0 - 0.2 x10E3/uL   Immature Granulocytes 0 Not Estab. %   Immature Grans (Abs) 0.0 0.0 - 0.1 x10E3/uL  Comprehensive metabolic panel     Status: Abnormal   Collection Time: 09/27/18  7:55 AM  Result Value Ref Range   Glucose 98 65 - 99 mg/dL   BUN 28 (H) 8 - 27 mg/dL   Creatinine, Ser 1.50 (H) 0.76 - 1.27 mg/dL   GFR calc non Af Amer 47 (L) >59 mL/min/1.73   GFR calc Af Amer 55 (L) >59 mL/min/1.73   BUN/Creatinine Ratio 19 10 - 24   Sodium 144 134 - 144 mmol/L   Potassium 4.3 3.5 - 5.2 mmol/L   Chloride 101 96 - 106 mmol/L   CO2 25 20 - 29 mmol/L   Calcium 9.7  8.6 - 10.2 mg/dL   Total Protein 6.8 6.0 - 8.5 g/dL   Albumin 4.5 3.6 - 4.8 g/dL    Comment:     **Effective October 04, 2018 Albumin reference**       interval will be changing to:              Age                Male          Male           0 -  7 days        3.6 - 4.9      3.6 - 4.9           8 - 30 days        3.4 - 4.7      3.4 - 4.7           1 -  6 month       3.7 - 4.8      3.7 - 4.8    7 months -  2 years       3.9 - 5.0      3.9 - 5.0           3 -  5 years       4.0 - 5.0      4.0 - 5.0           6 - 12 years       4.1 - 5.0      4.0 - 5.0          13 - 30 years       4.1 - 5.2      3.9 - 5.0          31 - 50 years       4.0 - 5.0      3.8 - 4.8          51 - 60 years       3.8 - 4.9      3.8 - 4.9          61 - 70 years       3.8 - 4.8      3.8 - 4.8          71 - 80 years  3.7 - 4.7      3.7 - 4.7          81 - 89 years       3.6 - 4.6      3.6 - 4.6              >89 years       3.5 - 4.6      3.5 - 4.6    Globulin, Total 2.3 1.5 - 4.5 g/dL   Albumin/Globulin Ratio 2.0 1.2 - 2.2   Bilirubin Total 0.7 0.0 - 1.2 mg/dL   Alkaline Phosphatase 61 39 - 117 IU/L   AST 17 0 - 40 IU/L   ALT 17 0 - 44 IU/L  Hemoglobin A1c     Status: None   Collection Time: 09/27/18  7:55 AM  Result Value Ref Range   Hgb A1c MFr Bld 5.4 4.8 - 5.6 %    Comment:          Prediabetes: 5.7 - 6.4          Diabetes: >6.4          Glycemic control for adults with diabetes: <7.0    Est. average glucose Bld gHb Est-mCnc 108 mg/dL  Insulin, random     Status: None   Collection Time: 09/27/18  7:55 AM  Result Value Ref Range   INSULIN 17.7 2.6 - 24.9 uIU/mL  Lipid Panel With LDL/HDL Ratio     Status: None   Collection Time: 09/27/18  7:55 AM  Result Value Ref Range   Cholesterol, Total 113 100 - 199 mg/dL   Triglycerides 95 0 - 149 mg/dL   HDL 43 >39 mg/dL   VLDL Cholesterol Cal 19 5 - 40 mg/dL   LDL Calculated 51 0 - 99 mg/dL   LDl/HDL Ratio 1.2 0.0 - 3.6 ratio    Comment:                                      LDL/HDL Ratio                                             Men  Women                               1/2 Avg.Risk  1.0    1.5                                   Avg.Risk  3.6    3.2                                2X Avg.Risk  6.2    5.0                                3X Avg.Risk  8.0    6.1   T3     Status: None   Collection Time: 09/27/18  7:55 AM  Result Value Ref Range   T3, Total 95 71 - 180 ng/dL  T4, free  Status: None   Collection Time: 09/27/18  7:55 AM  Result Value Ref Range   Free T4 1.40 0.82 - 1.77 ng/dL  TSH     Status: None   Collection Time: 09/27/18  7:55 AM  Result Value Ref Range   TSH 1.040 0.450 - 4.500 uIU/mL  VITAMIN D 25 Hydroxy (Vit-D Deficiency, Fractures)     Status: None   Collection Time: 09/27/18  7:55 AM  Result Value Ref Range   Vit D, 25-Hydroxy 33.7 30.0 - 100.0 ng/mL    Comment: Vitamin D deficiency has been defined by the [place] and an Endocrine Society practice guideline as a level of serum 25-OH vitamin D less than 20 ng/mL (1,2). The Endocrine Society went on to further define vitamin D insufficiency as a level between 21 and 29 ng/mL (2). 1. IOM (Institute of Medicine). 2010. Dietary reference    intakes for calcium and D. [place]: The    [employer]. 2. Holick MF, Binkley [place], Bischoff-Ferrari HA, et al.    Evaluation, treatment, and prevention of vitamin D    deficiency: an Endocrine Society clinical practice    guideline. JCEM. 2011 Jul; 96(7):1911-30.   Prealbumin     Status: None   Collection Time: 09/27/18  7:55 AM  Result Value Ref Range   PREALBUMIN 28 10 - 36 mg/dL   No results found.    Assessment and Plan: 68 y.o. male with  Increased creatinine: Increased from baseline of about 1.14.  Unsure etiology could be slow progression chronic kidney disease.  Plan to discontinue Lasix as this may be a contributing factor.  Linda recheck metabolic panel in about a  month.  Hypertension: Blood pressure is actually controlled.  I am stopping Lasix as above.  If he develops leg swelling he could potentially try stopping amlodipine.  This may be contributing to potential leg swelling and he may not need it with weight loss improving his blood pressure.  Weight loss: Doing extremely well.  I am thankful that Dr. Leafy Ro and her group are involved.  I think it would be very helpful for Bob.  Although he is at his initial goal for BMI less than 40 for total hip replacement he certainly still has more weight to lose and I am optimistic in his ability to continue to slowly lose weight in a sustainable fashion over the next months and years.  Chronic pain: Continue tramadol.  Hopeful for total hip replacement in the future.  Abdominal pain: Unclear etiology.  May be constipation related.  Plan for trial of MiraLAX.  Additionally may be related to possible acid reflux.  Plan for trial of omeprazole.  If not better neck step would likely be referred to gastroenterology.  Patient would like to stay local therefore digestive health specialist would probably be first choice.  Recheck with me in 3 months or sooner if needed.  PDMP reviewed during this encounter. Orders Placed This Encounter  Procedures  . COMPLETE METABOLIC PANEL WITH GFR   Meds ordered this encounter  Medications  . omeprazole (PRILOSEC) 40 MG capsule    Sig: Take 1 capsule (40 mg total) by mouth daily.    Dispense:  30 capsule    Refill:  3  . polyethylene glycol powder (GLYCOLAX/MIRALAX) powder    Sig: Take 17 g by mouth daily.    Dispense:  850 g    Refill:  3     Historical information moved to improve visibility of  documentation.  Past Medical History:  Diagnosis Date  . Back pain   . Diverticulosis of colon without hemorrhage 06/12/2016   On Colonoscopy 11/06/2010  . HLD (hyperlipidemia) 05/29/2016  . Hyperlipemia   . Hypertension   . Joint pain   . Morbid obesity ([place]) 05/29/2016   . Prediabetes   . Right hip pain    needs hip replacement  . Sleep apnea    Past Surgical History:  Procedure Laterality Date  . KNEE SURGERY     Social History   Tobacco Use  . Smoking status: Never Smoker  . Smokeless tobacco: Never Used  Substance Use Topics  . Alcohol use: Yes    Comment: occassionally   family history includes Alcoholism in his father; Hypertension in his mother; Lung disease in his father and mother.  Medications: Current Outpatient Medications  Medication Sig Dispense Refill  . amLODipine (NORVASC) 10 MG tablet TAKE 1 TABLET BY MOUTH ONCE DAILY 90 tablet 1  . buPROPion (WELLBUTRIN XL) 300 MG 24 hr tablet Take 1 tablet (300 mg total) by mouth daily. 90 tablet 0  . diclofenac sodium (VOLTAREN) 1 % GEL Apply 4 g topically 4 (four) times daily. To affected joint. 100 g 11  . furosemide (LASIX) 20 MG tablet TAKE 1 TABLET BY MOUTH ONCE DAILY 90 tablet 1  . lisinopril-hydrochlorothiazide (PRINZIDE,ZESTORETIC) 20-25 MG tablet TAKE 1 TABLET BY MOUTH ONCE DAILY 90 tablet 3  . simvastatin (ZOCOR) 40 MG tablet TAKE 1 TABLET BY MOUTH ONCE DAILY AT 6 IN THE EVENING 90 tablet 3  . tamsulosin (FLOMAX) 0.4 MG CAPS capsule Take 1 capsule (0.4 mg total) by mouth daily. 90 capsule 3  . traMADol (ULTRAM) 50 MG tablet Take 50 mg by mouth 2 (two) times daily.    Marland Kitchen omeprazole (PRILOSEC) 40 MG capsule Take 1 capsule (40 mg total) by mouth daily. 30 capsule 3  . polyethylene glycol powder (GLYCOLAX/MIRALAX) powder Take 17 g by mouth daily. 850 g 3   No current facility-administered medications for this visit.    Allergies  Allergen Reactions  . Morphine And Related     Rash, vomiting, itching      Discussed warning signs or symptoms. Please see discharge instructions. Patient expresses understanding.

## 2018-09-28 NOTE — Patient Instructions (Addendum)
Thank you for coming in today. 1) For abdominal pain. We will try miralax daily to help reduce constipation as a possible source of abdominal pain.  Additionally we will try the acid blocker Omeprazole (prilosec).  If pain is not controlled next step is referral to gastroenterology and likely colonoscopy/endoscopy.   2) For kidneys try stopping the lasix (furesomide).  Plan to recheck kidney function in 1 month.  You can swing bu the lab then.   If you have leg swelling consider stopping amlodipine.    If all is well recheck in 3 months or sooner.

## 2018-10-07 ENCOUNTER — Encounter (INDEPENDENT_AMBULATORY_CARE_PROVIDER_SITE_OTHER): Payer: Self-pay | Admitting: Family Medicine

## 2018-10-07 ENCOUNTER — Ambulatory Visit (INDEPENDENT_AMBULATORY_CARE_PROVIDER_SITE_OTHER): Payer: Medicare Other | Admitting: Family Medicine

## 2018-10-07 ENCOUNTER — Encounter: Payer: Self-pay | Admitting: Family Medicine

## 2018-10-07 VITALS — BP 123/80 | HR 78 | Temp 98.3°F | Ht 69.0 in | Wt 279.0 lb

## 2018-10-07 DIAGNOSIS — E66813 Obesity, class 3: Secondary | ICD-10-CM

## 2018-10-07 DIAGNOSIS — N189 Chronic kidney disease, unspecified: Secondary | ICD-10-CM

## 2018-10-07 DIAGNOSIS — Z6841 Body Mass Index (BMI) 40.0 and over, adult: Secondary | ICD-10-CM | POA: Diagnosis not present

## 2018-10-07 DIAGNOSIS — E88819 Insulin resistance, unspecified: Secondary | ICD-10-CM

## 2018-10-07 DIAGNOSIS — E8881 Metabolic syndrome: Secondary | ICD-10-CM | POA: Diagnosis not present

## 2018-10-07 DIAGNOSIS — N289 Disorder of kidney and ureter, unspecified: Secondary | ICD-10-CM

## 2018-10-07 DIAGNOSIS — E559 Vitamin D deficiency, unspecified: Secondary | ICD-10-CM

## 2018-10-07 MED ORDER — VITAMIN D (ERGOCALCIFEROL) 1.25 MG (50000 UNIT) PO CAPS: 50000.0000 [IU] | ORAL_CAPSULE | ORAL | 0 refills | Status: DC

## 2018-10-07 NOTE — Progress Notes (Signed)
Office: [phone]  /  Fax: [phone]   HPI:   Chief Complaint: OBESITY John Morrow is here to discuss his progress with his obesity treatment plan. He is on the Category 3 plan and is following his eating plan approximately 80-85 % of the time. He states he is exercising 0 minutes 0 times per week. John Morrow has actually done well with weight loss on his Category 3. He notes his hunger is controlled and he is doing well. He is up 2 lbs, but down 3 lbs of fat which is likely due to increased water weight from stopping his Lasix.  His weight is 279 lb (126.6 kg) today and has gained 2 pounds since his last visit. He has lost 0 lbs since starting treatment with [name].  Vitamin D Deficiency John Morrow has a new diagnosis of vitamin D deficiency. He is not on Vit D, he notes fatigue and denies nausea, vomiting or muscle weakness.  Insulin Resistance John Morrow has a new diagnosis of insulin resistance based on his elevated fasting insulin level >5. His A1c and glucose is normal, but fasting insulin is elevated. Although John Morrow's blood glucose readings are still under good control, insulin resistance puts him at greater risk of metabolic syndrome and diabetes. He is not taking metformin currently and he notes his hunger was controlled on his Category 3 plan. He continues to work on diet and exercise to decrease risk of diabetes.  Chronic Renal Impairment John Morrow's creatinine and BUN were elevated recently and his primary care physician discontinued his Lasix recently.  ASSESSMENT AND PLAN:  Vitamin D deficiency - Plan: Vitamin D, Ergocalciferol, (DRISDOL) 1.25 MG (50000 UT) CAPS capsule  Insulin resistance  Chronic renal impairment, unspecified CKD stage  Class 3 severe obesity with serious comorbidity and body mass index (BMI) of 40.0 to 44.9 in adult, unspecified obesity type ([place])  PLAN:  Vitamin D Deficiency John Morrow was informed that low vitamin D levels contributes to fatigue and are associated  with obesity, breast, and colon cancer. John Morrow agrees to start prescription Vit D @50 ,000 IU every week #4 with no refills. He will follow up for routine testing of vitamin D, at least 2-3 times per year. He was informed of the risk of over-replacement of vitamin D and agrees to not increase his dose unless he discusses this with [name] first. We will recheck labs in 3 months. John Morrow agrees to follow up with our clinic in 2 to 3 weeks.  Insulin Resistance John Morrow will continue to work on weight loss, diet, exercise, and decreasing simple carbohydrates in his diet to help decrease the risk of diabetes. We dicussed metformin including benefits and risks. He was informed that eating too many simple carbohydrates or too many calories at one sitting increases the likelihood of GI side effects. Hagan declined metformin for now and prescription was not written today. We will recheck labs in 3 months. John Morrow agrees to follow up with our clinic in 2 to 3 weeks as directed to monitor his progress.  Chronic Renal Impairment John Morrow will continue his diet and decrease Na+, and we will recheck labs at next visit. John Morrow agrees to follow up with our clinic in 2 to 3 weeks.  Obesity John Morrow is currently in the action stage of change. As such, his goal is to continue with weight loss efforts He has agreed to follow the Category 3 plan John Morrow has been instructed to work up to a goal of 150 minutes of combined cardio and strengthening exercise per week for  weight loss and overall health benefits. We discussed the following Behavioral Modification Strategies today: increasing lean protein intake, decreasing simple carbohydrates  and work on meal planning and easy cooking plans   John Morrow has agreed to follow up with our clinic in 2 to 3 weeks. He was informed of the importance of frequent follow up visits to maximize his success with intensive lifestyle modifications for his multiple health conditions.  ALLERGIES: Allergies   Allergen Reactions  . Morphine And Related     Rash, vomiting, itching     MEDICATIONS: Current Outpatient Medications on File Prior to Visit  Medication Sig Dispense Refill  . amLODipine (NORVASC) 10 MG tablet TAKE 1 TABLET BY MOUTH ONCE DAILY 90 tablet 1  . buPROPion (WELLBUTRIN XL) 300 MG 24 hr tablet Take 1 tablet (300 mg total) by mouth daily. 90 tablet 0  . diclofenac sodium (VOLTAREN) 1 % GEL Apply 4 g topically 4 (four) times daily. To affected joint. 100 g 11  . furosemide (LASIX) 20 MG tablet TAKE 1 TABLET BY MOUTH ONCE DAILY 90 tablet 1  . lisinopril-hydrochlorothiazide (PRINZIDE,ZESTORETIC) 20-25 MG tablet TAKE 1 TABLET BY MOUTH ONCE DAILY 90 tablet 3  . omeprazole (PRILOSEC) 40 MG capsule Take 1 capsule (40 mg total) by mouth daily. 30 capsule 3  . polyethylene glycol powder (GLYCOLAX/MIRALAX) powder Take 17 g by mouth daily. 850 g 3  . simvastatin (ZOCOR) 40 MG tablet TAKE 1 TABLET BY MOUTH ONCE DAILY AT 6 IN THE EVENING 90 tablet 3  . tamsulosin (FLOMAX) 0.4 MG CAPS capsule Take 1 capsule (0.4 mg total) by mouth daily. 90 capsule 3  . traMADol (ULTRAM) 50 MG tablet Take 50 mg by mouth 2 (two) times daily.     No current facility-administered medications on file prior to visit.     PAST MEDICAL HISTORY: Past Medical History:  Diagnosis Date  . Back pain   . Diverticulosis of colon without hemorrhage 06/12/2016   On Colonoscopy 11/06/2010  . HLD (hyperlipidemia) 05/29/2016  . Hyperlipemia   . Hypertension   . Joint pain   . Morbid obesity ([place]) 05/29/2016  . Prediabetes   . Right hip pain    needs hip replacement  . Sleep apnea     PAST SURGICAL HISTORY: Past Surgical History:  Procedure Laterality Date  . KNEE SURGERY      SOCIAL HISTORY: Social History   Tobacco Use  . Smoking status: Never Smoker  . Smokeless tobacco: Never Used  Substance Use Topics  . Alcohol use: Yes    Comment: occassionally  . Drug use: No    FAMILY HISTORY: Family  History  Problem Relation Age of Onset  . Lung disease Mother   . Hypertension Mother   . Lung disease Father   . Alcoholism Father     ROS: Review of Systems  Constitutional: Positive for malaise/fatigue. Negative for weight loss.  Gastrointestinal: Negative for nausea and vomiting.  Musculoskeletal:       Negative muscle weakness    PHYSICAL EXAM: Blood pressure 123/80, pulse 78, temperature 98.3 F (36.8 C), temperature source Oral, height 5\' 9"  (1.753 m), weight 279 lb (126.6 kg), SpO2 97 %. Body mass index is 41.2 kg/m. Physical Exam Vitals signs reviewed.  Constitutional:      Appearance: Normal appearance. He is obese.  Cardiovascular:     Rate and Rhythm: Normal rate.     Pulses: Normal pulses.  Pulmonary:     Effort: Pulmonary effort is normal.  Breath sounds: Normal breath sounds.  Musculoskeletal: Normal range of motion.  Skin:    General: Skin is warm and dry.  Neurological:     Mental Status: He is alert and oriented to person, place, and time.  Psychiatric:        Mood and Affect: Mood normal.        Behavior: Behavior normal.     RECENT LABS AND TESTS: BMET    Component Value Date/Time   NA 144 09/27/2018 0755   K 4.3 09/27/2018 0755   CL 101 09/27/2018 0755   CO2 25 09/27/2018 0755   GLUCOSE 98 09/27/2018 0755   GLUCOSE 94 04/02/2018 1550   BUN 28 (H) 09/27/2018 0755   CREATININE 1.50 (H) 09/27/2018 0755   CREATININE 1.14 04/02/2018 1550   CALCIUM 9.7 09/27/2018 0755   GFRNONAA 47 (L) 09/27/2018 0755   GFRNONAA 64 03/17/2018 0954   GFRAA 55 (L) 09/27/2018 0755   GFRAA 75 03/17/2018 0954   Lab Results  Component Value Date   HGBA1C 5.4 09/27/2018   HGBA1C 5.4 06/05/2016   Lab Results  Component Value Date   INSULIN 17.7 09/27/2018   CBC    Component Value Date/Time   WBC 5.7 09/27/2018 0755   WBC 10.1 04/05/2018 0929   RBC 4.95 09/27/2018 0755   RBC 5.33 04/05/2018 0929   HGB 15.0 09/27/2018 0755   HCT 44.4 09/27/2018  0755   PLT 301 04/05/2018 0929   MCV 90 09/27/2018 0755   MCH 30.3 09/27/2018 0755   MCH 28.5 04/05/2018 0929   MCHC 33.8 09/27/2018 0755   MCHC 33.6 04/05/2018 0929   RDW 11.9 09/27/2018 0755   LYMPHSABS 1.3 09/27/2018 0755   EOSABS 0.1 09/27/2018 0755   BASOSABS 0.0 09/27/2018 0755   Iron/TIBC/Ferritin/ %Sat No results found for: IRON, TIBC, FERRITIN, IRONPCTSAT Lipid Panel     Component Value Date/Time   CHOL 113 09/27/2018 0755   TRIG 95 09/27/2018 0755   HDL 43 09/27/2018 0755   CHOLHDL 3.0 03/17/2018 0954   VLDL 19 06/05/2016 0827   LDLCALC 51 09/27/2018 0755   LDLCALC 62 03/17/2018 0954   Hepatic Function Panel     Component Value Date/Time   PROT 6.8 09/27/2018 0755   ALBUMIN 4.5 09/27/2018 0755   AST 17 09/27/2018 0755   ALT 17 09/27/2018 0755   ALKPHOS 61 09/27/2018 0755   BILITOT 0.7 09/27/2018 0755      Component Value Date/Time   TSH 1.040 09/27/2018 0755   TSH 1.18 06/05/2016 0827      OBESITY BEHAVIORAL INTERVENTION VISIT  Today's visit was # 2   Starting weight: 277 lbs Starting date: 09/23/2018 Today's weight : 279 lbs  Today's date: 10/07/2018 Total lbs lost to date: 0 At least 15 minutes were spent on discussing the following behavioral intervention visit.   ASK: We discussed the diagnosis of obesity with [name] today and Evelio agreed to give [name] permission to discuss obesity behavioral modification therapy today.  ASSESS: Wendell has the diagnosis of obesity and his BMI today is 41.18 Willson is in the action stage of change   ADVISE: Tyaire was educated on the multiple health risks of obesity as well as the benefit of weight loss to improve his health. He was advised of the need for long term treatment and the importance of lifestyle modifications to improve his current health and to decrease his risk of future health problems.  AGREE: Multiple dietary modification options and treatment options  were discussed and  Aurther agreed  to follow the recommendations documented in the above note.  ARRANGE: Maxwel was educated on the importance of frequent visits to treat obesity as outlined per CMS and USPSTF guidelines and agreed to schedule his next follow up appointment today.  I, Trixie Dredge, am acting as transcriptionist for Dennard Nip, MD  I have reviewed the above documentation for accuracy and completeness, and I agree with the above. -Dennard Nip, MD

## 2018-10-22 ENCOUNTER — Other Ambulatory Visit: Payer: Self-pay | Admitting: Family Medicine

## 2018-10-22 ENCOUNTER — Encounter: Payer: Self-pay | Admitting: Family Medicine

## 2018-10-22 NOTE — Telephone Encounter (Signed)
Requesting RF on Tramadol 50 mg 1 tab PO BID for chronic pain   I do not see where you have written this  RX pended, please review and send if appropriate

## 2018-10-25 ENCOUNTER — Other Ambulatory Visit: Payer: Self-pay | Admitting: Family Medicine

## 2018-10-25 NOTE — Telephone Encounter (Signed)
Tramadol refilled.   PDMP reviewed during this encounter.  

## 2018-10-26 LAB — COMPLETE METABOLIC PANEL WITH GFR
AG RATIO: 1.7 (calc) (ref 1.0–2.5)
ALT: 17 U/L (ref 9–46)
AST: 18 U/L (ref 10–35)
Albumin: 4.2 g/dL (ref 3.6–5.1)
Alkaline phosphatase (APISO): 51 U/L (ref 35–144)
BUN/Creatinine Ratio: 17 (calc) (ref 6–22)
BUN: 23 mg/dL (ref 7–25)
CALCIUM: 9.7 mg/dL (ref 8.6–10.3)
CHLORIDE: 107 mmol/L (ref 98–110)
CO2: 29 mmol/L (ref 20–32)
Creat: 1.37 mg/dL — ABNORMAL HIGH (ref 0.70–1.25)
GFR, EST AFRICAN AMERICAN: 61 mL/min/{1.73_m2} (ref >=60)
GFR, EST NON AFRICAN AMERICAN: 52 mL/min/{1.73_m2} — AB (ref >=60)
GLUCOSE: 105 mg/dL — AB (ref 65–99)
Globulin: 2.5 g/dL (calc) (ref 1.9–3.7)
POTASSIUM: 3.7 mmol/L (ref 3.5–5.3)
Sodium: 145 mmol/L (ref 135–146)
TOTAL PROTEIN: 6.7 g/dL (ref 6.1–8.1)
Total Bilirubin: 0.7 mg/dL (ref 0.2–1.2)

## 2018-10-27 ENCOUNTER — Telehealth: Payer: Self-pay | Admitting: Family Medicine

## 2018-10-27 MED ORDER — ALUMINUM-MAGNESIUM-SIMETHICONE 200-200-20 MG/5ML PO SUSP
30.00 | ORAL | Status: DC
Start: ? — End: 2018-10-27

## 2018-10-27 MED ORDER — TAMSULOSIN HCL 0.4 MG PO CAPS
0.40 | ORAL_CAPSULE | ORAL | Status: DC
Start: 2018-10-27 — End: 2018-10-27

## 2018-10-27 MED ORDER — TRAMADOL HCL 50 MG PO TABS
50.00 | ORAL_TABLET | ORAL | Status: DC
Start: ? — End: 2018-10-27

## 2018-10-27 MED ORDER — ATORVASTATIN CALCIUM 40 MG PO TABS: 40.0000 mg | ORAL_TABLET | Freq: Every day | ORAL | 3 refills | Status: DC

## 2018-10-27 MED ORDER — OXYCODONE HCL 5 MG PO TABS
10.00 | ORAL_TABLET | ORAL | Status: DC
Start: ? — End: 2018-10-27

## 2018-10-27 MED ORDER — ONDANSETRON 4 MG PO TBDP
4.00 | ORAL_TABLET | ORAL | Status: DC
Start: ? — End: 2018-10-27

## 2018-10-27 MED ORDER — DIPHENHYDRAMINE HCL 12.5 MG/5ML PO ELIX
12.50 | ORAL_SOLUTION | ORAL | Status: DC
Start: ? — End: 2018-10-27

## 2018-10-27 MED ORDER — BISACODYL 10 MG RE SUPP
10.00 | RECTAL | Status: DC
Start: ? — End: 2018-10-27

## 2018-10-27 MED ORDER — ENEMA 7-19 GM/118ML RE ENEM
1.00 | ENEMA | RECTAL | Status: DC
Start: ? — End: 2018-10-27

## 2018-10-27 MED ORDER — NALOXONE HCL 0.4 MG/ML IJ SOLN
.40 | INTRAMUSCULAR | Status: DC
Start: ? — End: 2018-10-27

## 2018-10-27 MED ORDER — SENNOSIDES-DOCUSATE SODIUM 8.6-50 MG PO TABS
1.00 | ORAL_TABLET | ORAL | Status: DC
Start: 2018-10-27 — End: 2018-10-27

## 2018-10-27 MED ORDER — NALOXONE HCL 0.4 MG/ML IJ SOLN
.10 | INTRAMUSCULAR | Status: DC
Start: ? — End: 2018-10-27

## 2018-10-27 MED ORDER — SODIUM CHLORIDE FLUSH 0.9 % IV SOLN
10.00 | INTRAVENOUS | Status: DC
Start: 2018-10-27 — End: 2018-10-27

## 2018-10-27 MED ORDER — ACETAMINOPHEN 500 MG PO TABS
1000.00 | ORAL_TABLET | ORAL | Status: DC
Start: 2018-10-27 — End: 2018-10-27

## 2018-10-27 MED ORDER — OXYCODONE HCL 5 MG PO TABS
5.00 | ORAL_TABLET | ORAL | Status: DC
Start: ? — End: 2018-10-27

## 2018-10-27 MED ORDER — AMLODIPINE BESYLATE 5 MG PO TABS
10.00 | ORAL_TABLET | ORAL | Status: DC
Start: 2018-10-28 — End: 2018-10-27

## 2018-10-27 MED ORDER — SODIUM CHLORIDE FLUSH 0.9 % IV SOLN
10.00 | INTRAVENOUS | Status: DC
Start: ? — End: 2018-10-27

## 2018-10-27 MED ORDER — LISINOPRIL 20 MG PO TABS
20.00 | ORAL_TABLET | ORAL | Status: DC
Start: 2018-10-28 — End: 2018-10-27

## 2018-10-27 MED ORDER — ASPIRIN EC 325 MG PO TBEC
325.00 | DELAYED_RELEASE_TABLET | ORAL | Status: DC
Start: 2018-10-27 — End: 2018-10-27

## 2018-10-27 MED ORDER — ONDANSETRON HCL 4 MG/2ML IJ SOLN
4.00 | INTRAMUSCULAR | Status: DC
Start: ? — End: 2018-10-27

## 2018-10-27 MED ORDER — GABAPENTIN 300 MG PO CAPS
300.00 | ORAL_CAPSULE | ORAL | Status: DC
Start: 2018-10-27 — End: 2018-10-27

## 2018-10-27 MED ORDER — LACTATED RINGERS IV SOLN
100.00 | INTRAVENOUS | Status: DC
Start: ? — End: 2018-10-27

## 2018-10-27 MED ORDER — DIPHENHYDRAMINE HCL 50 MG/ML IJ SOLN
12.50 | INTRAMUSCULAR | Status: DC
Start: ? — End: 2018-10-27

## 2018-10-27 MED ORDER — ATORVASTATIN CALCIUM 10 MG PO TABS
20.00 | ORAL_TABLET | ORAL | Status: DC
Start: 2018-10-27 — End: 2018-10-27

## 2018-10-27 MED ORDER — HYDROCHLOROTHIAZIDE 25 MG PO TABS
25.00 | ORAL_TABLET | ORAL | Status: DC
Start: 2018-10-28 — End: 2018-10-27

## 2018-10-27 NOTE — Telephone Encounter (Signed)
I received a letter from your pharmacy benefit insurance company.  They noticed that there is a small chance for a drug drug interaction between simvastatin and amlodipine.  This is not very likely but it is a potential risk.  It is reasonable to switch from simvastatin to atorvastatin.  Atorvastatin generally works better than simvastatin with fewer side effects and fewer risks.  It is a generic medication and I would like to switch you from simvastatin to atorvastatin.  Would you like me to send a prescription into the pharmacy?

## 2018-10-27 NOTE — Telephone Encounter (Signed)
I have sent a prescription for atorvastatin to the [facility].  This should replace simvastatin and be effectively equivalent.  Please let me know if you have any problems with it.  Ellard Artis

## 2018-10-29 NOTE — Telephone Encounter (Signed)
Patient has been advised. Makila Colombe,CMA  

## 2018-11-01 ENCOUNTER — Ambulatory Visit (INDEPENDENT_AMBULATORY_CARE_PROVIDER_SITE_OTHER): Payer: Medicare Other | Admitting: Family Medicine

## 2018-11-01 ENCOUNTER — Encounter (INDEPENDENT_AMBULATORY_CARE_PROVIDER_SITE_OTHER): Payer: Self-pay | Admitting: Family Medicine

## 2018-11-01 VITALS — BP 123/70 | HR 80 | Temp 98.0°F | Ht 69.0 in | Wt 275.0 lb

## 2018-11-01 DIAGNOSIS — Z6841 Body Mass Index (BMI) 40.0 and over, adult: Secondary | ICD-10-CM

## 2018-11-01 DIAGNOSIS — E559 Vitamin D deficiency, unspecified: Secondary | ICD-10-CM | POA: Diagnosis not present

## 2018-11-01 NOTE — Progress Notes (Signed)
Office: [phone]  /  Fax: [phone]   HPI:   Chief Complaint: OBESITY John Morrow is here to discuss his progress with his obesity treatment plan. He is on the Category 3 plan and is following his eating plan approximately 75 % of the time. He states he is exercising 0 minutes 0 times per week. John Morrow had hip replacement surgery approximately 2 weeks ago and is doing well. He has lost weight since our last visit and feels well. He is still deviating from his plan, but less than previously.  His weight is 275 lb (124.7 kg) today and has had a weight loss of 4 pounds over a period of 3 weeks since his last visit. He has lost 2 lbs since starting treatment with [name].  Vitamin D deficiency John Morrow has a diagnosis of vitamin D deficiency. He is currently stable on vit D, but is not yet at goal. He denies nausea, vomiting, or muscle weakness.  ASSESSMENT AND PLAN:  Vitamin D deficiency - Plan: Vitamin D, Ergocalciferol, (DRISDOL) 1.25 MG (50000 UT) CAPS capsule  Class 3 severe obesity with serious comorbidity and body mass index (BMI) of 40.0 to 44.9 in adult, unspecified obesity type ([name])  PLAN:  Vitamin D Deficiency John Morrow was informed that low vitamin D levels contributes to fatigue and are associated with obesity, breast, and colon cancer. John Morrow agrees to continue to take prescription Vit D @50 ,000 IU every week #4 with no refills and will follow up for routine testing of vitamin D, at least 2-3 times per year. He was informed of the risk of over-replacement of vitamin D and agrees to not increase her dose unless she discusses this with [name] first. John Morrow agrees to follow up in 3 to 4 weeks as directed.  I spent > than 50% of the 25 minute visit on counseling as documented in the note.  Obesity John Morrow is currently in the action stage of change. As such, his goal is to continue with weight loss efforts. He has agreed to change to keep a food journal with 1400 to 1600 calories and 85+  grams of protein.  John Morrow has been instructed to work up to a goal of 150 minutes of combined cardio and strengthening exercise per week for weight loss and overall health benefits. We discussed the following Behavioral Modification Strategies today: increasing lean protein intake, decreasing simple carbohydrates, and work on meal planning and easy cooking plans.  John Morrow has agreed to follow up with our clinic in 3 to 4 weeks. He was informed of the importance of frequent follow up visits to maximize his success with intensive lifestyle modifications for his multiple health conditions.  ALLERGIES: Allergies  Allergen Reactions  . Morphine And Related     Rash, vomiting, itching     MEDICATIONS: Current Outpatient Medications on File Prior to Visit  Medication Sig Dispense Refill  . amLODipine (NORVASC) 10 MG tablet TAKE 1 TABLET BY MOUTH ONCE DAILY 90 tablet 1  . atorvastatin (LIPITOR) 40 MG tablet Take 1 tablet (40 mg total) by mouth daily. 90 tablet 3  . buPROPion (WELLBUTRIN XL) 300 MG 24 hr tablet Take 1 tablet (300 mg total) by mouth daily. 90 tablet 0  . diclofenac sodium (VOLTAREN) 1 % GEL Apply 4 g topically 4 (four) times daily. To affected joint. 100 g 11  . lisinopril-hydrochlorothiazide (PRINZIDE,ZESTORETIC) 20-25 MG tablet TAKE 1 TABLET BY MOUTH ONCE DAILY 90 tablet 3  . omeprazole (PRILOSEC) 40 MG capsule Take 1 capsule (40 mg  total) by mouth daily. 30 capsule 3  . polyethylene glycol powder (GLYCOLAX/MIRALAX) powder Take 17 g by mouth daily. 850 g 3  . tamsulosin (FLOMAX) 0.4 MG CAPS capsule Take 1 capsule (0.4 mg total) by mouth daily. 90 capsule 3  . traMADol (ULTRAM) 50 MG tablet TAKE 1 TABLET BY MOUTH TWICE DAILY FOR  CHRONIC  PAIN 60 tablet 0  . Vitamin D, Ergocalciferol, (DRISDOL) 1.25 MG (50000 UT) CAPS capsule Take 1 capsule (50,000 Units total) by mouth every 7 (seven) days. 4 capsule 0   No current facility-administered medications on file prior to visit.      PAST MEDICAL HISTORY: Past Medical History:  Diagnosis Date  . Back pain   . Diverticulosis of colon without hemorrhage 06/12/2016   On Colonoscopy 11/06/2010  . HLD (hyperlipidemia) 05/29/2016  . Hyperlipemia   . Hypertension   . Joint pain   . Morbid obesity ([place]) 05/29/2016  . Prediabetes   . Right hip pain    needs hip replacement  . Sleep apnea     PAST SURGICAL HISTORY: Past Surgical History:  Procedure Laterality Date  . KNEE SURGERY      SOCIAL HISTORY: Social History   Tobacco Use  . Smoking status: Never Smoker  . Smokeless tobacco: Never Used  Substance Use Topics  . Alcohol use: Yes    Comment: occassionally  . Drug use: No    FAMILY HISTORY: Family History  Problem Relation Age of Onset  . Lung disease Mother   . Hypertension Mother   . Lung disease Father   . Alcoholism Father     ROS: Review of Systems  Constitutional: Positive for weight loss.  Gastrointestinal: Negative for nausea and vomiting.  Musculoskeletal:       Negative for muscle weakness.    PHYSICAL EXAM: Blood pressure 123/70, pulse 80, temperature 98 F (36.7 C), temperature source Oral, height 5\' 9"  (1.753 m), weight 275 lb (124.7 kg), SpO2 96 %. Body mass index is 40.61 kg/m. Physical Exam Vitals signs reviewed.  Constitutional:      Appearance: Normal appearance. He is obese.  Cardiovascular:     Rate and Rhythm: Normal rate.  Pulmonary:     Effort: Pulmonary effort is normal.  Musculoskeletal: Normal range of motion.  Skin:    General: Skin is warm and dry.  Neurological:     Mental Status: He is alert and oriented to person, place, and time.  Psychiatric:        Mood and Affect: Mood normal.        Behavior: Behavior normal.     RECENT LABS AND TESTS: BMET    Component Value Date/Time   NA 145 10/25/2018 0759   NA 144 09/27/2018 0755   K 3.7 10/25/2018 0759   CL 107 10/25/2018 0759   CO2 29 10/25/2018 0759   GLUCOSE 105 (H) 10/25/2018 0759   BUN  23 10/25/2018 0759   BUN 28 (H) 09/27/2018 0755   CREATININE 1.37 (H) 10/25/2018 0759   CALCIUM 9.7 10/25/2018 0759   GFRNONAA 52 (L) 10/25/2018 0759   GFRAA 61 10/25/2018 0759   Lab Results  Component Value Date   HGBA1C 5.4 09/27/2018   HGBA1C 5.4 06/05/2016   Lab Results  Component Value Date   INSULIN 17.7 09/27/2018   CBC    Component Value Date/Time   WBC 5.7 09/27/2018 0755   WBC 10.1 04/05/2018 0929   RBC 4.95 09/27/2018 0755   RBC 5.33 04/05/2018 0929  HGB 15.0 09/27/2018 0755   HCT 44.4 09/27/2018 0755   PLT 301 04/05/2018 0929   MCV 90 09/27/2018 0755   MCH 30.3 09/27/2018 0755   MCH 28.5 04/05/2018 0929   MCHC 33.8 09/27/2018 0755   MCHC 33.6 04/05/2018 0929   RDW 11.9 09/27/2018 0755   LYMPHSABS 1.3 09/27/2018 0755   EOSABS 0.1 09/27/2018 0755   BASOSABS 0.0 09/27/2018 0755   Iron/TIBC/Ferritin/ %Sat No results found for: IRON, TIBC, FERRITIN, IRONPCTSAT Lipid Panel     Component Value Date/Time   CHOL 113 09/27/2018 0755   TRIG 95 09/27/2018 0755   HDL 43 09/27/2018 0755   CHOLHDL 3.0 03/17/2018 0954   VLDL 19 06/05/2016 0827   LDLCALC 51 09/27/2018 0755   LDLCALC 62 03/17/2018 0954   Hepatic Function Panel     Component Value Date/Time   PROT 6.7 10/25/2018 0759   PROT 6.8 09/27/2018 0755   ALBUMIN 4.5 09/27/2018 0755   AST 18 10/25/2018 0759   ALT 17 10/25/2018 0759   ALKPHOS 61 09/27/2018 0755   BILITOT 0.7 10/25/2018 0759   BILITOT 0.7 09/27/2018 0755      Component Value Date/Time   TSH 1.040 09/27/2018 0755   TSH 1.18 06/05/2016 0827   Results for ODDIS, WESTLING "BOB" (MRN [id number]) as of 11/01/2018 16:46  Ref. Range 09/27/2018 07:55  Vitamin D, 25-Hydroxy Latest Ref Range: 30.0 - 100.0 ng/mL 33.7    OBESITY BEHAVIORAL INTERVENTION VISIT  Today's visit was # 3   Starting weight: 277 lbs Starting date: 09/23/18 Today's weight : Weight: 275 lb (124.7 kg)  Today's date: 11/01/2018 Total lbs lost to date: 2    Most Recent  Value 11/06/2013 - 11/05/2018  Height 5\' 9"  (1.753 m) 11/01/2018  Weight 275 lb (124.7 kg) 11/01/2018  BMI (Calculated) 40.59 11/01/2018  BLOOD PRESSURE - SYSTOLIC 993 03/30/9677  BLOOD PRESSURE - DIASTOLIC 70 9/38/1017  Waist Measurement  51 inches 09/23/2018   Body Fat % 40.5 % 11/01/2018  Total Body Water (lbs) 128.2 lbs 11/01/2018  RMR 2147 09/23/2018   ASK: We discussed the diagnosis of obesity with [name] today and Nysir agreed to give [name] permission to discuss obesity behavioral modification therapy today.  ASSESS: Nethaniel has the diagnosis of obesity and his BMI today is 40.5. Jeet is in the action stage of change.   ADVISE: Chandan was educated on the multiple health risks of obesity as well as the benefit of weight loss to improve his health. He was advised of the need for long term treatment and the importance of lifestyle modifications to improve his current health and to decrease his risk of future health problems.  AGREE: Multiple dietary modification options and treatment options were discussed and Toan agreed to follow the recommendations documented in the above note.  ARRANGE: Kadarius was educated on the importance of frequent visits to treat obesity as outlined per CMS and USPSTF guidelines and agreed to schedule his next follow up appointment today.  IMarcille Blanco, CMA, am acting as transcriptionist for Starlyn Skeans, MD  I have reviewed the above documentation for accuracy and completeness, and I agree with the above. -Dennard Nip, MD

## 2018-11-02 MED ORDER — VITAMIN D (ERGOCALCIFEROL) 1.25 MG (50000 UNIT) PO CAPS: 50000.0000 [IU] | ORAL_CAPSULE | ORAL | 0 refills | Status: DC

## 2018-11-09 ENCOUNTER — Other Ambulatory Visit: Payer: Self-pay | Admitting: Family Medicine

## 2018-11-24 ENCOUNTER — Other Ambulatory Visit: Payer: Self-pay

## 2018-11-24 ENCOUNTER — Ambulatory Visit (INDEPENDENT_AMBULATORY_CARE_PROVIDER_SITE_OTHER): Payer: Medicare Other | Admitting: Family Medicine

## 2018-11-24 ENCOUNTER — Encounter (INDEPENDENT_AMBULATORY_CARE_PROVIDER_SITE_OTHER): Payer: Self-pay | Admitting: Family Medicine

## 2018-11-24 VITALS — BP 94/62 | HR 88 | Ht 69.0 in | Wt 269.0 lb

## 2018-11-24 DIAGNOSIS — E559 Vitamin D deficiency, unspecified: Secondary | ICD-10-CM | POA: Diagnosis not present

## 2018-11-24 DIAGNOSIS — Z6839 Body mass index (BMI) 39.0-39.9, adult: Secondary | ICD-10-CM

## 2018-11-24 MED ORDER — VITAMIN D (ERGOCALCIFEROL) 1.25 MG (50000 UNIT) PO CAPS: 50000.0000 [IU] | ORAL_CAPSULE | ORAL | 0 refills | Status: DC

## 2018-11-24 NOTE — Progress Notes (Signed)
Office: [phone]  /  Fax: [phone]   HPI:   Chief Complaint: OBESITY John Morrow is here to discuss his progress with his obesity treatment plan. He is on the Category 3 plan and is following his eating plan approximately 80 % of the time. He states he is exercising 0 minutes 0 times per week. John Morrow is recovering from hip surgery, but doing well with weight loss. He isn't always eating all of his food and often cutting calories too low.  His weight is 269 lb (122 kg) today and has had a weight loss of 6 pounds over a period of 3 weeks since his last visit. He has lost 8 lbs since starting treatment with [name].  Vitamin D Deficiency John Morrow has a diagnosis of vitamin D deficiency. He is currently stable on vit D, but is not yet at goal. John Morrow denies nausea, vomiting, or muscle weakness.  ASSESSMENT AND PLAN:  Vitamin D deficiency - Plan: Vitamin D, Ergocalciferol, (DRISDOL) 1.25 MG (50000 UT) CAPS capsule  Class 2 severe obesity with serious comorbidity and body mass index (BMI) of 39.0 to 39.9 in adult, unspecified obesity type (John Morrow)  PLAN:  Vitamin D Deficiency John Morrow was informed that low vitamin D levels contributes to fatigue and are associated with obesity, breast, and colon cancer. John Morrow agrees to continue to take prescription Vit D @50 ,000 IU every week #4 with no refills and will follow up for routine testing of vitamin D, at least 2-3 times per year. He was informed of the risk of over-replacement of vitamin D and agrees to not increase her dose unless she discusses this with [name] first. John Morrow agrees to follow up in 4 weeks as directed.  Obesity John Morrow is currently in the action stage of change. As such, his goal is to continue with weight loss efforts. He has agreed to follow the Category 3 plan. John Morrow has been instructed to work up to a goal of 150 minutes of combined cardio and strengthening exercise per week for weight loss and overall health benefits. We discussed the  following Behavioral Modification Strategies today: increasing lean protein intake, work on meal planning and easy cooking plans, no skipping meals, better snacking choices, and dealing with family or coworker sabotage.  John Morrow has agreed to follow up with our clinic in 4 weeks. He was informed of the importance of frequent follow up visits to maximize his success with intensive lifestyle modifications for his multiple health conditions.  ALLERGIES: Allergies  Allergen Reactions  . Morphine And Related     Rash, vomiting, itching     MEDICATIONS: Current Outpatient Medications on File Prior to Visit  Medication Sig Dispense Refill  . amLODipine (NORVASC) 10 MG tablet TAKE 1 TABLET BY MOUTH ONCE DAILY 90 tablet 1  . atorvastatin (LIPITOR) 40 MG tablet Take 1 tablet (40 mg total) by mouth daily. 90 tablet 3  . buPROPion (WELLBUTRIN XL) 300 MG 24 hr tablet TAKE 1 TABLET BY MOUTH ONCE DAILY 90 tablet 0  . diclofenac sodium (VOLTAREN) 1 % GEL Apply 4 g topically 4 (four) times daily. To affected joint. 100 g 11  . lisinopril-hydrochlorothiazide (PRINZIDE,ZESTORETIC) 20-25 MG tablet TAKE 1 TABLET BY MOUTH ONCE DAILY 90 tablet 3  . omeprazole (PRILOSEC) 40 MG capsule Take 1 capsule (40 mg total) by mouth daily. 30 capsule 3  . polyethylene glycol powder (GLYCOLAX/MIRALAX) powder Take 17 g by mouth daily. 850 g 3  . tamsulosin (FLOMAX) 0.4 MG CAPS capsule Take 1 capsule (0.4 mg  total) by mouth daily. 90 capsule 3  . traMADol (ULTRAM) 50 MG tablet TAKE 1 TABLET BY MOUTH TWICE DAILY FOR  CHRONIC  PAIN 60 tablet 0   No current facility-administered medications on file prior to visit.     PAST MEDICAL HISTORY: Past Medical History:  Diagnosis Date  . Back pain   . Diverticulosis of colon without hemorrhage 06/12/2016   On Colonoscopy 11/06/2010  . HLD (hyperlipidemia) 05/29/2016  . Hyperlipemia   . Hypertension   . Joint pain   . Morbid obesity ([place]) 05/29/2016  . Prediabetes   . Right hip  pain    needs hip replacement  . Sleep apnea     PAST SURGICAL HISTORY: Past Surgical History:  Procedure Laterality Date  . KNEE SURGERY      SOCIAL HISTORY: Social History   Tobacco Use  . Smoking status: Never Smoker  . Smokeless tobacco: Never Used  Substance Use Topics  . Alcohol use: Yes    Comment: occassionally  . Drug use: No    FAMILY HISTORY: Family History  Problem Relation Age of Onset  . Lung disease Mother   . Hypertension Mother   . Lung disease Father   . Alcoholism Father    ROS: Review of Systems  Constitutional: Positive for weight loss.  Gastrointestinal: Negative for nausea and vomiting.  Musculoskeletal:       Negative for muscle weakness.   PHYSICAL EXAM: Blood pressure 94/62, pulse 88, height 5\' 9"  (1.753 m), weight 269 lb (122 kg), SpO2 96 %. Body mass index is 39.72 kg/m. Physical Exam Vitals signs reviewed.  Constitutional:      Appearance: Normal appearance. He is obese.  Cardiovascular:     Rate and Rhythm: Normal rate.  Pulmonary:     Effort: Pulmonary effort is normal.  Musculoskeletal: Normal range of motion.  Skin:    General: Skin is warm and dry.  Neurological:     Mental Status: He is alert and oriented to person, place, and time.  Psychiatric:        Mood and Affect: Mood normal.        Behavior: Behavior normal.    RECENT LABS AND TESTS: BMET    Component Value Date/Time   NA 145 10/25/2018 0759   NA 144 09/27/2018 0755   K 3.7 10/25/2018 0759   CL 107 10/25/2018 0759   CO2 29 10/25/2018 0759   GLUCOSE 105 (H) 10/25/2018 0759   BUN 23 10/25/2018 0759   BUN 28 (H) 09/27/2018 0755   CREATININE 1.37 (H) 10/25/2018 0759   CALCIUM 9.7 10/25/2018 0759   GFRNONAA 52 (L) 10/25/2018 0759   GFRAA 61 10/25/2018 0759   Lab Results  Component Value Date   HGBA1C 5.4 09/27/2018   HGBA1C 5.4 06/05/2016   Lab Results  Component Value Date   INSULIN 17.7 09/27/2018   CBC    Component Value Date/Time   WBC  5.7 09/27/2018 0755   WBC 10.1 04/05/2018 0929   RBC 4.95 09/27/2018 0755   RBC 5.33 04/05/2018 0929   HGB 15.0 09/27/2018 0755   HCT 44.4 09/27/2018 0755   PLT 301 04/05/2018 0929   MCV 90 09/27/2018 0755   MCH 30.3 09/27/2018 0755   MCH 28.5 04/05/2018 0929   MCHC 33.8 09/27/2018 0755   MCHC 33.6 04/05/2018 0929   RDW 11.9 09/27/2018 0755   LYMPHSABS 1.3 09/27/2018 0755   EOSABS 0.1 09/27/2018 0755   BASOSABS 0.0 09/27/2018 0755   Iron/TIBC/Ferritin/ %  Sat No results found for: IRON, TIBC, FERRITIN, IRONPCTSAT Lipid Panel     Component Value Date/Time   CHOL 113 09/27/2018 0755   TRIG 95 09/27/2018 0755   HDL 43 09/27/2018 0755   CHOLHDL 3.0 03/17/2018 0954   VLDL 19 06/05/2016 0827   LDLCALC 51 09/27/2018 0755   LDLCALC 62 03/17/2018 0954   Hepatic Function Panel     Component Value Date/Time   PROT 6.7 10/25/2018 0759   PROT 6.8 09/27/2018 0755   ALBUMIN 4.5 09/27/2018 0755   AST 18 10/25/2018 0759   ALT 17 10/25/2018 0759   ALKPHOS 61 09/27/2018 0755   BILITOT 0.7 10/25/2018 0759   BILITOT 0.7 09/27/2018 0755      Component Value Date/Time   TSH 1.040 09/27/2018 0755   TSH 1.18 06/05/2016 0827   Results for John Morrow, John "John Morrow" (MRN [id number]) as of 11/24/2018 13:39  Ref. Range 09/27/2018 07:55  Vitamin D, 25-Hydroxy Latest Ref Range: 30.0 - 100.0 ng/mL 33.7   OBESITY BEHAVIORAL INTERVENTION VISIT  Today's visit was # 4   Starting weight: 277 lbs Starting date: 09/23/18 Today's weight : Weight: 269 lb (122 kg)  Today's date: 11/24/2018 Total lbs lost to date: 8 At least 15 minutes were spent on discussing the following behavioral intervention visit.    11/24/2018  Height 5\' 9"  (1.753 m)  Weight 269 lb (122 kg)  BMI (Calculated) 39.71  BLOOD PRESSURE - SYSTOLIC 94  BLOOD PRESSURE - DIASTOLIC 62   Body Fat % 55.7 %   ASK: We discussed the diagnosis of obesity with John Morrow today and John Morrow agreed to give [name] permission to discuss obesity  behavioral modification therapy today.  ASSESS: John Morrow has the diagnosis of obesity and his BMI today is 39.71. Binh is in the action stage of change.   ADVISE: Kinsler was educated on the multiple health risks of obesity as well as the benefit of weight loss to improve his health. He was advised of the need for long term treatment and the importance of lifestyle modifications to improve his current health and to decrease his risk of future health problems.  AGREE: Multiple dietary modification options and treatment options were discussed and Alfonso agreed to follow the recommendations documented in the above note.  ARRANGE: Desmin was educated on the importance of frequent visits to treat obesity as outlined per CMS and USPSTF guidelines and agreed to schedule his next follow up appointment today.  IMarcille Blanco, CMA, am acting as transcriptionist for Starlyn Skeans, MD  I have reviewed the above documentation for accuracy and completeness, and I agree with the above. -Dennard Nip, MD

## 2018-11-26 ENCOUNTER — Other Ambulatory Visit: Payer: Self-pay | Admitting: Family Medicine

## 2018-11-29 NOTE — Telephone Encounter (Signed)
Will forward to provider for review.

## 2018-12-01 ENCOUNTER — Encounter (INDEPENDENT_AMBULATORY_CARE_PROVIDER_SITE_OTHER): Payer: Self-pay | Admitting: Family Medicine

## 2018-12-02 ENCOUNTER — Encounter (INDEPENDENT_AMBULATORY_CARE_PROVIDER_SITE_OTHER): Payer: Medicare Other | Admitting: Family Medicine

## 2018-12-02 DIAGNOSIS — N189 Chronic kidney disease, unspecified: Secondary | ICD-10-CM

## 2018-12-02 DIAGNOSIS — M7989 Other specified soft tissue disorders: Secondary | ICD-10-CM

## 2018-12-02 MED ORDER — FUROSEMIDE 20 MG PO TABS: 20.0000 mg | ORAL_TABLET | Freq: Every day | ORAL | 0 refills | Status: DC

## 2018-12-02 NOTE — Telephone Encounter (Signed)
       John Morrow is a 68 y.o. male who sent a message via MyChart inquiring about leg swelling.  John Morrow has a history of leg swelling and mild chronic renal insufficiency.  In the past he is done well with Lasix and is interested in restarting it.  He feels well otherwise.  I called and spoke with John Morrow.  He is feeling well he denies any shortness of breath chest pain or palpitation.  He is actively losing weight intentionally and is happy with how things are going.  ROS as above:  Exam:  Wt Readings from Last 5 Encounters:  11/24/18 269 lb (122 kg)  11/01/18 275 lb (124.7 kg)  10/07/18 279 lb (126.6 kg)  09/28/18 277 lb (125.6 kg)  09/23/18 277 lb (125.6 kg)    Lab and Radiology Results Lab Results  Component Value Date   CREATININE 1.37 (H) 10/25/2018     Assessment and Plan: 68 y.o. male with  Leg swelling. Plan to restart Lasix at 20 mg daily.  Will order and obtain renal function panel in about 1 week. I have asked patient to submit daily weights and blood pressures. We will try to manage this over the phone if possible to avoid unnecessary exposure to doctors offices to minimize risk of COVID-19.  PDMP not reviewed this encounter. Orders Placed This Encounter  Procedures  . Renal Function Panel   Meds ordered this encounter  Medications  . furosemide (LASIX) 20 MG tablet    Sig: Take 1 tablet (20 mg total) by mouth daily.    Dispense:  30 tablet    Refill:  0    Cumulative time spent on this encounter is >11 minutes

## 2018-12-08 ENCOUNTER — Encounter (INDEPENDENT_AMBULATORY_CARE_PROVIDER_SITE_OTHER): Payer: Self-pay

## 2018-12-12 ENCOUNTER — Other Ambulatory Visit: Payer: Self-pay | Admitting: Family Medicine

## 2018-12-12 DIAGNOSIS — I10 Essential (primary) hypertension: Secondary | ICD-10-CM

## 2018-12-16 ENCOUNTER — Other Ambulatory Visit: Payer: Self-pay

## 2018-12-16 ENCOUNTER — Ambulatory Visit (INDEPENDENT_AMBULATORY_CARE_PROVIDER_SITE_OTHER): Payer: Medicare Other | Admitting: Family Medicine

## 2018-12-16 ENCOUNTER — Encounter (INDEPENDENT_AMBULATORY_CARE_PROVIDER_SITE_OTHER): Payer: Self-pay | Admitting: Family Medicine

## 2018-12-16 DIAGNOSIS — K219 Gastro-esophageal reflux disease without esophagitis: Secondary | ICD-10-CM | POA: Diagnosis not present

## 2018-12-16 DIAGNOSIS — Z6839 Body mass index (BMI) 39.0-39.9, adult: Secondary | ICD-10-CM | POA: Diagnosis not present

## 2018-12-16 DIAGNOSIS — E559 Vitamin D deficiency, unspecified: Secondary | ICD-10-CM | POA: Diagnosis not present

## 2018-12-16 MED ORDER — VITAMIN D (ERGOCALCIFEROL) 1.25 MG (50000 UNIT) PO CAPS: 50000.0000 [IU] | ORAL_CAPSULE | ORAL | 0 refills | Status: DC

## 2018-12-16 NOTE — Progress Notes (Signed)
Office: [phone]  /  Fax: [phone] TeleHealth Visit:  John Morrow has verbally consented to this TeleHealth visit today. The patient is located at home, the provider is located at the [facility] office. The participants in this visit include the listed provider, patient, and his wife John Morrow. The visit was conducted today via Skype.  HPI:   Chief Complaint: OBESITY John Morrow is here to discuss his progress with his obesity treatment plan. He is on the Category 3 plan and is following his eating plan approximately 80 to 90 % of the time. He states he is walking 30 minutes 7 times per week. Early is doing well with weight loss on his Category 3 and thinks that he has lost another 2 pounds since his last visit. Her hunger is controlled and he is mindful about boredom eating.  We were unable to weigh the patient today for this TeleHealth visit. He feels as if he has lost weight since his last visit. He has lost 8 lbs since starting treatment with [name].  Vitamin D Deficiency John Morrow has a diagnosis of vitamin D deficiency. He is currently stable on vit D, but is not yet at goal. John Morrow denies nausea, vomiting, or muscle weakness.  Gastroesophageal Reflux Disease John Morrow notes that his symptoms have completely resolved on Prilosec, his diet, and his weight loss.  ASSESSMENT AND PLAN:  Vitamin D deficiency - Plan: Vitamin D, Ergocalciferol, (DRISDOL) 1.25 MG (50000 UT) CAPS capsule  Gastroesophageal reflux disease, esophagitis presence not specified  Class 2 severe obesity with serious comorbidity and body mass index (BMI) of 39.0 to 39.9 in adult, unspecified obesity type ([place])  PLAN:  Vitamin D Deficiency John Morrow was informed that low vitamin D levels contribute to fatigue and are associated with obesity, breast, and colon cancer. John Morrow agrees to continue to take prescription Vit D @50 ,000 IU every week #4 with no refills and will follow up for routine testing of vitamin  D, at least 2-3 times per year. He was informed of the risk of over-replacement of vitamin D and agrees to not increase his dose unless he discusses this with [name] first. John Morrow agrees to follow up in 2 weeks as directed.  Gastroesophageal Reflux Disease John Morrow is going to start taking Prilosec every other day and continues with his diet and weight loss to see if he can taper off of Prilosec all together.  Obesity John Morrow is currently in the action stage of change. As such, his goal is to continue with weight loss efforts.  He has agreed to follow the Category 3 plan. John Morrow has been instructed to work up to a goal of 150 minutes of combined cardio and strengthening exercise per week for weight loss and overall health benefits. We discussed the following Behavioral Modification Strategies today: increasing lean protein intake, decreasing simple carbohydrates, work on meal planning and easy cooking plans, no skipping meals, and ways to avoid boredom eating.  John Morrow has agreed to follow up with our clinic in 2 weeks. He was informed of the importance of frequent follow up visits to maximize his success with intensive lifestyle modifications for his multiple health conditions.  ALLERGIES: Allergies  Allergen Reactions   Morphine And Related     Rash, vomiting, itching     MEDICATIONS: Current Outpatient Medications on File Prior to Visit  Medication Sig Dispense Refill   amLODipine (NORVASC) 10 MG tablet Take 1 tablet by mouth once daily 90 tablet 0   atorvastatin (LIPITOR) 40 MG tablet  Take 1 tablet (40 mg total) by mouth daily. 90 tablet 3   buPROPion (WELLBUTRIN XL) 300 MG 24 hr tablet TAKE 1 TABLET BY MOUTH ONCE DAILY 90 tablet 0   diclofenac sodium (VOLTAREN) 1 % GEL Apply 4 g topically 4 (four) times daily. To affected joint. 100 g 11   furosemide (LASIX) 20 MG tablet Take 1 tablet (20 mg total) by mouth daily. 30 tablet 0   lisinopril-hydrochlorothiazide (PRINZIDE,ZESTORETIC)  20-25 MG tablet TAKE 1 TABLET BY MOUTH ONCE DAILY 90 tablet 3   omeprazole (PRILOSEC) 40 MG capsule Take 1 capsule (40 mg total) by mouth daily. 30 capsule 3   polyethylene glycol powder (GLYCOLAX/MIRALAX) powder Take 17 g by mouth daily. 850 g 3   tamsulosin (FLOMAX) 0.4 MG CAPS capsule Take 1 capsule (0.4 mg total) by mouth daily. 90 capsule 3   traMADol (ULTRAM) 50 MG tablet TAKE 1 TABLET BY MOUTH TWICE DAILY FOR  CHRONIC  PAIN 60 tablet 0   No current facility-administered medications on file prior to visit.     PAST MEDICAL HISTORY: Past Medical History:  Diagnosis Date   Back pain    Diverticulosis of colon without hemorrhage 06/12/2016   On Colonoscopy 11/06/2010   HLD (hyperlipidemia) 05/29/2016   Hyperlipemia    Hypertension    Joint pain    Morbid obesity ([place]) 05/29/2016   Prediabetes    Right hip pain    needs hip replacement   Sleep apnea     PAST SURGICAL HISTORY: Past Surgical History:  Procedure Laterality Date   KNEE SURGERY      SOCIAL HISTORY: Social History   Tobacco Use   Smoking status: Never Smoker   Smokeless tobacco: Never Used  Substance Use Topics   Alcohol use: Yes    Comment: occassionally   Drug use: No    FAMILY HISTORY: Family History  Problem Relation Age of Onset   Lung disease Mother    Hypertension Mother    Lung disease Father    Alcoholism Father     ROS: Review of Systems  Gastrointestinal: Negative for nausea and vomiting.  Musculoskeletal:       Negative for muscle weakness.    PHYSICAL EXAM: Pt in no acute distress  RECENT LABS AND TESTS: BMET    Component Value Date/Time   NA 145 10/25/2018 0759   NA 144 09/27/2018 0755   K 3.7 10/25/2018 0759   CL 107 10/25/2018 0759   CO2 29 10/25/2018 0759   GLUCOSE 105 (H) 10/25/2018 0759   BUN 23 10/25/2018 0759   BUN 28 (H) 09/27/2018 0755   CREATININE 1.37 (H) 10/25/2018 0759   CALCIUM 9.7 10/25/2018 0759   GFRNONAA 52 (L) 10/25/2018 0759    GFRAA 61 10/25/2018 0759   Lab Results  Component Value Date   HGBA1C 5.4 09/27/2018   HGBA1C 5.4 06/05/2016   Lab Results  Component Value Date   INSULIN 17.7 09/27/2018   CBC    Component Value Date/Time   WBC 5.7 09/27/2018 0755   WBC 10.1 04/05/2018 0929   RBC 4.95 09/27/2018 0755   RBC 5.33 04/05/2018 0929   HGB 15.0 09/27/2018 0755   HCT 44.4 09/27/2018 0755   PLT 301 04/05/2018 0929   MCV 90 09/27/2018 0755   MCH 30.3 09/27/2018 0755   MCH 28.5 04/05/2018 0929   MCHC 33.8 09/27/2018 0755   MCHC 33.6 04/05/2018 0929   RDW 11.9 09/27/2018 0755   LYMPHSABS 1.3 09/27/2018 0755  EOSABS 0.1 09/27/2018 0755   BASOSABS 0.0 09/27/2018 0755   Iron/TIBC/Ferritin/ %Sat No results found for: IRON, TIBC, FERRITIN, IRONPCTSAT Lipid Panel     Component Value Date/Time   CHOL 113 09/27/2018 0755   TRIG 95 09/27/2018 0755   HDL 43 09/27/2018 0755   CHOLHDL 3.0 03/17/2018 0954   VLDL 19 06/05/2016 0827   LDLCALC 51 09/27/2018 0755   LDLCALC 62 03/17/2018 0954   Hepatic Function Panel     Component Value Date/Time   PROT 6.7 10/25/2018 0759   PROT 6.8 09/27/2018 0755   ALBUMIN 4.5 09/27/2018 0755   AST 18 10/25/2018 0759   ALT 17 10/25/2018 0759   ALKPHOS 61 09/27/2018 0755   BILITOT 0.7 10/25/2018 0759   BILITOT 0.7 09/27/2018 0755      Component Value Date/Time   TSH 1.040 09/27/2018 0755   TSH 1.18 06/05/2016 0827   Results for KOLBE, DELMONACO "BOB" (MRN [id number]) as of 12/16/2018 15:13  Ref. Range 09/27/2018 07:55  Vitamin D, 25-Hydroxy Latest Ref Range: 30.0 - 100.0 ng/mL 33.7     I, Marcille Blanco, CMA, am acting as transcriptionist for Starlyn Skeans, MD I have reviewed the above documentation for accuracy and completeness, and I agree with the above. -Dennard Nip, MD

## 2018-12-22 LAB — RENAL FUNCTION PANEL
Albumin: 3.9 g/dL (ref 3.6–5.1)
BUN/Creatinine Ratio: 21 (calc) (ref 6–22)
BUN: 26 mg/dL — ABNORMAL HIGH (ref 7–25)
CO2: 32 mmol/L (ref 20–32)
CREATININE: 1.22 mg/dL (ref 0.70–1.25)
Calcium: 9.4 mg/dL (ref 8.6–10.3)
Chloride: 103 mmol/L (ref 98–110)
Glucose, Bld: 96 mg/dL (ref 65–99)
Phosphorus: 3.4 mg/dL (ref 2.1–4.3)
Potassium: 3.6 mmol/L (ref 3.5–5.3)
Sodium: 142 mmol/L (ref 135–146)

## 2018-12-30 ENCOUNTER — Other Ambulatory Visit: Payer: Self-pay

## 2018-12-30 ENCOUNTER — Encounter (INDEPENDENT_AMBULATORY_CARE_PROVIDER_SITE_OTHER): Payer: Self-pay | Admitting: Family Medicine

## 2018-12-30 ENCOUNTER — Ambulatory Visit (INDEPENDENT_AMBULATORY_CARE_PROVIDER_SITE_OTHER): Payer: Medicare Other | Admitting: Family Medicine

## 2018-12-30 DIAGNOSIS — Z6839 Body mass index (BMI) 39.0-39.9, adult: Secondary | ICD-10-CM | POA: Diagnosis not present

## 2018-12-30 DIAGNOSIS — E559 Vitamin D deficiency, unspecified: Secondary | ICD-10-CM

## 2018-12-30 MED ORDER — VITAMIN D (ERGOCALCIFEROL) 1.25 MG (50000 UNIT) PO CAPS: 50000.0000 [IU] | ORAL_CAPSULE | ORAL | 0 refills | Status: DC

## 2018-12-30 NOTE — Progress Notes (Signed)
Office: [phone]  /  Fax: [phone] TeleHealth Visit:  John Morrow has verbally consented to this TeleHealth visit today. The patient is located at home, the provider is located at the [facility] office. The participants in this visit include the listed provider and patient, spouse and any and all parties involved. The visit was conducted today via telephone. John Morrow was unable to use realtime audiovisual technology today and the telehealth visit was conducted via telephone.  HPI:   Chief Complaint: OBESITY John Morrow is here to discuss his progress with his obesity treatment plan. He is on the Category 3 plan and is following his eating plan approximately 80 to 90 % of the time. He states he is walking 30 minutes 4 times per week. John Morrow feels he is doing well with his eating plan. He feels he has lost another 2 pounds since his last visit. John Morrow is walking outside on good days and he is trying to stay active during the Covid-19 isolation. We were unable to weigh the patient today for this TeleHealth visit. He feels as if he has lost weight since his last visit. He has lost 10 lbs since starting treatment with [name].  Vitamin D deficiency John Morrow has a diagnosis of vitamin D deficiency. John Morrow is stable on vit D. He is not yet at goal. John Morrow denies nausea, vomiting or muscle weakness.  ASSESSMENT AND PLAN:  Vitamin D deficiency - Plan: Vitamin D, Ergocalciferol, (DRISDOL) 1.25 MG (50000 UT) CAPS capsule  Class 2 severe obesity with serious comorbidity and body mass index (BMI) of 39.0 to 39.9 in adult, unspecified obesity type (John Morrow)  PLAN:  Vitamin D Deficiency John Morrow was informed that low vitamin D levels contributes to fatigue and are associated with obesity, breast, and colon cancer. He agrees to continue to take prescription Vit D @50 ,000 IU every week #4 with no refills and will follow up for routine testing of vitamin D, at least 2-3 times per year. He was  informed of the risk of over-replacement of vitamin D and agrees to not increase his dose unless he discusses this with [name] first. John Morrow agrees to follow up as directed.  Obesity John Morrow is currently in the action stage of change. As such, his goal is to continue with weight loss efforts He has agreed to follow the Category 3 plan John Morrow has been instructed to work up to a goal of 150 minutes of combined cardio and strengthening exercise per week for weight loss and overall health benefits. We discussed the following Behavioral Modification Strategies today: better snacking choices, work on meal planning and easy cooking plans and ways to avoid night time snacking  John Morrow has agreed to follow up with our clinic in 2 to 3 weeks. He was informed of the importance of frequent follow up visits to maximize his success with intensive lifestyle modifications for his multiple health conditions.  ALLERGIES: Allergies  Allergen Reactions  . Morphine And Related     Rash, vomiting, itching     MEDICATIONS: Current Outpatient Medications on File Prior to Visit  Medication Sig Dispense Refill  . amLODipine (NORVASC) 10 MG tablet Take 1 tablet by mouth once daily 90 tablet 0  . atorvastatin (LIPITOR) 40 MG tablet Take 1 tablet (40 mg total) by mouth daily. 90 tablet 3  . buPROPion (WELLBUTRIN XL) 300 MG 24 hr tablet TAKE 1 TABLET BY MOUTH ONCE DAILY 90 tablet 0  . diclofenac sodium (VOLTAREN) 1 % GEL Apply 4 g topically 4 (  four) times daily. To affected joint. 100 g 11  . furosemide (LASIX) 20 MG tablet Take 1 tablet (20 mg total) by mouth daily. 30 tablet 0  . lisinopril-hydrochlorothiazide (PRINZIDE,ZESTORETIC) 20-25 MG tablet TAKE 1 TABLET BY MOUTH ONCE DAILY 90 tablet 3  . omeprazole (PRILOSEC) 40 MG capsule Take 1 capsule (40 mg total) by mouth daily. 30 capsule 3  . polyethylene glycol powder (GLYCOLAX/MIRALAX) powder Take 17 g by mouth daily. 850 g 3  . tamsulosin (FLOMAX) 0.4 MG CAPS capsule  Take 1 capsule (0.4 mg total) by mouth daily. 90 capsule 3  . traMADol (ULTRAM) 50 MG tablet TAKE 1 TABLET BY MOUTH TWICE DAILY FOR  CHRONIC  PAIN 60 tablet 0   No current facility-administered medications on file prior to visit.     PAST MEDICAL HISTORY: Past Medical History:  Diagnosis Date  . Back pain   . Diverticulosis of colon without hemorrhage 06/12/2016   On Colonoscopy 11/06/2010  . HLD (hyperlipidemia) 05/29/2016  . Hyperlipemia   . Hypertension   . Joint pain   . Morbid obesity ([place]) 05/29/2016  . Prediabetes   . Right hip pain    needs hip replacement  . Sleep apnea     PAST SURGICAL HISTORY: Past Surgical History:  Procedure Laterality Date  . KNEE SURGERY      SOCIAL HISTORY: Social History   Tobacco Use  . Smoking status: Never Smoker  . Smokeless tobacco: Never Used  Substance Use Topics  . Alcohol use: Yes    Comment: occassionally  . Drug use: No    FAMILY HISTORY: Family History  Problem Relation Age of Onset  . Lung disease Mother   . Hypertension Mother   . Lung disease Father   . Alcoholism Father     ROS: Review of Systems  Constitutional: Positive for weight loss.  Gastrointestinal: Negative for nausea and vomiting.  Musculoskeletal:       Negative for muscle weakness    PHYSICAL EXAM: Pt in no acute distress  RECENT LABS AND TESTS: BMET    Component Value Date/Time   NA 142 12/21/2018 0744   NA 144 09/27/2018 0755   K 3.6 12/21/2018 0744   CL 103 12/21/2018 0744   CO2 32 12/21/2018 0744   GLUCOSE 96 12/21/2018 0744   BUN 26 (H) 12/21/2018 0744   BUN 28 (H) 09/27/2018 0755   CREATININE 1.22 12/21/2018 0744   CALCIUM 9.4 12/21/2018 0744   GFRNONAA 52 (L) 10/25/2018 0759   GFRAA 61 10/25/2018 0759   Lab Results  Component Value Date   HGBA1C 5.4 09/27/2018   HGBA1C 5.4 06/05/2016   Lab Results  Component Value Date   INSULIN 17.7 09/27/2018   CBC    Component Value Date/Time   WBC 5.7 09/27/2018 0755   WBC  10.1 04/05/2018 0929   RBC 4.95 09/27/2018 0755   RBC 5.33 04/05/2018 0929   HGB 15.0 09/27/2018 0755   HCT 44.4 09/27/2018 0755   PLT 301 04/05/2018 0929   MCV 90 09/27/2018 0755   MCH 30.3 09/27/2018 0755   MCH 28.5 04/05/2018 0929   MCHC 33.8 09/27/2018 0755   MCHC 33.6 04/05/2018 0929   RDW 11.9 09/27/2018 0755   LYMPHSABS 1.3 09/27/2018 0755   EOSABS 0.1 09/27/2018 0755   BASOSABS 0.0 09/27/2018 0755   Iron/TIBC/Ferritin/ %Sat No results found for: IRON, TIBC, FERRITIN, IRONPCTSAT Lipid Panel     Component Value Date/Time   CHOL 113 09/27/2018 0755  TRIG 95 09/27/2018 0755   HDL 43 09/27/2018 0755   CHOLHDL 3.0 03/17/2018 0954   VLDL 19 06/05/2016 0827   LDLCALC 51 09/27/2018 0755   LDLCALC 62 03/17/2018 0954   Hepatic Function Panel     Component Value Date/Time   PROT 6.7 10/25/2018 0759   PROT 6.8 09/27/2018 0755   ALBUMIN 4.5 09/27/2018 0755   AST 18 10/25/2018 0759   ALT 17 10/25/2018 0759   ALKPHOS 61 09/27/2018 0755   BILITOT 0.7 10/25/2018 0759   BILITOT 0.7 09/27/2018 0755      Component Value Date/Time   TSH 1.040 09/27/2018 0755   TSH 1.18 06/05/2016 0827    Results for MOSE, COLAIZZI "BOB" (MRN [id number]) as of 12/30/2018 16:19  Ref. Range 09/27/2018 07:55  Vitamin D, 25-Hydroxy Latest Ref Range: 30.0 - 100.0 ng/mL 33.7    I, John Morrow, am acting as Location manager for Dennard Nip, MD I have reviewed the above documentation for accuracy and completeness, and I agree with the above. -Dennard Nip, MD

## 2019-01-04 ENCOUNTER — Ambulatory Visit: Payer: Medicare Other | Admitting: Family Medicine

## 2019-01-20 ENCOUNTER — Encounter (INDEPENDENT_AMBULATORY_CARE_PROVIDER_SITE_OTHER): Payer: Self-pay | Admitting: Family Medicine

## 2019-01-20 ENCOUNTER — Ambulatory Visit (INDEPENDENT_AMBULATORY_CARE_PROVIDER_SITE_OTHER): Payer: Medicare Other | Admitting: Family Medicine

## 2019-01-20 ENCOUNTER — Other Ambulatory Visit: Payer: Self-pay

## 2019-01-20 DIAGNOSIS — Z6839 Body mass index (BMI) 39.0-39.9, adult: Secondary | ICD-10-CM | POA: Diagnosis not present

## 2019-01-20 DIAGNOSIS — E66812 Obesity, class 2: Secondary | ICD-10-CM

## 2019-01-20 DIAGNOSIS — E559 Vitamin D deficiency, unspecified: Secondary | ICD-10-CM | POA: Diagnosis not present

## 2019-01-20 MED ORDER — VITAMIN D (ERGOCALCIFEROL) 1.25 MG (50000 UNIT) PO CAPS: 50000.0000 [IU] | ORAL_CAPSULE | ORAL | 0 refills | Status: DC

## 2019-01-20 NOTE — Progress Notes (Signed)
Office: [phone]  /  Fax: [phone] TeleHealth Visit:  John Morrow has verbally consented to this TeleHealth visit today. The patient is located at home, the provider is located at the [facility] office. The participants in this visit include the listed provider and patient. The visit was conducted today via [email or web address].  HPI:   Chief Complaint: OBESITY John Morrow is here to discuss his progress with his obesity treatment plan. He is on the Category 3 plan and is following his eating plan approximately 70 % of the time. He states he is walking 30 minutes 3 to 4 times per week. John Morrow feels that he has gained 3 to 4 pounds in the last 2 to 3 weeks. He has already gotten back on track and is feeling well. His hunger is controlled.  We were unable to weigh the patient today for this TeleHealth visit. He feels as if he has gained weight since his last visit. He has lost 10 lbs since starting treatment with [name].  Vitamin D Deficiency John Morrow has a diagnosis of vitamin D deficiency. He is currently stable on vit D, but is not yet at goal. John Morrow denies nausea, vomiting, or muscle weakness.  ASSESSMENT AND PLAN:  Vitamin D deficiency - Plan: Vitamin D, Ergocalciferol, (DRISDOL) 1.25 MG (50000 UT) CAPS capsule  Class 2 severe obesity with serious comorbidity and body mass index (BMI) of 39.0 to 39.9 in adult, unspecified obesity type ([place])  PLAN:  Vitamin D Deficiency John Morrow was informed that low vitamin D levels contribute to fatigue and are associated with obesity, breast, and colon cancer. John Morrow agrees to continue to take prescription Vit D @50 ,000 IU every week #4 with no refills and will follow up for routine testing of vitamin D, at least 2-3 times per year. He was informed of the risk of over-replacement of vitamin D and agrees to not increase his dose unless he discusses this with [name] first. We will recheck labs in 1 month. John Morrow agrees to follow up in 2 weeks as directed.   Obesity John Morrow is currently in the action stage of change. As such, his goal is to continue with weight loss efforts. He has agreed to follow the Category 3 plan. John Morrow has been instructed to work up to a goal of 150 minutes of combined cardio and strengthening exercise per week for weight loss and overall health benefits. We discussed the following Behavioral Modification Strategies today: dealing with family or coworker sabotage, keeping healthy foods in the home, and better snacking choices.  John Morrow has agreed to follow up with our clinic in 2 weeks. He was informed of the importance of frequent follow up visits to maximize his success with intensive lifestyle modifications for his multiple health conditions.  ALLERGIES: Allergies  Allergen Reactions  . Morphine And Related     Rash, vomiting, itching     MEDICATIONS: Current Outpatient Medications on File Prior to Visit  Medication Sig Dispense Refill  . amLODipine (NORVASC) 10 MG tablet Take 1 tablet by mouth once daily 90 tablet 0  . atorvastatin (LIPITOR) 40 MG tablet Take 1 tablet (40 mg total) by mouth daily. 90 tablet 3  . buPROPion (WELLBUTRIN XL) 300 MG 24 hr tablet TAKE 1 TABLET BY MOUTH ONCE DAILY 90 tablet 0  . diclofenac sodium (VOLTAREN) 1 % GEL Apply 4 g topically 4 (four) times daily. To affected joint. 100 g 11  . furosemide (LASIX) 20 MG tablet Take 1 tablet (20 mg total) by mouth  daily. 30 tablet 0  . lisinopril-hydrochlorothiazide (PRINZIDE,ZESTORETIC) 20-25 MG tablet TAKE 1 TABLET BY MOUTH ONCE DAILY 90 tablet 3  . omeprazole (PRILOSEC) 40 MG capsule Take 1 capsule (40 mg total) by mouth daily. 30 capsule 3  . polyethylene glycol powder (GLYCOLAX/MIRALAX) powder Take 17 g by mouth daily. 850 g 3  . tamsulosin (FLOMAX) 0.4 MG CAPS capsule Take 1 capsule (0.4 mg total) by mouth daily. 90 capsule 3  . traMADol (ULTRAM) 50 MG tablet TAKE 1 TABLET BY MOUTH TWICE DAILY FOR  CHRONIC  PAIN 60 tablet 0   No current  facility-administered medications on file prior to visit.     PAST MEDICAL HISTORY: Past Medical History:  Diagnosis Date  . Back pain   . Diverticulosis of colon without hemorrhage 06/12/2016   On Colonoscopy 11/06/2010  . HLD (hyperlipidemia) 05/29/2016  . Hyperlipemia   . Hypertension   . Joint pain   . Morbid obesity ([place]) 05/29/2016  . Prediabetes   . Right hip pain    needs hip replacement  . Sleep apnea     PAST SURGICAL HISTORY: Past Surgical History:  Procedure Laterality Date  . KNEE SURGERY      SOCIAL HISTORY: Social History   Tobacco Use  . Smoking status: Never Smoker  . Smokeless tobacco: Never Used  Substance Use Topics  . Alcohol use: Yes    Comment: occassionally  . Drug use: No    FAMILY HISTORY: Family History  Problem Relation Age of Onset  . Lung disease Mother   . Hypertension Mother   . Lung disease Father   . Alcoholism Father     ROS: Review of Systems  Gastrointestinal: Negative for nausea and vomiting.  Musculoskeletal:       Negative for muscle weakness.    PHYSICAL EXAM: Pt in no acute distress  RECENT LABS AND TESTS: BMET    Component Value Date/Time   NA 142 12/21/2018 0744   NA 144 09/27/2018 0755   K 3.6 12/21/2018 0744   CL 103 12/21/2018 0744   CO2 32 12/21/2018 0744   GLUCOSE 96 12/21/2018 0744   BUN 26 (H) 12/21/2018 0744   BUN 28 (H) 09/27/2018 0755   CREATININE 1.22 12/21/2018 0744   CALCIUM 9.4 12/21/2018 0744   GFRNONAA 52 (L) 10/25/2018 0759   GFRAA 61 10/25/2018 0759   Lab Results  Component Value Date   HGBA1C 5.4 09/27/2018   HGBA1C 5.4 06/05/2016   Lab Results  Component Value Date   INSULIN 17.7 09/27/2018   CBC    Component Value Date/Time   WBC 5.7 09/27/2018 0755   WBC 10.1 04/05/2018 0929   RBC 4.95 09/27/2018 0755   RBC 5.33 04/05/2018 0929   HGB 15.0 09/27/2018 0755   HCT 44.4 09/27/2018 0755   PLT 301 04/05/2018 0929   MCV 90 09/27/2018 0755   MCH 30.3 09/27/2018 0755    MCH 28.5 04/05/2018 0929   MCHC 33.8 09/27/2018 0755   MCHC 33.6 04/05/2018 0929   RDW 11.9 09/27/2018 0755   LYMPHSABS 1.3 09/27/2018 0755   EOSABS 0.1 09/27/2018 0755   BASOSABS 0.0 09/27/2018 0755   Iron/TIBC/Ferritin/ %Sat No results found for: IRON, TIBC, FERRITIN, IRONPCTSAT Lipid Panel     Component Value Date/Time   CHOL 113 09/27/2018 0755   TRIG 95 09/27/2018 0755   HDL 43 09/27/2018 0755   CHOLHDL 3.0 03/17/2018 0954   VLDL 19 06/05/2016 0827   LDLCALC 51 09/27/2018 0755  LDLCALC 62 03/17/2018 0954   Hepatic Function Panel     Component Value Date/Time   PROT 6.7 10/25/2018 0759   PROT 6.8 09/27/2018 0755   ALBUMIN 4.5 09/27/2018 0755   AST 18 10/25/2018 0759   ALT 17 10/25/2018 0759   ALKPHOS 61 09/27/2018 0755   BILITOT 0.7 10/25/2018 0759   BILITOT 0.7 09/27/2018 0755      Component Value Date/Time   TSH 1.040 09/27/2018 0755   TSH 1.18 06/05/2016 0827    Results for AKARI, CRYSLER "BOB" (MRN [id number]) as of 01/20/2019 15:06  Ref. Range 09/27/2018 07:55  Vitamin D, 25-Hydroxy Latest Ref Range: 30.0 - 100.0 ng/mL 33.7    I, Marcille Blanco, CMA, am acting as transcriptionist for Starlyn Skeans, MD I have reviewed the above documentation for accuracy and completeness, and I agree with the above. -Dennard Nip, MD

## 2019-01-26 ENCOUNTER — Ambulatory Visit: Payer: Medicare Other | Admitting: Family Medicine

## 2019-01-26 ENCOUNTER — Encounter: Payer: Self-pay | Admitting: Family Medicine

## 2019-01-26 VITALS — BP 119/72 | HR 73 | Temp 98.1°F | Wt 274.0 lb

## 2019-01-26 DIAGNOSIS — E782 Mixed hyperlipidemia: Secondary | ICD-10-CM | POA: Diagnosis not present

## 2019-01-26 DIAGNOSIS — I10 Essential (primary) hypertension: Secondary | ICD-10-CM | POA: Diagnosis not present

## 2019-01-26 DIAGNOSIS — R609 Edema, unspecified: Secondary | ICD-10-CM | POA: Diagnosis not present

## 2019-01-26 NOTE — Progress Notes (Signed)
John Morrow is a 68 y.o. male who presents to [facility] today for hypertension, hyperlipidemia, and obesity.  Hypertension: Currently taking amlodipine 10, and hydrochlorothiazide/lisinopril.  He tolerates his medications well.  He notes his blood pressures at home are typically less than 235T systolic.  He denies lightheadedness or dizziness.  He is interested in seeing if he can discontinue any medications.  Additionally John Morrow has hyperlipidemia.  He takes atorvastatin 40.  His last lipids in January were extremely well controlled.  He also would be interested in discontinuing medications or reducing doses.  Obesity: Management with medical supervised weight management.  Doing well.  Patient notes he had a little bit of a setback recently were he had increased snacking due to children being at his house.  He notes also he is able to exercise less with COVID-19.  Although he is optimistic with how things are going.   ROS as above:  Exam:  BP 119/72   Pulse 73   Temp 98.1 F (36.7 C) (Oral)   Wt 274 lb (124.3 kg)   BMI 40.46 kg/m  274 Wt Readings from Last 5 Encounters:  01/26/19 274 lb (124.3 kg)  11/24/18 269 lb (122 kg)  11/01/18 275 lb (124.7 kg)  10/07/18 279 lb (126.6 kg)  09/28/18 277 lb (125.6 kg)    Gen: Well NAD HEENT: EOMI,  MMM Lungs: Normal work of breathing. CTABL Heart: RRR no MRG Abd: NABS, Soft. Nondistended, Nontender Exts: Brisk capillary refill, warm and well perfused.   Lab and Radiology Results   Chemistry      Component Value Date/Time   NA 142 12/21/2018 0744   NA 144 09/27/2018 0755   K 3.6 12/21/2018 0744   CL 103 12/21/2018 0744   CO2 32 12/21/2018 0744   BUN 26 (H) 12/21/2018 0744   BUN 28 (H) 09/27/2018 0755   CREATININE 1.22 12/21/2018 0744      Component Value Date/Time   CALCIUM 9.4 12/21/2018 0744   ALKPHOS  61 09/27/2018 0755   AST 18 10/25/2018 0759   ALT 17 10/25/2018 0759   BILITOT 0.7 10/25/2018 0759   BILITOT 0.7 09/27/2018 0755     Lab Results  Component Value Date   CHOL 113 09/27/2018   HDL 43 09/27/2018   LDLCALC 51 09/27/2018   TRIG 95 09/27/2018   CHOLHDL 3.0 03/17/2018      Assessment and Plan: 68 y.o. male with  Hypertension: Blood pressure very well controlled.  With improve lifestyle weight loss I think it is reasonable to try reducing doses of medication.  We will try eliminating amlodipine first.  Keep home blood pressure log.  Likely will be able to eliminate furosemide along with amlodipine.  Hyperlipidemia: Doing quite well.  LDL was 50 in January.  Okay to reduce dose of atorvastatin to 20 mg.  Will likely recheck lipids at some point in the future.  Obesity: Doing well overall.  Slight weight regain.  Patient is back on adherent diet.  Management with medical weight loss clinic.  Recheck 6 months.  PDMP not reviewed this encounter. No orders of the defined types were placed in this encounter.  No orders of the defined types were placed in this encounter.    Historical information moved to improve visibility of documentation.  Past Medical History:  Diagnosis Date  . Back pain   . Diverticulosis of colon without hemorrhage 06/12/2016   On Colonoscopy 11/06/2010  .  HLD (hyperlipidemia) 05/29/2016  . Hyperlipemia   . Hypertension   . Joint pain   . Morbid obesity ([place]) 05/29/2016  . Prediabetes   . Right hip pain    needs hip replacement  . Sleep apnea    Past Surgical History:  Procedure Laterality Date  . KNEE SURGERY     Social History   Tobacco Use  . Smoking status: Never Smoker  . Smokeless tobacco: Never Used  Substance Use Topics  . Alcohol use: Yes    Comment: occassionally   family history includes Alcoholism in his father; Hypertension in his mother; Lung disease in his father and mother.  Medications: Current Outpatient  Medications  Medication Sig Dispense Refill  . amLODipine (NORVASC) 10 MG tablet Take 1 tablet by mouth once daily 90 tablet 0  . atorvastatin (LIPITOR) 40 MG tablet Take 1 tablet (40 mg total) by mouth daily. 90 tablet 3  . buPROPion (WELLBUTRIN XL) 300 MG 24 hr tablet TAKE 1 TABLET BY MOUTH ONCE DAILY 90 tablet 0  . diclofenac sodium (VOLTAREN) 1 % GEL Apply 4 g topically 4 (four) times daily. To affected joint. 100 g 11  . furosemide (LASIX) 20 MG tablet Take 1 tablet (20 mg total) by mouth daily. 30 tablet 0  . lisinopril-hydrochlorothiazide (PRINZIDE,ZESTORETIC) 20-25 MG tablet TAKE 1 TABLET BY MOUTH ONCE DAILY 90 tablet 3  . omeprazole (PRILOSEC) 40 MG capsule Take 1 capsule (40 mg total) by mouth daily. 30 capsule 3  . tamsulosin (FLOMAX) 0.4 MG CAPS capsule Take 1 capsule (0.4 mg total) by mouth daily. 90 capsule 3  . Vitamin D, Ergocalciferol, (DRISDOL) 1.25 MG (50000 UT) CAPS capsule Take 1 capsule (50,000 Units total) by mouth every 7 (seven) days. 4 capsule 0   No current facility-administered medications for this visit.    Allergies  Allergen Reactions  . Morphine And Related     Rash, vomiting, itching      Discussed warning signs or symptoms. Please see discharge instructions. Patient expresses understanding.

## 2019-01-26 NOTE — Patient Instructions (Signed)
Thank you for coming in today. Ok to try stopping the omeprazole for acid reflux.   OK to try a few weeks off the amlodipine for blood pressure. You probably will be able to quit the furosemide for ankle swelling as well.  Try a few weeks of the amlodipine and keep track of blood pressure.  If your numbers are 120s/70s then you can stay off.  We may decide to have reduced dose if needed.   Ok to cut in half the atorvastatin as well.    We will want to recheck in person in something like 6 months however if you have questions or an issue arrises we can do a video visit.   Flu vaccine this fall.   Keep me updated.

## 2019-02-02 ENCOUNTER — Other Ambulatory Visit: Payer: Self-pay | Admitting: Family Medicine

## 2019-02-03 ENCOUNTER — Encounter (INDEPENDENT_AMBULATORY_CARE_PROVIDER_SITE_OTHER): Payer: Self-pay | Admitting: Family Medicine

## 2019-02-03 ENCOUNTER — Ambulatory Visit (INDEPENDENT_AMBULATORY_CARE_PROVIDER_SITE_OTHER): Payer: Medicare Other | Admitting: Family Medicine

## 2019-02-03 ENCOUNTER — Encounter: Payer: Self-pay | Admitting: Family Medicine

## 2019-02-03 ENCOUNTER — Other Ambulatory Visit: Payer: Self-pay

## 2019-02-03 DIAGNOSIS — R7303 Prediabetes: Secondary | ICD-10-CM | POA: Diagnosis not present

## 2019-02-03 DIAGNOSIS — Z6839 Body mass index (BMI) 39.0-39.9, adult: Secondary | ICD-10-CM

## 2019-02-03 DIAGNOSIS — E559 Vitamin D deficiency, unspecified: Secondary | ICD-10-CM | POA: Diagnosis not present

## 2019-02-03 DIAGNOSIS — E8881 Metabolic syndrome: Secondary | ICD-10-CM | POA: Diagnosis not present

## 2019-02-03 MED ORDER — VITAMIN D (ERGOCALCIFEROL) 1.25 MG (50000 UNIT) PO CAPS: 50000.0000 [IU] | ORAL_CAPSULE | ORAL | 0 refills | Status: DC

## 2019-02-04 ENCOUNTER — Encounter (INDEPENDENT_AMBULATORY_CARE_PROVIDER_SITE_OTHER): Payer: Self-pay | Admitting: Family Medicine

## 2019-02-06 NOTE — Telephone Encounter (Signed)
Please review

## 2019-02-08 ENCOUNTER — Encounter (INDEPENDENT_AMBULATORY_CARE_PROVIDER_SITE_OTHER): Payer: Self-pay | Admitting: Family Medicine

## 2019-02-08 DIAGNOSIS — E8881 Metabolic syndrome: Secondary | ICD-10-CM | POA: Insufficient documentation

## 2019-02-08 DIAGNOSIS — E88819 Insulin resistance, unspecified: Secondary | ICD-10-CM | POA: Insufficient documentation

## 2019-02-08 DIAGNOSIS — Z6839 Body mass index (BMI) 39.0-39.9, adult: Secondary | ICD-10-CM | POA: Insufficient documentation

## 2019-02-08 DIAGNOSIS — R7303 Prediabetes: Secondary | ICD-10-CM | POA: Insufficient documentation

## 2019-02-08 DIAGNOSIS — E559 Vitamin D deficiency, unspecified: Secondary | ICD-10-CM | POA: Insufficient documentation

## 2019-02-08 NOTE — Progress Notes (Signed)
Office: [phone]  /  Fax: [phone] TeleHealth Visit:  John Morrow has verbally consented to this TeleHealth visit today. The patient is located at home, the provider is located at the [employer] [facility] office. The participants in this visit include the listed provider and patient. The visit was conducted today via Skype.  HPI:   Chief Complaint: OBESITY John Morrow is here to discuss his progress with his obesity treatment plan. He is on the Category 3 plan and is following his eating plan approximately 75-80% of the time. He states he is walking 30 minutes 3-4 times per week. John Morrow states he has been off of the plan recently but feels he is back on the plan now.  He does admit to eating out of boredom due to quarantine. He does report eating all of the protein on the plan. We discussed journaling briefly but he is happy with Category 3. We were unable to weigh the patient today for this TeleHealth visit. He feels as if he has maintained his weight since his last visit. He has lost 8 lbs since starting treatment with [name].  Vitamin D deficiency John Morrow has a diagnosis of Vitamin D deficiency, which is not at goal. His last Vitamin D level was reported to be 33.7 on 09/27/2018. He is currently taking prescription Vit D and denies nausea, vomiting or muscle weakness.  Insulin Resistance John Morrow has a diagnosis of insulin resistance based on his elevated fasting insulin level >5. Although John Morrow's blood glucose readings are still under good control, insulin resistance puts him at greater risk of metabolic syndrome and diabetes. He is not taking metformin currently and continues to work on diet and exercise to decrease risk of diabetes. John Morrow reports hunger to be fairly well controlled. We discussed metformin and he plans to research this.  ASSESSMENT AND PLAN:  Vitamin D deficiency - Plan: Vitamin D, Ergocalciferol, (DRISDOL) 1.25 MG (50000 UT) CAPS capsule  Insulin resistance   Class 2 severe obesity with serious comorbidity and body mass index (BMI) of 39.0 to 39.9 in adult, unspecified obesity type ([place])  PLAN:  Vitamin D Deficiency John Morrow was informed that low Vitamin D levels contributes to fatigue and are associated with obesity, breast, and colon cancer. He agrees to continue to take prescription Vit D @ 50,000 IU every week #4 with 0 refills. He will ask his PCP to check his Vitamin D level at his upcoming lab draw. He was informed of the risk of over-replacement of Vitamin D and agrees to not increase his dose unless he discusses this with [name] first. John Morrow agrees to follow-up with our clinic in 2 weeks.  Insulin Resistance John Morrow will continue to work on weight loss, exercise, and decreasing simple carbohydrates in his diet to help decrease the risk of diabetes. We discussed metformin including benefits and risks and he plans on researching this. John Morrow will continue his meal plan and will follow-up with [name] as directed to monitor his progress.  Obesity John Morrow is currently in the action stage of change. As such, his goal is to continue with weight loss efforts. He has agreed to follow the Category 3 plan. John Morrow has been instructed to continue his current exercise regimen for weight loss and overall health benefits. We discussed the following Behavioral Modification Strategies today: emotional eating strategies and planning for success.  John Morrow has agreed to follow-up with our clinic in 2 weeks. He was informed of the importance of frequent follow-up visits to maximize his success with intensive  lifestyle modifications for his multiple health conditions.  ALLERGIES: Allergies  Allergen Reactions  . Morphine And Related     Rash, vomiting, itching     MEDICATIONS: Current Outpatient Medications on File Prior to Visit  Medication Sig Dispense Refill  . amLODipine (NORVASC) 10 MG tablet Take 1 tablet by mouth once daily 90 tablet 0  . atorvastatin  (LIPITOR) 40 MG tablet Take 1 tablet (40 mg total) by mouth daily. 90 tablet 3  . buPROPion (WELLBUTRIN XL) 300 MG 24 hr tablet TAKE 1 TABLET BY MOUTH ONCE DAILY 90 tablet 0  . diclofenac sodium (VOLTAREN) 1 % GEL Apply 4 g topically 4 (four) times daily. To affected joint. 100 g 11  . furosemide (LASIX) 20 MG tablet Take 1 tablet (20 mg total) by mouth daily. 30 tablet 0  . lisinopril-hydrochlorothiazide (ZESTORETIC) 20-25 MG tablet Take 1 tablet by mouth once daily 90 tablet 1  . omeprazole (PRILOSEC) 40 MG capsule Take 1 capsule (40 mg total) by mouth daily. 30 capsule 3  . tamsulosin (FLOMAX) 0.4 MG CAPS capsule Take 1 capsule (0.4 mg total) by mouth daily. 90 capsule 3   No current facility-administered medications on file prior to visit.     PAST MEDICAL HISTORY: Past Medical History:  Diagnosis Date  . Back pain   . Diverticulosis of colon without hemorrhage 06/12/2016   On Colonoscopy 11/06/2010  . HLD (hyperlipidemia) 05/29/2016  . Hyperlipemia   . Hypertension   . Joint pain   . Morbid obesity ([place]) 05/29/2016  . Prediabetes   . Right hip pain    needs hip replacement  . Sleep apnea     PAST SURGICAL HISTORY: Past Surgical History:  Procedure Laterality Date  . KNEE SURGERY      SOCIAL HISTORY: Social History   Tobacco Use  . Smoking status: Never Smoker  . Smokeless tobacco: Never Used  Substance Use Topics  . Alcohol use: Yes    Comment: occassionally  . Drug use: No    FAMILY HISTORY: Family History  Problem Relation Age of Onset  . Lung disease Mother   . Hypertension Mother   . Lung disease Father   . Alcoholism Father    ROS: Review of Systems  Gastrointestinal: Negative for nausea and vomiting.  Musculoskeletal:       Negative for muscle weakness.   PHYSICAL EXAM: Pt in no acute distress  RECENT LABS AND TESTS: BMET    Component Value Date/Time   NA 142 12/21/2018 0744   NA 144 09/27/2018 0755   K 3.6 12/21/2018 0744   CL 103  12/21/2018 0744   CO2 32 12/21/2018 0744   GLUCOSE 96 12/21/2018 0744   BUN 26 (H) 12/21/2018 0744   BUN 28 (H) 09/27/2018 0755   CREATININE 1.22 12/21/2018 0744   CALCIUM 9.4 12/21/2018 0744   GFRNONAA 52 (L) 10/25/2018 0759   GFRAA 61 10/25/2018 0759   Lab Results  Component Value Date   HGBA1C 5.4 09/27/2018   HGBA1C 5.4 06/05/2016   Lab Results  Component Value Date   INSULIN 17.7 09/27/2018   CBC    Component Value Date/Time   WBC 5.7 09/27/2018 0755   WBC 10.1 04/05/2018 0929   RBC 4.95 09/27/2018 0755   RBC 5.33 04/05/2018 0929   HGB 15.0 09/27/2018 0755   HCT 44.4 09/27/2018 0755   PLT 301 04/05/2018 0929   MCV 90 09/27/2018 0755   MCH 30.3 09/27/2018 0755   MCH 28.5 04/05/2018  0929   MCHC 33.8 09/27/2018 0755   MCHC 33.6 04/05/2018 0929   RDW 11.9 09/27/2018 0755   LYMPHSABS 1.3 09/27/2018 0755   EOSABS 0.1 09/27/2018 0755   BASOSABS 0.0 09/27/2018 0755   Iron/TIBC/Ferritin/ %Sat No results found for: IRON, TIBC, FERRITIN, IRONPCTSAT Lipid Panel     Component Value Date/Time   CHOL 113 09/27/2018 0755   TRIG 95 09/27/2018 0755   HDL 43 09/27/2018 0755   CHOLHDL 3.0 03/17/2018 0954   VLDL 19 06/05/2016 0827   LDLCALC 51 09/27/2018 0755   LDLCALC 62 03/17/2018 0954   Hepatic Function Panel     Component Value Date/Time   PROT 6.7 10/25/2018 0759   PROT 6.8 09/27/2018 0755   ALBUMIN 4.5 09/27/2018 0755   AST 18 10/25/2018 0759   ALT 17 10/25/2018 0759   ALKPHOS 61 09/27/2018 0755   BILITOT 0.7 10/25/2018 0759   BILITOT 0.7 09/27/2018 0755      Component Value Date/Time   TSH 1.040 09/27/2018 0755   TSH 1.18 06/05/2016 0827   Results for JAMIAN, ANDUJO "BOB" (MRN [id number]) as of 02/08/2019 10:38  Ref. Range 09/27/2018 07:55  Vitamin D, 25-Hydroxy Latest Ref Range: 30.0 - 100.0 ng/mL 33.7    I, Michaelene Song, am acting as Location manager for [employer], FNP-C.  I have reviewed the above documentation for accuracy and completeness, and  I agree with the above.  - Sondi Desch, FNP-C.

## 2019-02-13 ENCOUNTER — Other Ambulatory Visit: Payer: Self-pay | Admitting: Family Medicine

## 2019-02-17 ENCOUNTER — Encounter (INDEPENDENT_AMBULATORY_CARE_PROVIDER_SITE_OTHER): Payer: Self-pay | Admitting: Family Medicine

## 2019-02-17 ENCOUNTER — Other Ambulatory Visit: Payer: Self-pay

## 2019-02-17 ENCOUNTER — Ambulatory Visit (INDEPENDENT_AMBULATORY_CARE_PROVIDER_SITE_OTHER): Payer: Medicare Other | Admitting: Family Medicine

## 2019-02-17 DIAGNOSIS — I10 Essential (primary) hypertension: Secondary | ICD-10-CM

## 2019-02-17 DIAGNOSIS — Z6839 Body mass index (BMI) 39.0-39.9, adult: Secondary | ICD-10-CM

## 2019-02-21 NOTE — Progress Notes (Signed)
Office: [phone]  /  Fax: [phone] TeleHealth Visit:  John Morrow has verbally consented to this TeleHealth visit today. The patient is located at home, the provider is located at the [employer] [facility] office. The participants in this visit include the listed provider and patient. John Morrow was unable to use realtime audiovisual technology today and the telehealth visit was conducted via telephone.   HPI:   Chief Complaint: OBESITY John Morrow is here to discuss his progress with his obesity treatment plan. He is on the Category 3 plan and is following his eating plan approximately 75-80 % of the time. He states he is walking for 20-30 minutes 2-3 times per week. John Morrow continues to do well with his eating plan, and he is walking for exercise regularly. His hunger is controlled and he is minimizing eating out.  We were unable to weigh the patient today for this TeleHealth visit. He feels as if he has lost 1-2 lbs since his last visit. He has lost 8-10 lbs since starting treatment with [name].  Hypertension John Morrow is a 68 y.o. male with hypertension. John Morrow blood pressure is 131/79. He is now off his Norvasc and lasix. He states his blood pressure is staying controlled, and he has had no increase in any edema. He is doing well with diet and exercise, and is still on his lisinopril-hydrochlorothiazide. John Morrow denies chest pain.    ASSESSMENT AND PLAN:  Essential hypertension  Class 2 severe obesity with serious comorbidity and body mass index (BMI) of 39.0 to 39.9 in adult, unspecified obesity type ([place])  PLAN:  Hypertension We discussed sodium restriction, working on healthy weight loss, and a regular exercise program as the means to achieve improved blood pressure control. John Morrow agreed with this plan and agreed to follow up as directed. We will continue to monitor his blood pressure as well as his progress with the above lifestyle modifications. John Morrow agrees to  continue his medications, diet, and exercise, and will watch for signs of hypotension as he continues his lifestyle modifications. He was congratulated on being healthy enough to decrease his medications. John Morrow agrees to follow up with our clinic in 3 weeks.  I spent > than 50% of the 15 minute visit on counseling as documented in the note.  Obesity John Morrow is currently in the action stage of change. As such, his goal is to continue with weight loss efforts He has agreed to follow the Category 3 plan John Morrow has been instructed to work up to a goal of 150 minutes of combined cardio and strengthening exercise per week for weight loss and overall health benefits. We discussed the following Behavioral Modification Strategies today: work on meal planning and easy cooking plans and planning for success   John Morrow has agreed to follow up with our clinic in 3 weeks. He was informed of the importance of frequent follow up visits to maximize his success with intensive lifestyle modifications for his multiple health conditions.  ALLERGIES: Allergies  Allergen Reactions  . Morphine And Related     Rash, vomiting, itching     MEDICATIONS: Current Outpatient Medications on File Prior to Visit  Medication Sig Dispense Refill  . atorvastatin (LIPITOR) 40 MG tablet Take 1 tablet (40 mg total) by mouth daily. (Patient taking differently: Take 20 mg by mouth daily. ) 90 tablet 3  . buPROPion (WELLBUTRIN XL) 300 MG 24 hr tablet Take 1 tablet by mouth once daily 90 tablet 0  . diclofenac sodium (VOLTAREN) 1 % GEL  Apply 4 g topically 4 (four) times daily. To affected joint. 100 g 11  . lisinopril-hydrochlorothiazide (ZESTORETIC) 20-25 MG tablet Take 1 tablet by mouth once daily 90 tablet 1  . tamsulosin (FLOMAX) 0.4 MG CAPS capsule Take 1 capsule (0.4 mg total) by mouth daily. 90 capsule 3  . Vitamin D, Ergocalciferol, (DRISDOL) 1.25 MG (50000 UT) CAPS capsule Take 1 capsule (50,000 Units total) by mouth every  7 (seven) days. 4 capsule 0   No current facility-administered medications on file prior to visit.     PAST MEDICAL HISTORY: Past Medical History:  Diagnosis Date  . Back pain   . Diverticulosis of colon without hemorrhage 06/12/2016   On Colonoscopy 11/06/2010  . HLD (hyperlipidemia) 05/29/2016  . Hyperlipemia   . Hypertension   . Joint pain   . Morbid obesity ([place]) 05/29/2016  . Prediabetes   . Right hip pain    needs hip replacement  . Sleep apnea     PAST SURGICAL HISTORY: Past Surgical History:  Procedure Laterality Date  . KNEE SURGERY      SOCIAL HISTORY: Social History   Tobacco Use  . Smoking status: Never Smoker  . Smokeless tobacco: Never Used  Substance Use Topics  . Alcohol use: Yes    Comment: occassionally  . Drug use: No    FAMILY HISTORY: Family History  Problem Relation Age of Onset  . Lung disease Mother   . Hypertension Mother   . Lung disease Father   . Alcoholism Father     ROS: Review of Systems  Constitutional: Positive for weight loss.  Cardiovascular: Negative for chest pain.    PHYSICAL EXAM: Pt in no acute distress  RECENT LABS AND TESTS: BMET    Component Value Date/Time   NA 142 12/21/2018 0744   NA 144 09/27/2018 0755   K 3.6 12/21/2018 0744   CL 103 12/21/2018 0744   CO2 32 12/21/2018 0744   GLUCOSE 96 12/21/2018 0744   BUN 26 (H) 12/21/2018 0744   BUN 28 (H) 09/27/2018 0755   CREATININE 1.22 12/21/2018 0744   CALCIUM 9.4 12/21/2018 0744   GFRNONAA 52 (L) 10/25/2018 0759   GFRAA 61 10/25/2018 0759   Lab Results  Component Value Date   HGBA1C 5.4 09/27/2018   HGBA1C 5.4 06/05/2016   Lab Results  Component Value Date   INSULIN 17.7 09/27/2018   CBC    Component Value Date/Time   WBC 5.7 09/27/2018 0755   WBC 10.1 04/05/2018 0929   RBC 4.95 09/27/2018 0755   RBC 5.33 04/05/2018 0929   HGB 15.0 09/27/2018 0755   HCT 44.4 09/27/2018 0755   PLT 301 04/05/2018 0929   MCV 90 09/27/2018 0755   MCH 30.3  09/27/2018 0755   MCH 28.5 04/05/2018 0929   MCHC 33.8 09/27/2018 0755   MCHC 33.6 04/05/2018 0929   RDW 11.9 09/27/2018 0755   LYMPHSABS 1.3 09/27/2018 0755   EOSABS 0.1 09/27/2018 0755   BASOSABS 0.0 09/27/2018 0755   Iron/TIBC/Ferritin/ %Sat No results found for: IRON, TIBC, FERRITIN, IRONPCTSAT Lipid Panel     Component Value Date/Time   CHOL 113 09/27/2018 0755   TRIG 95 09/27/2018 0755   HDL 43 09/27/2018 0755   CHOLHDL 3.0 03/17/2018 0954   VLDL 19 06/05/2016 0827   LDLCALC 51 09/27/2018 0755   LDLCALC 62 03/17/2018 0954   Hepatic Function Panel     Component Value Date/Time   PROT 6.7 10/25/2018 0759   PROT 6.8 09/27/2018  0755   ALBUMIN 4.5 09/27/2018 0755   AST 18 10/25/2018 0759   ALT 17 10/25/2018 0759   ALKPHOS 61 09/27/2018 0755   BILITOT 0.7 10/25/2018 0759   BILITOT 0.7 09/27/2018 0755      Component Value Date/Time   TSH 1.040 09/27/2018 0755   TSH 1.18 06/05/2016 0827      I, Trixie Dredge, am acting as transcriptionist for Dennard Nip, MD I have reviewed the above documentation for accuracy and completeness, and I agree with the above. -Dennard Nip, MD

## 2019-03-10 ENCOUNTER — Encounter (INDEPENDENT_AMBULATORY_CARE_PROVIDER_SITE_OTHER): Payer: Self-pay | Admitting: Family Medicine

## 2019-03-10 ENCOUNTER — Telehealth (INDEPENDENT_AMBULATORY_CARE_PROVIDER_SITE_OTHER): Payer: Medicare Other | Admitting: Family Medicine

## 2019-03-10 ENCOUNTER — Other Ambulatory Visit: Payer: Self-pay

## 2019-03-10 DIAGNOSIS — Z6839 Body mass index (BMI) 39.0-39.9, adult: Secondary | ICD-10-CM

## 2019-03-10 DIAGNOSIS — E559 Vitamin D deficiency, unspecified: Secondary | ICD-10-CM | POA: Diagnosis not present

## 2019-03-10 DIAGNOSIS — Z711 Person with feared health complaint in whom no diagnosis is made: Secondary | ICD-10-CM | POA: Diagnosis not present

## 2019-03-12 MED ORDER — VITAMIN D (ERGOCALCIFEROL) 1.25 MG (50000 UNIT) PO CAPS: 50000.0000 [IU] | ORAL_CAPSULE | ORAL | 0 refills | Status: DC

## 2019-03-14 NOTE — Progress Notes (Signed)
Office: [phone]  /  Fax: [phone] TeleHealth Visit:  John Morrow has verbally consented to this TeleHealth visit today. The patient is located at home, the provider is located at the [facility] office. The participants in this visit include the listed provider and patient. The visit was conducted today via telephone call (FaceTime failed - changed to telephone call).  HPI:   Chief Complaint: OBESITY John Morrow is here to discuss his progress with his obesity treatment plan. He is on the Category 3 plan and is following his eating plan approximately 80-85% of the time. He states he has been walking in the afternoons. John Morrow continues to do well maintaining his weight and reports his hunger is controlled. He is doing more home improvement projects and keeping busy. He has questions about which fruits are higher in sugar. We were unable to weigh the patient today for this TeleHealth visit. He feels as if he has maintained his weight since his last visit. He has lost 8 lbs since starting treatment with [name].  Vitamin D deficiency John Morrow has a diagnosis of Vitamin D deficiency. He is currently taking prescription Vit D and denies nausea, vomiting or muscle weakness.  Worried Well John Morrow has questions about COVID-19 exposure, wearing masks, etc.  ASSESSMENT AND PLAN:  Vitamin D deficiency - Plan: Vitamin D, Ergocalciferol, (DRISDOL) 1.25 MG (50000 UT) CAPS capsule  Worried well  Class 2 severe obesity with serious comorbidity and body mass index (BMI) of 39.0 to 39.9 in adult, unspecified obesity type (John Morrow)  PLAN:  Vitamin D Deficiency John Morrow was informed that low Vitamin D levels contributes to fatigue and are associated with obesity, breast, and colon cancer. He agrees to continue to take prescription Vit D @ 50,000 IU every week #4 with 0 refills and will follow-up for routine testing of Vitamin D, at least 2-3 times per year. He was informed of the risk of  over-replacement of Vitamin D and agrees to not increase his dose unless he discusses this with [name] first. John Morrow agrees to follow-up with our clinic in 3 weeks.  Worried Well Pt was educated on the importance of wearing masks and social distancing as well as hand washing to reduce the risk of transmission and he agreed to do so. We discussed symptoms of COVID19 and we discussed the possible need for sequestration and how to deal with this if it happens. Pt offered guidance and reassurance.  I spent > than 50% of the 25 minute visit on counseling as documented in the note.  Obesity John Morrow is currently in the action stage of change. As such, his goal is to continue with weight loss efforts. He has agreed to follow the Category 3 plan. We discussed with the patient high  Versus low sugar fruits. John Morrow has been instructed to work up to a goal of 150 minutes of combined cardio and strengthening exercise per week for weight loss and overall health benefits. We discussed the following Behavioral Modification Strategies today: increasing vegetables, work on meal planning, easy cooking plans, and ways to avoid boredom eating.  John Morrow has agreed to follow-up with our clinic in 3 weeks. He was informed of the importance of frequent follow-up visits to maximize his success with intensive lifestyle modifications for his multiple health conditions.  ALLERGIES: Allergies  Allergen Reactions  . Morphine And Related     Rash, vomiting, itching     MEDICATIONS: Current Outpatient Medications on File Prior to Visit  Medication Sig Dispense Refill  .  atorvastatin (LIPITOR) 40 MG tablet Take 1 tablet (40 mg total) by mouth daily. (Patient taking differently: Take 20 mg by mouth daily. ) 90 tablet 3  . buPROPion (WELLBUTRIN XL) 300 MG 24 hr tablet Take 1 tablet by mouth once daily 90 tablet 0  . diclofenac sodium (VOLTAREN) 1 % GEL Apply 4 g topically 4 (four) times daily. To affected joint. 100 g 11  .  lisinopril-hydrochlorothiazide (ZESTORETIC) 20-25 MG tablet Take 1 tablet by mouth once daily 90 tablet 1  . tamsulosin (FLOMAX) 0.4 MG CAPS capsule Take 1 capsule (0.4 mg total) by mouth daily. 90 capsule 3   No current facility-administered medications on file prior to visit.     PAST MEDICAL HISTORY: Past Medical History:  Diagnosis Date  . Back pain   . Diverticulosis of colon without hemorrhage 06/12/2016   On Colonoscopy 11/06/2010  . HLD (hyperlipidemia) 05/29/2016  . Hyperlipemia   . Hypertension   . Joint pain   . Morbid obesity ([place]) 05/29/2016  . Prediabetes   . Right hip pain    needs hip replacement  . Sleep apnea     PAST SURGICAL HISTORY: Past Surgical History:  Procedure Laterality Date  . KNEE SURGERY      SOCIAL HISTORY: Social History   Tobacco Use  . Smoking status: Never Smoker  . Smokeless tobacco: Never Used  Substance Use Topics  . Alcohol use: Yes    Comment: occassionally  . Drug use: No    FAMILY HISTORY: Family History  Problem Relation Age of Onset  . Lung disease Mother   . Hypertension Mother   . Lung disease Father   . Alcoholism Father    ROS: Review of Systems  Gastrointestinal: Negative for nausea and vomiting.  Musculoskeletal:       Negative for muscle weakness.   PHYSICAL EXAM: Pt in no acute distress  RECENT LABS AND TESTS: BMET    Component Value Date/Time   NA 142 12/21/2018 0744   NA 144 09/27/2018 0755   K 3.6 12/21/2018 0744   CL 103 12/21/2018 0744   CO2 32 12/21/2018 0744   GLUCOSE 96 12/21/2018 0744   BUN 26 (H) 12/21/2018 0744   BUN 28 (H) 09/27/2018 0755   CREATININE 1.22 12/21/2018 0744   CALCIUM 9.4 12/21/2018 0744   GFRNONAA 52 (L) 10/25/2018 0759   GFRAA 61 10/25/2018 0759   Lab Results  Component Value Date   HGBA1C 5.4 09/27/2018   HGBA1C 5.4 06/05/2016   Lab Results  Component Value Date   INSULIN 17.7 09/27/2018   CBC    Component Value Date/Time   WBC 5.7 09/27/2018 0755    WBC 10.1 04/05/2018 0929   RBC 4.95 09/27/2018 0755   RBC 5.33 04/05/2018 0929   HGB 15.0 09/27/2018 0755   HCT 44.4 09/27/2018 0755   PLT 301 04/05/2018 0929   MCV 90 09/27/2018 0755   MCH 30.3 09/27/2018 0755   MCH 28.5 04/05/2018 0929   MCHC 33.8 09/27/2018 0755   MCHC 33.6 04/05/2018 0929   RDW 11.9 09/27/2018 0755   LYMPHSABS 1.3 09/27/2018 0755   EOSABS 0.1 09/27/2018 0755   BASOSABS 0.0 09/27/2018 0755   Iron/TIBC/Ferritin/ %Sat No results found for: IRON, TIBC, FERRITIN, IRONPCTSAT Lipid Panel     Component Value Date/Time   CHOL 113 09/27/2018 0755   TRIG 95 09/27/2018 0755   HDL 43 09/27/2018 0755   CHOLHDL 3.0 03/17/2018 0954   VLDL 19 06/05/2016 0827  LDLCALC 51 09/27/2018 0755   LDLCALC 62 03/17/2018 0954   Hepatic Function Panel     Component Value Date/Time   PROT 6.7 10/25/2018 0759   PROT 6.8 09/27/2018 0755   ALBUMIN 4.5 09/27/2018 0755   AST 18 10/25/2018 0759   ALT 17 10/25/2018 0759   ALKPHOS 61 09/27/2018 0755   BILITOT 0.7 10/25/2018 0759   BILITOT 0.7 09/27/2018 0755      Component Value Date/Time   TSH 1.040 09/27/2018 0755   TSH 1.18 06/05/2016 0827   Results for TOBEY, SCHMELZLE "BOB" (MRN [id number]) as of 03/14/2019 10:16  Ref. Range 09/27/2018 07:55  Vitamin D, 25-Hydroxy Latest Ref Range: 30.0 - 100.0 ng/mL 33.7    I, Michaelene Song, am acting as Location manager for Dennard Nip, MD I have reviewed the above documentation for accuracy and completeness, and I agree with the above. -Dennard Nip, MD

## 2019-03-16 HISTORY — PX: REPLACEMENT TOTAL HIP W/  RESURFACING IMPLANTS: SUR1222

## 2019-03-24 ENCOUNTER — Encounter (INDEPENDENT_AMBULATORY_CARE_PROVIDER_SITE_OTHER): Payer: Self-pay | Admitting: Family Medicine

## 2019-03-24 ENCOUNTER — Other Ambulatory Visit: Payer: Self-pay

## 2019-03-24 ENCOUNTER — Telehealth (INDEPENDENT_AMBULATORY_CARE_PROVIDER_SITE_OTHER): Payer: Medicare Other | Admitting: Family Medicine

## 2019-03-24 DIAGNOSIS — E8881 Metabolic syndrome: Secondary | ICD-10-CM | POA: Diagnosis not present

## 2019-03-24 DIAGNOSIS — Z6839 Body mass index (BMI) 39.0-39.9, adult: Secondary | ICD-10-CM

## 2019-03-28 NOTE — Progress Notes (Signed)
Office: [phone]  /  Fax: [phone] TeleHealth Visit:  John Morrow has verbally consented to this TeleHealth visit today. The patient is located at home, the provider is located at the [facility] office. The participants in this visit include the listed provider and patient. The visit was conducted today via [email or web address].  HPI:   Chief Complaint: OBESITY John Morrow is here to discuss his progress with his obesity treatment plan. He is on the  follow the Category 3 plan and is following his eating plan approximately 80 % of the time. He states he is exercising by walking for 30 minutes 3-4 times per week. John Morrow continues to do well on his category 3 eating plan. His hunger is controlled and he is walking 1-2 x a day when the weather permits and has been working on clearing out his garage as exercise. He is happy with his routine and states he has lost another 1 lb since our last visit 2 weeks ago.  We were unable to weigh the patient today for this TeleHealth visit. He feels as if he has lost weight since his last visit. He has lost 8-10 lbs since starting treatment with [name].  Insulin Resistance John Morrow has a diagnosis of insulin resistance based on his elevated fasting insulin level >5. Although John Morrow's blood glucose readings are still under good control, insulin resistance puts him at greater risk of metabolic syndrome and diabetes. He is not taking metformin currently and continues to do well on diet and exercise to decrease risk of diabetes. He reports polyphagia decreased when he follows the plan. He denies hypoglycemia.   ASSESSMENT AND PLAN:  Insulin resistance  Class 2 severe obesity with serious comorbidity and body mass index (BMI) of 39.0 to 39.9 in adult, unspecified obesity type (John Morrow)  PLAN: Insulin Resistance John Morrow will continue to work on weight loss, exercise, and decreasing simple carbohydrates in his diet to help decrease the risk of diabetes. We dicussed  metformin including benefits and risks. He was informed that eating too many simple carbohydrates or too many calories at one sitting increases the likelihood of GI side effects. John Morrow agreed to follow up with [name] as directed to monitor his progress. We will recheck labs in 1 month.   I spent > than 50% of the 15 minute visit on counseling as documented in the note.  Obesity John Morrow is currently in the action stage of change. As such, his goal is to continue with weight loss efforts He has agreed to follow the Category 3 plan John Morrow has been instructed to work up to a goal of 150 minutes of combined cardio and strengthening exercise per week for weight loss and overall health benefits. We discussed the following Behavioral Modification Stratagies today: work on meal planning and easy cooking plans and no skipping meals.    John Morrow has agreed to follow up with our clinic in 2 weeks. He was informed of the importance of frequent follow up visits to maximize his success with intensive lifestyle modifications for his multiple health conditions.  ALLERGIES: Allergies  Allergen Reactions  . Morphine And Related     Rash, vomiting, itching     MEDICATIONS: Current Outpatient Medications on File Prior to Visit  Medication Sig Dispense Refill  . atorvastatin (LIPITOR) 40 MG tablet Take 1 tablet (40 mg total) by mouth daily. (Patient taking differently: Take 20 mg by mouth daily. ) 90 tablet 3  . buPROPion (WELLBUTRIN XL) 300 MG 24 hr tablet Take 1  tablet by mouth once daily 90 tablet 0  . diclofenac sodium (VOLTAREN) 1 % GEL Apply 4 g topically 4 (four) times daily. To affected joint. 100 g 11  . lisinopril-hydrochlorothiazide (ZESTORETIC) 20-25 MG tablet Take 1 tablet by mouth once daily 90 tablet 1  . tamsulosin (FLOMAX) 0.4 MG CAPS capsule Take 1 capsule (0.4 mg total) by mouth daily. 90 capsule 3  . Vitamin D, Ergocalciferol, (DRISDOL) 1.25 MG (50000 UT) CAPS capsule Take 1 capsule (50,000  Units total) by mouth every 7 (seven) days. 4 capsule 0   No current facility-administered medications on file prior to visit.     PAST MEDICAL HISTORY: Past Medical History:  Diagnosis Date  . Back pain   . Diverticulosis of colon without hemorrhage 06/12/2016   On Colonoscopy 11/06/2010  . HLD (hyperlipidemia) 05/29/2016  . Hyperlipemia   . Hypertension   . Joint pain   . Morbid obesity (John Morrow) 05/29/2016  . Prediabetes   . Right hip pain    needs hip replacement  . Sleep apnea     PAST SURGICAL HISTORY: Past Surgical History:  Procedure Laterality Date  . KNEE SURGERY      SOCIAL HISTORY: Social History   Tobacco Use  . Smoking status: Never Smoker  . Smokeless tobacco: Never Used  Substance Use Topics  . Alcohol use: Yes    Comment: occassionally  . Drug use: No    FAMILY HISTORY: Family History  Problem Relation Age of Onset  . Lung disease Mother   . Hypertension Mother   . Lung disease Father   . Alcoholism Father     ROS: Review of Systems  Endo/Heme/Allergies:       Negative for hypoglycemia     PHYSICAL EXAM: Pt in no acute distress  RECENT LABS AND TESTS: BMET    Component Value Date/Time   NA 142 12/21/2018 0744   NA 144 09/27/2018 0755   K 3.6 12/21/2018 0744   CL 103 12/21/2018 0744   CO2 32 12/21/2018 0744   GLUCOSE 96 12/21/2018 0744   BUN 26 (H) 12/21/2018 0744   BUN 28 (H) 09/27/2018 0755   CREATININE 1.22 12/21/2018 0744   CALCIUM 9.4 12/21/2018 0744   GFRNONAA 52 (L) 10/25/2018 0759   GFRAA 61 10/25/2018 0759   Lab Results  Component Value Date   HGBA1C 5.4 09/27/2018   HGBA1C 5.4 06/05/2016   Lab Results  Component Value Date   INSULIN 17.7 09/27/2018   CBC    Component Value Date/Time   WBC 5.7 09/27/2018 0755   WBC 10.1 04/05/2018 0929   RBC 4.95 09/27/2018 0755   RBC 5.33 04/05/2018 0929   HGB 15.0 09/27/2018 0755   HCT 44.4 09/27/2018 0755   PLT 301 04/05/2018 0929   MCV 90 09/27/2018 0755   MCH 30.3  09/27/2018 0755   MCH 28.5 04/05/2018 0929   MCHC 33.8 09/27/2018 0755   MCHC 33.6 04/05/2018 0929   RDW 11.9 09/27/2018 0755   LYMPHSABS 1.3 09/27/2018 0755   EOSABS 0.1 09/27/2018 0755   BASOSABS 0.0 09/27/2018 0755   Iron/TIBC/Ferritin/ %Sat No results found for: IRON, TIBC, FERRITIN, IRONPCTSAT Lipid Panel     Component Value Date/Time   CHOL 113 09/27/2018 0755   TRIG 95 09/27/2018 0755   HDL 43 09/27/2018 0755   CHOLHDL 3.0 03/17/2018 0954   VLDL 19 06/05/2016 0827   LDLCALC 51 09/27/2018 0755   LDLCALC 62 03/17/2018 0954   Hepatic Function Panel  Component Value Date/Time   PROT 6.7 10/25/2018 0759   PROT 6.8 09/27/2018 0755   ALBUMIN 4.5 09/27/2018 0755   AST 18 10/25/2018 0759   ALT 17 10/25/2018 0759   ALKPHOS 61 09/27/2018 0755   BILITOT 0.7 10/25/2018 0759   BILITOT 0.7 09/27/2018 0755      Component Value Date/Time   TSH 1.040 09/27/2018 0755   TSH 1.18 06/05/2016 0827      I, Renee Ramus, am acting as transcriptionist for Dennard Nip, MD  I have reviewed the above documentation for accuracy and completeness, and I agree with the above. -Dennard Nip, MD

## 2019-04-07 ENCOUNTER — Other Ambulatory Visit: Payer: Self-pay

## 2019-04-07 ENCOUNTER — Telehealth (INDEPENDENT_AMBULATORY_CARE_PROVIDER_SITE_OTHER): Payer: Medicare Other | Admitting: Family Medicine

## 2019-04-07 ENCOUNTER — Encounter: Payer: Self-pay | Admitting: Family Medicine

## 2019-04-07 ENCOUNTER — Encounter (INDEPENDENT_AMBULATORY_CARE_PROVIDER_SITE_OTHER): Payer: Self-pay | Admitting: Family Medicine

## 2019-04-07 DIAGNOSIS — E559 Vitamin D deficiency, unspecified: Secondary | ICD-10-CM | POA: Diagnosis not present

## 2019-04-07 DIAGNOSIS — Z6839 Body mass index (BMI) 39.0-39.9, adult: Secondary | ICD-10-CM

## 2019-04-07 DIAGNOSIS — I10 Essential (primary) hypertension: Secondary | ICD-10-CM | POA: Diagnosis not present

## 2019-04-07 MED ORDER — VITAMIN D (ERGOCALCIFEROL) 1.25 MG (50000 UNIT) PO CAPS: 50000.0000 [IU] | ORAL_CAPSULE | ORAL | 0 refills | Status: DC

## 2019-04-11 NOTE — Progress Notes (Signed)
Office: [phone]  /  Fax: [phone] TeleHealth Visit:  John Morrow has verbally consented to this TeleHealth visit today. The patient is located at home, the provider is located at the [facility] office. The participants in this visit include the listed provider, patient, spouse John Morrow and any and all parties involved. The visit was conducted today via [email or web address].  HPI:   Chief Complaint: OBESITY John Morrow is here to discuss his progress with his obesity treatment plan. He is on the Category 3 plan and is following his eating plan approximately 80 % of the time. He states he is walking 20 minutes 3 times per week. John Morrow continues to do well with his Category 3 plan, and hunger is controlled. His wife does the cooking, and sometimes he deviates from the plan, but he has still lost one pound since our last visit. We were unable to weigh the patient today for this TeleHealth visit. He feels as if he has lost weight since his last visit. He has lost 8 lbs since starting treatment with [name].  Vitamin D deficiency John Morrow has a diagnosis of vitamin D deficiency. John Morrow is stable on vit D, but he is not yet at goal. He denies nausea, vomiting or muscle weakness.  Hypertension [name] is a 68 y.o. male with hypertension. His blood pressure at home was 120/80 today, which is average per patient. John Morrow is doing well on medications and diet. John Morrow denies chest pain, headache or dizziness. He is working weight loss to help control his blood pressure with the goal of decreasing his risk of heart attack and stroke. John Morrow blood pressure is currently controlled.  ASSESSMENT AND PLAN:  Vitamin D deficiency - Plan: Vitamin D, Ergocalciferol, (DRISDOL) 1.25 MG (50000 UT) CAPS capsule  Essential hypertension  Class 2 severe obesity with serious comorbidity and body mass index (BMI) of 39.0 to 39.9 in adult, unspecified obesity type ([place])  PLAN:  Vitamin D  Deficiency John Morrow was informed that low vitamin D levels contributes to fatigue and are associated with obesity, breast, and colon cancer. He agrees to continue to take prescription Vit D @50 ,000 IU every week #4 with no refills and will follow up for routine testing of vitamin D, at least 2-3 times per year. He was informed of the risk of over-replacement of vitamin D and agrees to not increase his dose unless he discusses this with [name] first. John Morrow agrees to follow up as directed.  Hypertension We discussed sodium restriction, working on healthy weight loss, and a regular exercise program as the means to achieve improved blood pressure control. Taten agreed with this plan and agreed to follow up as directed. We will continue to monitor his blood pressure as well as his progress with the above lifestyle modifications. He will continue Lisinopril-HCTZ and will watch for signs of hypotension as he continues his lifestyle modifications.  Obesity John Morrow is currently in the action stage of change. As such, his goal is to continue with weight loss efforts He has agreed to follow the Category 3 plan John Morrow has been instructed to work up to a goal of 150 minutes of combined cardio and strengthening exercise per week for weight loss and overall health benefits. We discussed the following Behavioral Modification Strategies today: increase H2O intake, work on meal planning and easy cooking plans and ways to avoid boredom eating  John Morrow has agreed to follow up with our clinic in 2 weeks. He was informed of the importance of frequent follow  up visits to maximize his success with intensive lifestyle modifications for his multiple health conditions.  ALLERGIES: Allergies  Allergen Reactions   Morphine And Related     Rash, vomiting, itching     MEDICATIONS: Current Outpatient Medications on File Prior to Visit  Medication Sig Dispense Refill   atorvastatin (LIPITOR) 40 MG tablet Take 1 tablet (40 mg  total) by mouth daily. (Patient taking differently: Take 20 mg by mouth daily. ) 90 tablet 3   buPROPion (WELLBUTRIN XL) 300 MG 24 hr tablet Take 1 tablet by mouth once daily 90 tablet 0   diclofenac sodium (VOLTAREN) 1 % GEL Apply 4 g topically 4 (four) times daily. To affected joint. 100 g 11   lisinopril-hydrochlorothiazide (ZESTORETIC) 20-25 MG tablet Take 1 tablet by mouth once daily 90 tablet 1   meloxicam (MOBIC) 7.5 MG tablet Take 7.5 mg by mouth daily.     tamsulosin (FLOMAX) 0.4 MG CAPS capsule Take 1 capsule (0.4 mg total) by mouth daily. 90 capsule 3   No current facility-administered medications on file prior to visit.     PAST MEDICAL HISTORY: Past Medical History:  Diagnosis Date   Back pain    Diverticulosis of colon without hemorrhage 06/12/2016   On Colonoscopy 11/06/2010   HLD (hyperlipidemia) 05/29/2016   Hyperlipemia    Hypertension    Joint pain    Morbid obesity ([place]) 05/29/2016   Prediabetes    Right hip pain    needs hip replacement   Sleep apnea     PAST SURGICAL HISTORY: Past Surgical History:  Procedure Laterality Date   KNEE SURGERY      SOCIAL HISTORY: Social History   Tobacco Use   Smoking status: Never Smoker   Smokeless tobacco: Never Used  Substance Use Topics   Alcohol use: Yes    Comment: occassionally   Drug use: No    FAMILY HISTORY: Family History  Problem Relation Age of Onset   Lung disease Mother    Hypertension Mother    Lung disease Father    Alcoholism Father     ROS: Review of Systems  Constitutional: Positive for weight loss.  Cardiovascular: Negative for chest pain.  Gastrointestinal: Negative for nausea and vomiting.  Musculoskeletal:       Negative for muscle weakness  Neurological: Negative for dizziness and headaches.    PHYSICAL EXAM: Pt in no acute distress  RECENT LABS AND TESTS: BMET    Component Value Date/Time   NA 142 12/21/2018 0744   NA 144 09/27/2018 0755   K 3.6  12/21/2018 0744   CL 103 12/21/2018 0744   CO2 32 12/21/2018 0744   GLUCOSE 96 12/21/2018 0744   BUN 26 (H) 12/21/2018 0744   BUN 28 (H) 09/27/2018 0755   CREATININE 1.22 12/21/2018 0744   CALCIUM 9.4 12/21/2018 0744   GFRNONAA 52 (L) 10/25/2018 0759   GFRAA 61 10/25/2018 0759   Lab Results  Component Value Date   HGBA1C 5.4 09/27/2018   HGBA1C 5.4 06/05/2016   Lab Results  Component Value Date   INSULIN 17.7 09/27/2018   CBC    Component Value Date/Time   WBC 5.7 09/27/2018 0755   WBC 10.1 04/05/2018 0929   RBC 4.95 09/27/2018 0755   RBC 5.33 04/05/2018 0929   HGB 15.0 09/27/2018 0755   HCT 44.4 09/27/2018 0755   PLT 301 04/05/2018 0929   MCV 90 09/27/2018 0755   MCH 30.3 09/27/2018 0755   MCH 28.5 04/05/2018 0929  MCHC 33.8 09/27/2018 0755   MCHC 33.6 04/05/2018 0929   RDW 11.9 09/27/2018 0755   LYMPHSABS 1.3 09/27/2018 0755   EOSABS 0.1 09/27/2018 0755   BASOSABS 0.0 09/27/2018 0755   Iron/TIBC/Ferritin/ %Sat No results found for: IRON, TIBC, FERRITIN, IRONPCTSAT Lipid Panel     Component Value Date/Time   CHOL 113 09/27/2018 0755   TRIG 95 09/27/2018 0755   HDL 43 09/27/2018 0755   CHOLHDL 3.0 03/17/2018 0954   VLDL 19 06/05/2016 0827   LDLCALC 51 09/27/2018 0755   LDLCALC 62 03/17/2018 0954   Hepatic Function Panel     Component Value Date/Time   PROT 6.7 10/25/2018 0759   PROT 6.8 09/27/2018 0755   ALBUMIN 4.5 09/27/2018 0755   AST 18 10/25/2018 0759   ALT 17 10/25/2018 0759   ALKPHOS 61 09/27/2018 0755   BILITOT 0.7 10/25/2018 0759   BILITOT 0.7 09/27/2018 0755      Component Value Date/Time   TSH 1.040 09/27/2018 0755   TSH 1.18 06/05/2016 0827     Ref. Range 09/27/2018 07:55  Vitamin D, 25-Hydroxy Latest Ref Range: 30.0 - 100.0 ng/mL 33.7    I, Doreene Nest, am acting as Location manager for Dennard Nip, MD I have reviewed the above documentation for accuracy and completeness, and I agree with the above. -Dennard Nip, MD

## 2019-04-13 DIAGNOSIS — M19012 Primary osteoarthritis, left shoulder: Secondary | ICD-10-CM | POA: Insufficient documentation

## 2019-04-13 DIAGNOSIS — M19011 Primary osteoarthritis, right shoulder: Secondary | ICD-10-CM | POA: Insufficient documentation

## 2019-04-25 ENCOUNTER — Ambulatory Visit (INDEPENDENT_AMBULATORY_CARE_PROVIDER_SITE_OTHER): Payer: Medicare Other | Admitting: Family Medicine

## 2019-04-26 ENCOUNTER — Other Ambulatory Visit: Payer: Self-pay | Admitting: Family Medicine

## 2019-04-26 ENCOUNTER — Encounter: Payer: Self-pay | Admitting: Family Medicine

## 2019-04-27 ENCOUNTER — Other Ambulatory Visit: Payer: Self-pay

## 2019-04-27 ENCOUNTER — Encounter (INDEPENDENT_AMBULATORY_CARE_PROVIDER_SITE_OTHER): Payer: Self-pay | Admitting: Family Medicine

## 2019-04-27 ENCOUNTER — Telehealth (INDEPENDENT_AMBULATORY_CARE_PROVIDER_SITE_OTHER): Payer: Medicare Other | Admitting: Family Medicine

## 2019-04-27 DIAGNOSIS — E8881 Metabolic syndrome: Secondary | ICD-10-CM | POA: Diagnosis not present

## 2019-04-27 DIAGNOSIS — Z6839 Body mass index (BMI) 39.0-39.9, adult: Secondary | ICD-10-CM

## 2019-04-27 DIAGNOSIS — E559 Vitamin D deficiency, unspecified: Secondary | ICD-10-CM

## 2019-05-02 NOTE — Progress Notes (Signed)
Office: [phone]  /  Fax: [phone] TeleHealth Visit:  John Morrow has verbally consented to this TeleHealth visit today. The patient is located at home, the provider is located at the [facility] office. The participants in this visit include the listed provider, patient, and the patient's wife, John Morrow. The visit was conducted today via [email or web address].  HPI:   Chief Complaint: OBESITY John Morrow is here to discuss his progress with his obesity treatment plan. He is on the Category 3 plan and is following his eating plan approximately 70% of the time. He states he is walking 30 minutes 3 times per week. John Morrow states he has done well maintaining his weight since his last visit. Hunger is controlled and he has added walking most days when it is not raining. He is happy with his plan and is working on decreasing emotional eating. We were unable to weigh the patient today for this TeleHealth visit. He feels as if he has maintained his weight since his last visit. He has lost 8 lbs since starting treatment with [name].  Insulin Resistance John Morrow has a diagnosis of insulin resistance based on his elevated fasting insulin level >5. Although John Morrow's blood glucose readings are still under good control, insulin resistance puts him at greater risk of metabolic syndrome and diabetes. He is not taking metformin currently and continues to work on diet and exercise to decrease risk of diabetes. He reports no hypoglycemia and notes a decrease in polyphagia on his diet prescription. John Morrow is due to have labs.  Vitamin D deficiency John Morrow has a diagnosis of Vitamin D deficiency. He is currently stable on Vit D and denies nausea, vomiting or muscle weakness. He is due for labs soon.  ASSESSMENT AND PLAN:  Vitamin D deficiency - Plan: VITAMIN D 25 Hydroxy (Vit-D Deficiency, Fractures)  Insulin resistance - Plan: Comprehensive metabolic panel, Insulin, random, Hemoglobin A1c  Class 2 severe obesity  with serious comorbidity and body mass index (BMI) of 39.0 to 39.9 in adult, unspecified obesity type ([place])  PLAN:  Insulin Resistance John Morrow will continue to work on weight loss, exercise, and decreasing simple carbohydrates in his diet to help decrease the risk of diabetes. We dicussed metformin including benefits and risks. He was informed that eating too many simple carbohydrates or too many calories at one sitting increases the likelihood of GI side effects. John Morrow will continue his diet prescription and will have labs checked.  Vitamin D Deficiency John Morrow was informed that low Vitamin D levels contributes to fatigue and are associated with obesity, breast, and colon cancer. He will have routine testing of Vitamin D and agrees to follow-up with our clinic in 3 weeks.  I spent > than 50% of the 25 minute visit on counseling as documented in the note.  Obesity John Morrow is currently in the action stage of change. As such, his goal is to continue with weight loss efforts. He has agreed to follow the Category 3 plan. John Morrow has been instructed to work up to a goal of 150 minutes of combined cardio and strengthening exercise per week for weight loss and overall health benefits. We discussed the following Behavioral Modification Strategies today: increasing lean protein intake, decreasing simple carbohydrates, work on meal planning and easy cooking plans.  John Morrow has agreed to follow-up with our clinic in 3 weeks. He was informed of the importance of frequent follow-up visits to maximize his success with intensive lifestyle modifications for his multiple health conditions.  ALLERGIES: Allergies  Allergen Reactions  .  Morphine And Related     Rash, vomiting, itching     MEDICATIONS: Current Outpatient Medications on File Prior to Visit  Medication Sig Dispense Refill  . atorvastatin (LIPITOR) 40 MG tablet Take 1 tablet (40 mg total) by mouth daily. (Patient taking differently: Take 20 mg  by mouth daily. ) 90 tablet 3  . buPROPion (WELLBUTRIN XL) 300 MG 24 hr tablet Take 1 tablet by mouth once daily 90 tablet 0  . diclofenac sodium (VOLTAREN) 1 % GEL Apply 4 g topically 4 (four) times daily. To affected joint. 100 g 11  . lisinopril-hydrochlorothiazide (ZESTORETIC) 20-25 MG tablet Take 1 tablet by mouth once daily 90 tablet 1  . meloxicam (MOBIC) 7.5 MG tablet Take 7.5 mg by mouth daily.    . tamsulosin (FLOMAX) 0.4 MG CAPS capsule Take 1 capsule by mouth once daily 90 capsule 0  . Vitamin D, Ergocalciferol, (DRISDOL) 1.25 MG (50000 UT) CAPS capsule Take 1 capsule (50,000 Units total) by mouth every 7 (seven) days. 4 capsule 0   No current facility-administered medications on file prior to visit.     PAST MEDICAL HISTORY: Past Medical History:  Diagnosis Date  . Back pain   . Diverticulosis of colon without hemorrhage 06/12/2016   On Colonoscopy 11/06/2010  . HLD (hyperlipidemia) 05/29/2016  . Hyperlipemia   . Hypertension   . Joint pain   . Morbid obesity ([place]) 05/29/2016  . Prediabetes   . Right hip pain    needs hip replacement  . Sleep apnea     PAST SURGICAL HISTORY: Past Surgical History:  Procedure Laterality Date  . KNEE SURGERY      SOCIAL HISTORY: Social History   Tobacco Use  . Smoking status: Never Smoker  . Smokeless tobacco: Never Used  Substance Use Topics  . Alcohol use: Yes    Comment: occassionally  . Drug use: No    FAMILY HISTORY: Family History  Problem Relation Age of Onset  . Lung disease Mother   . Hypertension Mother   . Lung disease Father   . Alcoholism Father    ROS: Review of Systems  Gastrointestinal: Negative for nausea and vomiting.  Musculoskeletal:       Negative for muscle weakness.  Endo/Heme/Allergies:       Negative for hypoglycemia. Positive for decreased polyphagia on diet prescription.   PHYSICAL EXAM: Pt in no acute distress  RECENT LABS AND TESTS: BMET    Component Value Date/Time   NA 142  12/21/2018 0744   NA 144 09/27/2018 0755   K 3.6 12/21/2018 0744   CL 103 12/21/2018 0744   CO2 32 12/21/2018 0744   GLUCOSE 96 12/21/2018 0744   BUN 26 (H) 12/21/2018 0744   BUN 28 (H) 09/27/2018 0755   CREATININE 1.22 12/21/2018 0744   CALCIUM 9.4 12/21/2018 0744   GFRNONAA 52 (L) 10/25/2018 0759   GFRAA 61 10/25/2018 0759   Lab Results  Component Value Date   HGBA1C 5.4 09/27/2018   HGBA1C 5.4 06/05/2016   Lab Results  Component Value Date   INSULIN 17.7 09/27/2018   CBC    Component Value Date/Time   WBC 5.7 09/27/2018 0755   WBC 10.1 04/05/2018 0929   RBC 4.95 09/27/2018 0755   RBC 5.33 04/05/2018 0929   HGB 15.0 09/27/2018 0755   HCT 44.4 09/27/2018 0755   PLT 301 04/05/2018 0929   MCV 90 09/27/2018 0755   MCH 30.3 09/27/2018 0755   MCH 28.5 04/05/2018 0929  MCHC 33.8 09/27/2018 0755   MCHC 33.6 04/05/2018 0929   RDW 11.9 09/27/2018 0755   LYMPHSABS 1.3 09/27/2018 0755   EOSABS 0.1 09/27/2018 0755   BASOSABS 0.0 09/27/2018 0755   Iron/TIBC/Ferritin/ %Sat No results found for: IRON, TIBC, FERRITIN, IRONPCTSAT Lipid Panel     Component Value Date/Time   CHOL 113 09/27/2018 0755   TRIG 95 09/27/2018 0755   HDL 43 09/27/2018 0755   CHOLHDL 3.0 03/17/2018 0954   VLDL 19 06/05/2016 0827   LDLCALC 51 09/27/2018 0755   LDLCALC 62 03/17/2018 0954   Hepatic Function Panel     Component Value Date/Time   PROT 6.7 10/25/2018 0759   PROT 6.8 09/27/2018 0755   ALBUMIN 4.5 09/27/2018 0755   AST 18 10/25/2018 0759   ALT 17 10/25/2018 0759   ALKPHOS 61 09/27/2018 0755   BILITOT 0.7 10/25/2018 0759   BILITOT 0.7 09/27/2018 0755      Component Value Date/Time   TSH 1.040 09/27/2018 0755   TSH 1.18 06/05/2016 0827   Results for LEOCADIO, HEAL "BOB" (MRN [id number]) as of 05/02/2019 16:17  Ref. Range 09/27/2018 07:55  Vitamin D, 25-Hydroxy Latest Ref Range: 30.0 - 100.0 ng/mL 33.7   I, Michaelene Song, am acting as Location manager for Dennard Nip, MD I have  reviewed the above documentation for accuracy and completeness, and I agree with the above. -Dennard Nip, MD

## 2019-05-05 ENCOUNTER — Other Ambulatory Visit (INDEPENDENT_AMBULATORY_CARE_PROVIDER_SITE_OTHER): Payer: Self-pay

## 2019-05-05 ENCOUNTER — Encounter (INDEPENDENT_AMBULATORY_CARE_PROVIDER_SITE_OTHER): Payer: Self-pay | Admitting: Family Medicine

## 2019-05-05 DIAGNOSIS — E559 Vitamin D deficiency, unspecified: Secondary | ICD-10-CM

## 2019-05-05 DIAGNOSIS — E8881 Metabolic syndrome: Secondary | ICD-10-CM

## 2019-05-06 ENCOUNTER — Other Ambulatory Visit (INDEPENDENT_AMBULATORY_CARE_PROVIDER_SITE_OTHER): Payer: Self-pay | Admitting: Family Medicine

## 2019-05-09 LAB — INSULIN, RANDOM: Insulin: 7.4 u[IU]/mL

## 2019-05-09 LAB — COMPREHENSIVE METABOLIC PANEL
AG Ratio: 1.7 (calc) (ref 1.0–2.5)
ALT: 22 U/L (ref 9–46)
AST: 15 U/L (ref 10–35)
Albumin: 3.8 g/dL (ref 3.6–5.1)
Alkaline phosphatase (APISO): 58 U/L (ref 35–144)
BUN: 24 mg/dL (ref 7–25)
CO2: 29 mmol/L (ref 20–32)
Calcium: 9.3 mg/dL (ref 8.6–10.3)
Chloride: 106 mmol/L (ref 98–110)
Creat: 1.25 mg/dL (ref 0.70–1.25)
Globulin: 2.3 g/dL (calc) (ref 1.9–3.7)
Glucose, Bld: 98 mg/dL (ref 65–99)
Potassium: 4.2 mmol/L (ref 3.5–5.3)
Sodium: 142 mmol/L (ref 135–146)
Total Bilirubin: 0.7 mg/dL (ref 0.2–1.2)
Total Protein: 6.1 g/dL (ref 6.1–8.1)

## 2019-05-09 LAB — HEMOGLOBIN A1C
Hgb A1c MFr Bld: 5.5 % of total Hgb (ref <5.7)
Mean Plasma Glucose: 111 (calc)
eAG (mmol/L): 6.2 (calc)

## 2019-05-09 LAB — VITAMIN D 25 HYDROXY (VIT D DEFICIENCY, FRACTURES): Vit D, 25-Hydroxy: 56 ng/mL (ref 30–100)

## 2019-05-16 ENCOUNTER — Ambulatory Visit (INDEPENDENT_AMBULATORY_CARE_PROVIDER_SITE_OTHER): Payer: Medicare Other | Admitting: Family Medicine

## 2019-05-16 ENCOUNTER — Encounter (INDEPENDENT_AMBULATORY_CARE_PROVIDER_SITE_OTHER): Payer: Self-pay | Admitting: Family Medicine

## 2019-05-16 ENCOUNTER — Other Ambulatory Visit: Payer: Self-pay

## 2019-05-16 VITALS — BP 118/80 | HR 79 | Temp 98.2°F | Ht 69.0 in | Wt 268.0 lb

## 2019-05-16 DIAGNOSIS — E559 Vitamin D deficiency, unspecified: Secondary | ICD-10-CM | POA: Diagnosis not present

## 2019-05-16 DIAGNOSIS — Z6839 Body mass index (BMI) 39.0-39.9, adult: Secondary | ICD-10-CM

## 2019-05-16 MED ORDER — VITAMIN D (ERGOCALCIFEROL) 1.25 MG (50000 UNIT) PO CAPS: 50000.0000 [IU] | ORAL_CAPSULE | ORAL | 0 refills | Status: DC

## 2019-05-16 NOTE — Progress Notes (Signed)
Office: [phone]  /  Fax: [phone]   HPI:   Chief Complaint: OBESITY John Morrow is here to discuss his progress with his obesity treatment plan. He is on the Category 3 plan and is following his eating plan approximately 75 to 80 % of the time. He states he is walking 30 minutes 3 to 4 times per week. John Morrow continues to do well with his Category 3 plan. He continues to lose weight and his hunger is controlled. He is happy with his meal plan and he doesn't want to make changes. His weight is 268 lb (121.6 kg) today and has had a weight loss of 1 pound since his last in-office visit. He has lost 9 lbs since starting treatment with [name].  Vitamin D deficiency John Morrow has a diagnosis of vitamin D deficiency. He is currently stable on vit D and his last level was at goal. John Morrow denies nausea, vomiting or muscle weakness.  ASSESSMENT AND PLAN:  Vitamin D deficiency - Plan: Vitamin D, Ergocalciferol, (DRISDOL) 1.25 MG (50000 UT) CAPS capsule  Class 2 severe obesity with serious comorbidity and body mass index (BMI) of 39.0 to 39.9 in adult, unspecified obesity type ([place])  PLAN:  Vitamin D Deficiency John Morrow was informed that low vitamin D levels contributes to fatigue and are associated with obesity, breast, and colon cancer. He agrees to continue to take prescription Vit D @50 ,000 IU every week #4 with no refills and will follow up for routine testing of vitamin D, at least 2-3 times per year. He was informed of the risk of over-replacement of vitamin D and agrees to not increase his dose unless he discusses this with [name] first. We will recheck labs in 3 months and John Morrow agrees to follow up as directed.  Obesity John Morrow is currently in the action stage of change. As such, his goal is to continue with weight loss efforts He has agreed to follow the Category 3 plan John Morrow has been instructed to work up to a goal of 150 minutes of combined cardio and strengthening exercise per week for weight  loss and overall health benefits. We discussed the following Behavioral Modification Strategies today: increasing lean protein intake, decreasing simple carbohydrates  and work on meal planning and easy cooking plans  John Morrow has agreed to follow up with our clinic in 3 weeks. He was informed of the importance of frequent follow up visits to maximize his success with intensive lifestyle modifications for his multiple health conditions.  ALLERGIES: Allergies  Allergen Reactions  . Morphine And Related     Rash, vomiting, itching     MEDICATIONS: Current Outpatient Medications on File Prior to Visit  Medication Sig Dispense Refill  . atorvastatin (LIPITOR) 40 MG tablet Take 1 tablet (40 mg total) by mouth daily. (Patient taking differently: Take 20 mg by mouth daily. ) 90 tablet 3  . buPROPion (WELLBUTRIN XL) 300 MG 24 hr tablet Take 1 tablet by mouth once daily 90 tablet 0  . diclofenac sodium (VOLTAREN) 1 % GEL Apply 4 g topically 4 (four) times daily. To affected joint. 100 g 11  . lisinopril-hydrochlorothiazide (ZESTORETIC) 20-25 MG tablet Take 1 tablet by mouth once daily 90 tablet 1  . meloxicam (MOBIC) 7.5 MG tablet Take 7.5 mg by mouth daily.    . tamsulosin (FLOMAX) 0.4 MG CAPS capsule Take 1 capsule by mouth once daily 90 capsule 0   No current facility-administered medications on file prior to visit.     PAST MEDICAL HISTORY:  Past Medical History:  Diagnosis Date  . Back pain   . Diverticulosis of colon without hemorrhage 06/12/2016   On Colonoscopy 11/06/2010  . HLD (hyperlipidemia) 05/29/2016  . Hyperlipemia   . Hypertension   . Joint pain   . Morbid obesity ([place]) 05/29/2016  . Prediabetes   . Right hip pain    needs hip replacement  . Sleep apnea     PAST SURGICAL HISTORY: Past Surgical History:  Procedure Laterality Date  . KNEE SURGERY      SOCIAL HISTORY: Social History   Tobacco Use  . Smoking status: Never Smoker  . Smokeless tobacco: Never Used   Substance Use Topics  . Alcohol use: Yes    Comment: occassionally  . Drug use: No    FAMILY HISTORY: Family History  Problem Relation Age of Onset  . Lung disease Mother   . Hypertension Mother   . Lung disease Father   . Alcoholism Father     ROS: Review of Systems  Constitutional: Positive for weight loss.  Gastrointestinal: Negative for nausea and vomiting.  Musculoskeletal:       Negative for muscle weakness    PHYSICAL EXAM: Blood pressure 118/80, pulse 79, temperature 98.2 F (36.8 C), temperature source Oral, height 5\' 9"  (1.753 m), weight 268 lb (121.6 kg), SpO2 97 %. Body mass index is 39.58 kg/m. Physical Exam Vitals signs reviewed.  Constitutional:      Appearance: Normal appearance. He is well-developed. He is obese.  Cardiovascular:     Rate and Rhythm: Normal rate.  Pulmonary:     Effort: Pulmonary effort is normal.  Musculoskeletal: Normal range of motion.  Skin:    General: Skin is warm and dry.  Neurological:     Mental Status: He is alert and oriented to person, place, and time.  Psychiatric:        Mood and Affect: Mood normal.        Behavior: Behavior normal.     RECENT LABS AND TESTS: BMET    Component Value Date/Time   NA 142 05/06/2019 0832   NA 144 09/27/2018 0755   K 4.2 05/06/2019 0832   CL 106 05/06/2019 0832   CO2 29 05/06/2019 0832   GLUCOSE 98 05/06/2019 0832   BUN 24 05/06/2019 0832   BUN 28 (H) 09/27/2018 0755   CREATININE 1.25 05/06/2019 0832   CALCIUM 9.3 05/06/2019 0832   GFRNONAA 52 (L) 10/25/2018 0759   GFRAA 61 10/25/2018 0759   Lab Results  Component Value Date   HGBA1C 5.5 05/06/2019   HGBA1C 5.4 09/27/2018   HGBA1C 5.4 06/05/2016   Lab Results  Component Value Date   INSULIN 17.7 09/27/2018   CBC    Component Value Date/Time   WBC 5.7 09/27/2018 0755   WBC 10.1 04/05/2018 0929   RBC 4.95 09/27/2018 0755   RBC 5.33 04/05/2018 0929   HGB 15.0 09/27/2018 0755   HCT 44.4 09/27/2018 0755   PLT  301 04/05/2018 0929   MCV 90 09/27/2018 0755   MCH 30.3 09/27/2018 0755   MCH 28.5 04/05/2018 0929   MCHC 33.8 09/27/2018 0755   MCHC 33.6 04/05/2018 0929   RDW 11.9 09/27/2018 0755   LYMPHSABS 1.3 09/27/2018 0755   EOSABS 0.1 09/27/2018 0755   BASOSABS 0.0 09/27/2018 0755   Iron/TIBC/Ferritin/ %Sat No results found for: IRON, TIBC, FERRITIN, IRONPCTSAT Lipid Panel     Component Value Date/Time   CHOL 113 09/27/2018 0755   TRIG 95 09/27/2018 0755  HDL 43 09/27/2018 0755   CHOLHDL 3.0 03/17/2018 0954   VLDL 19 06/05/2016 0827   LDLCALC 51 09/27/2018 0755   LDLCALC 62 03/17/2018 0954   Hepatic Function Panel     Component Value Date/Time   PROT 6.1 05/06/2019 0832   PROT 6.8 09/27/2018 0755   ALBUMIN 4.5 09/27/2018 0755   AST 15 05/06/2019 0832   ALT 22 05/06/2019 0832   ALKPHOS 61 09/27/2018 0755   BILITOT 0.7 05/06/2019 0832   BILITOT 0.7 09/27/2018 0755      Component Value Date/Time   TSH 1.040 09/27/2018 0755   TSH 1.18 06/05/2016 0827     Ref. Range 05/06/2019 08:32  Vitamin D, 25-Hydroxy Latest Ref Range: 30 - 100 ng/mL 56    OBESITY BEHAVIORAL INTERVENTION VISIT  Today's visit was # 14   Starting weight: 277 lbs Starting date: 09/23/2018 Today's weight : 268 lbs Today's date: 05/16/2019 Total lbs lost to date: 9    05/16/2019  Height 5\' 9"  (1.753 m)  Weight 268 lb (121.6 kg)  BMI (Calculated) 39.56  BLOOD PRESSURE - SYSTOLIC [id number]  BLOOD PRESSURE - DIASTOLIC 80   Body Fat % [id number] %  Total Body Water (lbs) 125.6 lbs    ASK: We discussed the diagnosis of obesity with [name] today and Laik agreed to give [name] permission to discuss obesity behavioral modification therapy today.  ASSESS: Pershing has the diagnosis of obesity and his BMI today is 39.56 Leum is in the action stage of change   ADVISE: Theodric was educated on the multiple health risks of obesity as well as the benefit of weight loss to improve his health. He was advised of the  need for long term treatment and the importance of lifestyle modifications to improve his current health and to decrease his risk of future health problems.  AGREE: Multiple dietary modification options and treatment options were discussed and  Whelan agreed to follow the recommendations documented in the above note.  ARRANGE: Teaghan was educated on the importance of frequent visits to treat obesity as outlined per CMS and USPSTF guidelines and agreed to schedule his next follow up appointment today.  I, Doreene Nest, am acting as transcriptionist for Dennard Nip, MD I have reviewed the above documentation for accuracy and completeness, and I agree with the above. -Dennard Nip, MD

## 2019-05-17 ENCOUNTER — Other Ambulatory Visit: Payer: Self-pay | Admitting: Family Medicine

## 2019-05-19 ENCOUNTER — Other Ambulatory Visit: Payer: Self-pay | Admitting: Family Medicine

## 2019-06-06 ENCOUNTER — Ambulatory Visit (INDEPENDENT_AMBULATORY_CARE_PROVIDER_SITE_OTHER): Payer: Medicare Other | Admitting: Family Medicine

## 2019-06-16 ENCOUNTER — Ambulatory Visit (INDEPENDENT_AMBULATORY_CARE_PROVIDER_SITE_OTHER): Payer: Medicare Other | Admitting: Family Medicine

## 2019-06-16 ENCOUNTER — Other Ambulatory Visit: Payer: Self-pay

## 2019-06-16 ENCOUNTER — Encounter (INDEPENDENT_AMBULATORY_CARE_PROVIDER_SITE_OTHER): Payer: Self-pay | Admitting: Family Medicine

## 2019-06-16 VITALS — BP 125/82 | HR 79 | Temp 98.2°F | Ht 69.0 in | Wt 270.0 lb

## 2019-06-16 DIAGNOSIS — E559 Vitamin D deficiency, unspecified: Secondary | ICD-10-CM | POA: Diagnosis not present

## 2019-06-16 DIAGNOSIS — I10 Essential (primary) hypertension: Secondary | ICD-10-CM | POA: Diagnosis not present

## 2019-06-16 DIAGNOSIS — Z6841 Body Mass Index (BMI) 40.0 and over, adult: Secondary | ICD-10-CM | POA: Diagnosis not present

## 2019-06-16 MED ORDER — VITAMIN D (ERGOCALCIFEROL) 1.25 MG (50000 UNIT) PO CAPS: 50000.0000 [IU] | ORAL_CAPSULE | ORAL | 0 refills | Status: DC

## 2019-06-20 ENCOUNTER — Ambulatory Visit: Payer: Medicare Other | Admitting: Family Medicine

## 2019-06-20 NOTE — Progress Notes (Signed)
Office: [phone]  /  Fax: [phone]   HPI:   Chief Complaint: OBESITY John Morrow is here to discuss his progress with his obesity treatment plan. He is on the Category 3 plan and is following his eating plan approximately 50 % of the time. He states he is walking 30 minutes 3 times per week. [name] did some celebration eating, with family visiting. He states he is ready to get back on track with his eating plan. His weight is 270 lb (122.5 kg) today and has had a weight gain of 2 pounds over a period of 4 weeks since his last visit. He has lost 7 lbs since starting treatment with [name].  Vitamin D deficiency [name] has a diagnosis of vitamin D deficiency. [name] is stable on vit D and he denies nausea, vomiting or muscle weakness.  Hypertension [name] is a 68 y.o. male with hypertension. [name] blood pressure is stable on medications. [name] denies chest pain, headache or dizziness. He is working weight loss to help control his blood pressure with the goal of decreasing his risk of heart attack and stroke.   ASSESSMENT AND PLAN:  Vitamin D deficiency - Plan: Vitamin D, Ergocalciferol, (DRISDOL) 1.25 MG (50000 UT) CAPS capsule  Essential hypertension  Class 3 severe obesity with serious comorbidity and body mass index (BMI) of 40.0 to 44.9 in adult, unspecified obesity type ([place])  PLAN:  Vitamin D Deficiency [name] was informed that low vitamin D levels contributes to fatigue and are associated with obesity, breast, and colon cancer. John Morrow agrees to continue to take prescription Vit D @50 ,000 IU every week #4 with no refills and he will follow up for routine testing of vitamin D, at least 2-3 times per year. He was informed of the risk of over-replacement of vitamin D and agrees to not increase his dose unless he discusses this with [name] first. We will recheck labs in 1 month and John Morrow agrees to follow up as directed.  Hypertension We discussed sodium restriction,  working on healthy weight loss, and a regular exercise program as the means to achieve improved blood pressure control. [name] agreed with this plan and agreed to follow up as directed. We will continue to monitor his blood pressure as well as his progress with the above lifestyle modifications. He will continue his medications as prescribed and will watch for signs of hypotension as he continues his lifestyle modifications.  Obesity John Morrow is currently in the action stage of change. As such, his goal is to continue with weight loss efforts He has agreed to follow the Category 3 plan [name] has been instructed to work up to a goal of 150 minutes of combined cardio and strengthening exercise per week for weight loss and overall health benefits. We discussed the following Behavioral Modification Strategies today: increase H2O intake, increasing lean protein intake, decreasing simple carbohydrates, decreasing sodium intake and work on meal planning and easy cooking plans Shredded chicken soup recipes were given to patient today.  John Morrow has agreed to follow up with our clinic in 3 weeks. He was informed of the importance of frequent follow up visits to maximize his success with intensive lifestyle modifications for his multiple health conditions.  ALLERGIES: Allergies  Allergen Reactions  . Morphine And Related     Rash, vomiting, itching     MEDICATIONS: Current Outpatient Medications on File Prior to Visit  Medication Sig Dispense Refill  . atorvastatin (LIPITOR) 40 MG tablet Take 1 tablet (40 mg total) by  mouth daily. (Patient taking differently: Take 20 mg by mouth daily. ) 90 tablet 3  . buPROPion (WELLBUTRIN XL) 300 MG 24 hr tablet Take 1 tablet by mouth once daily 90 tablet 0  . diclofenac sodium (VOLTAREN) 1 % GEL Apply 4 g topically 4 (four) times daily. To affected joint. 100 g 11  . lisinopril-hydrochlorothiazide (ZESTORETIC) 20-25 MG tablet Take 1 tablet by mouth once daily 90  tablet 1  . meloxicam (MOBIC) 7.5 MG tablet Take 7.5 mg by mouth daily.    . tamsulosin (FLOMAX) 0.4 MG CAPS capsule Take 1 capsule by mouth once daily 90 capsule 0   No current facility-administered medications on file prior to visit.     PAST MEDICAL HISTORY: Past Medical History:  Diagnosis Date  . Back pain   . Diverticulosis of colon without hemorrhage 06/12/2016   On Colonoscopy 11/06/2010  . HLD (hyperlipidemia) 05/29/2016  . Hyperlipemia   . Hypertension   . Joint pain   . Morbid obesity ([place]) 05/29/2016  . Prediabetes   . Right hip pain    needs hip replacement  . Sleep apnea     PAST SURGICAL HISTORY: Past Surgical History:  Procedure Laterality Date  . KNEE SURGERY      SOCIAL HISTORY: Social History   Tobacco Use  . Smoking status: Never Smoker  . Smokeless tobacco: Never Used  Substance Use Topics  . Alcohol use: Yes    Comment: occassionally  . Drug use: No    FAMILY HISTORY: Family History  Problem Relation Age of Onset  . Lung disease Mother   . Hypertension Mother   . Lung disease Father   . Alcoholism Father     ROS: Review of Systems  Constitutional: Negative for weight loss.  Cardiovascular: Negative for chest pain.  Neurological: Negative for dizziness and headaches.    PHYSICAL EXAM: Blood pressure 125/82, pulse 79, temperature 98.2 F (36.8 C), temperature source Oral, height 5\' 9"  (1.753 m), weight 270 lb (122.5 kg), SpO2 96 %. Body mass index is 39.87 kg/m. Physical Exam Vitals signs reviewed.  Constitutional:      Appearance: Normal appearance. He is well-developed. He is obese.  Cardiovascular:     Rate and Rhythm: Normal rate.  Pulmonary:     Effort: Pulmonary effort is normal.  Musculoskeletal: Normal range of motion.  Skin:    General: Skin is warm and dry.  Neurological:     Mental Status: He is alert and oriented to person, place, and time.  Psychiatric:        Mood and Affect: Mood normal.        Behavior:  Behavior normal.     RECENT LABS AND TESTS: BMET    Component Value Date/Time   NA 142 05/06/2019 0832   NA 144 09/27/2018 0755   K 4.2 05/06/2019 0832   CL 106 05/06/2019 0832   CO2 29 05/06/2019 0832   GLUCOSE 98 05/06/2019 0832   BUN 24 05/06/2019 0832   BUN 28 (H) 09/27/2018 0755   CREATININE 1.25 05/06/2019 0832   CALCIUM 9.3 05/06/2019 0832   GFRNONAA 52 (L) 10/25/2018 0759   GFRAA 61 10/25/2018 0759   Lab Results  Component Value Date   HGBA1C 5.5 05/06/2019   HGBA1C 5.4 09/27/2018   HGBA1C 5.4 06/05/2016   Lab Results  Component Value Date   INSULIN 17.7 09/27/2018   CBC    Component Value Date/Time   WBC 5.7 09/27/2018 0755   WBC 10.1  04/05/2018 0929   RBC 4.95 09/27/2018 0755   RBC 5.33 04/05/2018 0929   HGB 15.0 09/27/2018 0755   HCT 44.4 09/27/2018 0755   PLT 301 04/05/2018 0929   MCV 90 09/27/2018 0755   MCH 30.3 09/27/2018 0755   MCH 28.5 04/05/2018 0929   MCHC 33.8 09/27/2018 0755   MCHC 33.6 04/05/2018 0929   RDW 11.9 09/27/2018 0755   LYMPHSABS 1.3 09/27/2018 0755   EOSABS 0.1 09/27/2018 0755   BASOSABS 0.0 09/27/2018 0755   Iron/TIBC/Ferritin/ %Sat No results found for: IRON, TIBC, FERRITIN, IRONPCTSAT Lipid Panel     Component Value Date/Time   CHOL 113 09/27/2018 0755   TRIG 95 09/27/2018 0755   HDL 43 09/27/2018 0755   CHOLHDL 3.0 03/17/2018 0954   VLDL 19 06/05/2016 0827   LDLCALC 51 09/27/2018 0755   LDLCALC 62 03/17/2018 0954   Hepatic Function Panel     Component Value Date/Time   PROT 6.1 05/06/2019 0832   PROT 6.8 09/27/2018 0755   ALBUMIN 4.5 09/27/2018 0755   AST 15 05/06/2019 0832   ALT 22 05/06/2019 0832   ALKPHOS 61 09/27/2018 0755   BILITOT 0.7 05/06/2019 0832   BILITOT 0.7 09/27/2018 0755      Component Value Date/Time   TSH 1.040 09/27/2018 0755   TSH 1.18 06/05/2016 0827     Ref. Range 05/06/2019 08:32  Vitamin D, 25-Hydroxy Latest Ref Range: 30 - 100 ng/mL 56   OBESITY BEHAVIORAL INTERVENTION  VISIT  Today's visit was # 15   Starting weight: 277 lbs Starting date: 09/23/2018 Today's weight : 270 lbs Today's date: 06/16/2019 Total lbs lost to date: 7    06/16/2019  Height 5\' 9"  (1.753 m)  Weight 270 lb (122.5 kg)  BMI (Calculated) 39.85  BLOOD PRESSURE - SYSTOLIC 0000000  BLOOD PRESSURE - DIASTOLIC 82   Body Fat % [id number] %  Total Body Water (lbs) 130.2 lbs    ASK: We discussed the diagnosis of obesity with [name] today and Ordean agreed to give [name] permission to discuss obesity behavioral modification therapy today.  ASSESS: Jamaine has the diagnosis of obesity and his BMI today is 39.85 Tyrez is in the action stage of change   ADVISE: Timotheus was educated on the multiple health risks of obesity as well as the benefit of weight loss to improve his health. He was advised of the need for long term treatment and the importance of lifestyle modifications to improve his current health and to decrease his risk of future health problems.  AGREE: Multiple dietary modification options and treatment options were discussed and  Anibal agreed to follow the recommendations documented in the above note.  ARRANGE: Apolonio was educated on the importance of frequent visits to treat obesity as outlined per CMS and USPSTF guidelines and agreed to schedule his next follow up appointment today.  I, Doreene Nest, am acting as transcriptionist for Dennard Nip, MD I have reviewed the above documentation for accuracy and completeness, and I agree with the above. -Dennard Nip, MD

## 2019-06-21 ENCOUNTER — Ambulatory Visit: Payer: Medicare Other | Admitting: Family Medicine

## 2019-06-21 ENCOUNTER — Other Ambulatory Visit: Payer: Self-pay

## 2019-06-21 ENCOUNTER — Encounter: Payer: Self-pay | Admitting: Family Medicine

## 2019-06-21 VITALS — BP 135/81 | HR 69 | Wt 278.0 lb

## 2019-06-21 DIAGNOSIS — I10 Essential (primary) hypertension: Secondary | ICD-10-CM

## 2019-06-21 DIAGNOSIS — E782 Mixed hyperlipidemia: Secondary | ICD-10-CM

## 2019-06-21 DIAGNOSIS — N400 Enlarged prostate without lower urinary tract symptoms: Secondary | ICD-10-CM

## 2019-06-21 DIAGNOSIS — Z6839 Body mass index (BMI) 39.0-39.9, adult: Secondary | ICD-10-CM

## 2019-06-21 NOTE — Progress Notes (Signed)
Markeese Jochem is a 68 y.o. male who presents to [facility] today for follow-up hypertension hyperlipidemia BPH.  [name] is doing reasonably well overall with his regimen.  He has been seen by Dr. Leafy Ro for weight management ongoing for several months now.  He has had multiple labs with her most recently metabolic panel in August.  Hyperlipidemia: Prescribed Lipitor 40 but currently only taking Lipitor 20.  His last lipid panel was in January with LDL at 54.  He tolerates the current dose quite well.  Hypertension: Taking lisinopril/hydrochlorothiazide and tolerating it well with no issues.  Patient takes Flomax for BPH and tolerates it well with no issues.  ROS as above:  Exam:  BP 135/81   Pulse 69   Wt 278 lb (126.1 kg)   BMI 41.05 kg/m  Wt Readings from Last 5 Encounters:  06/21/19 278 lb (126.1 kg)  06/16/19 270 lb (122.5 kg)  05/16/19 268 lb (121.6 kg)  01/26/19 274 lb (124.3 kg)  11/24/18 269 lb (122 kg)    Gen: Well NAD HEENT: EOMI,  MMM Lungs: Normal work of breathing. CTABL Heart: RRR no MRG Abd: NABS, Soft. Nondistended, Nontender Exts: Brisk capillary refill, warm and well perfused.   Lab and Radiology Results No results found for this or any previous visit (from the past 72 hour(s)). No results found.    Assessment and Plan: 68 y.o. male with  Hypertension: Blood pressure a little bit higher today than would like.  Plan for home blood pressure log and recheck as needed.  Continue current regimen.  Hyperlipidemia: Plan to recheck lipid panel in near future.  Will prescribe the dose of atorvastatin 20 mg size pills if lipid panel is at goal.  We will check PSA as well for BPH as patient has not had PSA checked in some time.  Continue current regimen otherwise.  Recheck as needed.  Recommend schedule with new PCP in about 4 to 5 months.   PDMP not reviewed this encounter. Orders Placed This Encounter  Procedures  . PSA  . Lipid Panel w/reflex Direct LDL   No orders of the defined types were placed in this encounter.    Historical information moved to improve visibility of documentation.  Past Medical History:  Diagnosis Date  . Back pain   . Diverticulosis of colon without hemorrhage 06/12/2016   On Colonoscopy 11/06/2010  . HLD (hyperlipidemia) 05/29/2016  . Hyperlipemia   . Hypertension   . Joint pain   . Morbid obesity ([place]) 05/29/2016  . Prediabetes   . Right hip pain    needs hip replacement  . Sleep apnea    Past Surgical History:  Procedure Laterality Date  . KNEE SURGERY     Social History   Tobacco Use  . Smoking status: Never Smoker  . Smokeless tobacco: Never Used  Substance Use Topics  . Alcohol use: Yes    Comment: occassionally   family history includes Alcoholism in his father; Hypertension in his mother; Lung disease in his father and mother.  Medications: Current Outpatient Medications  Medication Sig Dispense Refill  . atorvastatin (LIPITOR) 40 MG tablet Take 1 tablet (40 mg total) by mouth daily. (Patient taking differently: Take 20 mg by mouth daily. ) 90 tablet 3  . buPROPion (WELLBUTRIN XL) 300 MG 24 hr tablet Take 1 tablet by mouth once daily 90 tablet 0  . diclofenac sodium (VOLTAREN) 1 % GEL Apply 4  g topically 4 (four) times daily. To affected joint. 100 g 11  . lisinopril-hydrochlorothiazide (ZESTORETIC) 20-25 MG tablet Take 1 tablet by mouth once daily 90 tablet 1  . tamsulosin (FLOMAX) 0.4 MG CAPS capsule Take 1 capsule by mouth once daily 90 capsule 0  . Vitamin D, Ergocalciferol, (DRISDOL) 1.25 MG (50000 UT) CAPS capsule Take 1 capsule (50,000 Units total) by mouth every 7 (seven) days. 4 capsule 0   No current facility-administered medications for this visit.    Allergies  Allergen Reactions  . Morphine And Related     Rash, vomiting, itching      Discussed  warning signs or symptoms. Please see discharge instructions. Patient expresses understanding.

## 2019-06-21 NOTE — Patient Instructions (Addendum)
Thank you for coming in today.  I recommend you get PSA and Lipid panel checked with Dr Migdalia Dk labs next month.  I have oredered these in addition to what ever labs Dr Leafy Ro wants.   Recheck with Dr Zigmund Daniel in 5 months or so.  Set a reminder to schedule visit with him.

## 2019-07-07 ENCOUNTER — Ambulatory Visit (INDEPENDENT_AMBULATORY_CARE_PROVIDER_SITE_OTHER): Payer: Medicare Other | Admitting: Family Medicine

## 2019-07-11 ENCOUNTER — Encounter (INDEPENDENT_AMBULATORY_CARE_PROVIDER_SITE_OTHER): Payer: Self-pay | Admitting: Family Medicine

## 2019-07-11 NOTE — Telephone Encounter (Signed)
Please advise 

## 2019-07-15 ENCOUNTER — Other Ambulatory Visit: Payer: Self-pay | Admitting: Family Medicine

## 2019-07-21 ENCOUNTER — Ambulatory Visit (INDEPENDENT_AMBULATORY_CARE_PROVIDER_SITE_OTHER): Payer: Medicare Other | Admitting: Family Medicine

## 2019-07-21 ENCOUNTER — Other Ambulatory Visit: Payer: Self-pay

## 2019-07-21 ENCOUNTER — Encounter (INDEPENDENT_AMBULATORY_CARE_PROVIDER_SITE_OTHER): Payer: Self-pay | Admitting: Family Medicine

## 2019-07-21 VITALS — BP 122/79 | HR 75 | Temp 98.1°F | Ht 69.0 in | Wt 266.0 lb

## 2019-07-21 DIAGNOSIS — E559 Vitamin D deficiency, unspecified: Secondary | ICD-10-CM | POA: Diagnosis not present

## 2019-07-21 DIAGNOSIS — Z6839 Body mass index (BMI) 39.0-39.9, adult: Secondary | ICD-10-CM

## 2019-07-21 MED ORDER — VITAMIN D (ERGOCALCIFEROL) 1.25 MG (50000 UNIT) PO CAPS: 50000.0000 [IU] | ORAL_CAPSULE | ORAL | 0 refills | Status: DC

## 2019-07-25 NOTE — Progress Notes (Signed)
Office: [phone]  /  Fax: [phone]   HPI:   Chief Complaint: OBESITY John Morrow is here to discuss his progress with his obesity treatment plan. He is on the Category 3 plan and is following his eating plan approximately 70% of the time. He states he is walking 30 minutes 3 times per week. John Morrow continues to do well with weight loss on his Category 3 plan. He is doing well with his routine and getting good support at home. Hunger is controlled. His weight is 266 lb (120.7 kg) today and has had a weight loss of 4 pounds over a period of 5 weeks since his last visit. He has lost 11 lbs since starting treatment with [name].  Vitamin D deficiency John Morrow has a diagnosis of Vitamin D deficiency. He is currently stable on prescription Vit D and denies nausea, vomiting or muscle weakness.  ASSESSMENT AND PLAN:  Vitamin D deficiency - Plan: Vitamin D, Ergocalciferol, (DRISDOL) 1.25 MG (50000 UT) CAPS capsule  Class 2 severe obesity with serious comorbidity and body mass index (BMI) of 39.0 to 39.9 in adult, unspecified obesity type ([place])  PLAN:  Vitamin D Deficiency John Morrow was informed that low Vitamin D levels contributes to fatigue and are associated with obesity, breast, and colon cancer. He agrees to continue to take prescription Vit D @ 50,000 IU every week #4 with 0 refills and will follow-up for routine testing of Vitamin D, at least 2-3 times per year. He was informed of the risk of over-replacement of Vitamin D and agrees to not increase his dose unless he discusses this with [name] first. Levente agrees to follow-up with our clinic in 4 weeks.  Obesity John Morrow is currently in the action stage of change. As such, his goal is to continue with weight loss efforts. He has agreed to follow the Category 3 plan. Remi has been instructed to work up to a goal of 150 minutes of combined cardio and strengthening exercise per week for weight loss and overall health benefits. We discussed the  following Behavioral Modification Strategies today: increasing lean protein intake and holiday eating strategies.  John Morrow has agreed to follow-up with our clinic in 4 weeks. He was informed of the importance of frequent follow-up visits to maximize his success with intensive lifestyle modifications for his multiple health conditions.  ALLERGIES: Allergies  Allergen Reactions  . Morphine And Related     Rash, vomiting, itching     MEDICATIONS: Current Outpatient Medications on File Prior to Visit  Medication Sig Dispense Refill  . atorvastatin (LIPITOR) 40 MG tablet Take 1 tablet (40 mg total) by mouth daily. (Patient taking differently: Take 20 mg by mouth daily. ) 90 tablet 3  . buPROPion (WELLBUTRIN XL) 300 MG 24 hr tablet Take 1 tablet by mouth once daily 90 tablet 0  . diclofenac sodium (VOLTAREN) 1 % GEL Apply 4 g topically 4 (four) times daily. To affected joint. 100 g 11  . lisinopril-hydrochlorothiazide (ZESTORETIC) 20-25 MG tablet Take 1 tablet by mouth once daily 90 tablet 0  . tamsulosin (FLOMAX) 0.4 MG CAPS capsule Take 1 capsule by mouth once daily 90 capsule 0   No current facility-administered medications on file prior to visit.     PAST MEDICAL HISTORY: Past Medical History:  Diagnosis Date  . Back pain   . Diverticulosis of colon without hemorrhage 06/12/2016   On Colonoscopy 11/06/2010  . HLD (hyperlipidemia) 05/29/2016  . Hyperlipemia   . Hypertension   . Joint pain   .  Morbid obesity ([place]) 05/29/2016  . Prediabetes   . Right hip pain    needs hip replacement  . Sleep apnea     PAST SURGICAL HISTORY: Past Surgical History:  Procedure Laterality Date  . KNEE SURGERY      SOCIAL HISTORY: Social History   Tobacco Use  . Smoking status: Never Smoker  . Smokeless tobacco: Never Used  Substance Use Topics  . Alcohol use: Yes    Comment: occassionally  . Drug use: No    FAMILY HISTORY: Family History  Problem Relation Age of Onset  . Lung disease  Mother   . Hypertension Mother   . Lung disease Father   . Alcoholism Father    ROS: Review of Systems  Gastrointestinal: Negative for nausea and vomiting.  Musculoskeletal:       Negative for muscle weakness.   PHYSICAL EXAM: Blood pressure 122/79, pulse 75, temperature 98.1 F (36.7 C), temperature source Oral, height 5\' 9"  (1.753 m), weight 266 lb (120.7 kg), SpO2 95 %. Body mass index is 39.28 kg/m. Physical Exam Vitals signs reviewed.  Constitutional:      Appearance: Normal appearance. He is obese.  Cardiovascular:     Rate and Rhythm: Normal rate.     Pulses: Normal pulses.  Pulmonary:     Effort: Pulmonary effort is normal.     Breath sounds: Normal breath sounds.  Musculoskeletal: Normal range of motion.  Skin:    General: Skin is warm and dry.  Neurological:     Mental Status: He is alert and oriented to person, place, and time.  Psychiatric:        Behavior: Behavior normal.   RECENT LABS AND TESTS: BMET    Component Value Date/Time   NA 142 05/06/2019 0832   NA 144 09/27/2018 0755   K 4.2 05/06/2019 0832   CL 106 05/06/2019 0832   CO2 29 05/06/2019 0832   GLUCOSE 98 05/06/2019 0832   BUN 24 05/06/2019 0832   BUN 28 (H) 09/27/2018 0755   CREATININE 1.25 05/06/2019 0832   CALCIUM 9.3 05/06/2019 0832   GFRNONAA 52 (L) 10/25/2018 0759   GFRAA 61 10/25/2018 0759   Lab Results  Component Value Date   HGBA1C 5.5 05/06/2019   HGBA1C 5.4 09/27/2018   HGBA1C 5.4 06/05/2016   Lab Results  Component Value Date   INSULIN 17.7 09/27/2018   CBC    Component Value Date/Time   WBC 5.7 09/27/2018 0755   WBC 10.1 04/05/2018 0929   RBC 4.95 09/27/2018 0755   RBC 5.33 04/05/2018 0929   HGB 15.0 09/27/2018 0755   HCT 44.4 09/27/2018 0755   PLT 301 04/05/2018 0929   MCV 90 09/27/2018 0755   MCH 30.3 09/27/2018 0755   MCH 28.5 04/05/2018 0929   MCHC 33.8 09/27/2018 0755   MCHC 33.6 04/05/2018 0929   RDW 11.9 09/27/2018 0755   LYMPHSABS 1.3 09/27/2018  0755   EOSABS 0.1 09/27/2018 0755   BASOSABS 0.0 09/27/2018 0755   Iron/TIBC/Ferritin/ %Sat No results found for: IRON, TIBC, FERRITIN, IRONPCTSAT Lipid Panel     Component Value Date/Time   CHOL 113 09/27/2018 0755   TRIG 95 09/27/2018 0755   HDL 43 09/27/2018 0755   CHOLHDL 3.0 03/17/2018 0954   VLDL 19 06/05/2016 0827   LDLCALC 51 09/27/2018 0755   LDLCALC 62 03/17/2018 0954   Hepatic Function Panel     Component Value Date/Time   PROT 6.1 05/06/2019 0832   PROT 6.8 09/27/2018  0755   ALBUMIN 4.5 09/27/2018 0755   AST 15 05/06/2019 0832   ALT 22 05/06/2019 0832   ALKPHOS 61 09/27/2018 0755   BILITOT 0.7 05/06/2019 0832   BILITOT 0.7 09/27/2018 0755      Component Value Date/Time   TSH 1.040 09/27/2018 0755   TSH 1.18 06/05/2016 0827   Results for DANNY, CELIS "BOB" (MRN [id number]) as of 07/25/2019 09:11  Ref. Range 05/06/2019 08:32  Vitamin D, 25-Hydroxy Latest Ref Range: 30 - 100 ng/mL 56   OBESITY BEHAVIORAL INTERVENTION VISIT  Today's visit was #16  Starting weight: 277 lbs Starting date: 09/23/2018 Today's weight: 266 lbs  Today's date: 07/21/2019 Total lbs lost to date: 11 At least 15 minutes were spent on discussing the following behavioral intervention visit.    07/21/2019  Height 5\' 9"  (1.753 m)  Weight 266 lb (120.7 kg)  BMI (Calculated) 39.26  BLOOD PRESSURE - SYSTOLIC [id number]  BLOOD PRESSURE - DIASTOLIC 79   Body Fat % 39 %  Total Body Water (lbs) 122.6 lbs   ASK: We discussed the diagnosis of obesity with [name] today and Rishon agreed to give [name] permission to discuss obesity behavioral modification therapy today.  ASSESS: Markell has the diagnosis of obesity and his BMI today is 39.3. Lamarious is in the action stage of change.   ADVISE: Geancarlo was educated on the multiple health risks of obesity as well as the benefit of weight loss to improve his health. He was advised of the need for long term treatment and the importance of lifestyle  modifications to improve his current health and to decrease his risk of future health problems.  AGREE: Multiple dietary modification options and treatment options were discussed and  Amilcar agreed to follow the recommendations documented in the above note.  ARRANGE: Vaiden was educated on the importance of frequent visits to treat obesity as outlined per CMS and USPSTF guidelines and agreed to schedule his next follow up appointment today.  I, Michaelene Song, am acting as Location manager for Dennard Nip, MD  I have reviewed the above documentation for accuracy and completeness, and I agree with the above. -Dennard Nip, MD

## 2019-07-29 ENCOUNTER — Ambulatory Visit: Payer: Medicare Other | Admitting: Family Medicine

## 2019-07-30 ENCOUNTER — Other Ambulatory Visit: Payer: Self-pay | Admitting: Family Medicine

## 2019-08-15 ENCOUNTER — Other Ambulatory Visit: Payer: Self-pay | Admitting: Family Medicine

## 2019-08-16 ENCOUNTER — Ambulatory Visit (INDEPENDENT_AMBULATORY_CARE_PROVIDER_SITE_OTHER): Payer: Medicare Other | Admitting: Physician Assistant

## 2019-08-16 ENCOUNTER — Encounter: Payer: Self-pay | Admitting: Physician Assistant

## 2019-08-16 VITALS — BP 127/84 | Temp 98.8°F | Ht 69.0 in | Wt 265.0 lb

## 2019-08-16 DIAGNOSIS — Z6839 Body mass index (BMI) 39.0-39.9, adult: Secondary | ICD-10-CM | POA: Diagnosis not present

## 2019-08-16 DIAGNOSIS — E6609 Other obesity due to excess calories: Secondary | ICD-10-CM

## 2019-08-16 MED ORDER — BUPROPION HCL ER (XL) 150 MG PO TB24: 150.0000 mg | ORAL_TABLET | ORAL | 0 refills | Status: DC

## 2019-08-16 NOTE — Progress Notes (Signed)
Doing well. Needs refill Wellbutrin. [id number] completed.

## 2019-08-16 NOTE — Progress Notes (Signed)
Patient ID: John Morrow, male   DOB: Mar 21, 1951, 68 y.o.   MRN: [id number] .Marland KitchenVirtual Visit via Video Note  I connected with [name] on 08/16/19 at  8:10 AM EST by a video enabled telemedicine application and verified that I am speaking with the correct person using two identifiers.  Location: Patient: home Provider: clinic   I discussed the limitations of evaluation and management by telemedicine and the availability of in person appointments. The patient expressed understanding and agreed to proceed.  History of Present Illness: Pt is a 68 yo male who presents to the clinic for medication refill. He is out of wellbutrin and will need refill. He is actively working on weight loss with Dr. Leafy Ro. He has been using wellbutrin for extra help. No side effects. He does not notice any benefit. He would like to stop.     .. Active Ambulatory Problems    Diagnosis Date Noted  . BPH (benign prostatic hyperplasia) 05/29/2016  . Urine frequency 05/29/2016  . HTN (hypertension) 05/29/2016  . HLD (hyperlipidemia) 05/29/2016  . Class 2 obesity due to excess calories without serious comorbidity with body mass index (BMI) of 39.0 to 39.9 in adult 05/29/2016  . Bilateral knee pain 09/25/2016  . Dependent edema 05/05/2017  . Hip pain 06/28/2018  . Chronic pain 09/28/2018  . Vitamin D deficiency 02/08/2019  . Insulin resistance 02/08/2019  . Class 2 severe obesity with serious comorbidity and body mass index (BMI) of 39.0 to 39.9 in adult [facility]) 02/08/2019   Resolved Ambulatory Problems    Diagnosis Date Noted  . Diverticulosis of colon without hemorrhage 06/12/2016  . Rotator cuff tendonitis, right 06/17/2017  . Arthritis of right hip 09/21/2017  . Left knee DJD 10/06/2017  . Hypertensive kidney disease with CKD (chronic kidney disease) stage V ([place]) 03/17/2018  . Prediabetes 02/08/2019   Past Medical History:  Diagnosis Date  . Back pain   . Hyperlipemia   . Hypertension   .  Joint pain   . Morbid obesity ([place]) 05/29/2016  . Right hip pain   . Sleep apnea    Reviewed med, allergy problem list.   Observations/Objective: No acute distress. Normal mood and appearance.   .. Today's Vitals   08/16/19 0800  BP: 127/84  Temp: 98.8 F (37.1 C)  TempSrc: Oral  Weight: 265 lb (120.2 kg)  Height: 5\' 9"  (1.753 m)   Body mass index is 39.13 kg/m.  .. Depression screen [facility] 2/9 08/16/2019 09/23/2018 09/06/2018 01/11/2018 05/05/2017  Decreased Interest 0 3 0 0 0  Down, Depressed, Hopeless 0 1 0 0 0  PHQ - 2 Score 0 4 0 0 0  Altered sleeping - 0 - - -  Tired, decreased energy - 2 - - -  Change in appetite - 1 - - -  Feeling bad or failure about yourself  - 1 - - -  Trouble concentrating - 0 - - -  Suicidal thoughts - 0 - - -  PHQ-9 Score - 8 - - -  Difficult doing work/chores - Not difficult at all - - -   .Marland Kitchen GAD 7 : Generalized Anxiety Score 08/16/2019  Nervous, Anxious, on Edge 1  Control/stop worrying 0  Worry too much - different things 0  Trouble relaxing 0  Restless 0  Easily annoyed or irritable 1  Afraid - awful might happen 0  Total GAD 7 Score 2  Anxiety Difficulty Not difficult at all       Assessment and  Plan: ..Diagnoses and all orders for this visit:  Class 2 obesity due to excess calories without serious comorbidity with body mass index (BMI) of 39.0 to 39.9 in adult -     buPROPion (WELLBUTRIN XL) 150 MG 24 hr tablet; Take 1 tablet (150 mg total) by mouth every morning.   Pt wishes to come off wellbutrin. Cut down to 150mg  daily for next 4 weeks then stop. Discussed if he finds appetite increase or benefit we can restart. Continue to work on healthy lifestyle and exercise.   Encouraged to get fasting labs done by Dr. Georgina Snell. Will establish with Dr. Zigmund Daniel in a few months.      Follow Up Instructions:    I discussed the assessment and treatment plan with the patient. The patient was provided an opportunity to ask questions  and all were answered. The patient agreed with the plan and demonstrated an understanding of the instructions.   The patient was advised to call back or seek an in-person evaluation if the symptoms worsen or if the condition fails to improve as anticipated.    Iran Planas, PA-C

## 2019-08-18 ENCOUNTER — Ambulatory Visit (INDEPENDENT_AMBULATORY_CARE_PROVIDER_SITE_OTHER): Payer: Medicare Other | Admitting: Family Medicine

## 2019-08-22 ENCOUNTER — Ambulatory Visit (INDEPENDENT_AMBULATORY_CARE_PROVIDER_SITE_OTHER): Payer: Medicare Other | Admitting: Family Medicine

## 2019-08-22 ENCOUNTER — Other Ambulatory Visit: Payer: Self-pay

## 2019-08-22 ENCOUNTER — Encounter (INDEPENDENT_AMBULATORY_CARE_PROVIDER_SITE_OTHER): Payer: Self-pay | Admitting: Family Medicine

## 2019-08-22 VITALS — BP 137/78 | HR 76 | Temp 98.2°F | Ht 69.0 in | Wt 275.0 lb

## 2019-08-22 DIAGNOSIS — Z6841 Body Mass Index (BMI) 40.0 and over, adult: Secondary | ICD-10-CM | POA: Diagnosis not present

## 2019-08-22 DIAGNOSIS — F419 Anxiety disorder, unspecified: Secondary | ICD-10-CM

## 2019-08-22 DIAGNOSIS — E559 Vitamin D deficiency, unspecified: Secondary | ICD-10-CM | POA: Diagnosis not present

## 2019-08-22 MED ORDER — VITAMIN D (ERGOCALCIFEROL) 1.25 MG (50000 UNIT) PO CAPS: 50000.0000 [IU] | ORAL_CAPSULE | ORAL | 0 refills | Status: DC

## 2019-08-23 NOTE — Progress Notes (Signed)
Office: [phone]  /  Fax: [phone]   HPI:   Chief Complaint: OBESITY John Morrow is here to discuss his progress with his obesity treatment plan. He is on the Category 3 plan and is following his eating plan approximately 70 % of the time. He states he is walking for 30 minutes 3 times per week. John Morrow has increased stress eating and celebration eating over Thanksgiving. He feels some of this is associated with anxiety.  His weight is 275 lb (124.7 kg) today and has gained 9 lbs since his last visit. He has lost 2 lbs since starting treatment with [name].  Vitamin D Deficiency John Morrow has a diagnosis of vitamin D deficiency. He is stable on prescription Vit D and denies nausea, vomiting or muscle weakness.  Anxiety John Morrow feels his anxiety is worsening, and he has been biting his nails and has increased stress eating. He is not interested in medications. He denies suicidal ideas or homicidal ideas.  ASSESSMENT AND PLAN:  Vitamin D deficiency - Plan: Vitamin D, Ergocalciferol, (DRISDOL) 1.25 MG (50000 UT) CAPS capsule  Anxiety  Class 3 severe obesity with serious comorbidity and body mass index (BMI) of 40.0 to 44.9 in adult, unspecified obesity type (HCC)  PLAN:  Vitamin D Deficiency Low vitamin D level contributes to fatigue and are associated with obesity, breast, and colon cancer. John Morrow agrees to continue taking prescription Vit D 50,000 IU every week #4 and we will refill for 1 month. He will follow up for routine testing of vitamin D, at least 2-3 times per year to avoid over-replacement. John Morrow agrees to follow up with our clinic in 3 weeks.  Anxiety Cognitive behavioral therapy was discussed to help decrease emotional eating. An appointment with Dr. Mallie Mussel, our Bariatric Psychologist for therapy was offered, he will consider. There is no change in Wellbutrin for now, as this is a low dose. John Morrow agrees to follow up with our clinic in 3 weeks.  Obesity John Morrow is currently  in the action stage of change. As such, his goal is to continue with weight loss efforts He has agreed to follow the Category 3 plan John Morrow has been instructed to work up to a goal of 150 minutes of combined cardio and strengthening exercise per week for weight loss and overall health benefits. We discussed the following Behavioral Modification Strategies today: holiday eating strategies  and emotional eating strategies   John Morrow has agreed to follow up with our clinic in 3 weeks. He was informed of the importance of frequent follow up visits to maximize his success with intensive lifestyle modifications for his multiple health conditions.  ALLERGIES: Allergies  Allergen Reactions  . Morphine And Related     Rash, vomiting, itching     MEDICATIONS: Current Outpatient Medications on File Prior to Visit  Medication Sig Dispense Refill  . atorvastatin (LIPITOR) 40 MG tablet Take 1 tablet (40 mg total) by mouth daily. (Patient taking differently: Take 20 mg by mouth daily. ) 90 tablet 3  . buPROPion (WELLBUTRIN XL) 150 MG 24 hr tablet Take 1 tablet (150 mg total) by mouth every morning. 30 tablet 0  . lisinopril-hydrochlorothiazide (ZESTORETIC) 20-25 MG tablet Take 1 tablet by mouth once daily 90 tablet 0  . tamsulosin (FLOMAX) 0.4 MG CAPS capsule Take 1 capsule by mouth once daily 90 capsule 0   No current facility-administered medications on file prior to visit.     PAST MEDICAL HISTORY: Past Medical History:  Diagnosis Date  . Back pain   .  Diverticulosis of colon without hemorrhage 06/12/2016   On Colonoscopy 11/06/2010  . HLD (hyperlipidemia) 05/29/2016  . Hyperlipemia   . Hypertension   . Joint pain   . Morbid obesity ([place]) 05/29/2016  . Prediabetes   . Right hip pain    needs hip replacement  . Sleep apnea     PAST SURGICAL HISTORY: Past Surgical History:  Procedure Laterality Date  . KNEE SURGERY      SOCIAL HISTORY: Social History   Tobacco Use  . Smoking  status: Never Smoker  . Smokeless tobacco: Never Used  Substance Use Topics  . Alcohol use: Yes    Comment: occassionally  . Drug use: No    FAMILY HISTORY: Family History  Problem Relation Age of Onset  . Lung disease Mother   . Hypertension Mother   . Lung disease Father   . Alcoholism Father     ROS: Review of Systems  Constitutional: Negative for weight loss.  Gastrointestinal: Negative for nausea and vomiting.  Musculoskeletal:       Negative muscle weakness  Psychiatric/Behavioral: Negative for suicidal ideas.       + Anxiety Negative homicidal ideas    PHYSICAL EXAM: Blood pressure 137/78, pulse 76, temperature 98.2 F (36.8 C), temperature source Oral, height 5\' 9"  (1.753 m), weight 275 lb (124.7 kg), SpO2 96 %. Body mass index is 40.61 kg/m. Physical Exam Vitals signs reviewed.  Constitutional:      Appearance: Normal appearance. He is obese.  Cardiovascular:     Rate and Rhythm: Normal rate.     Pulses: Normal pulses.  Pulmonary:     Effort: Pulmonary effort is normal.     Breath sounds: Normal breath sounds.  Musculoskeletal: Normal range of motion.  Skin:    General: Skin is warm and dry.  Neurological:     Mental Status: He is alert and oriented to person, place, and time.  Psychiatric:        Mood and Affect: Mood normal.        Behavior: Behavior normal.     RECENT LABS AND TESTS: BMET    Component Value Date/Time   NA 142 05/06/2019 0832   NA 144 09/27/2018 0755   K 4.2 05/06/2019 0832   CL 106 05/06/2019 0832   CO2 29 05/06/2019 0832   GLUCOSE 98 05/06/2019 0832   BUN 24 05/06/2019 0832   BUN 28 (H) 09/27/2018 0755   CREATININE 1.25 05/06/2019 0832   CALCIUM 9.3 05/06/2019 0832   GFRNONAA 52 (L) 10/25/2018 0759   GFRAA 61 10/25/2018 0759   Lab Results  Component Value Date   HGBA1C 5.5 05/06/2019   HGBA1C 5.4 09/27/2018   HGBA1C 5.4 06/05/2016   Lab Results  Component Value Date   INSULIN 17.7 09/27/2018   CBC     Component Value Date/Time   WBC 5.7 09/27/2018 0755   WBC 10.1 04/05/2018 0929   RBC 4.95 09/27/2018 0755   RBC 5.33 04/05/2018 0929   HGB 15.0 09/27/2018 0755   HCT 44.4 09/27/2018 0755   PLT 301 04/05/2018 0929   MCV 90 09/27/2018 0755   MCH 30.3 09/27/2018 0755   MCH 28.5 04/05/2018 0929   MCHC 33.8 09/27/2018 0755   MCHC 33.6 04/05/2018 0929   RDW 11.9 09/27/2018 0755   LYMPHSABS 1.3 09/27/2018 0755   EOSABS 0.1 09/27/2018 0755   BASOSABS 0.0 09/27/2018 0755   Iron/TIBC/Ferritin/ %Sat No results found for: IRON, TIBC, FERRITIN, IRONPCTSAT Lipid Panel  Component Value Date/Time   CHOL 113 09/27/2018 0755   TRIG 95 09/27/2018 0755   HDL 43 09/27/2018 0755   CHOLHDL 3.0 03/17/2018 0954   VLDL 19 06/05/2016 0827   LDLCALC 51 09/27/2018 0755   LDLCALC 62 03/17/2018 0954   Hepatic Function Panel     Component Value Date/Time   PROT 6.1 05/06/2019 0832   PROT 6.8 09/27/2018 0755   ALBUMIN 4.5 09/27/2018 0755   AST 15 05/06/2019 0832   ALT 22 05/06/2019 0832   ALKPHOS 61 09/27/2018 0755   BILITOT 0.7 05/06/2019 0832   BILITOT 0.7 09/27/2018 0755      Component Value Date/Time   TSH 1.040 09/27/2018 0755   TSH 1.18 06/05/2016 0827      OBESITY BEHAVIORAL INTERVENTION VISIT  Today's visit was # 63   Starting weight: 277 lbs Starting date: 09/23/2018 Today's weight : 275 lbs  Today's date: 08/22/2019 Total lbs lost to date: 2 At least 15 minutes were spent on discussing the following behavioral intervention visit.   ASK: We discussed the diagnosis of obesity with John Morrow today and John Morrow agreed to give [name] permission to discuss obesity behavioral modification therapy today.  ASSESS: John Morrow has the diagnosis of obesity and his BMI today is 40.59 John Morrow is in the action stage of change   ADVISE: John Morrow was educated on the multiple health risks of obesity as well as the benefit of weight loss to improve his health. He was advised of the need for  long term treatment and the importance of lifestyle modifications to improve his current health and to decrease his risk of future health problems.  AGREE: Multiple dietary modification options and treatment options were discussed and  John Morrow agreed to follow the recommendations documented in the above note.  ARRANGE: John Morrow was educated on the importance of frequent visits to treat obesity as outlined per CMS and USPSTF guidelines and agreed to schedule his next follow up appointment today.  I, Trixie Dredge, am acting as transcriptionist for Dennard Nip, MD  I have reviewed the above documentation for accuracy and completeness, and I agree with the above. -Dennard Nip, MD

## 2019-08-29 NOTE — Progress Notes (Signed)
Subjective:   John Morrow is a 68 y.o. male who presents for Medicare Annual/Subsequent preventive examination.  Review of Systems:  No ROS.  Medicare Wellness Virtual Visit.  Visual/audio telehealth visit, UTA vital signs.   See social history for additional risk factors.    Cardiac Risk Factors include: advanced age (>78men, >43 women);obesity (BMI >30kg/m2);hypertension;male gender  Sleep patterns: Getting 8 hours of sleep a night. Wakes up 1 time a night to void.Wakes up and feels rested and ready for the day.  Home Safety/Smoke Alarms: Feels safe in home. Smoke alarms in place.  Living environment;Lives with wife in a 1 story home no stairs in or around the home. Shower is a walk in shower and a bench is in place. Seat Belt Safety/Bike Helmet: Wears seat belt.  Male:   CCS- UTD   PSA- UTD Lab Results  Component Value Date   PSA 1.3 03/17/2018   PSA 1.2 05/05/2017   PSA 2.9 06/05/2016        Objective:    Vitals: BP 128/88   Pulse 66   Temp 97.8 F (36.6 C) (Oral)   Ht 5\' 11"  (1.803 m)   Wt 272 lb (123.4 kg)   BMI 37.94 kg/m   Body mass index is 37.94 kg/m.  Advanced Directives 09/07/2019 09/06/2018 07/23/2017 05/29/2016  Does Patient Have a Medical Advance Directive? Yes Yes Yes Yes  Type of Paramedic of [place];Living will [place];Living will Healthcare Power of [place];Out of facility DNR (pink MOST or yellow form);Living will  Does patient want to make changes to medical advance directive? No - Patient declined No - Patient declined Yes (Inpatient - patient defers changing a medical advance directive at this time) No - Patient declined  Copy of [place] in Chart? Yes - validated most recent copy scanned in chart (See row information) Yes - validated most recent copy scanned in chart (See row information) Yes No - copy requested    Tobacco Social History    Tobacco Use  Smoking Status Never Smoker  Smokeless Tobacco Never Used     Counseling given: Not Answered   Clinical Intake:  Pre-visit preparation completed: Yes  Pain : No/denies pain     Nutritional Status: BMI > 30  Obese Nutritional Risks: None Diabetes: No  How often do you need to have someone help you when you read instructions, pamphlets, or other written materials from your doctor or pharmacy?: 1 - Never What is the last grade level you completed in school?: 18  Interpreter Needed?: No  Information entered by :: Orlie Dakin, LPN  Past Medical History:  Diagnosis Date  . Back pain   . Diverticulosis of colon without hemorrhage 06/12/2016   On Colonoscopy 11/06/2010  . HLD (hyperlipidemia) 05/29/2016  . Hyperlipemia   . Hypertension   . Joint pain   . Morbid obesity ([place]) 05/29/2016  . Prediabetes   . Right hip pain    needs hip replacement  . Sleep apnea    Past Surgical History:  Procedure Laterality Date  . KNEE SURGERY    . REPLACEMENT TOTAL HIP W/  RESURFACING IMPLANTS Right [id number]   Dr. Aleda Grana   Family History  Problem Relation Age of Onset  . Lung disease Mother   . Hypertension Mother   . Lung disease Father   . Alcoholism Father    Social History   Socioeconomic History  . Marital status: Married  Spouse name: [name]  . Number of children: 4  . Years of education: 87  . Highest education level: Master's degree (e.g., MA, MS, MEng, MEd, MSW, MBA)  Occupational History  . Occupation: retired    Comment: Pharmacist, hospital  Tobacco Use  . Smoking status: Never Smoker  . Smokeless tobacco: Never Used  Substance and Sexual Activity  . Alcohol use: Not Currently  . Drug use: No  . Sexual activity: Yes    Partners: Female  Other Topics Concern  . 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 Drinks coffee daily. Drinks a lot of water daily. Delivers groceries to elderly as needed.   Social  Determinants of Health   Financial Resource Strain:   . Difficulty of Paying Living Expenses: Not on file  Food Insecurity:   . Worried About Charity fundraiser in the Last Year: Not on file  . Ran Out of Food in the Last Year: Not on file  Transportation Needs:   . Lack of Transportation (Medical): Not on file  . Lack of Transportation (Non-Medical): Not on file  Physical Activity:   . Days of Exercise per Week: Not on file  . Minutes of Exercise per Session: Not on file  Stress:   . Feeling of Stress : Not on file  Social Connections:   . Frequency of Communication with Friends and Family: Not on file  . Frequency of Social Gatherings with Friends and Family: Not on file  . Attends Religious Services: Not on file  . Active Member of Clubs or Organizations: Not on file  . Attends Archivist Meetings: Not on file  . Marital Status: Not on file    Outpatient Encounter Medications as of 09/07/2019  Medication Sig  . atorvastatin (LIPITOR) 40 MG tablet Take 1 tablet (40 mg total) by mouth daily. (Patient taking differently: Take 20 mg by mouth daily. )  . buPROPion (WELLBUTRIN XL) 150 MG 24 hr tablet Take 1 tablet (150 mg total) by mouth every morning.  Marland Kitchen lisinopril-hydrochlorothiazide (ZESTORETIC) 20-25 MG tablet Take 1 tablet by mouth once daily  . tamsulosin (FLOMAX) 0.4 MG CAPS capsule Take 1 capsule by mouth once daily  . Vitamin D, Ergocalciferol, (DRISDOL) 1.25 MG (50000 UT) CAPS capsule Take 1 capsule (50,000 Units total) by mouth every 7 (seven) days.   No facility-administered encounter medications on file as of 09/07/2019.    Activities of Daily Living In your present state of health, do you have any difficulty performing the following activities: 09/07/2019  Hearing? N  Vision? N  Difficulty concentrating or making decisions? N  Walking or climbing stairs? N  Dressing or bathing? N  Doing errands, shopping? N  Preparing Food and eating ? N  Using the  Toilet? N  In the past six months, have you accidently leaked urine? N  Do you have problems with loss of bowel control? N  Managing your Medications? N  Managing your Finances? N  Housekeeping or managing your Housekeeping? N  Some recent data might be hidden    Patient Care Team: Gregor Hams, MD as PCP - General (Family Medicine)   Assessment:   This is a routine wellness examination for John Morrow.Physical assessment deferred to PCP.   Exercise Activities and Dietary recommendations Current Exercise Habits: Home exercise routine, Type of exercise: walking, Time (Minutes): 30, Frequency (Times/Week): 4, Weekly Exercise (Minutes/Week): 120, Intensity: Mild, Exercise limited by: None identified Diet  Eats a fairly healthy diet of vegetables, fruits and proteins. Breakfast: 3 boiled eggs and toast Lunch: baked chicken breast Dinner: Meat and vegetables. Follows a high protein diet and low calorie per Dr. Leafy Ro.    Drinks water daily  Goals    . Exercise 3x per week (30 min per time)     Start to exercise once hip replacement has been done and all healed.    . Weight (lb) < 200 lb (90.7 kg)     Wants to loose 50 lbs in the next year.       Fall Risk Fall Risk  09/07/2019 09/06/2018 01/11/2018 06/17/2017 06/10/2016  Falls in the past year? 0 0 No No No  Number falls in past yr: 0 - - - -  Injury with Fall? 0 - - - -  Risk for fall due to : - Impaired mobility - - -  Follow up Falls prevention discussed - - - -   Is the patient's home free of loose throw rugs in walkways, pet beds, electrical cords, etc?   yes      Grab bars in the bathroom? no      Handrails on the stairs?   no      Adequate lighting?   yes  Depression Screen PHQ 2/9 Scores 09/07/2019 08/16/2019 09/23/2018 09/06/2018  PHQ - 2 Score 0 0 4 0  PHQ- 9 Score - - 8 -    Cognitive Function     6CIT Screen 09/07/2019 09/06/2018 06/17/2017 06/10/2016  What Year? 0 points 0 points 0 points 0 points  What month? 0  points 0 points 0 points 0 points  What time? 0 points 0 points 0 points 0 points  Count back from 20 0 points 0 points 0 points 0 points  Months in reverse 0 points 0 points 0 points 0 points  Repeat phrase 0 points 0 points 2 points 2 points  Total Score 0 0 2 2    Immunization History  Administered Date(s) Administered  . Influenza, High Dose Seasonal PF 05/05/2017, 05/18/2019  . Influenza,inj,Quad PF,6+ Mos 05/29/2016, 05/27/2018  . Pneumococcal Conjugate-13 06/10/2016  . Pneumococcal Polysaccharide-23 06/17/2017  . Tdap 05/29/2016  . Zoster 01/17/2012  . Zoster Recombinat (Shingrix) 11/29/2016, 01/30/2017    Screening Tests Health Maintenance  Topic Date Due  . COLONOSCOPY  11/06/2020  . TETANUS/TDAP  05/29/2026  . INFLUENZA VACCINE  Completed  . Hepatitis C Screening  Completed  . PNA vac Low Risk Adult  Completed        Plan:  Please schedule your next medicare wellness visit with me in 1 yr.  John Morrow , Thank you for taking time to come for your Medicare Wellness Visit. I appreciate your ongoing commitment to your health goals. Please review the following plan we discussed and let me know if I can assist you in the future. Continue doing brain stimulating activities (puzzles, reading, adult coloring books, staying active) to keep memory sharp.     These are the goals we discussed: Goals    . Exercise 3x per week (30 min per time)     Start to exercise once hip replacement has been done and all healed.    . Weight (lb) < 200 lb (90.7 kg)     Wants to loose 50 lbs in the next year.       This is a list of the screening recommended for you and due dates:  Health Maintenance  Topic Date  Due  . Colon Cancer Screening  11/06/2020  . Tetanus Vaccine  05/29/2026  . Flu Shot  Completed  .  Hepatitis C: One time screening is recommended by Center for Disease Control  (CDC) for  adults born from [birth year] through 1966.   Completed  . Pneumonia vaccines  Completed      These are the goals we discussed: Goals    . Exercise 3x per week (30 min per time)     Start to exercise once hip replacement has been done and all healed.    . Weight (lb) < 200 lb (90.7 kg)     Wants to loose 50 lbs in the next year.       This is a list of the screening recommended for you and due dates:  Health Maintenance  Topic Date Due  . Colon Cancer Screening  11/06/2020  . Tetanus Vaccine  05/29/2026  . Flu Shot  Completed  .  Hepatitis C: One time screening is recommended by Center for Disease Control  (CDC) for  adults born from [birth year] through 1966.   Completed  . Pneumonia vaccines  Completed     I have personally reviewed and noted the following in the patient's chart:   . Medical and social history . Use of alcohol, tobacco or illicit drugs  . Current medications and supplements . Functional ability and status . Nutritional status . Physical activity . Advanced directives . List of other physicians . Hospitalizations, surgeries, and ER visits in previous 12 months . Vitals . Screenings to include cognitive, depression, and falls . Referrals and appointments  In addition, I have reviewed and discussed with patient certain preventive protocols, quality metrics, and best practice recommendations. A written personalized care plan for preventive services as well as general preventive health recommendations were provided to patient.     Joanne Chars, LPN  [phone]

## 2019-09-07 ENCOUNTER — Ambulatory Visit (INDEPENDENT_AMBULATORY_CARE_PROVIDER_SITE_OTHER): Payer: Medicare Other | Admitting: *Deleted

## 2019-09-07 VITALS — BP 128/88 | HR 66 | Temp 97.8°F | Ht 71.0 in | Wt 272.0 lb

## 2019-09-07 DIAGNOSIS — Z0000 Encounter for general adult medical examination without abnormal findings: Secondary | ICD-10-CM

## 2019-09-07 NOTE — Patient Instructions (Signed)
Please schedule your next medicare wellness visit with me in 1 yr.  John Morrow , Thank you for taking time to come for your Medicare Wellness Visit. I appreciate your ongoing commitment to your health goals. Please review the following plan we discussed and let me know if I can assist you in the future. Continue doing brain stimulating activities (puzzles, reading, adult coloring books, staying active) to keep memory sharp.  These are the goals we discussed: Goals    . Exercise 3x per week (30 min per time)     Start to exercise once hip replacement has been done and all healed.    . Weight (lb) < 200 lb (90.7 kg)     Wants to loose 50 lbs in the next year.

## 2019-09-14 ENCOUNTER — Other Ambulatory Visit: Payer: Self-pay | Admitting: Physician Assistant

## 2019-09-14 DIAGNOSIS — E6609 Other obesity due to excess calories: Secondary | ICD-10-CM

## 2019-09-19 ENCOUNTER — Ambulatory Visit (INDEPENDENT_AMBULATORY_CARE_PROVIDER_SITE_OTHER): Payer: Medicare Other | Admitting: Family Medicine

## 2019-09-21 IMAGING — DX DG ABDOMEN 1V
2 series · 2 of 2 positions shown · non-contrast
Comparison: Pelvis radiographs 07/21/2017.

CLINICAL DATA: 67-year-old male with sudden onset abdominal pain
and diarrhea yesterday.

EXAM:
ABDOMEN - 1 VIEW

[abdomen kub (1 of 2)]
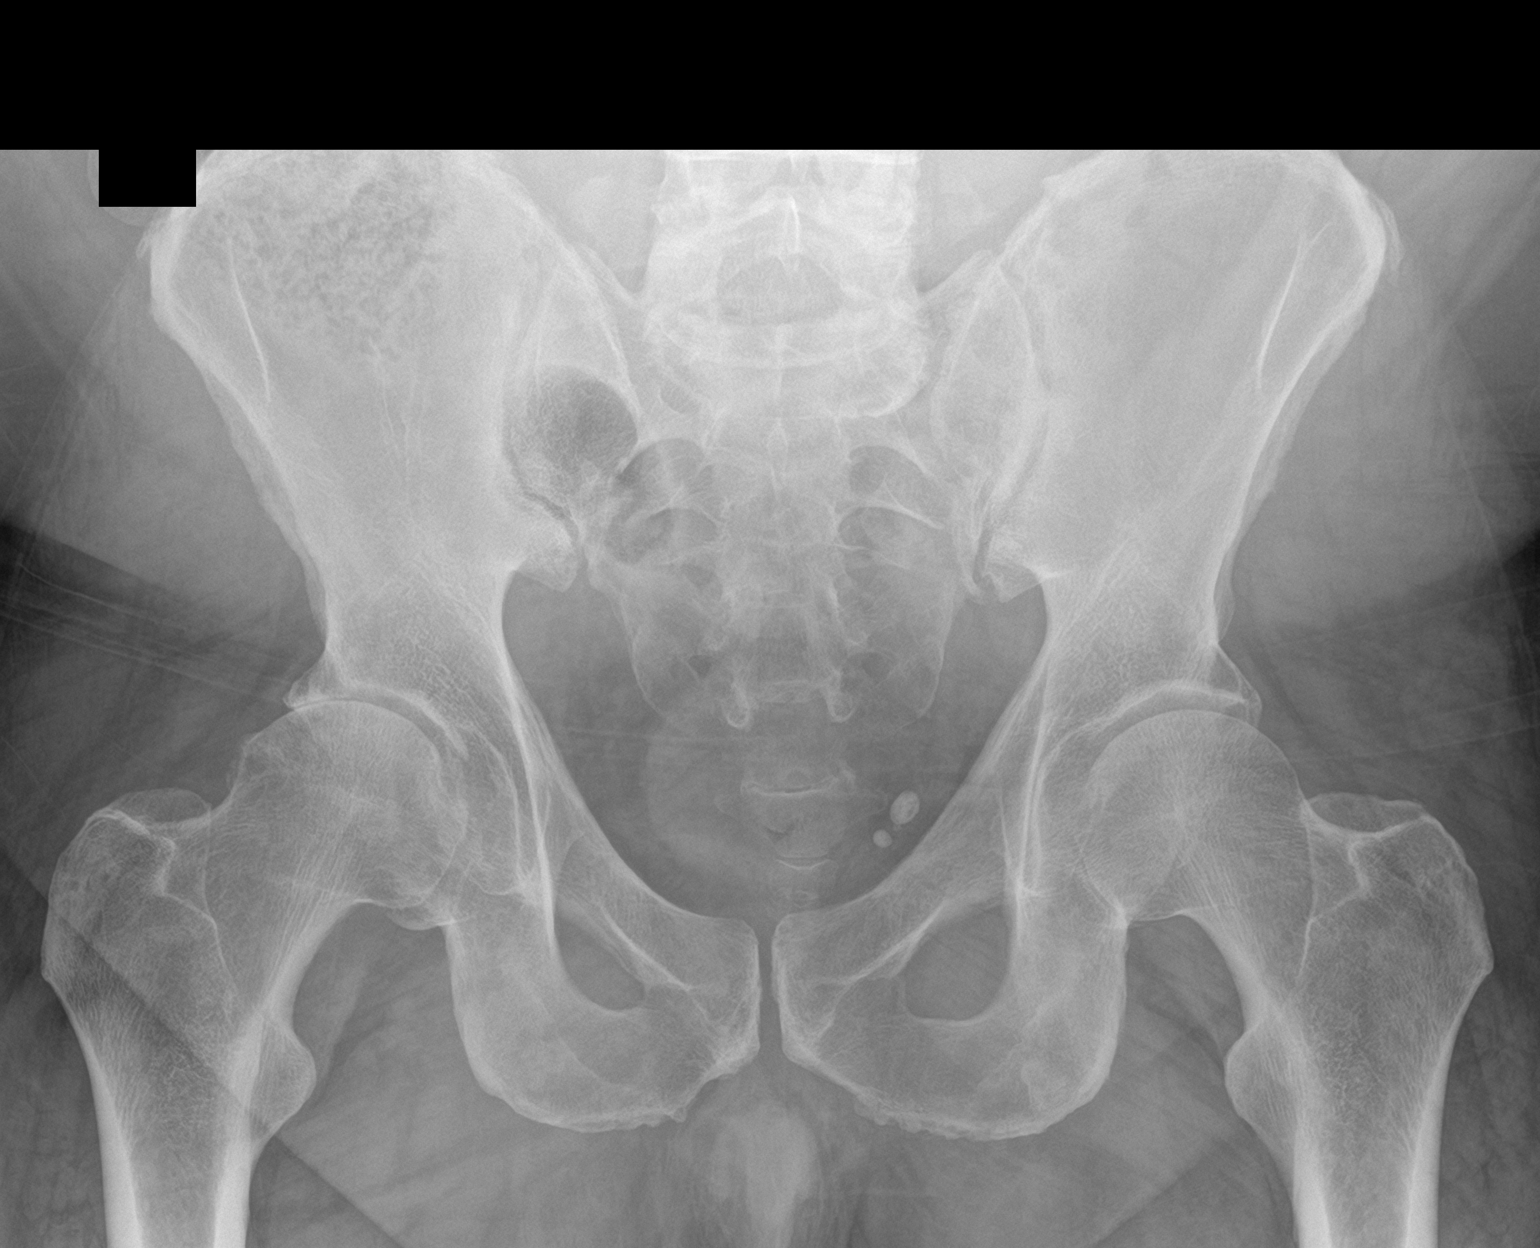

[abdomen kub (2 of 2)]
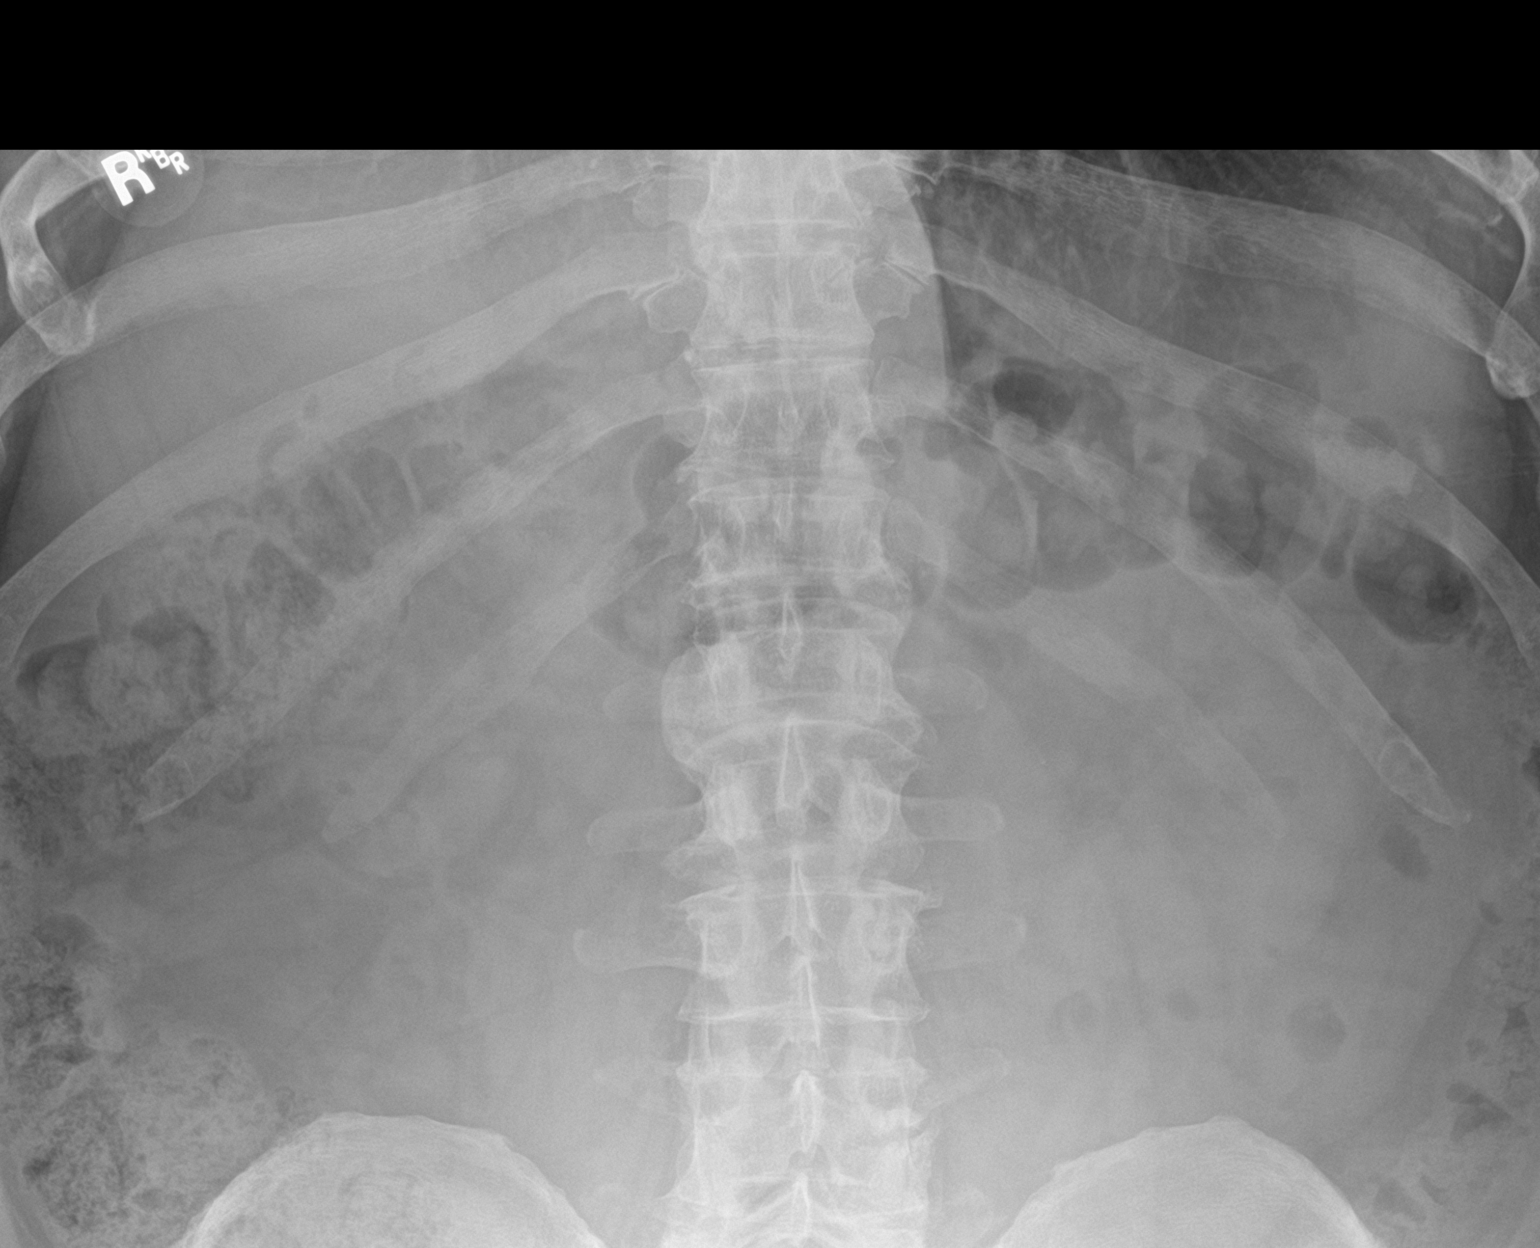

[2 of 2 positions shown; findings below may reference images not displayed]

FINDINGS: Normal bowel gas pattern and abdominal visceral contours. No
pneumoperitoneum identified on these images which are likely supine.
No acute osseous abnormality identified. Chronic pelvic phleboliths
incidentally noted on the left.
IMPRESSION: Negative.

## 2019-09-26 ENCOUNTER — Encounter (INDEPENDENT_AMBULATORY_CARE_PROVIDER_SITE_OTHER): Payer: Self-pay | Admitting: Family Medicine

## 2019-09-26 ENCOUNTER — Other Ambulatory Visit: Payer: Self-pay

## 2019-09-26 ENCOUNTER — Ambulatory Visit (INDEPENDENT_AMBULATORY_CARE_PROVIDER_SITE_OTHER): Payer: Medicare PPO | Admitting: Family Medicine

## 2019-09-26 VITALS — BP 125/81 | HR 86 | Temp 98.8°F | Ht 69.0 in | Wt 276.0 lb

## 2019-09-26 DIAGNOSIS — E7849 Other hyperlipidemia: Secondary | ICD-10-CM | POA: Diagnosis not present

## 2019-09-26 DIAGNOSIS — Z6841 Body Mass Index (BMI) 40.0 and over, adult: Secondary | ICD-10-CM

## 2019-09-26 DIAGNOSIS — E559 Vitamin D deficiency, unspecified: Secondary | ICD-10-CM

## 2019-09-26 MED ORDER — VITAMIN D (ERGOCALCIFEROL) 1.25 MG (50000 UNIT) PO CAPS: 50000.0000 [IU] | ORAL_CAPSULE | ORAL | 0 refills | Status: DC

## 2019-09-28 NOTE — Progress Notes (Signed)
Chief Complaint:   OBESITY John Morrow is here to discuss his progress with his obesity treatment plan along with follow-up of his obesity related diagnoses. John Morrow is on the Category 3 Plan and states he is following his eating plan approximately 70% of the time. John Morrow states he is walking for 30 minutes 3 times per week.  Today's visit was #: 18 Starting weight: 277 lbs Starting date: 09/23/2018 Today's weight: 276 lbs Today's date: 09/26/2019 Total lbs lost to date: 1 Total lbs lost since last in-office visit: 0  Interim History: John Morrow did well minimizing holiday weight gain with only a 1 lb weight gain in December. He states he is ready to get back on track with his eating plan and continue with exercise.  Subjective:   1. Vitamin D deficiency John Morrow is stable on Vit D, and he is due for labs. He denies nausea, vomiting, or muscle weakness.  2. Other hyperlipidemia John Morrow is stable on Lipitor. He is working on diet and is due to have labs rechecked.  Assessment/Plan:   1. Vitamin D deficiency Low Vitamin D level contributes to fatigue and are associated with obesity, breast, and colon cancer. We will refill prescription Vit D for 1 month. We will check labs today, and he will follow-up for routine testing of vitamin D, at least 2-3 times per year to avoid over-replacement. We will continue to monitor.  - Vitamin D (25 hydroxy) - Vitamin D, Ergocalciferol, (DRISDOL) 1.25 MG (50000 UNIT) CAPS capsule; Take 1 capsule (50,000 Units total) by mouth every 7 (seven) days.  Dispense: 4 capsule; Refill: 0  2. Other hyperlipidemia Cardiovascular risk and specific lipid/LDL goals reviewed. We discussed several lifestyle modifications today. John Morrow agrees to continue Lipitor, and he will continue to work on diet, exercise and weight loss efforts. We will check labs today. Orders and follow up as documented in patient record.   Counseling Intensive lifestyle modifications are the  first line treatment for this issue. . Dietary changes: Increase soluble fiber. Decrease simple carbohydrates. . Exercise changes: Moderate to vigorous-intensity aerobic activity 150 minutes per week if tolerated. . Lipid-lowering medications: see documented in medical record.  - Comprehensive Metabolic Panel (CMET) - HgB A1c - Insulin, random - Lipid Panel With LDL/HDL Ratio  3. Class 3 severe obesity with serious comorbidity and body mass index (BMI) of 40.0 to 44.9 in adult, unspecified obesity type [facility]) John Morrow is currently in the action stage of change. As such, his goal is to continue with weight loss efforts. He has agreed to on the Category 3 Plan.   We discussed the following exercise goals today: John Morrow is to continue his current exercise regimen as is.  We discussed the following behavioral modification strategies today: meal planning and cooking strategies.  John Morrow has agreed to follow-up with our clinic in 3 weeks. He was informed of the importance of frequent follow-up visits to maximize his success with intensive lifestyle modifications for his multiple health conditions.   John Morrow was informed we would discuss his lab results at his next visit unless there is a critical issue that needs to be addressed sooner. John Morrow agreed to keep his next visit at the agreed upon time to discuss these results.  Objective:   Blood pressure 125/81, pulse 86, temperature 98.8 F (37.1 C), temperature source Oral, height 5\' 9"  (1.753 m), weight 276 lb (125.2 kg), SpO2 97 %. Body mass index is 40.76 kg/m.  General: Cooperative, alert, well developed, in no acute  distress. HEENT: Conjunctivae and lids unremarkable. Neck: No thyromegaly.  Cardiovascular: Regular rhythm.  Lungs: Normal work of breathing. Extremities: No edema.  Neurologic: No focal deficits.   Lab Results  Component Value Date   CREATININE 1.25 05/06/2019   BUN 24 05/06/2019   NA 142 05/06/2019   K 4.2  05/06/2019   CL 106 05/06/2019   CO2 29 05/06/2019   Lab Results  Component Value Date   ALT 22 05/06/2019   AST 15 05/06/2019   ALKPHOS 61 09/27/2018   BILITOT 0.7 05/06/2019   Lab Results  Component Value Date   HGBA1C 5.5 05/06/2019   HGBA1C 5.4 09/27/2018   HGBA1C 5.4 06/05/2016   Lab Results  Component Value Date   INSULIN 17.7 09/27/2018   Lab Results  Component Value Date   TSH 1.040 09/27/2018   Lab Results  Component Value Date   CHOL 113 09/27/2018   HDL 43 09/27/2018   LDLCALC 51 09/27/2018   TRIG 95 09/27/2018   CHOLHDL 3.0 03/17/2018   Lab Results  Component Value Date   WBC 5.7 09/27/2018   HGB 15.0 09/27/2018   HCT 44.4 09/27/2018   MCV 90 09/27/2018   PLT 301 04/05/2018   No results found for: IRON, TIBC, FERRITIN  Obesity Behavioral Intervention Documentation for Insurance:   Approximately 15 minutes were spent on the discussion below.  ASK: We discussed the diagnosis of obesity with John Morrow today and John Morrow agreed to give [name] permission to discuss obesity behavioral modification therapy today.  ASSESS: John Morrow has the diagnosis of obesity and his BMI today is 40.74. John Morrow is in the action stage of change.   ADVISE: John Morrow was educated on the multiple health risks of obesity as well as the benefit of weight loss to improve his health. He was advised of the need for long term treatment and the importance of lifestyle modifications to improve his current health and to decrease his risk of future health problems.  AGREE: Multiple dietary modification options and treatment options were discussed and John Morrow agreed to follow the recommendations documented in the above note.  ARRANGE: John Morrow was educated on the importance of frequent visits to treat obesity as outlined per CMS and USPSTF guidelines and agreed to schedule his next follow up appointment today.  Attestation Statements:   Reviewed by clinician on day of visit: allergies,  medications, problem list, medical history, surgical history, family history, social history, and previous encounter notes.   I, Trixie Dredge, am acting as transcriptionist for Dennard Nip, MD.  I have reviewed the above documentation for accuracy and completeness, and I agree with the above. -  Dennard Nip, MD

## 2019-10-06 ENCOUNTER — Other Ambulatory Visit (INDEPENDENT_AMBULATORY_CARE_PROVIDER_SITE_OTHER): Payer: Self-pay | Admitting: Family Medicine

## 2019-10-06 DIAGNOSIS — I10 Essential (primary) hypertension: Secondary | ICD-10-CM | POA: Diagnosis not present

## 2019-10-06 DIAGNOSIS — E785 Hyperlipidemia, unspecified: Secondary | ICD-10-CM | POA: Diagnosis not present

## 2019-10-06 DIAGNOSIS — N400 Enlarged prostate without lower urinary tract symptoms: Secondary | ICD-10-CM | POA: Diagnosis not present

## 2019-10-06 DIAGNOSIS — E782 Mixed hyperlipidemia: Secondary | ICD-10-CM | POA: Diagnosis not present

## 2019-10-06 DIAGNOSIS — E559 Vitamin D deficiency, unspecified: Secondary | ICD-10-CM | POA: Diagnosis not present

## 2019-10-06 LAB — PSA: PSA: 1.2 ng/mL (ref <=4.0)

## 2019-10-07 LAB — COMPREHENSIVE METABOLIC PANEL
Albumin: 4.2 (ref 3.5–5.0)
Calcium: 9.4 (ref 8.7–10.7)
GFR calc Af Amer: 62
GFR calc non Af Amer: 53
Globulin: 2.2

## 2019-10-07 LAB — HEMOGLOBIN A1C: Hemoglobin A1C: 5.3

## 2019-10-07 LAB — COMPLETE METABOLIC PANEL WITH GFR
AG Ratio: 1.9 (calc) (ref 1.0–2.5)
ALT: 19 U/L (ref 9–46)
AST: 17 U/L (ref 10–35)
Albumin: 4.2 g/dL (ref 3.6–5.1)
Alkaline phosphatase (APISO): 57 U/L (ref 35–144)
BUN/Creatinine Ratio: 19 (calc) (ref 6–22)
BUN: 26 mg/dL — ABNORMAL HIGH (ref 7–25)
CO2: 29 mmol/L (ref 20–32)
Calcium: 9.4 mg/dL (ref 8.6–10.3)
Chloride: 106 mmol/L (ref 98–110)
Creat: 1.35 mg/dL — ABNORMAL HIGH (ref 0.70–1.25)
GFR, Est African American: 62 mL/min/{1.73_m2} (ref >=60)
GFR, Est Non African American: 53 mL/min/{1.73_m2} — ABNORMAL LOW (ref >=60)
Globulin: 2.2 g/dL (calc) (ref 1.9–3.7)
Glucose, Bld: 101 mg/dL — ABNORMAL HIGH (ref 65–99)
Potassium: 3.7 mmol/L (ref 3.5–5.3)
Sodium: 142 mmol/L (ref 135–146)
Total Bilirubin: 0.7 mg/dL (ref 0.2–1.2)
Total Protein: 6.4 g/dL (ref 6.1–8.1)

## 2019-10-07 LAB — LIPID PANEL
Cholesterol: 112 (ref 0–200)
Cholesterol: 112 mg/dL (ref <200)
HDL: 37 (ref 35–70)
HDL: 37 mg/dL — ABNORMAL LOW (ref >=40)
LDL Cholesterol (Calc): 57 mg/dL (calc)
LDL Cholesterol: 57
Non-HDL Cholesterol (Calc): 75 mg/dL (calc) (ref <130)
Total CHOL/HDL Ratio: 3 (calc) (ref <5.0)
Triglycerides: 92 (ref 40–160)
Triglycerides: 92 mg/dL (ref <150)

## 2019-10-07 LAB — VITAMIN D 25 HYDROXY (VIT D DEFICIENCY, FRACTURES)
Vit D, 25-Hydroxy: 75
Vit D, 25-Hydroxy: 75 ng/mL (ref 30–100)

## 2019-10-07 LAB — INSULIN, RANDOM: Insulin: 16.7 u[IU]/mL

## 2019-10-07 LAB — HEPATIC FUNCTION PANEL
ALT: 19 (ref 10–40)
AST: 17 (ref 14–40)
Alkaline Phosphatase: 57 (ref 25–125)
Bilirubin, Total: 0.7

## 2019-10-07 LAB — BASIC METABOLIC PANEL
BUN: 26 — AB (ref 4–21)
CO2: 29 — AB (ref 13–22)
Chloride: 106 (ref 99–108)
Creatinine: 1.4 — AB (ref 0.6–1.3)
Glucose: 101
Potassium: 3.7 (ref 3.4–5.3)
Sodium: 142 (ref 137–147)

## 2019-10-07 LAB — HEMOGLOBIN A1C W/OUT EAG: Hgb A1c MFr Bld: 5.3 % of total Hgb (ref <5.7)

## 2019-10-10 ENCOUNTER — Encounter (INDEPENDENT_AMBULATORY_CARE_PROVIDER_SITE_OTHER): Payer: Self-pay

## 2019-10-10 DIAGNOSIS — C44329 Squamous cell carcinoma of skin of other parts of face: Secondary | ICD-10-CM | POA: Diagnosis not present

## 2019-10-10 DIAGNOSIS — L570 Actinic keratosis: Secondary | ICD-10-CM | POA: Diagnosis not present

## 2019-10-10 DIAGNOSIS — L821 Other seborrheic keratosis: Secondary | ICD-10-CM | POA: Diagnosis not present

## 2019-10-11 ENCOUNTER — Encounter (INDEPENDENT_AMBULATORY_CARE_PROVIDER_SITE_OTHER): Payer: Self-pay

## 2019-10-17 ENCOUNTER — Other Ambulatory Visit: Payer: Self-pay

## 2019-10-17 ENCOUNTER — Ambulatory Visit (INDEPENDENT_AMBULATORY_CARE_PROVIDER_SITE_OTHER): Payer: Medicare PPO | Admitting: Family Medicine

## 2019-10-17 ENCOUNTER — Encounter (INDEPENDENT_AMBULATORY_CARE_PROVIDER_SITE_OTHER): Payer: Self-pay | Admitting: Family Medicine

## 2019-10-17 VITALS — BP 132/92 | HR 93 | Temp 98.3°F | Ht 69.0 in | Wt 278.0 lb

## 2019-10-17 DIAGNOSIS — Z6841 Body Mass Index (BMI) 40.0 and over, adult: Secondary | ICD-10-CM

## 2019-10-17 DIAGNOSIS — E860 Dehydration: Secondary | ICD-10-CM

## 2019-10-17 DIAGNOSIS — E559 Vitamin D deficiency, unspecified: Secondary | ICD-10-CM

## 2019-10-17 MED ORDER — VITAMIN D (ERGOCALCIFEROL) 1.25 MG (50000 UNIT) PO CAPS: 50000.0000 [IU] | ORAL_CAPSULE | ORAL | 0 refills | Status: DC

## 2019-10-17 NOTE — Progress Notes (Signed)
Chief Complaint:   OBESITY John Morrow is here to discuss his progress with his obesity treatment plan along with follow-up of his obesity related diagnoses. John Morrow is on the Category 3 Plan and states John Morrow is following his eating plan approximately 60-70% of the time. John Morrow states John Morrow is walking for 30 minutes 2-3 times per week.  Today's visit was #: 35 Starting weight: 277 lbs Starting date: 09/23/2018 Today's weight: 278 lbs Today's date: 10/17/2019 Total lbs lost to date: 0 Total lbs lost since last in-office visit: 0  Interim History: John Morrow has had increased food temptations and has indulged a bit more since our last visit. John Morrow states John Morrow has 2 celebration events coming up, but otherwise John Morrow is ready to get back on track.  Subjective:   1. Vitamin D deficiency John Morrow's Vit D level is at goal on Vit D prescription. John Morrow is not currently over-replaced, but John Morrow is at risk of this over time.  2. Dehydration John Morrow has chronic renal impairment and mildly elevated BUN. John Morrow drinks regular coffee and John Morrow admits to not drinking as much water.  Assessment/Plan:   1. Vitamin D deficiency Low Vitamin D level contributes to fatigue and are associated with obesity, breast, and colon cancer. We will refill prescription Vitamin D for 1 month. John Morrow will follow-up for routine testing of Vitamin D, at least 2-3 times per year to avoid over-replacement. We will plan to recheck Vit D level in the Spring, and will likely need to decrease his dose at that time.  - Vitamin D, Ergocalciferol, (DRISDOL) 1.25 MG (50000 UNIT) CAPS capsule; Take 1 capsule (50,000 Units total) by mouth every 7 (seven) days.  Dispense: 4 capsule; Refill: 0  2. Dehydration John Morrow is to increase his water until his urine is very light colored, and we will continue to monitor.  3. Class 3 severe obesity with serious comorbidity and body mass index (BMI) of 40.0 to 44.9 in adult, unspecified obesity type John Morrow is currently in  the action stage of change. As such, his goal is to continue with weight loss efforts. John Morrow has agreed to the Category 3 Plan.   Exercise goals: No exercise has been prescribed at this time.  Behavioral modification strategies: increasing lean protein intake, increasing water intake and celebration eating strategies.  John Morrow has agreed to follow-up with our clinic in 3 weeks. John Morrow was informed of the importance of frequent follow-up visits to maximize his success with intensive lifestyle modifications for his multiple health conditions.   Objective:   Blood pressure (!) 132/92, pulse 93, temperature 98.3 F (36.8 C), temperature source Oral, height 5\' 9"  (1.753 m), weight 278 lb (126.1 kg), SpO2 96 %. Body mass index is 41.05 kg/m.  General: Cooperative, alert, well developed, in no acute distress. HEENT: Conjunctivae and lids unremarkable. Cardiovascular: Regular rhythm.  Lungs: Normal work of breathing. Neurologic: No focal deficits.   Lab Results  Component Value Date   CREATININE 1.4 (A) 10/07/2019   BUN 26 (A) 10/07/2019   NA 142 10/07/2019   K 3.7 10/07/2019   CL 106 10/07/2019   CO2 29 (A) 10/07/2019   Lab Results  Component Value Date   ALT 19 10/07/2019   AST 17 10/07/2019   ALKPHOS 57 10/07/2019   BILITOT 0.7 10/06/2019   Lab Results  Component Value Date   HGBA1C 5.3 10/07/2019   HGBA1C 5.3 10/06/2019   HGBA1C 5.5 05/06/2019   HGBA1C 5.4 09/27/2018   HGBA1C 5.4 06/05/2016  Lab Results  Component Value Date   INSULIN 17.7 09/27/2018   Lab Results  Component Value Date   TSH 1.040 09/27/2018   Lab Results  Component Value Date   CHOL 112 10/06/2019   HDL 37 (L) 10/06/2019   LDLCALC 57 10/06/2019   TRIG 92 10/06/2019   CHOLHDL 3.0 10/06/2019   Lab Results  Component Value Date   WBC 5.7 09/27/2018   HGB 15.0 09/27/2018   HCT 44.4 09/27/2018   MCV 90 09/27/2018   PLT 301 04/05/2018   No results found for: IRON, TIBC, FERRITIN  Obesity  Behavioral Intervention Documentation for Insurance:   Approximately 15 minutes were spent on the discussion below.  ASK: We discussed the diagnosis of obesity with John Morrow today and John Morrow agreed to give [name] permission to discuss obesity behavioral modification therapy today.  ASSESS: John Morrow has the diagnosis of obesity and his BMI today is 41.03. John Morrow is in the action stage of change.   ADVISE: John Morrow was educated on the multiple health risks of obesity as well as the benefit of weight loss to improve his health. John Morrow was advised of the need for long term treatment and the importance of lifestyle modifications to improve his current health and to decrease his risk of future health problems.  AGREE: Multiple dietary modification options and treatment options were discussed and John Morrow agreed to follow the recommendations documented in the above note.  ARRANGE: John Morrow was educated on the importance of frequent visits to treat obesity as outlined per CMS and USPSTF guidelines and agreed to schedule his next follow up appointment today.  Attestation Statements:   Reviewed by clinician on day of visit: allergies, medications, problem list, medical history, surgical history, family history, social history, and previous encounter notes.   I, John Morrow, am acting as transcriptionist for Dennard Nip, MD.  I have reviewed the above documentation for accuracy and completeness, and I agree with the above. -  Dennard Nip, MD

## 2019-10-18 ENCOUNTER — Encounter: Payer: Self-pay | Admitting: Family Medicine

## 2019-10-18 ENCOUNTER — Ambulatory Visit (INDEPENDENT_AMBULATORY_CARE_PROVIDER_SITE_OTHER): Payer: Medicare PPO | Admitting: Family Medicine

## 2019-10-18 DIAGNOSIS — N400 Enlarged prostate without lower urinary tract symptoms: Secondary | ICD-10-CM | POA: Diagnosis not present

## 2019-10-18 DIAGNOSIS — I10 Essential (primary) hypertension: Secondary | ICD-10-CM

## 2019-10-18 DIAGNOSIS — E559 Vitamin D deficiency, unspecified: Secondary | ICD-10-CM | POA: Diagnosis not present

## 2019-10-18 DIAGNOSIS — E782 Mixed hyperlipidemia: Secondary | ICD-10-CM | POA: Diagnosis not present

## 2019-10-18 NOTE — Assessment & Plan Note (Signed)
Lab Results  Component Value Date   LDLCALC 57 10/06/2019  Continue current dose of atorvastatin

## 2019-10-18 NOTE — Assessment & Plan Note (Signed)
Currently on high dose vitamin d weekly.  Levels improved.

## 2019-10-18 NOTE — Assessment & Plan Note (Signed)
LUT's are well managed with flomax.  Recent PSA 1.2

## 2019-10-18 NOTE — Assessment & Plan Note (Signed)
Blood pressure is at goal at for age and co-morbidities.  I recommend that he continue current dose of lisinopril-hctz.  In addition they were instructed to follow a low sodium diet with regular exercise to help to maintain adequate control of blood pressure.

## 2019-10-18 NOTE — Patient Instructions (Signed)
Great to meet you today! Continue current medications and follow up with me in 6 months.

## 2019-10-18 NOTE — Progress Notes (Signed)
John Morrow - 69 y.o. male MRN [id number]  Date of birth: 31-May-1951  Subjective Chief Complaint  Patient presents with  . Follow-up    HPI John Morrow is a 69 y.o. male with history of HTN, BPH, and HLD here today for routine follow up and meet new provider.  He reports he is doing well.  He is also followed by healthy weight and wellness clinic.  He is now taking vitamin d due to vitamin d deficiency.  His blood pressure has remained well controlled with lisinopril-hctz.  He is compliant with atorvastatin, denies side effects.  He did have recent labs which I reviewed today.  He is without chest pain, shortness of breath, palpitations, headache or vision changes.   ROS:  A comprehensive ROS was completed and negative except as noted per HPI  Allergies  Allergen Reactions  . Morphine And Related     Rash, vomiting, itching     Past Medical History:  Diagnosis Date  . Back pain   . Diverticulosis of colon without hemorrhage 06/12/2016   On Colonoscopy 11/06/2010  . HLD (hyperlipidemia) 05/29/2016  . Hyperlipemia   . Hypertension   . Joint pain   . Morbid obesity ([place]) 05/29/2016  . Prediabetes   . Right hip pain    needs hip replacement  . Sleep apnea     Past Surgical History:  Procedure Laterality Date  . KNEE SURGERY    . REPLACEMENT TOTAL HIP W/  RESURFACING IMPLANTS Right [id number]   Dr. Aleda Grana    Social History   Socioeconomic History  . Marital status: Married    Spouse name: [name]  . Number of children: 4  . Years of education: 20  . Highest education level: Master's degree (e.g., MA, MS, MEng, MEd, MSW, MBA)  Occupational History  . Occupation: retired    Comment: Pharmacist, hospital  Tobacco Use  . Smoking status: Never Smoker  . Smokeless tobacco: Never Used  Substance and Sexual Activity  . Alcohol use: Not Currently  . Drug use: No  . Sexual activity: Yes    Partners: Female  Other Topics Concern  . 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 Drinks coffee daily. Drinks a lot of water daily. Delivers groceries to elderly as needed.   Social Determinants of Health   Financial Resource Strain:   . Difficulty of Paying Living Expenses: Not on file  Food Insecurity:   . Worried About Charity fundraiser in the Last Year: Not on file  . Ran Out of Food in the Last Year: Not on file  Transportation Needs:   . Lack of Transportation (Medical): Not on file  . Lack of Transportation (Non-Medical): Not on file  Physical Activity:   . Days of Exercise per Week: Not on file  . Minutes of Exercise per Session: Not on file  Stress:   . Feeling of Stress : Not on file  Social Connections:   . Frequency of Communication with Friends and Family: Not on file  . Frequency of Social Gatherings with Friends and Family: Not on file  . Attends Religious Services: Not on file  . Active Member of Clubs or Organizations: Not on file  . Attends Archivist Meetings: Not on file  . Marital Status: Not on file    Family History  Problem Relation Age of Onset  . Lung disease Mother   . Hypertension Mother   .  Lung disease Father   . Alcoholism Father     Health Maintenance  Topic Date Due  . COLONOSCOPY  11/06/2020  . TETANUS/TDAP  05/29/2026  . INFLUENZA VACCINE  Completed  . Hepatitis C Screening  Completed  . PNA vac Low Risk Adult  Completed    ----------------------------------------------------------------------------------------------------------------------------------------------------------------------------------------------------------------- Physical Exam BP 126/79   Pulse 76   Ht 5\' 9"  (1.753 m)   Wt 285 lb (129.3 kg)   BMI 42.09 kg/m   Physical Exam HENT:     Head: Normocephalic and atraumatic.  Eyes:     General: No scleral icterus. Cardiovascular:     Rate and Rhythm: Normal rate and regular rhythm.  Pulmonary:     Effort: Pulmonary effort is normal.      Breath sounds: Normal breath sounds.  Musculoskeletal:     Cervical back: Neck supple.  Neurological:     General: No focal deficit present.  Psychiatric:        Mood and Affect: Mood normal.        Behavior: Behavior normal.     ------------------------------------------------------------------------------------------------------------------------------------------------------------------------------------------------------------------- Assessment and Plan  HTN (hypertension) Blood pressure is at goal at for age and co-morbidities.  I recommend that he continue current dose of lisinopril-hctz.  In addition they were instructed to follow a low sodium diet with regular exercise to help to maintain adequate control of blood pressure.    BPH (benign prostatic hyperplasia) LUT's are well managed with flomax.  Recent PSA 1.2  HLD (hyperlipidemia) Lab Results  Component Value Date   LDLCALC 57 10/06/2019  Continue current dose of atorvastatin  Vitamin D deficiency Currently on high dose vitamin d weekly.  Levels improved.     This visit occurred during the SARS-CoV-2 public health emergency.  Safety protocols were in place, including screening questions prior to the visit, additional usage of staff PPE, and extensive cleaning of exam room while observing appropriate contact time as indicated for disinfecting solutions.

## 2019-10-28 ENCOUNTER — Other Ambulatory Visit: Payer: Self-pay | Admitting: Family Medicine

## 2019-10-28 NOTE — Telephone Encounter (Signed)
Previously written by Dr Georgina Snell. Patient has establishes care with you.   Please send if appropriate

## 2019-11-07 ENCOUNTER — Other Ambulatory Visit: Payer: Self-pay

## 2019-11-07 ENCOUNTER — Telehealth (INDEPENDENT_AMBULATORY_CARE_PROVIDER_SITE_OTHER): Payer: Medicare PPO | Admitting: Family Medicine

## 2019-11-07 ENCOUNTER — Encounter (INDEPENDENT_AMBULATORY_CARE_PROVIDER_SITE_OTHER): Payer: Self-pay | Admitting: Family Medicine

## 2019-11-07 DIAGNOSIS — Z6841 Body Mass Index (BMI) 40.0 and over, adult: Secondary | ICD-10-CM

## 2019-11-07 DIAGNOSIS — E559 Vitamin D deficiency, unspecified: Secondary | ICD-10-CM | POA: Diagnosis not present

## 2019-11-07 MED ORDER — VITAMIN D (ERGOCALCIFEROL) 1.25 MG (50000 UNIT) PO CAPS: 50000.0000 [IU] | ORAL_CAPSULE | ORAL | 0 refills | Status: DC

## 2019-11-07 NOTE — Progress Notes (Signed)
TeleHealth Visit:  Due to the COVID-19 pandemic, this visit was completed with telemedicine (audio/video) technology to reduce patient and provider exposure as well as to preserve personal protective equipment.   John Morrow has verbally consented to this TeleHealth visit. The patient is located at home, the provider is located at the [facility] office. The participants in this visit include the listed provider and patient. The visit was conducted today via telephone call (Doxy[employer]me worked briefly and then failed - changed to telephone call).  Chief Complaint: OBESITY [name] is here to discuss his progress with his obesity treatment plan along with follow-up of his obesity related diagnoses. [name] is on the Category 3 Plan and states he is following his eating plan approximately 75% of the time. Azarian states he is exercise for 0 minutes 0 times per week.  Today's visit was #: 20 Starting weight: 277 lbs Starting date: 09/23/2018  Interim History: [name] continues to do well with weight loss.  He is happy with his diet plan and states hunger is controlled.  He is doing well meeting his protein goal.     Subjective:   1. Vitamin D deficiency John Morrow's Vitamin D level was 75 on 10/07/2019. He is currently taking vit D. He denies nausea, vomiting or muscle weakness.  Patient's vitamin D level is at goal at 65.  He is at risk of over-replacement when the days get longer and he spends more time outside.  Assessment/Plan:   1. Vitamin D deficiency Low Vitamin D level contributes to fatigue and are associated with obesity, breast, and colon cancer. He agrees to continue to take prescription Vitamin D @50 ,000 IU every week and will follow-up for routine testing of Vitamin D, at least 2-3 times per year to avoid over-replacement.  Will plan to decrease vitamin D dose next month. - Vitamin D, Ergocalciferol, (DRISDOL) 1.25 MG (50000 UNIT) CAPS capsule; Take 1 capsule (50,000 Units total) by  mouth every 7 (seven) days.  Dispense: 4 capsule; Refill: 0  2. Class 3 severe obesity with serious comorbidity and body mass index (BMI) of 40.0 to 44.9 in adult, unspecified obesity type [facility]) [name] is currently in the action stage of change. As such, his goal is to continue with weight loss efforts. He has agreed to the Category 3 Plan.   Exercise goals: Start back to walking when weather is improved.  Behavioral modification strategies: increasing lean protein intake and meal planning and cooking strategies.  [name] has agreed to follow-up with our clinic in 4 weeks. He was informed of the importance of frequent follow-up visits to maximize his success with intensive lifestyle modifications for his multiple health conditions.  Objective:   VITALS: Per patient if applicable, see vitals. GENERAL: Alert and in no acute distress. CARDIOPULMONARY: No increased WOB. Speaking in clear sentences.  PSYCH: Pleasant and cooperative. Speech normal rate and rhythm. Affect is appropriate. Insight and judgement are appropriate. Attention is focused, linear, and appropriate.  NEURO: Oriented as arrived to appointment on time with no prompting.   Lab Results  Component Value Date   CREATININE 1.4 (A) 10/07/2019   BUN 26 (A) 10/07/2019   NA 142 10/07/2019   K 3.7 10/07/2019   CL 106 10/07/2019   CO2 29 (A) 10/07/2019   Lab Results  Component Value Date   ALT 19 10/07/2019   AST 17 10/07/2019   ALKPHOS 57 10/07/2019   BILITOT 0.7 10/06/2019   Lab Results  Component Value Date  HGBA1C 5.3 10/07/2019   HGBA1C 5.3 10/06/2019   HGBA1C 5.5 05/06/2019   HGBA1C 5.4 09/27/2018   HGBA1C 5.4 06/05/2016   Lab Results  Component Value Date   INSULIN 17.7 09/27/2018   Lab Results  Component Value Date   TSH 1.040 09/27/2018   Lab Results  Component Value Date   CHOL 112 10/06/2019   HDL 37 (L) 10/06/2019   LDLCALC 57 10/06/2019   TRIG 92 10/06/2019   CHOLHDL 3.0 10/06/2019   Lab  Results  Component Value Date   WBC 5.7 09/27/2018   HGB 15.0 09/27/2018   HCT 44.4 09/27/2018   MCV 90 09/27/2018   PLT 301 04/05/2018   Attestation Statements:   Reviewed by clinician on day of visit: allergies, medications, problem list, medical history, surgical history, family history, social history, and previous encounter notes.  I, Water quality scientist, CMA, am acting as transcriptionist for Dennard Nip, MD.  I have reviewed the above documentation for accuracy and completeness, and I agree with the above. - Dennard Nip, MD

## 2019-12-05 DIAGNOSIS — L578 Other skin changes due to chronic exposure to nonionizing radiation: Secondary | ICD-10-CM | POA: Diagnosis not present

## 2019-12-05 DIAGNOSIS — L570 Actinic keratosis: Secondary | ICD-10-CM | POA: Diagnosis not present

## 2019-12-05 DIAGNOSIS — C44622 Squamous cell carcinoma of skin of right upper limb, including shoulder: Secondary | ICD-10-CM | POA: Diagnosis not present

## 2019-12-19 ENCOUNTER — Other Ambulatory Visit: Payer: Self-pay | Admitting: Physician Assistant

## 2019-12-19 DIAGNOSIS — E6609 Other obesity due to excess calories: Secondary | ICD-10-CM

## 2019-12-21 ENCOUNTER — Other Ambulatory Visit: Payer: Self-pay | Admitting: Physician Assistant

## 2019-12-21 DIAGNOSIS — E6609 Other obesity due to excess calories: Secondary | ICD-10-CM

## 2019-12-21 DIAGNOSIS — Z6839 Body mass index (BMI) 39.0-39.9, adult: Secondary | ICD-10-CM

## 2020-01-09 DIAGNOSIS — H2513 Age-related nuclear cataract, bilateral: Secondary | ICD-10-CM | POA: Diagnosis not present

## 2020-01-09 DIAGNOSIS — H524 Presbyopia: Secondary | ICD-10-CM | POA: Diagnosis not present

## 2020-01-10 DIAGNOSIS — G4733 Obstructive sleep apnea (adult) (pediatric): Secondary | ICD-10-CM | POA: Diagnosis not present

## 2020-01-16 ENCOUNTER — Other Ambulatory Visit: Payer: Self-pay | Admitting: Physician Assistant

## 2020-01-16 DIAGNOSIS — E6609 Other obesity due to excess calories: Secondary | ICD-10-CM

## 2020-02-12 ENCOUNTER — Encounter: Payer: Self-pay | Admitting: Emergency Medicine

## 2020-02-12 ENCOUNTER — Emergency Department (INDEPENDENT_AMBULATORY_CARE_PROVIDER_SITE_OTHER)
Admission: EM | Admit: 2020-02-12 | Discharge: 2020-02-12 | Disposition: A | Discharge status: Home / Self Care | Payer: Medicare PPO | Source: Home / Self Care

## 2020-02-12 ENCOUNTER — Other Ambulatory Visit: Payer: Self-pay

## 2020-02-12 DIAGNOSIS — Z87438 Personal history of other diseases of male genital organs: Secondary | ICD-10-CM

## 2020-02-12 DIAGNOSIS — R300 Dysuria: Secondary | ICD-10-CM

## 2020-02-12 DIAGNOSIS — R350 Frequency of micturition: Secondary | ICD-10-CM

## 2020-02-12 LAB — POCT URINALYSIS DIP (MANUAL ENTRY)
Bilirubin, UA: NEGATIVE
Glucose, UA: NEGATIVE mg/dL
Ketones, POC UA: NEGATIVE mg/dL
Leukocytes, UA: NEGATIVE
Nitrite, UA: NEGATIVE
Protein Ur, POC: NEGATIVE mg/dL
Spec Grav, UA: 1.025 (ref 1.010–1.025)
Urobilinogen, UA: 0.2 E.U./dL
pH, UA: 5.5 (ref 5.0–8.0)

## 2020-02-12 MED ORDER — CIPROFLOXACIN HCL 250 MG PO TABS: 250.0000 mg | ORAL_TABLET | Freq: Two times a day (BID) | ORAL | 0 refills | Status: AC

## 2020-02-12 NOTE — Discharge Instructions (Signed)
  Please take antibiotics as prescribed and be sure to complete entire course even if you start to feel better to ensure infection does not come back.  Please follow up with Dr. Zigmund Daniel as previously scheduled, or follow up sooner if symptoms worsening.  Be sure to stay well hydrated.

## 2020-02-12 NOTE — ED Provider Notes (Signed)
[name]    CSN: [id number] Arrival date & time: 02/12/20  0946      History   Chief Complaint Chief Complaint  Patient presents with  . Dysuria    HPI John Morrow is a 69 y.o. male.   HPI  John Morrow is a 69 y.o. male presenting to UC with c/o several weeks of dysuria and urinary urgency and frequency that worsened over the last 1 week.  Pt's wife encouraged him to be evaluated today before they leave on a road trip up [place].  He has a hx of BPH and prostatitis.  Last episode was in 2017.  He was treated with cipro and did well. He currently takes Flomax daily.  Denies fever, chills, n/v/d. No hx of kidney stones.  He has f/u with hx PCP March 16, 2020.    Past Medical History:  Diagnosis Date  . Back pain   . Diverticulosis of colon without hemorrhage 06/12/2016   On Colonoscopy 11/06/2010  . HLD (hyperlipidemia) 05/29/2016  . Hyperlipemia   . Hypertension   . Joint pain   . Morbid obesity ([place]) 05/29/2016  . Prediabetes   . Right hip pain    needs hip replacement  . Sleep apnea     Patient Active Problem List   Diagnosis Date Noted  . Vitamin D deficiency 02/08/2019  . Insulin resistance 02/08/2019  . Class 2 severe obesity with serious comorbidity and body mass index (BMI) of 39.0 to 39.9 in adult [facility]) 02/08/2019  . Chronic pain 09/28/2018  . Hip pain 06/28/2018  . Dependent edema 05/05/2017  . Bilateral knee pain 09/25/2016  . BPH (benign prostatic hyperplasia) 05/29/2016  . Urine frequency 05/29/2016  . HTN (hypertension) 05/29/2016  . HLD (hyperlipidemia) 05/29/2016  . Class 2 obesity due to excess calories without serious comorbidity with body mass index (BMI) of 39.0 to 39.9 in adult 05/29/2016    Past Surgical History:  Procedure Laterality Date  . KNEE SURGERY    . REPLACEMENT TOTAL HIP W/  RESURFACING IMPLANTS Right [id number]   Dr. Aleda Grana       Home Medications    Prior to Admission medications   Medication Sig Start Date  End Date Taking? Authorizing Provider  atorvastatin (LIPITOR) 40 MG tablet Take 1 tablet (40 mg total) by mouth daily. Patient taking differently: Take 20 mg by mouth daily.  10/27/18  Yes Gregor Hams, MD  buPROPion (WELLBUTRIN XL) 150 MG 24 hr tablet Take 1 tablet by mouth once daily 01/16/20  Yes Luetta Nutting, DO  lisinopril-hydrochlorothiazide (ZESTORETIC) 20-25 MG tablet Take 1 tablet by mouth once daily 08/01/19  Yes Breeback, Jade L, PA-C  tamsulosin (FLOMAX) 0.4 MG CAPS capsule Take 1 capsule by mouth once daily 10/28/19  Yes Luetta Nutting, DO  Vitamin D, Ergocalciferol, (DRISDOL) 1.25 MG (50000 UNIT) CAPS capsule Take 1 capsule (50,000 Units total) by mouth every 7 (seven) days. 11/07/19  Yes Dennard Nip D, MD  ciprofloxacin (CIPRO) 250 MG tablet Take 1 tablet (250 mg total) by mouth every 12 (twelve) hours for 10 days. 02/12/20 02/22/20  Noe Gens, PA-C    Family History Family History  Problem Relation Age of Onset  . Lung disease Mother   . Hypertension Mother   . Lung disease Father   . Alcoholism Father     Social History Social History   Tobacco Use  . Smoking status: Never Smoker  . Smokeless tobacco: Never Used  Substance Use Topics  .  Alcohol use: Not Currently  . Drug use: No     Allergies   Morphine and related   Review of Systems Review of Systems  Constitutional: Negative for chills and fever.  Gastrointestinal: Positive for abdominal pain (right side bladder pressure). Negative for diarrhea, nausea and vomiting.  Genitourinary: Positive for dysuria, frequency and urgency. Negative for decreased urine volume and flank pain.  Musculoskeletal: Negative for back pain and myalgias.     Physical Exam Triage Vital Signs ED Triage Vitals [02/12/20 1001]  Enc Vitals Group     BP (!) 142/92     Pulse Rate 69     Resp 17     Temp 98.8 F (37.1 C)     Temp Source Oral     SpO2 98 %     Weight      Height      Head Circumference      Peak Flow        Pain Score      Pain Loc      Pain Edu?      Excl. in [place]?    No data found.  Updated Vital Signs BP (!) 142/92 (BP Location: Right Arm)   Pulse 69   Temp 98.8 F (37.1 C) (Oral)   Resp 17   SpO2 98%   Visual Acuity Right Eye Distance:   Left Eye Distance:   Bilateral Distance:    Right Eye Near:   Left Eye Near:    Bilateral Near:     Physical Exam Vitals and nursing note reviewed.  Constitutional:      Appearance: Normal appearance. He is well-developed.  HENT:     Head: Normocephalic and atraumatic.  Cardiovascular:     Rate and Rhythm: Normal rate and regular rhythm.  Pulmonary:     Effort: Pulmonary effort is normal.     Breath sounds: Normal breath sounds.  Abdominal:     General: There is no distension.     Palpations: Abdomen is soft.     Tenderness: There is abdominal tenderness. There is no right CVA tenderness, left CVA tenderness, guarding or rebound. Negative signs include Murphy's sign and McBurney's sign.    Musculoskeletal:        General: Normal range of motion.     Cervical back: Normal range of motion.  Skin:    General: Skin is warm and dry.  Neurological:     Mental Status: He is alert and oriented to person, place, and time.  Psychiatric:        Behavior: Behavior normal.      UC Treatments / Results  Labs (all labs ordered are listed, but only abnormal results are displayed) Labs Reviewed  POCT URINALYSIS DIP (MANUAL ENTRY) - Abnormal; Notable for the following components:      Result Value   Blood, UA trace-intact (*)    All other components within normal limits  URINE CULTURE    EKG   Radiology No results found.  Procedures Procedures (including critical care time)  Medications Ordered in UC Medications - No data to display  Initial Impression / Assessment and Plan / UC Course  I have reviewed the triage vital signs and the nursing notes.  Pertinent labs & imaging results that were available during my care of  the patient were reviewed by me and considered in my medical decision making (see chart for details).     UA: no definite UTI Culture sent Given reported symptoms and  prior hx of prostatitis, will start pt on Cipro Encouraged f/u with PCP  AVS given  Final Clinical Impressions(s) / UC Diagnoses   Final diagnoses:  Dysuria  Urinary frequency  History of prostatitis     Discharge Instructions      Please take antibiotics as prescribed and be sure to complete entire course even if you start to feel better to ensure infection does not come back.  Please follow up with Dr. Zigmund Daniel as previously scheduled, or follow up sooner if symptoms worsening.  Be sure to stay well hydrated.     ED Prescriptions    Medication Sig Dispense Auth. Provider   ciprofloxacin (CIPRO) 250 MG tablet Take 1 tablet (250 mg total) by mouth every 12 (twelve) hours for 10 days. 20 tablet Noe Gens, [place]     PDMP not reviewed this encounter.   Noe Gens, [place] 02/12/20 1117

## 2020-02-12 NOTE — ED Triage Notes (Signed)
C/O of dysuria x 1 week - pt's wife made him come in today - urgency & frequency Hx of prostatitis - denies same feeling Denies any back pain  Bladder pressure  w/ Right sided pain No hx of kidney stones Covid vaccine - [employer]

## 2020-02-13 ENCOUNTER — Emergency Department (HOSPITAL_BASED_OUTPATIENT_CLINIC_OR_DEPARTMENT_OTHER): Payer: Medicare PPO

## 2020-02-13 ENCOUNTER — Encounter (HOSPITAL_BASED_OUTPATIENT_CLINIC_OR_DEPARTMENT_OTHER): Payer: Self-pay | Admitting: Emergency Medicine

## 2020-02-13 ENCOUNTER — Other Ambulatory Visit: Payer: Self-pay

## 2020-02-13 ENCOUNTER — Encounter: Payer: Self-pay | Admitting: Emergency Medicine

## 2020-02-13 ENCOUNTER — Emergency Department (HOSPITAL_BASED_OUTPATIENT_CLINIC_OR_DEPARTMENT_OTHER)
Admission: EM | Admit: 2020-02-13 | Discharge: 2020-02-13 | Disposition: A | Discharge status: Home / Self Care | Payer: Medicare PPO | Attending: Emergency Medicine | Admitting: Emergency Medicine

## 2020-02-13 ENCOUNTER — Emergency Department
Admission: EM | Admit: 2020-02-13 | Discharge: 2020-02-13 | Disposition: A | Discharge status: Home / Self Care | Payer: Medicare PPO | Source: Home / Self Care | Attending: Family Medicine | Admitting: Family Medicine

## 2020-02-13 DIAGNOSIS — Z79899 Other long term (current) drug therapy: Secondary | ICD-10-CM | POA: Diagnosis not present

## 2020-02-13 DIAGNOSIS — E785 Hyperlipidemia, unspecified: Secondary | ICD-10-CM | POA: Insufficient documentation

## 2020-02-13 DIAGNOSIS — R7303 Prediabetes: Secondary | ICD-10-CM | POA: Insufficient documentation

## 2020-02-13 DIAGNOSIS — I10 Essential (primary) hypertension: Secondary | ICD-10-CM | POA: Insufficient documentation

## 2020-02-13 DIAGNOSIS — Z885 Allergy status to narcotic agent status: Secondary | ICD-10-CM | POA: Insufficient documentation

## 2020-02-13 DIAGNOSIS — K388 Other specified diseases of appendix: Secondary | ICD-10-CM | POA: Diagnosis not present

## 2020-02-13 DIAGNOSIS — R109 Unspecified abdominal pain: Secondary | ICD-10-CM | POA: Diagnosis not present

## 2020-02-13 DIAGNOSIS — K6389 Other specified diseases of intestine: Secondary | ICD-10-CM

## 2020-02-13 DIAGNOSIS — R1031 Right lower quadrant pain: Secondary | ICD-10-CM | POA: Diagnosis not present

## 2020-02-13 LAB — COMPREHENSIVE METABOLIC PANEL
ALT: 19 U/L (ref 0–44)
AST: 17 U/L (ref 15–41)
Albumin: 3.7 g/dL (ref 3.5–5.0)
Alkaline Phosphatase: 60 U/L (ref 38–126)
Anion gap: 9 (ref 5–15)
BUN: 22 mg/dL (ref 8–23)
CO2: 27 mmol/L (ref 22–32)
Calcium: 9.1 mg/dL (ref 8.9–10.3)
Chloride: 103 mmol/L (ref 98–111)
Creatinine, Ser: 1.22 mg/dL (ref 0.61–1.24)
GFR calc Af Amer: >60 mL/min (ref >60)
GFR calc non Af Amer: 60 mL/min — ABNORMAL LOW (ref >60)
Glucose, Bld: 104 mg/dL — ABNORMAL HIGH (ref 70–99)
Potassium: 3.7 mmol/L (ref 3.5–5.1)
Sodium: 139 mmol/L (ref 135–145)
Total Bilirubin: 1 mg/dL (ref 0.3–1.2)
Total Protein: 6.9 g/dL (ref 6.5–8.1)

## 2020-02-13 LAB — CBC WITH DIFFERENTIAL/PLATELET
Abs Immature Granulocytes: 0.05 10*3/uL (ref 0.00–0.07)
Basophils Absolute: 0 10*3/uL (ref 0.0–0.1)
Basophils Relative: 1 %
Eosinophils Absolute: 0.1 10*3/uL (ref 0.0–0.5)
Eosinophils Relative: 1 %
HCT: 45.6 % (ref 39.0–52.0)
Hemoglobin: 15.5 g/dL (ref 13.0–17.0)
Immature Granulocytes: 1 %
Lymphocytes Relative: 13 %
Lymphs Abs: 1.1 10*3/uL (ref 0.7–4.0)
MCH: 29.5 pg (ref 26.0–34.0)
MCHC: 34 g/dL (ref 30.0–36.0)
MCV: 86.7 fL (ref 80.0–100.0)
Monocytes Absolute: 0.9 10*3/uL (ref 0.1–1.0)
Monocytes Relative: 11 %
Neutro Abs: 6.4 10*3/uL (ref 1.7–7.7)
Neutrophils Relative %: 73 %
Platelets: 211 10*3/uL (ref 150–400)
RBC: 5.26 MIL/uL (ref 4.22–5.81)
RDW: 13 % (ref 11.5–15.5)
WBC: 8.6 10*3/uL (ref 4.0–10.5)
nRBC: 0 % (ref 0.0–0.2)

## 2020-02-13 LAB — URINALYSIS, MICROSCOPIC (REFLEX)

## 2020-02-13 LAB — URINE CULTURE
MICRO NUMBER:: 10537005
Result:: NO GROWTH
SPECIMEN QUALITY:: ADEQUATE

## 2020-02-13 LAB — URINALYSIS, ROUTINE W REFLEX MICROSCOPIC
Bilirubin Urine: NEGATIVE
Glucose, UA: NEGATIVE mg/dL
Ketones, ur: NEGATIVE mg/dL
Leukocytes,Ua: NEGATIVE
Nitrite: NEGATIVE
Protein, ur: NEGATIVE mg/dL
Specific Gravity, Urine: 1.02 (ref 1.005–1.030)
pH: 5.5 (ref 5.0–8.0)

## 2020-02-13 LAB — LIPASE, BLOOD: Lipase: 22 U/L (ref 11–51)

## 2020-02-13 MED ORDER — IOHEXOL 300 MG/ML  SOLN
100.0000 mL | Freq: Once | INTRAMUSCULAR | Status: AC | PRN
  Administered 2020-02-13: 100 mL via INTRAVENOUS

## 2020-02-13 MED ORDER — OXYCODONE-ACETAMINOPHEN 5-325 MG PO TABS: 1.0000 | ORAL_TABLET | Freq: Four times a day (QID) | ORAL | 0 refills | Status: DC | PRN

## 2020-02-13 NOTE — ED Triage Notes (Signed)
At urgent care in [place] with abdominal pain, instructed to come here for CT scan, pain started couple of weeks ago, pain in RLQ, denies nausea/vomiting

## 2020-02-13 NOTE — ED Provider Notes (Signed)
[facility] Provider Note   CSN: [id number] Arrival date & time: 02/13/20  [id number]     History No chief complaint on file.   John Morrow is a 69 y.o. male presenting for evaluation of lower abdominal pain.  Patient states that the past several weeks, he has had persistent right lower abdominal pain.  It is constant, and was severe last night.  It is slightly improved after urination, however promptly returned.  It does not radiate anywhere.  He has not taken anything for it including Tylenol ibuprofen.  He was seen at urgent care earlier today, sent to the ER for CT scan.  He was seen yesterday at the urgent care as well, at that time reported urinary frequency and dysuria, and he was started on Cipro.  He states he has had 3 doses without improvement of his symptoms/abd pain. He denies fevers, chills, nausea, vomiting, dysuria, hematuria, or abnormal bowel movements.  He reports a history of diverticulitis, and states his pain feels similar although last time pain was on his left side.  Reports associated history of hypertension for which he takes medication.  No previous history of abdominal problems or abdominal surgeries.  His last colonoscopy was several years ago, was normal.  Additional history taken from chart review.  Patient with a history of diverticulosis, HLD, hypertension, prediabetes.  HPI     Past Medical History:  Diagnosis Date  . Back pain   . Diverticulosis of colon without hemorrhage 06/12/2016   On Colonoscopy 11/06/2010  . HLD (hyperlipidemia) 05/29/2016  . Hyperlipemia   . Hypertension   . Joint pain   . Morbid obesity ([place]) 05/29/2016  . Prediabetes   . Right hip pain    needs hip replacement  . Sleep apnea     Patient Active Problem List   Diagnosis Date Noted  . Vitamin D deficiency 02/08/2019  . Insulin resistance 02/08/2019  . Class 2 severe obesity with serious comorbidity and body mass index (BMI) of 39.0 to 39.9 in  adult [facility]) 02/08/2019  . Chronic pain 09/28/2018  . Hip pain 06/28/2018  . Dependent edema 05/05/2017  . Bilateral knee pain 09/25/2016  . BPH (benign prostatic hyperplasia) 05/29/2016  . Urine frequency 05/29/2016  . HTN (hypertension) 05/29/2016  . HLD (hyperlipidemia) 05/29/2016  . Class 2 obesity due to excess calories without serious comorbidity with body mass index (BMI) of 39.0 to 39.9 in adult 05/29/2016    Past Surgical History:  Procedure Laterality Date  . KNEE SURGERY    . REPLACEMENT TOTAL HIP W/  RESURFACING IMPLANTS Right [id number]   Dr. Aleda Grana       Family History  Problem Relation Age of Onset  . Lung disease Mother   . Hypertension Mother   . Lung disease Father   . Alcoholism Father     Social History   Tobacco Use  . Smoking status: Never Smoker  . Smokeless tobacco: Never Used  Substance Use Topics  . Alcohol use: Not Currently  . Drug use: No    Home Medications Prior to Admission medications   Medication Sig Start Date End Date Taking? Authorizing Provider  atorvastatin (LIPITOR) 40 MG tablet Take 1 tablet (40 mg total) by mouth daily. Patient taking differently: Take 20 mg by mouth daily.  10/27/18   Gregor Hams, MD  buPROPion (WELLBUTRIN XL) 150 MG 24 hr tablet Take 1 tablet by mouth once daily 01/16/20   Luetta Nutting, DO  ciprofloxacin (CIPRO)  250 MG tablet Take 1 tablet (250 mg total) by mouth every 12 (twelve) hours for 10 days. 02/12/20 02/22/20  Noe Gens, PA-C  lisinopril-hydrochlorothiazide (ZESTORETIC) 20-25 MG tablet Take 1 tablet by mouth once daily 08/01/19   Breeback, Jade L, PA-C  oxyCODONE-acetaminophen (PERCOCET/ROXICET) 5-325 MG tablet Take 1 tablet by mouth every 6 (six) hours as needed for severe pain. 02/13/20   Hailynn Slovacek, PA-C  tamsulosin (FLOMAX) 0.4 MG CAPS capsule Take 1 capsule by mouth once daily 10/28/19   Luetta Nutting, DO  Vitamin D, Ergocalciferol, (DRISDOL) 1.25 MG (50000 UNIT) CAPS capsule Take 1 capsule  (50,000 Units total) by mouth every 7 (seven) days. 11/07/19   Dennard Nip D, MD    Allergies    Morphine and related  Review of Systems   Review of Systems  Gastrointestinal: Positive for abdominal pain.  All other systems reviewed and are negative.   Physical Exam Updated Vital Signs BP (!) 138/91 (BP Location: Right Arm)   Pulse 76   Temp 98 F (36.7 C) (Oral)   Resp 18   Ht 5\' 9"  (1.753 m)   Wt 99.3 kg   SpO2 98%   BMI 32.34 kg/m   Physical Exam Vitals and nursing note reviewed.  Constitutional:      General: He is not in acute distress.    Appearance: He is well-developed.     Comments: Resting in the bed in no acute distress  HENT:     Head: Normocephalic and atraumatic.  Eyes:     Conjunctiva/sclera: Conjunctivae normal.     Pupils: Pupils are equal, round, and reactive to light.  Cardiovascular:     Rate and Rhythm: Normal rate and regular rhythm.     Pulses: Normal pulses.  Pulmonary:     Effort: Pulmonary effort is normal. No respiratory distress.     Breath sounds: Normal breath sounds. No wheezing.  Abdominal:     General: Bowel sounds are normal. There is no distension.     Palpations: Abdomen is soft. There is no mass.     Tenderness: There is abdominal tenderness. There is no guarding or rebound.     Comments: Tenderness palpation of right lower quadrant abdomen.  Positive Rovsing's.  Negative rebound.  No peritonitis.  No tenderness palpation elsewhere in the abdomen.  No CVA tenderness.  Musculoskeletal:        General: Normal range of motion.     Cervical back: Normal range of motion and neck supple.  Skin:    General: Skin is warm and dry.     Capillary Refill: Capillary refill takes less than 2 seconds.  Neurological:     Mental Status: He is alert and oriented to person, place, and time.     ED Results / Procedures / Treatments   Labs (all labs ordered are listed, but only abnormal results are displayed) Labs Reviewed  COMPREHENSIVE  METABOLIC PANEL - Abnormal; Notable for the following components:      Result Value   Glucose, Bld 104 (*)    GFR calc non Af Amer 60 (*)    All other components within normal limits  URINALYSIS, ROUTINE W REFLEX MICROSCOPIC - Abnormal; Notable for the following components:   Hgb urine dipstick TRACE (*)    All other components within normal limits  URINALYSIS, MICROSCOPIC (REFLEX) - Abnormal; Notable for the following components:   Bacteria, UA FEW (*)    All other components within normal limits  URINE CULTURE  CBC WITH DIFFERENTIAL/PLATELET  LIPASE, BLOOD    EKG None  Radiology CT ABDOMEN PELVIS W CONTRAST  Result Date: 02/13/2020 CLINICAL DATA:  69 year old male with acute abdominal and pelvic pain. EXAM: CT ABDOMEN AND PELVIS WITH CONTRAST TECHNIQUE: Multidetector CT imaging of the abdomen and pelvis was performed using the standard protocol following bolus administration of intravenous contrast. CONTRAST:  161mL OMNIPAQUE IOHEXOL 300 MG/ML  SOLN COMPARISON:  04/05/2018 CT FINDINGS: Lower chest: No acute abnormality Hepatobiliary: The liver and gallbladder are unremarkable. No biliary dilatation. Pancreas: Unremarkable Spleen: Unremarkable Adrenals/Urinary Tract: The kidneys, adrenal glands and bladder are unremarkable except for unchanged renal cysts. Stomach/Bowel: There is moderate inflammation within the anterior low pelvic fat, which lies anterior to the mid sigmoid colon. Although there are sigmoid colonic diverticula in this area, no wall thickening or inflamed diverticula are identified. There is a possible fatty structure in the center of this inflammation which likely represents epiploic appendagitis rather than diverticulitis. No bowel wall thickening, bowel obstruction or other inflammatory changes noted. The appendix is normal. Vascular/Lymphatic: Aortic atherosclerosis. No enlarged abdominal or pelvic lymph nodes. Reproductive: Mild prostate enlargement noted. Other: No  ascites, focal collection or pneumoperitoneum identified. Musculoskeletal: No acute or suspicious bony abnormalities are present. RIGHT total hip arthroplasty and lumbar spine degenerative changes again identified. IMPRESSION: 1. Moderate inflammation within the anterior low pelvic fat, likely representing epiploic appendagitis. Acute diverticulitis is considered much less likely given findings. 2. Aortic Atherosclerosis (ICD10-I70.0). Electronically Signed   By: Margarette Canada M.D.   On: 02/13/2020 12:31    Procedures Procedures (including critical care time)  Medications Ordered in ED Medications  iohexol (OMNIPAQUE) 300 MG/ML solution 100 mL (100 mLs Intravenous Contrast Given 02/13/20 1204)    ED Course  I have reviewed the triage vital signs and the nursing notes.  Pertinent labs & imaging results that were available during my care of the patient were reviewed by me and considered in my medical decision making (see chart for details).    MDM Rules/Calculators/A&P                      Pt presenting for evaluation of right lower quadrant abdominal pain.  On exam, patient appears nontoxic.  However he does have focal right lower quadrant abdominal pain with a positive Rovsing's.  Concern for possible appendicitis, though less likely in the setting of several weeks.  Also consider UTI/early [place].  Consider kidney stone.  Consider atypical location for diverticulitis.  Will obtain labs and CT abdomen pelvis for further evaluation.  Labs interpreted by me, overall reassuring.  No leukocytosis.  Kidney, liver, pancreatic function normal.  Urine shows trace blood, but no leuk trase or leuks.  Only few bacteria.  Sent for culture.  CT abdomen pelvis consistent with epiploic appendagitis.  Less likely diverticulitis.  Discussed findings with patient.  Discussed symptomatic treatment (pain control).  Discussed follow-up with primary care if symptoms not improving.  Case discussed with attending, Dr.  Sherry Ruffing evaluated the patient.  At this time, patient appears safe for discharge.  Return precautions given.  Patient states he understands and agrees plan.  Final Clinical Impression(s) / ED Diagnoses Final diagnoses:  Epiploic appendagitis    Rx / DC Orders ED Discharge Orders         Ordered    oxyCODONE-acetaminophen (PERCOCET/ROXICET) 5-325 MG tablet  Every 6 hours PRN     02/13/20 1302           Therron Sells,  Jillyn Ledger, PA-C 02/13/20 1317    Tegeler, Gwenyth Allegra, MD 02/14/20 1149

## 2020-02-13 NOTE — ED Provider Notes (Signed)
[name]    CSN: [id number] Arrival date & time: 02/13/20  0835      History   Chief Complaint Chief Complaint  Patient presents with  . Dysuria  . Abdominal Pain    RLQ    HPI John Morrow is a 69 y.o. male.   Patient was evaluated here yesterday with complaint of several weeks of dysuria and urinary urgency and frequency that worsened over the past week.  He has a history of prostatitis and was treated empirically with Cipro 250mg  BID.  He presents today complaining of increasing right lower quadrant pain which he admits has been present for several weeks, and kept him awake last night.  His symptoms have not improved after 3 doses of Cipro.  He has a past history of diverticulitis. He denies fevers, chills, and sweats, nausea/vomiting, and changes in bowel movements.      John Morrow is a 69 y.o. male presenting to UC with c/o several weeks of dysuria and urinary urgency and frequency that worsened over the last 1   The history is provided by the patient.  Abdominal Pain Pain location:  RLQ Pain quality: stabbing   Pain radiates to:  Does not radiate Pain severity:  Moderate Onset quality:  Gradual Duration:  3 weeks Timing:  Constant Progression:  Worsening Chronicity:  New Context: awakening from sleep   Context: not diet changes, not eating, not previous surgeries, not recent illness, not recent travel and not suspicious food intake   Relieved by:  Nothing Exacerbated by: standing from sitting position. Ineffective treatments: Cipro. Associated symptoms: dysuria   Associated symptoms: no anorexia, no belching, no chest pain, no chills, no constipation, no cough, no diarrhea, no fatigue, no fever, no flatus, no hematemesis, no hematochezia, no hematuria, no melena, no nausea and no vomiting   Risk factors: has not had multiple surgeries     Past Medical History:  Diagnosis Date  . Back pain   . Diverticulosis of colon without hemorrhage  06/12/2016   On Colonoscopy 11/06/2010  . HLD (hyperlipidemia) 05/29/2016  . Hyperlipemia   . Hypertension   . Joint pain   . Morbid obesity ([place]) 05/29/2016  . Prediabetes   . Right hip pain    needs hip replacement  . Sleep apnea     Patient Active Problem List   Diagnosis Date Noted  . Vitamin D deficiency 02/08/2019  . Insulin resistance 02/08/2019  . Class 2 severe obesity with serious comorbidity and body mass index (BMI) of 39.0 to 39.9 in adult [facility]) 02/08/2019  . Chronic pain 09/28/2018  . Hip pain 06/28/2018  . Dependent edema 05/05/2017  . Bilateral knee pain 09/25/2016  . BPH (benign prostatic hyperplasia) 05/29/2016  . Urine frequency 05/29/2016  . HTN (hypertension) 05/29/2016  . HLD (hyperlipidemia) 05/29/2016  . Class 2 obesity due to excess calories without serious comorbidity with body mass index (BMI) of 39.0 to 39.9 in adult 05/29/2016    Past Surgical History:  Procedure Laterality Date  . KNEE SURGERY    . REPLACEMENT TOTAL HIP W/  RESURFACING IMPLANTS Right [id number]   Dr. Aleda Grana       Home Medications    Prior to Admission medications   Medication Sig Start Date End Date Taking? Authorizing Provider  atorvastatin (LIPITOR) 40 MG tablet Take 1 tablet (40 mg total) by mouth daily. Patient taking differently: Take 20 mg by mouth daily.  10/27/18  Yes Gregor Hams, MD  buPROPion (WELLBUTRIN XL)  150 MG 24 hr tablet Take 1 tablet by mouth once daily 01/16/20  Yes Luetta Nutting, DO  ciprofloxacin (CIPRO) 250 MG tablet Take 1 tablet (250 mg total) by mouth every 12 (twelve) hours for 10 days. 02/12/20 02/22/20 Yes Noe Gens, PA-C  lisinopril-hydrochlorothiazide (ZESTORETIC) 20-25 MG tablet Take 1 tablet by mouth once daily 08/01/19  Yes Breeback, Jade L, PA-C  tamsulosin (FLOMAX) 0.4 MG CAPS capsule Take 1 capsule by mouth once daily 10/28/19  Yes Luetta Nutting, DO  Vitamin D, Ergocalciferol, (DRISDOL) 1.25 MG (50000 UNIT) CAPS capsule Take 1 capsule  (50,000 Units total) by mouth every 7 (seven) days. 11/07/19  Yes Dennard Nip D, MD    Family History Family History  Problem Relation Age of Onset  . Lung disease Mother   . Hypertension Mother   . Lung disease Father   . Alcoholism Father     Social History Social History   Tobacco Use  . Smoking status: Never Smoker  . Smokeless tobacco: Never Used  Substance Use Topics  . Alcohol use: Not Currently  . Drug use: No     Allergies   Morphine and related   Review of Systems Review of Systems  Constitutional: Negative for chills, diaphoresis, fatigue and fever.  Respiratory: Negative for cough.   Cardiovascular: Negative for chest pain.  Gastrointestinal: Positive for abdominal pain. Negative for anorexia, constipation, diarrhea, flatus, hematemesis, hematochezia, melena, nausea and vomiting.  Genitourinary: Positive for dysuria, frequency and urgency. Negative for discharge, flank pain, hematuria, penile pain, penile swelling, scrotal swelling and testicular pain.  Musculoskeletal: Negative for myalgias.  Neurological: Negative for headaches.  All other systems reviewed and are negative.    Physical Exam Triage Vital Signs ED Triage Vitals [02/13/20 0853]  Enc Vitals Group     BP (!) 136/91     Pulse Rate 81     Resp 17     Temp 98.4 F (36.9 C)     Temp Source Oral     SpO2 96 %     Weight      Height      Head Circumference      Peak Flow      Pain Score      Pain Loc      Pain Edu?      Excl. in [place]?    No data found.  Updated Vital Signs BP (!) 136/91 (BP Location: Right Arm)   Pulse 81   Temp 98.4 F (36.9 C) (Oral)   Resp 17   SpO2 96%   Visual Acuity Right Eye Distance:   Left Eye Distance:   Bilateral Distance:    Right Eye Near:   Left Eye Near:    Bilateral Near:     Physical Exam Vitals and nursing note reviewed.  Constitutional:      General: He is not in acute distress.    Appearance: He is obese.  HENT:     Head:  Normocephalic.  Eyes:     Extraocular Movements: Extraocular movements intact.  Cardiovascular:     Rate and Rhythm: Normal rate.     Heart sounds: Normal heart sounds.  Pulmonary:     Breath sounds: Normal breath sounds.  Abdominal:     General: Abdomen is protuberant. Bowel sounds are normal.     Palpations: Abdomen is soft.     Tenderness: There is abdominal tenderness in the right lower quadrant and suprapubic area. There is no right CVA tenderness or  left CVA tenderness. Negative signs include Murphy's sign.     Hernia: There is no hernia in the umbilical area, ventral area, left inguinal area, right femoral area, left femoral area or right inguinal area.       Comments: Distinct tenderness to palpation right lower quadrant pain and suprapubic area. No tenderness over symphysis pubis.  Genitourinary:    Penis: Normal.      Testes: Normal.        Right: Mass, tenderness or swelling not present.        Left: Mass, tenderness or swelling not present.  Skin:    General: Skin is warm and dry.     Findings: No rash.  Neurological:     Mental Status: He is alert.      UC Treatments / Results  Labs (all labs ordered are listed, but only abnormal results are displayed) Labs Reviewed - No data to display  EKG   Radiology No results found.  Procedures Procedures (including critical care time)  Medications Ordered in UC Medications - No data to display  Initial Impression / Assessment and Plan / UC Course  I have reviewed the triage vital signs and the nursing notes.  Pertinent labs & imaging results that were available during my care of the patient were reviewed by me and considered in my medical decision making (see chart for details).    Doubt UTI.  Not improved after 3 doses Cipro. ?diverticulitis Advised to proceed to [facility] for further evaluation.   Final Clinical Impressions(s) / UC Diagnoses   Final diagnoses:  Right lower quadrant abdominal  pain   Discharge Instructions   None    ED Prescriptions    None        Kandra Nicolas, MD 02/13/20 [phone]

## 2020-02-13 NOTE — Discharge Instructions (Signed)
Use Tylenol or ibuprofen as needed for mild to moderate pain. Use Percocet as needed for severe breakthrough pain.  Have caution, this may make you tired or groggy.  Do not drive or operate heavy machinery while taking this medicine. Your CT today showed inflammation around your intestines.  This will take time to improve, and is managed symptomatically. Follow-up with your primary care doctor if your symptoms are not improving. Return to the emergency room if you develop high fevers, persistent vomiting, severe worsening abdominal pain, blood in your stool, or any new, worsening, or concerning symptoms.

## 2020-02-13 NOTE — ED Notes (Signed)
Patient transported to CT 

## 2020-02-14 LAB — URINE CULTURE: Culture: NO GROWTH

## 2020-02-20 ENCOUNTER — Ambulatory Visit (INDEPENDENT_AMBULATORY_CARE_PROVIDER_SITE_OTHER): Payer: Medicare PPO | Admitting: Family Medicine

## 2020-02-20 ENCOUNTER — Encounter: Payer: Self-pay | Admitting: Family Medicine

## 2020-02-20 DIAGNOSIS — E782 Mixed hyperlipidemia: Secondary | ICD-10-CM | POA: Diagnosis not present

## 2020-02-20 DIAGNOSIS — I10 Essential (primary) hypertension: Secondary | ICD-10-CM | POA: Diagnosis not present

## 2020-02-20 DIAGNOSIS — K6389 Other specified diseases of intestine: Secondary | ICD-10-CM | POA: Diagnosis not present

## 2020-02-20 NOTE — Assessment & Plan Note (Signed)
Tolerating atorvastatin well, continue at current dose.  

## 2020-02-20 NOTE — Assessment & Plan Note (Signed)
Blood pressure is at goal at for age and co-morbidities.  I recommend continuation of lisinopril/hctz.  In addition they were instructed to follow a low sodium diet with regular exercise to help to maintain adequate control of blood pressure.   

## 2020-02-20 NOTE — Progress Notes (Signed)
John Morrow - 69 y.o. male MRN [id number]  Date of birth: April 21, 1951  Subjective Chief Complaint  Patient presents with  . Follow-up    HPI John Morrow is a 69 y.o. male with history of HTN and HLD here today for follow up of abdominal pain.  He was seen at urgent care on 5/30 and 5/31 for RLQ pain.  Initially told may be diverticulitis and started on Cipro.  No improvement with this and was advised to go to ED at [facility].  He had CT scan of the abdomen showing Epiploic appendagitis.   He was sent home with oxycodone.  Reports pain has pretty much resolved at this point.  He is having normal bowel movements and appetite has been normal.    BP has remained well controlled with lisinopril/hctz.  He denies chest pain, shortness of breath,  Palpitations, headache or vision changes.   ROS:  A comprehensive ROS was completed and negative except as noted per HPI  Allergies  Allergen Reactions  . Morphine And Related     Rash, vomiting, itching     Past Medical History:  Diagnosis Date  . Back pain   . Diverticulosis of colon without hemorrhage 06/12/2016   On Colonoscopy 11/06/2010  . HLD (hyperlipidemia) 05/29/2016  . Hyperlipemia   . Hypertension   . Joint pain   . Morbid obesity ([place]) 05/29/2016  . Prediabetes   . Right hip pain    needs hip replacement  . Sleep apnea     Past Surgical History:  Procedure Laterality Date  . KNEE SURGERY    . REPLACEMENT TOTAL HIP W/  RESURFACING IMPLANTS Right 81/1914   Dr. Aleda Grana    Social History   Socioeconomic History  . Marital status: Married    Spouse name: [name]  . Number of children: 4  . Years of education: 19  . Highest education level: Master's degree (e.g., MA, MS, MEng, MEd, MSW, MBA)  Occupational History  . Occupation: retired    Comment: Pharmacist, hospital  Tobacco Use  . Smoking status: Never Smoker  . Smokeless tobacco: Never Used  Substance and Sexual Activity  . Alcohol use: Not Currently  . Drug use:  No  . Sexual activity: Yes    Partners: Female  Other Topics Concern  . 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 Drinks coffee daily. Drinks a lot of water daily. Delivers groceries to elderly as needed.   Social Determinants of Health   Financial Resource Strain:   . Difficulty of Paying Living Expenses:   Food Insecurity:   . Worried About Charity fundraiser in the Last Year:   . Arboriculturist in the Last Year:   Transportation Needs:   . Film/video editor (Medical):   Marland Kitchen Lack of Transportation (Non-Medical):   Physical Activity:   . Days of Exercise per Week:   . Minutes of Exercise per Session:   Stress:   . Feeling of Stress :   Social Connections:   . Frequency of Communication with Friends and Family:   . Frequency of Social Gatherings with Friends and Family:   . Attends Religious Services:   . Active Member of Clubs or Organizations:   . Attends Archivist Meetings:   Marland Kitchen Marital Status:     Family History  Problem Relation Age of Onset  . Lung disease Mother   . Hypertension Mother   .  Lung disease Father   . Alcoholism Father     Health Maintenance  Topic Date Due  . COVID-19 Vaccine (1) Never done  . INFLUENZA VACCINE  04/15/2020  . COLONOSCOPY  11/06/2020  . TETANUS/TDAP  05/29/2026  . Hepatitis C Screening  Completed  . PNA vac Low Risk Adult  Completed     ----------------------------------------------------------------------------------------------------------------------------------------------------------------------------------------------------------------- Physical Exam BP 131/83 (BP Location: Left Arm, Patient Position: Sitting, Cuff Size: Large)   Pulse 65   Temp (!) 97.4 F (36.3 C)   Ht 5' 8.9" (1.75 m)   Wt 290 lb 6.4 oz (131.7 kg)   SpO2 98%   BMI 43.01 kg/m   Physical Exam Constitutional:      Appearance: Normal appearance.  Eyes:     General: No scleral  icterus. Cardiovascular:     Rate and Rhythm: Normal rate and regular rhythm.  Pulmonary:     Effort: Pulmonary effort is normal.     Breath sounds: Normal breath sounds.  Abdominal:     General: Abdomen is flat. There is no distension.     Palpations: Abdomen is soft.     Tenderness: There is no abdominal tenderness.  Musculoskeletal:     Cervical back: Normal range of motion and neck supple.  Skin:    General: Skin is warm.  Neurological:     General: No focal deficit present.     Mental Status: He is alert.  Psychiatric:        Mood and Affect: Mood normal.        Behavior: Behavior normal.     ------------------------------------------------------------------------------------------------------------------------------------------------------------------------------------------------------------------- Assessment and Plan  HTN (hypertension) Blood pressure is at goal at for age and co-morbidities.  I recommend continuation of lisinopril/hctz.  In addition they were instructed to follow a low sodium diet with regular exercise to help to maintain adequate control of blood pressure.    HLD (hyperlipidemia) Tolerating atorvastatin well, continue at current dose.   Epiploic appendagitis Pain has pretty much resolved at this point.  He will let us know if this re-occurs.    No orders of the defined types were placed in this encounter.   Return in about 6 months (around 08/21/2020) for HTN.    This visit occurred during the SARS-CoV-2 public health emergency.  Safety protocols were in place, including screening questions prior to the visit, additional usage of staff PPE, and extensive cleaning of exam room while observing appropriate contact time as indicated for disinfecting solutions.

## 2020-02-20 NOTE — Patient Instructions (Signed)
Epiploic Appendagitis ° °Epiploic appendagitis (EA) is swelling and irritation of pouches (epiploic appendages) that are attached to the end portion of the large intestine (colon). These pouches contain fat and are attached to the outside of the colon. This condition causes sudden pain in the lower abdomen. °EA is rare, and it usually goes away on its own. It can feel like other abdomen (abdominal) conditions, such as appendicitis, a gallbladder attack (cholecystitis), or diverticulitis. °What are the causes? °This condition may be caused by: °· Blocked blood flow due to a blood clot. °· Twisting (torsion) of the epiploic appendages. °· Conditions that cause swelling and inflammation of nearby tissue, such as long-term (chronic) diarrhea, Crohn disease, or ulcerative colitis. °What increases the risk? °You are more likely to develop this condition if: °· You are 40-50 years old. °· You are male. °· You are overweight. °What are the signs or symptoms? °The most common symptom of this condition is lower abdominal pain that starts suddenly and can be severe. Pain can be anywhere in the lower abdomen, but it is more common on the left side. The pain may get worse with movement or when pressing on your abdomen. °The following symptoms are possible, but they are not common: °· Fever. °· Nausea. °· Diarrhea or constipation. °How is this diagnosed? °Your health care provider may suspect EA if you have sudden lower abdominal pain without other symptoms. The condition may be diagnosed based on: °· CT scan. This is the best way to diagnose EA. °· Your symptoms and a physical exam. °· Ultrasound. °· A blood test (complete blood count, CBC). °· A procedure to look inside your abdomen using a lighted scope that has a tiny camera on the end (laparoscopy). This may be done to confirm the diagnosis. °How is this treated? °EA usually goes away without treatment. Your health care provider may recommend NSAIDs (such as aspirin or  ibuprofen) to reduce pain and inflammation. In rare cases, if the condition does not improve or it keeps coming back, you may need surgery to remove the appendages. °Follow these instructions at home: °· Take over-the-counter and prescription medicines only as told by your health care provider. °· Return to your normal activities as told by your health care provider. Ask your health care provider what activities are safe for you. °· Keep all follow-up visits as told by your health care provider. This is important. °Contact a health care provider if: °· You have a fever. °· You develop nausea, vomiting, diarrhea, or constipation. °· Your pain gets worse. °· Your pain lasts longer than 10 days. °Get help right away if: °· You have severe pain that does not get better after you take medicine. °Summary °· Epiploic appendagitis (EA) is swelling and irritation of pouches (epiploic appendages) that are attached to the outside of the colon. The colon is the end portion of the large intestine. °· EA can feel like other abdominal conditions, such as appendicitis, a gallbladder attack (cholecystitis), or diverticulitis. °· EA usually goes away without treatment. Your health care provider may recommend NSAIDs (such as aspirin or ibuprofen) to reduce pain and inflammation. °This information is not intended to replace advice given to you by your health care provider. Make sure you discuss any questions you have with your health care provider. °Document Revised: 12/23/2018 Document Reviewed: 03/19/2017 °Elsevier Patient Education © 2020 Elsevier Inc. ° °

## 2020-02-20 NOTE — Assessment & Plan Note (Signed)
Pain has pretty much resolved at this point.  He will let us know if this re-occurs.

## 2020-02-21 ENCOUNTER — Other Ambulatory Visit: Payer: Self-pay | Admitting: Physician Assistant

## 2020-03-12 DIAGNOSIS — L570 Actinic keratosis: Secondary | ICD-10-CM | POA: Diagnosis not present

## 2020-03-16 ENCOUNTER — Ambulatory Visit: Payer: Medicare PPO | Admitting: Family Medicine

## 2020-03-20 ENCOUNTER — Encounter: Payer: Self-pay | Admitting: Family Medicine

## 2020-03-21 ENCOUNTER — Other Ambulatory Visit: Payer: Self-pay | Admitting: Family Medicine

## 2020-03-21 DIAGNOSIS — E611 Iron deficiency: Secondary | ICD-10-CM

## 2020-03-21 NOTE — Telephone Encounter (Signed)
Does provider want to order a CBC/Diff, iron studies testing for pt?

## 2020-03-26 ENCOUNTER — Ambulatory Visit: Payer: Medicare PPO | Admitting: Family Medicine

## 2020-03-27 DIAGNOSIS — E611 Iron deficiency: Secondary | ICD-10-CM | POA: Diagnosis not present

## 2020-03-28 ENCOUNTER — Ambulatory Visit (INDEPENDENT_AMBULATORY_CARE_PROVIDER_SITE_OTHER): Payer: Medicare PPO

## 2020-03-28 ENCOUNTER — Ambulatory Visit (INDEPENDENT_AMBULATORY_CARE_PROVIDER_SITE_OTHER): Payer: Medicare PPO | Admitting: Sports Medicine

## 2020-03-28 ENCOUNTER — Encounter: Payer: Self-pay | Admitting: Sports Medicine

## 2020-03-28 ENCOUNTER — Other Ambulatory Visit: Payer: Self-pay

## 2020-03-28 DIAGNOSIS — S8992XA Unspecified injury of left lower leg, initial encounter: Secondary | ICD-10-CM | POA: Diagnosis not present

## 2020-03-28 DIAGNOSIS — M25061 Hemarthrosis, right knee: Secondary | ICD-10-CM

## 2020-03-28 DIAGNOSIS — M25562 Pain in left knee: Secondary | ICD-10-CM | POA: Diagnosis not present

## 2020-03-28 DIAGNOSIS — M25062 Hemarthrosis, left knee: Secondary | ICD-10-CM

## 2020-03-28 DIAGNOSIS — S8991XA Unspecified injury of right lower leg, initial encounter: Secondary | ICD-10-CM | POA: Diagnosis not present

## 2020-03-28 LAB — CBC
HCT: 46.1 % (ref 38.5–50.0)
Hemoglobin: 15.3 g/dL (ref 13.2–17.1)
MCH: 29.8 pg (ref 27.0–33.0)
MCHC: 33.2 g/dL (ref 32.0–36.0)
MCV: 89.9 fL (ref 80.0–100.0)
MPV: 10.8 fL (ref 7.5–12.5)
Platelets: 208 10*3/uL (ref 140–400)
RBC: 5.13 10*6/uL (ref 4.20–5.80)
RDW: 12.3 % (ref 11.0–15.0)
WBC: 5.6 10*3/uL (ref 3.8–10.8)

## 2020-03-28 LAB — IRON,TIBC AND FERRITIN PANEL
%SAT: 25 % (calc) (ref 20–48)
Ferritin: 29 ng/mL (ref 24–380)
Iron: 79 ug/dL (ref 50–180)
TIBC: 313 mcg/dL (calc) (ref 250–425)

## 2020-03-28 NOTE — Progress Notes (Signed)
    Procedures performed today:    Procedure: Real-time Ultrasound Guided  aspiration/injection of the left knee Device: Samsung HS60  Verbal informed consent obtained.  Time-out conducted.  Noted no overlying erythema, induration, or other signs of local infection.  Skin prepped in a sterile fashion.  Local anesthesia: Topical Ethyl chloride.  With sterile technique and under real time ultrasound guidance:  Using 18-gauge needle advanced into the suprapatellar recess, aspirated approximately 10 mL of blood, syringe switched and 1 cc Kenalog 40, 2 cc lidocaine, 2 cc bupivacaine injected easily Completed without difficulty  Pain immediately resolved suggesting accurate placement of the medication.  Advised to call if fevers/chills, erythema, induration, drainage, or persistent bleeding.  Images permanently stored and available for review in the ultrasound unit.  Impression: Technically successful ultrasound guided injection.  Independent interpretation of notes and tests performed by another provider:   None.  Brief History, Exam, Impression, and Recommendations:    Hemarthrosis, left knee [name] is a pleasant 69 year old male, he was walking and had a fall, had immediate pain, swelling, bruising in his left knee, on exam he has tenderness at the lateral joint line, moderate effusion. Performed an arthrocentesis, I removed a moderate hemarthrosis and then injected the knee, we will get x-rays, as well as an MRI considering hemarthrosis and degree of trauma. My concern is osteochondral defect versus ligamentous tear. Return to see me in 1 month.    ___________________________________________ Gwen Her. Dianah Field, M.D., ABFM., CAQSM. Primary Care and [place] Instructor of [place] of [facility] of [facility]

## 2020-03-28 NOTE — Assessment & Plan Note (Addendum)
John Morrow is a pleasant 69 year old male, he was walking and had a fall, had immediate pain, swelling, bruising in his left knee, on exam he has tenderness at the lateral joint line, moderate effusion. Performed an arthrocentesis, I removed a moderate hemarthrosis and then injected the knee, we will get x-rays, as well as an MRI considering hemarthrosis and degree of trauma. My concern is osteochondral defect versus ligamentous tear. Return to see me in 1 month.

## 2020-03-31 ENCOUNTER — Other Ambulatory Visit: Payer: Self-pay

## 2020-03-31 ENCOUNTER — Ambulatory Visit (INDEPENDENT_AMBULATORY_CARE_PROVIDER_SITE_OTHER): Payer: Medicare PPO

## 2020-03-31 DIAGNOSIS — M25462 Effusion, left knee: Secondary | ICD-10-CM | POA: Diagnosis not present

## 2020-03-31 DIAGNOSIS — M25062 Hemarthrosis, left knee: Secondary | ICD-10-CM

## 2020-03-31 DIAGNOSIS — M1712 Unilateral primary osteoarthritis, left knee: Secondary | ICD-10-CM | POA: Diagnosis not present

## 2020-03-31 DIAGNOSIS — S83242A Other tear of medial meniscus, current injury, left knee, initial encounter: Secondary | ICD-10-CM | POA: Diagnosis not present

## 2020-03-31 DIAGNOSIS — R600 Localized edema: Secondary | ICD-10-CM | POA: Diagnosis not present

## 2020-04-16 ENCOUNTER — Other Ambulatory Visit: Payer: Self-pay | Admitting: Family Medicine

## 2020-04-16 DIAGNOSIS — E6609 Other obesity due to excess calories: Secondary | ICD-10-CM

## 2020-04-16 DIAGNOSIS — Z6839 Body mass index (BMI) 39.0-39.9, adult: Secondary | ICD-10-CM

## 2020-04-25 ENCOUNTER — Ambulatory Visit: Payer: Medicare PPO | Admitting: Sports Medicine

## 2020-05-01 ENCOUNTER — Ambulatory Visit (INDEPENDENT_AMBULATORY_CARE_PROVIDER_SITE_OTHER): Payer: Medicare PPO | Admitting: Sports Medicine

## 2020-05-01 DIAGNOSIS — B079 Viral wart, unspecified: Secondary | ICD-10-CM | POA: Insufficient documentation

## 2020-05-01 DIAGNOSIS — B078 Other viral warts: Secondary | ICD-10-CM

## 2020-05-01 DIAGNOSIS — M1712 Unilateral primary osteoarthritis, left knee: Secondary | ICD-10-CM | POA: Diagnosis not present

## 2020-05-01 NOTE — Assessment & Plan Note (Signed)
Mom also noted a wart on his right fifth finger, cryotherapy performed today, follow this up with PCP.

## 2020-05-01 NOTE — Progress Notes (Signed)
    Procedures performed today:    Procedure:  Cryodestruction of right fifth finger verruca Consent obtained and verified. Time-out conducted. Noted no overlying erythema, induration, or other signs of local infection. Completed without difficulty using Cryo-Gun. Advised to call if fevers/chills, erythema, induration, drainage, or persistent bleeding.  Independent interpretation of notes and tests performed by another provider:   None.  Brief History, Exam, Impression, and Recommendations:    Primary osteoarthritis of left knee This is a very pleasant 69 year old male, at the last visit we aspirated and injected a hemarthrosis, ultimately obtained an MRI that showed severe osteoarthritis and expected meniscal tearing, he returns doing extremely well, completely pain-free, no mechanical symptoms, I explained to him the natural history of osteoarthritis, we can certainly do another injection with 3 months as the cut off.  Verruca Mom also noted a wart on his right fifth finger, cryotherapy performed today, follow this up with PCP.    ___________________________________________ Gwen Her. Dianah Field, M.D., ABFM., CAQSM. Primary Care and [place] Instructor of [place] of [employer]

## 2020-05-01 NOTE — Assessment & Plan Note (Signed)
This is a very pleasant 69 year old male, at the last visit we aspirated and injected a hemarthrosis, ultimately obtained an MRI that showed severe osteoarthritis and expected meniscal tearing, he returns doing extremely well, completely pain-free, no mechanical symptoms, I explained to him the natural history of osteoarthritis, we can certainly do another injection with 3 months as the cut off.

## 2020-06-12 DIAGNOSIS — L853 Xerosis cutis: Secondary | ICD-10-CM | POA: Diagnosis not present

## 2020-06-12 DIAGNOSIS — R233 Spontaneous ecchymoses: Secondary | ICD-10-CM | POA: Diagnosis not present

## 2020-06-12 DIAGNOSIS — L570 Actinic keratosis: Secondary | ICD-10-CM | POA: Diagnosis not present

## 2020-07-03 ENCOUNTER — Ambulatory Visit: Payer: Medicare PPO | Admitting: Sports Medicine

## 2020-07-03 ENCOUNTER — Other Ambulatory Visit: Payer: Self-pay

## 2020-07-03 DIAGNOSIS — M1712 Unilateral primary osteoarthritis, left knee: Secondary | ICD-10-CM | POA: Diagnosis not present

## 2020-07-03 MED ORDER — TRAMADOL HCL 50 MG PO TABS: 50.0000 mg | ORAL_TABLET | Freq: Three times a day (TID) | ORAL | 0 refills | Status: DC | PRN

## 2020-07-03 NOTE — Assessment & Plan Note (Signed)
John Morrow returns, he is a very pleasant 69 year old male, he has bilateral left worse than right severe osteoarthritis with meniscal tearing. We aspirated a hemarthrosis back in July, he did well for a couple of months and then had a recurrence of pain. He does have some moving to do with his son, so we will hold off on additional intervention for now, I am going to give him some tramadol to hold his pain over in the meantime. We discussed possible options including steroid injection, viscosupplementation, genicular RFA, and arthroplasty. I think he is leaning towards arthroplasty, I would like him to have a consultation with Dr. Berenice Primas, he is also considering doing both at the same time.

## 2020-07-03 NOTE — Progress Notes (Signed)
    Procedures performed today:    None.  Independent interpretation of notes and tests performed by another provider:   None.  Brief History, Exam, Impression, and Recommendations:    Primary osteoarthritis of left knee John Morrow returns, he is a very pleasant 70 year old male, he has bilateral left worse than right severe osteoarthritis with meniscal tearing. We aspirated a hemarthrosis back in July, he did well for a couple of months and then had a recurrence of pain. He does have some moving to do with his son, so we will hold off on additional intervention for now, I am going to give him some tramadol to hold his pain over in the meantime. We discussed possible options including steroid injection, viscosupplementation, genicular RFA, and arthroplasty. I think he is leaning towards arthroplasty, I would like him to have a consultation with Dr. Berenice Primas, he is also considering doing both at the same time.    ___________________________________________ Gwen Her. Dianah Field, M.D., ABFM., CAQSM. Primary Care and [place] Instructor of [place] of [facility] of Medicine

## 2020-07-10 ENCOUNTER — Other Ambulatory Visit: Payer: Self-pay | Admitting: Family Medicine

## 2020-07-12 DIAGNOSIS — M170 Bilateral primary osteoarthritis of knee: Secondary | ICD-10-CM | POA: Diagnosis not present

## 2020-07-16 NOTE — Telephone Encounter (Signed)
Pt no longer under Dr. Clovis Riley care. Sending rx request to PCP Dr. Zigmund Daniel.

## 2020-07-18 ENCOUNTER — Ambulatory Visit (INDEPENDENT_AMBULATORY_CARE_PROVIDER_SITE_OTHER): Payer: Medicare PPO | Admitting: Family Medicine

## 2020-07-18 ENCOUNTER — Encounter: Payer: Self-pay | Admitting: Family Medicine

## 2020-07-18 ENCOUNTER — Other Ambulatory Visit: Payer: Self-pay

## 2020-07-18 VITALS — BP 120/77 | HR 80 | Ht 68.9 in | Wt 288.0 lb

## 2020-07-18 DIAGNOSIS — Z01818 Encounter for other preprocedural examination: Secondary | ICD-10-CM | POA: Diagnosis not present

## 2020-07-18 NOTE — Assessment & Plan Note (Signed)
He is fairly low risk for cardiac complications related to orthopedic surgery.  Chronic conditions of HTN and HLD are well managed with current medications.  He has no anginal symptoms or shortness of breath.  Will update labs today.  Revised cardiac risk index: Class I with ~3.9% risk of MI or death 30 days post operatively.

## 2020-07-18 NOTE — Progress Notes (Signed)
John Morrow - 69 y.o. male MRN [id number]  Date of birth: [birth year]  Subjective No chief complaint on file.   HPI John Morrow is a 69 y.o. male here today for pre-op visit.  He is planning on L total knee arthroplasty next month.  This will be done with spinal anesthesia.  He has history of HTN that has been well controlled with lisinopril/hctz and HLD that has been well managed with atorvastatin.  He is not on any blood thinners or antiplatelet medications.  He has no history of heart disease or stroke.  He has not had chest pain or progressive dypsnea.    ROS:  A comprehensive ROS was completed and negative except as noted per HPI  Allergies  Allergen Reactions  . Morphine And Related     Rash, vomiting, itching     Past Medical History:  Diagnosis Date  . Back pain   . Diverticulosis of colon without hemorrhage 06/12/2016   On Colonoscopy 11/06/2010  . HLD (hyperlipidemia) 05/29/2016  . Hyperlipemia   . Hypertension   . Joint pain   . Morbid obesity ([place]) 05/29/2016  . Prediabetes   . Right hip pain    needs hip replacement  . Sleep apnea     Past Surgical History:  Procedure Laterality Date  . KNEE SURGERY    . REPLACEMENT TOTAL HIP W/  RESURFACING IMPLANTS Right 25/0539   Dr. Aleda Grana    Social History   Socioeconomic History  . Marital status: Married    Spouse name: [name]  . Number of children: 4  . Years of education: 68  . Highest education level: Master's degree (e.g., MA, MS, MEng, MEd, MSW, MBA)  Occupational History  . Occupation: retired    Comment: Pharmacist, hospital  Tobacco Use  . Smoking status: Never Smoker  . Smokeless tobacco: Never Used  Vaping Use  . Vaping Use: Never used  Substance and Sexual Activity  . Alcohol use: Not Currently  . Drug use: No  . Sexual activity: Yes    Partners: Female  Other Topics Concern  . 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 Drinks coffee daily. Drinks a  lot of water daily. Delivers groceries to elderly as needed.   Social Determinants of Health   Financial Resource Strain:   . Difficulty of Paying Living Expenses: Not on file  Food Insecurity:   . Worried About Charity fundraiser in the Last Year: Not on file  . Ran Out of Food in the Last Year: Not on file  Transportation Needs:   . Lack of Transportation (Medical): Not on file  . Lack of Transportation (Non-Medical): Not on file  Physical Activity:   . Days of Exercise per Week: Not on file  . Minutes of Exercise per Session: Not on file  Stress:   . Feeling of Stress : Not on file  Social Connections:   . Frequency of Communication with Friends and Family: Not on file  . Frequency of Social Gatherings with Friends and Family: Not on file  . Attends Religious Services: Not on file  . Active Member of Clubs or Organizations: Not on file  . Attends Archivist Meetings: Not on file  . Marital Status: Not on file    Family History  Problem Relation Age of Onset  . Lung disease Mother   . Hypertension Mother   . Lung disease Father   . Alcoholism Father  Health Maintenance  Topic Date Due  . COLONOSCOPY  11/06/2020  . TETANUS/TDAP  05/29/2026  . INFLUENZA VACCINE  Completed  . COVID-19 Vaccine  Completed  . Hepatitis C Screening  Completed  . PNA vac Low Risk Adult  Completed     ----------------------------------------------------------------------------------------------------------------------------------------------------------------------------------------------------------------- Physical Exam BP 120/77   Pulse 80   Ht 5' 8.9" (1.75 m)   Wt 288 lb (130.6 kg)   BMI 42.65 kg/m   Physical Exam Constitutional:      Appearance: Normal appearance.  HENT:     Head: Normocephalic and atraumatic.  Eyes:     General: No scleral icterus. Cardiovascular:     Rate and Rhythm: Normal rate and regular rhythm.     Pulses: Normal pulses.  Pulmonary:      Effort: Pulmonary effort is normal.     Breath sounds: Normal breath sounds.  Abdominal:     General: Abdomen is flat. There is no distension.     Palpations: Abdomen is soft.     Tenderness: There is no abdominal tenderness.  Skin:    General: Skin is warm and dry.     Findings: No rash.  Neurological:     General: No focal deficit present.     Mental Status: He is alert.  Psychiatric:        Mood and Affect: Mood normal.        Behavior: Behavior normal.     ------------------------------------------------------------------------------------------------------------------------------------------------------------------------------------------------------------------- Assessment and Plan  Pre-operative exam He is fairly low risk for cardiac complications related to orthopedic surgery.  Chronic conditions of HTN and HLD are well managed with current medications.  He has no anginal symptoms or shortness of breath.  Will update labs today.  Revised cardiac risk index: Class I with ~3.9% risk of MI or death 30 days post operatively.     No orders of the defined types were placed in this encounter.   No follow-ups on file.  30 minutes spent including pre visit preparation, review of prior notes and labs, encounter with patient and same day documentation.   This visit occurred during the SARS-CoV-2 public health emergency.  Safety protocols were in place, including screening questions prior to the visit, additional usage of staff PPE, and extensive cleaning of exam room while observing appropriate contact time as indicated for disinfecting solutions.

## 2020-07-19 DIAGNOSIS — Z01818 Encounter for other preprocedural examination: Secondary | ICD-10-CM | POA: Diagnosis not present

## 2020-07-20 LAB — CBC
HCT: 43.2 % (ref 38.5–50.0)
Hemoglobin: 15 g/dL (ref 13.2–17.1)
MCH: 31.5 pg (ref 27.0–33.0)
MCHC: 34.7 g/dL (ref 32.0–36.0)
MCV: 90.8 fL (ref 80.0–100.0)
MPV: 10.4 fL (ref 7.5–12.5)
Platelets: 203 10*3/uL (ref 140–400)
RBC: 4.76 10*6/uL (ref 4.20–5.80)
RDW: 11.4 % (ref 11.0–15.0)
WBC: 4.9 10*3/uL (ref 3.8–10.8)

## 2020-07-20 LAB — COMPLETE METABOLIC PANEL WITH GFR
AG Ratio: 1.9 (calc) (ref 1.0–2.5)
ALT: 17 U/L (ref 9–46)
AST: 16 U/L (ref 10–35)
Albumin: 4 g/dL (ref 3.6–5.1)
Alkaline phosphatase (APISO): 55 U/L (ref 35–144)
BUN: 22 mg/dL (ref 7–25)
CO2: 28 mmol/L (ref 20–32)
Calcium: 9.3 mg/dL (ref 8.6–10.3)
Chloride: 104 mmol/L (ref 98–110)
Creat: 1.15 mg/dL (ref 0.70–1.18)
GFR, Est African American: 74 mL/min/{1.73_m2} (ref >=60)
GFR, Est Non African American: 64 mL/min/{1.73_m2} (ref >=60)
Globulin: 2.1 g/dL (calc) (ref 1.9–3.7)
Glucose, Bld: 99 mg/dL (ref 65–99)
Potassium: 3.8 mmol/L (ref 3.5–5.3)
Sodium: 141 mmol/L (ref 135–146)
Total Bilirubin: 0.5 mg/dL (ref 0.2–1.2)
Total Protein: 6.1 g/dL (ref 6.1–8.1)

## 2020-07-23 ENCOUNTER — Other Ambulatory Visit: Payer: Self-pay | Admitting: Family Medicine

## 2020-07-23 DIAGNOSIS — E6609 Other obesity due to excess calories: Secondary | ICD-10-CM

## 2020-07-23 DIAGNOSIS — Z6839 Body mass index (BMI) 39.0-39.9, adult: Secondary | ICD-10-CM

## 2020-07-31 ENCOUNTER — Other Ambulatory Visit: Payer: Self-pay | Admitting: Family Medicine

## 2020-08-07 DIAGNOSIS — M25562 Pain in left knee: Secondary | ICD-10-CM | POA: Diagnosis not present

## 2020-08-07 DIAGNOSIS — M1712 Unilateral primary osteoarthritis, left knee: Secondary | ICD-10-CM | POA: Diagnosis not present

## 2020-08-08 ENCOUNTER — Other Ambulatory Visit: Payer: Self-pay | Admitting: Sports Medicine

## 2020-08-08 DIAGNOSIS — M1712 Unilateral primary osteoarthritis, left knee: Secondary | ICD-10-CM

## 2020-08-11 ENCOUNTER — Other Ambulatory Visit: Payer: Self-pay | Admitting: Family Medicine

## 2020-08-13 ENCOUNTER — Telehealth: Payer: Self-pay

## 2020-08-13 NOTE — Telephone Encounter (Signed)
Calling for CPAP compliance clearance prior to surgery.  Per Dr. Zigmund Daniel we are going off the patient's word as well. Pt is stating he is compliant.

## 2020-08-21 ENCOUNTER — Other Ambulatory Visit: Payer: Self-pay

## 2020-08-21 ENCOUNTER — Encounter: Payer: Self-pay | Admitting: Family Medicine

## 2020-08-21 ENCOUNTER — Ambulatory Visit (INDEPENDENT_AMBULATORY_CARE_PROVIDER_SITE_OTHER): Payer: Medicare PPO | Admitting: Family Medicine

## 2020-08-21 DIAGNOSIS — E6609 Other obesity due to excess calories: Secondary | ICD-10-CM

## 2020-08-21 DIAGNOSIS — N400 Enlarged prostate without lower urinary tract symptoms: Secondary | ICD-10-CM | POA: Diagnosis not present

## 2020-08-21 DIAGNOSIS — I10 Essential (primary) hypertension: Secondary | ICD-10-CM

## 2020-08-21 DIAGNOSIS — E782 Mixed hyperlipidemia: Secondary | ICD-10-CM

## 2020-08-21 DIAGNOSIS — M1712 Unilateral primary osteoarthritis, left knee: Secondary | ICD-10-CM | POA: Diagnosis not present

## 2020-08-21 DIAGNOSIS — Z6839 Body mass index (BMI) 39.0-39.9, adult: Secondary | ICD-10-CM

## 2020-08-21 MED ORDER — BUPROPION HCL ER (XL) 150 MG PO TB24: 150.0000 mg | ORAL_TABLET | Freq: Every day | ORAL | 3 refills | Status: DC

## 2020-08-21 MED ORDER — TAMSULOSIN HCL 0.4 MG PO CAPS: 0.4000 mg | ORAL_CAPSULE | Freq: Every day | ORAL | 3 refills | Status: DC

## 2020-08-21 NOTE — Assessment & Plan Note (Signed)
He has upcoming knee arthroplasty.

## 2020-08-21 NOTE — Assessment & Plan Note (Signed)
LUT's are well managed with tamsulosin.  Continue current dose and update PSA.

## 2020-08-21 NOTE — Patient Instructions (Signed)
Great to see you today! I hope your surgery goes well.  Have labs completed around January/February.  See me again in 6 months.

## 2020-08-21 NOTE — Progress Notes (Signed)
John Morrow - 69 y.o. male MRN [id number]  Date of birth: December 30, 1950  Subjective No chief complaint on file.   HPI John Morrow is a 69 y.o. male with history of HTN, HLD, BPH and OA here today for follow up visit.   He was recently seen for a pre-op exam for upcoming total knee arthroplasty. Unfortunately his surgery has been postponed until his weight is under a little better control.  Recent CMP and CBC normal.    His BP has been managed with lisinopril/hctz 20/25mg .  He is doing well with this without any significant side effects.  He has not had symptoms related to HTN including chest pain, shortness of breath, palpitations, headache or vision changes.   Continues to tolerate lipitor well.  He denies myalgias with this.    LUTs related to BPH have remained well controlled with flomax.   Lab Results  Component Value Date   PSA 1.2 10/06/2019   PSA 1.3 03/17/2018   PSA 1.2 05/05/2017   ROS:  A comprehensive ROS was completed and negative except as noted per HPI    Allergies  Allergen Reactions  . Morphine And Related     Rash, vomiting, itching     Past Medical History:  Diagnosis Date  . Back pain   . Diverticulosis of colon without hemorrhage 06/12/2016   On Colonoscopy 11/06/2010  . HLD (hyperlipidemia) 05/29/2016  . Hyperlipemia   . Hypertension   . Joint pain   . Morbid obesity ([place]) 05/29/2016  . Prediabetes   . Right hip pain    needs hip replacement  . Sleep apnea     Past Surgical History:  Procedure Laterality Date  . KNEE SURGERY    . REPLACEMENT TOTAL HIP W/  RESURFACING IMPLANTS Right 68/3419   Dr. Aleda Grana    Social History   Socioeconomic History  . Marital status: Married    Spouse name: John Morrow  . Number of children: 4  . Years of education: 49  . Highest education level: Master's degree (e.g., MA, MS, MEng, MEd, MSW, MBA)  Occupational History  . Occupation: retired    Comment: Pharmacist, hospital  Tobacco Use  . Smoking status: Never Smoker  .  Smokeless tobacco: Never Used  Vaping Use  . Vaping Use: Never used  Substance and Sexual Activity  . Alcohol use: Not Currently  . Drug use: No  . Sexual activity: Yes    Partners: Female  Other Topics Concern  . 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 Drinks coffee daily. Drinks a lot of water daily. Delivers groceries to elderly as needed.   Social Determinants of Health   Financial Resource Strain:   . Difficulty of Paying Living Expenses: Not on file  Food Insecurity:   . Worried About Charity fundraiser in the Last Year: Not on file  . Ran Out of Food in the Last Year: Not on file  Transportation Needs:   . Lack of Transportation (Medical): Not on file  . Lack of Transportation (Non-Medical): Not on file  Physical Activity:   . Days of Exercise per Week: Not on file  . Minutes of Exercise per Session: Not on file  Stress:   . Feeling of Stress : Not on file  Social Connections:   . Frequency of Communication with Friends and Family: Not on file  . Frequency of Social Gatherings with Friends and Family: Not on file  .  Attends Religious Services: Not on file  . Active Member of Clubs or Organizations: Not on file  . Attends Archivist Meetings: Not on file  . Marital Status: Not on file    Family History  Problem Relation Age of Onset  . Lung disease Mother   . Hypertension Mother   . Lung disease Father   . Alcoholism Father     Health Maintenance  Topic Date Due  . COLONOSCOPY  11/06/2020  . TETANUS/TDAP  05/29/2026  . INFLUENZA VACCINE  Completed  . COVID-19 Vaccine  Completed  . Hepatitis C Screening  Completed  . PNA vac Low Risk Adult  Completed     ----------------------------------------------------------------------------------------------------------------------------------------------------------------------------------------------------------------- Physical Exam BP 138/84    Pulse 73   Ht 5' 8.9" (1.75 m)   Wt 287 lb (130.2 kg)   BMI 42.51 kg/m   Physical Exam Constitutional:      Appearance: Normal appearance.  HENT:     Head: Normocephalic and atraumatic.  Eyes:     General: No scleral icterus. Cardiovascular:     Rate and Rhythm: Normal rate and regular rhythm.  Pulmonary:     Effort: Pulmonary effort is normal.     Breath sounds: Normal breath sounds.  Musculoskeletal:     Cervical back: Neck supple.  Neurological:     General: No focal deficit present.     Mental Status: He is alert.  Psychiatric:        Mood and Affect: Mood normal.        Behavior: Behavior normal.     ------------------------------------------------------------------------------------------------------------------------------------------------------------------------------------------------------------------- Assessment and Plan  HLD (hyperlipidemia) He continues to tolerate atorvastatin well.  Continue at current strength and update lipid panel.    Primary osteoarthritis of left knee He has upcoming knee arthroplasty.    BPH (benign prostatic hyperplasia) LUT's are well managed with tamsulosin.  Continue current dose and update PSA.    HTN (hypertension) Blood pressure is at goal at for age and co-morbidities.  I recommend continuation of lisinopril/hctz at current strength.  In addition they were instructed to follow a low sodium diet with regular exercise to help to maintain adequate control of blood pressure.     Meds ordered this encounter  Medications  . tamsulosin (FLOMAX) 0.4 MG CAPS capsule    Sig: Take 1 capsule (0.4 mg total) by mouth daily.    Dispense:  90 capsule    Refill:  3  . buPROPion (WELLBUTRIN XL) 150 MG 24 hr tablet    Sig: Take 1 tablet (150 mg total) by mouth daily.    Dispense:  90 tablet    Refill:  3   Orders Placed This Encounter  Procedures  . PSA  . Lipid Profile     No follow-ups on file.    This visit occurred  during the SARS-CoV-2 public health emergency.  Safety protocols were in place, including screening questions prior to the visit, additional usage of staff PPE, and extensive cleaning of exam room while observing appropriate contact time as indicated for disinfecting solutions.

## 2020-08-21 NOTE — Assessment & Plan Note (Signed)
Blood pressure is at goal at for age and co-morbidities.  I recommend continuation of lisinopril/hctz at current strength.  In addition they were instructed to follow a low sodium diet with regular exercise to help to maintain adequate control of blood pressure.

## 2020-08-21 NOTE — Assessment & Plan Note (Signed)
He continues to tolerate atorvastatin well.  Continue at current strength and update lipid panel.

## 2020-08-23 DIAGNOSIS — G4733 Obstructive sleep apnea (adult) (pediatric): Secondary | ICD-10-CM | POA: Diagnosis not present

## 2020-08-29 ENCOUNTER — Ambulatory Visit: Payer: Medicare PPO | Admitting: Physical Therapy

## 2020-08-31 DIAGNOSIS — E782 Mixed hyperlipidemia: Secondary | ICD-10-CM | POA: Diagnosis not present

## 2020-08-31 DIAGNOSIS — N400 Enlarged prostate without lower urinary tract symptoms: Secondary | ICD-10-CM | POA: Diagnosis not present

## 2020-09-01 LAB — LIPID PANEL
Cholesterol: 122 mg/dL (ref <200)
HDL: 39 mg/dL — ABNORMAL LOW (ref >=40)
LDL Cholesterol (Calc): 66 mg/dL (calc)
Non-HDL Cholesterol (Calc): 83 mg/dL (calc) (ref <130)
Total CHOL/HDL Ratio: 3.1 (calc) (ref <5.0)
Triglycerides: 89 mg/dL (ref <150)

## 2020-09-01 LAB — PSA: PSA: 1.69 ng/mL (ref <=4.0)

## 2020-09-10 ENCOUNTER — Ambulatory Visit: Payer: Self-pay

## 2020-09-11 DIAGNOSIS — L570 Actinic keratosis: Secondary | ICD-10-CM | POA: Diagnosis not present

## 2020-09-13 ENCOUNTER — Encounter: Payer: Self-pay | Admitting: Family Medicine

## 2020-09-13 DIAGNOSIS — R059 Cough, unspecified: Secondary | ICD-10-CM | POA: Diagnosis not present

## 2020-09-13 DIAGNOSIS — R5383 Other fatigue: Secondary | ICD-10-CM | POA: Diagnosis not present

## 2020-09-13 DIAGNOSIS — Z20822 Contact with and (suspected) exposure to covid-19: Secondary | ICD-10-CM | POA: Diagnosis not present

## 2020-09-13 DIAGNOSIS — J029 Acute pharyngitis, unspecified: Secondary | ICD-10-CM | POA: Diagnosis not present

## 2020-09-13 DIAGNOSIS — R509 Fever, unspecified: Secondary | ICD-10-CM | POA: Diagnosis not present

## 2020-09-13 DIAGNOSIS — Z03818 Encounter for observation for suspected exposure to other biological agents ruled out: Secondary | ICD-10-CM | POA: Diagnosis not present

## 2020-09-17 ENCOUNTER — Telehealth: Payer: Medicare PPO | Admitting: Nurse Practitioner

## 2020-10-09 ENCOUNTER — Other Ambulatory Visit: Payer: Self-pay

## 2020-10-09 ENCOUNTER — Ambulatory Visit: Payer: Medicare PPO | Admitting: Sports Medicine

## 2020-10-09 ENCOUNTER — Ambulatory Visit (INDEPENDENT_AMBULATORY_CARE_PROVIDER_SITE_OTHER): Payer: Medicare PPO

## 2020-10-09 DIAGNOSIS — M545 Low back pain, unspecified: Secondary | ICD-10-CM

## 2020-10-09 DIAGNOSIS — M5136 Other intervertebral disc degeneration, lumbar region: Secondary | ICD-10-CM

## 2020-10-09 DIAGNOSIS — M47816 Spondylosis without myelopathy or radiculopathy, lumbar region: Secondary | ICD-10-CM | POA: Insufficient documentation

## 2020-10-09 MED ORDER — PREDNISONE 50 MG PO TABS: ORAL_TABLET | ORAL | 0 refills | Status: DC

## 2020-10-09 MED ORDER — GABAPENTIN 300 MG PO CAPS: ORAL_CAPSULE | ORAL | 3 refills | Status: DC

## 2020-10-09 NOTE — Progress Notes (Signed)
    Procedures performed today:    None.  Independent interpretation of notes and tests performed by another provider:   CT scan from 7 months ago reviewed, he has multilevel lumbar DDD worst at L4-L5 with significant left L4-L5 facet arthritis as well.  Brief History, Exam, Impression, and Recommendations:    Low back pain This is a pleasant 70 year old male, he has recently started having low back pain, left-sided, worse with most movements, nothing overtly radicular, no progressive weakness, he does have known significant lumbar spondylosis from a CT scan approximately 7 months ago. He tells me symptoms felt similar to an episode of prostatitis he had in the past. For this reason we are going to check a urinalysis (unable to void today, he will drop a urine specimen off at the lab over the next few days). Adding 5 days of prednisone, gabapentin, updated x-rays, home physical therapy. Return to see me in 4 to 6 weeks, MRI for interventional planning if no better.    ___________________________________________ Gwen Her. Dianah Field, M.D., ABFM., CAQSM. Primary Care and [place] Instructor of [place] of [facility] of Medicine

## 2020-10-09 NOTE — Assessment & Plan Note (Addendum)
This is a pleasant 70 year old male, he has recently started having low back pain, left-sided, worse with most movements, nothing overtly radicular, no progressive weakness, he does have known significant lumbar spondylosis from a CT scan approximately 7 months ago. He tells me symptoms felt similar to an episode of prostatitis he had in the past. For this reason we are going to check a urinalysis (unable to void today, he will drop a urine specimen off at the lab over the next few days). Adding 5 days of prednisone, gabapentin, updated x-rays, home physical therapy. Return to see me in 4 to 6 weeks, MRI for interventional planning if no better.

## 2020-10-10 LAB — NO CULTURE INDICATED

## 2020-10-10 LAB — URINALYSIS W MICROSCOPIC + REFLEX CULTURE
Bacteria, UA: NONE SEEN /HPF
Bilirubin Urine: NEGATIVE
Glucose, UA: NEGATIVE
Hgb urine dipstick: NEGATIVE
Hyaline Cast: NONE SEEN /LPF
Ketones, ur: NEGATIVE
Leukocyte Esterase: NEGATIVE
Nitrites, Initial: NEGATIVE
Protein, ur: NEGATIVE
RBC / HPF: NONE SEEN /HPF (ref 0–2)
Specific Gravity, Urine: 1.013 (ref 1.001–1.03)
Squamous Epithelial / HPF: NONE SEEN /HPF (ref <=5)
WBC, UA: NONE SEEN /HPF (ref 0–5)
pH: 5.5 (ref 5.0–8.0)

## 2020-10-22 ENCOUNTER — Other Ambulatory Visit: Payer: Self-pay | Admitting: Family Medicine

## 2020-10-22 DIAGNOSIS — Z6839 Body mass index (BMI) 39.0-39.9, adult: Secondary | ICD-10-CM

## 2020-10-22 DIAGNOSIS — E6609 Other obesity due to excess calories: Secondary | ICD-10-CM

## 2020-11-05 ENCOUNTER — Other Ambulatory Visit: Payer: Self-pay | Admitting: Family Medicine

## 2020-11-14 ENCOUNTER — Ambulatory Visit: Payer: Medicare PPO | Admitting: Sports Medicine

## 2020-11-14 ENCOUNTER — Other Ambulatory Visit: Payer: Self-pay

## 2020-11-14 DIAGNOSIS — M47816 Spondylosis without myelopathy or radiculopathy, lumbar region: Secondary | ICD-10-CM | POA: Diagnosis not present

## 2020-11-14 MED ORDER — MELOXICAM 15 MG PO TABS: ORAL_TABLET | ORAL | 3 refills | Status: DC

## 2020-11-14 NOTE — Assessment & Plan Note (Signed)
John Morrow returns, he is a pleasant 70 year old male, low back pain, left-sided and worse with most movements, nothing radicular. He did have L4-L5 central stenosis, and L4-L5 bilateral facet arthritis on a CT scan from about 8 months ago. No evidence of prostatitis, urinalysis at the last visit was negative. We did prednisone, gabapentin, and he has improved considerably though still has some tightness. I have advised that he try a pescatarian type diet with omega-3's, as this may improve his achiness. We will also add meloxicam to use as needed, he can return to see me on an as-needed basis and will incorporate his rehabilitation exercises into the rest of his life.

## 2020-11-14 NOTE — Progress Notes (Signed)
    Procedures performed today:    None.  Independent interpretation of notes and tests performed by another provider:   None.  Brief History, Exam, Impression, and Recommendations:    Lumbar spondylosis John Morrow returns, he is a pleasant 70 year old male, low back pain, left-sided and worse with most movements, nothing radicular. He did have L4-L5 central stenosis, and L4-L5 bilateral facet arthritis on a CT scan from about 8 months ago. No evidence of prostatitis, urinalysis at the last visit was negative. We did prednisone, gabapentin, and he has improved considerably though still has some tightness. I have advised that he try a pescatarian type diet with omega-3's, as this may improve his achiness. We will also add meloxicam to use as needed, he can return to see me on an as-needed basis and will incorporate his rehabilitation exercises into the rest of his life.    ___________________________________________ Gwen Her. Dianah Field, M.D., ABFM., CAQSM. Primary Care and [place] Instructor of [place] of [facility] of Medicine

## 2021-01-09 DIAGNOSIS — H524 Presbyopia: Secondary | ICD-10-CM | POA: Diagnosis not present

## 2021-02-03 ENCOUNTER — Encounter: Payer: Self-pay | Admitting: Family Medicine

## 2021-02-03 ENCOUNTER — Other Ambulatory Visit: Payer: Self-pay | Admitting: Family Medicine

## 2021-02-03 DIAGNOSIS — E6609 Other obesity due to excess calories: Secondary | ICD-10-CM

## 2021-02-05 ENCOUNTER — Other Ambulatory Visit: Payer: Self-pay | Admitting: Family Medicine

## 2021-02-05 DIAGNOSIS — I10 Essential (primary) hypertension: Secondary | ICD-10-CM

## 2021-02-05 NOTE — Telephone Encounter (Signed)
CMP and CBC entered.

## 2021-02-11 ENCOUNTER — Other Ambulatory Visit: Payer: Self-pay | Admitting: Family Medicine

## 2021-02-13 ENCOUNTER — Other Ambulatory Visit: Payer: Self-pay

## 2021-02-13 DIAGNOSIS — N529 Male erectile dysfunction, unspecified: Secondary | ICD-10-CM | POA: Insufficient documentation

## 2021-02-14 DIAGNOSIS — I10 Essential (primary) hypertension: Secondary | ICD-10-CM | POA: Diagnosis not present

## 2021-02-15 LAB — COMPLETE METABOLIC PANEL WITH GFR
AG Ratio: 1.9 (calc) (ref 1.0–2.5)
ALT: 21 U/L (ref 9–46)
AST: 18 U/L (ref 10–35)
Albumin: 4.1 g/dL (ref 3.6–5.1)
Alkaline phosphatase (APISO): 57 U/L (ref 35–144)
BUN: 21 mg/dL (ref 7–25)
CO2: 29 mmol/L (ref 20–32)
Calcium: 9.3 mg/dL (ref 8.6–10.3)
Chloride: 104 mmol/L (ref 98–110)
Creat: 1.18 mg/dL (ref 0.70–1.18)
GFR, Est African American: 72 mL/min/{1.73_m2} (ref >=60)
GFR, Est Non African American: 62 mL/min/{1.73_m2} (ref >=60)
Globulin: 2.2 g/dL (calc) (ref 1.9–3.7)
Glucose, Bld: 89 mg/dL (ref 65–99)
Potassium: 3.9 mmol/L (ref 3.5–5.3)
Sodium: 141 mmol/L (ref 135–146)
Total Bilirubin: 1 mg/dL (ref 0.2–1.2)
Total Protein: 6.3 g/dL (ref 6.1–8.1)

## 2021-02-15 LAB — CBC WITH DIFFERENTIAL/PLATELET
Absolute Monocytes: 608 cells/uL (ref 200–950)
Basophils Absolute: 29 cells/uL (ref 0–200)
Basophils Relative: 0.6 %
Eosinophils Absolute: 88 cells/uL (ref 15–500)
Eosinophils Relative: 1.8 %
HCT: 46.3 % (ref 38.5–50.0)
Hemoglobin: 15 g/dL (ref 13.2–17.1)
Lymphs Abs: 1259 cells/uL (ref 850–3900)
MCH: 28.8 pg (ref 27.0–33.0)
MCHC: 32.4 g/dL (ref 32.0–36.0)
MCV: 89 fL (ref 80.0–100.0)
MPV: 10.4 fL (ref 7.5–12.5)
Monocytes Relative: 12.4 %
Neutro Abs: 2916 cells/uL (ref 1500–7800)
Neutrophils Relative %: 59.5 %
Platelets: 176 10*3/uL (ref 140–400)
RBC: 5.2 10*6/uL (ref 4.20–5.80)
RDW: 12 % (ref 11.0–15.0)
Total Lymphocyte: 25.7 %
WBC: 4.9 10*3/uL (ref 3.8–10.8)

## 2021-02-19 ENCOUNTER — Other Ambulatory Visit: Payer: Self-pay

## 2021-02-19 ENCOUNTER — Encounter: Payer: Self-pay | Admitting: Family Medicine

## 2021-02-19 ENCOUNTER — Ambulatory Visit: Payer: Medicare PPO | Admitting: Family Medicine

## 2021-02-19 VITALS — BP 132/76 | HR 66 | Temp 97.6°F | Ht 69.0 in | Wt 286.0 lb

## 2021-02-19 DIAGNOSIS — Z1211 Encounter for screening for malignant neoplasm of colon: Secondary | ICD-10-CM

## 2021-02-19 DIAGNOSIS — E6609 Other obesity due to excess calories: Secondary | ICD-10-CM

## 2021-02-19 DIAGNOSIS — N400 Enlarged prostate without lower urinary tract symptoms: Secondary | ICD-10-CM | POA: Diagnosis not present

## 2021-02-19 DIAGNOSIS — E66812 Obesity, class 2: Secondary | ICD-10-CM

## 2021-02-19 DIAGNOSIS — E782 Mixed hyperlipidemia: Secondary | ICD-10-CM

## 2021-02-19 DIAGNOSIS — M1712 Unilateral primary osteoarthritis, left knee: Secondary | ICD-10-CM

## 2021-02-19 DIAGNOSIS — I10 Essential (primary) hypertension: Secondary | ICD-10-CM | POA: Diagnosis not present

## 2021-02-19 DIAGNOSIS — Z6839 Body mass index (BMI) 39.0-39.9, adult: Secondary | ICD-10-CM | POA: Diagnosis not present

## 2021-02-19 MED ORDER — TAMSULOSIN HCL 0.4 MG PO CAPS: 0.4000 mg | ORAL_CAPSULE | Freq: Every day | ORAL | 3 refills | Status: DC

## 2021-02-19 MED ORDER — BUPROPION HCL ER (XL) 150 MG PO TB24: 1.0000 | ORAL_TABLET | Freq: Every day | ORAL | 3 refills | Status: DC

## 2021-02-19 MED ORDER — LISINOPRIL-HYDROCHLOROTHIAZIDE 20-25 MG PO TABS: 1.0000 | ORAL_TABLET | Freq: Every day | ORAL | 3 refills | Status: DC

## 2021-02-19 NOTE — Addendum Note (Signed)
Addended by: Perlie Mayo on: 02/19/2021 08:50 AM   Modules accepted: Orders

## 2021-02-19 NOTE — Assessment & Plan Note (Signed)
Stable symptoms with tamsulosin.

## 2021-02-19 NOTE — Progress Notes (Signed)
John Morrow - 70 y.o. male MRN [id number]  Date of birth: 05/30/1951  Subjective Chief Complaint  Patient presents with  . Hyperlipidemia    HPI John Morrow is a 70 y.o. male here today for follow up visit.  He reports that he is doing well.  HTN remains well controlled with lisinopril/hctz.  No notable side effects from this.  Recent labs with normal renal function and potassium.  Tolerating atorvastatin well.  No myalgias with this.  Lab Results  Component Value Date   LDLCALC 66 08/31/2020   Using meloxicam as needed for OA.  Also has rx for tramadol and gabapentin that is prescribed by Dr. Dianah Field for lumbar spondylosis.    ROS:  A comprehensive ROS was completed and negative except as noted per HPI  Allergies  Allergen Reactions  . Morphine And Related     Rash, vomiting, itching     Past Medical History:  Diagnosis Date  . Back pain   . Diverticulosis of colon without hemorrhage 06/12/2016   On Colonoscopy 11/06/2010  . HLD (hyperlipidemia) 05/29/2016  . Hyperlipemia   . Hypertension   . Joint pain   . Morbid obesity ([place]) 05/29/2016  . Prediabetes   . Right hip pain    needs hip replacement  . Sleep apnea     Past Surgical History:  Procedure Laterality Date  . KNEE SURGERY    . REPLACEMENT TOTAL HIP W/  RESURFACING IMPLANTS Right 24/0973   Dr. Aleda Grana    Social History   Socioeconomic History  . Marital status: Married    Spouse name: [name]  . Number of children: 4  . Years of education: 36  . Highest education level: Master's degree (e.g., MA, MS, MEng, MEd, MSW, MBA)  Occupational History  . Occupation: retired    Comment: Pharmacist, hospital  Tobacco Use  . Smoking status: Never Smoker  . Smokeless tobacco: Never Used  Vaping Use  . Vaping Use: Never used  Substance and Sexual Activity  . Alcohol use: Not Currently  . Drug use: No  . Sexual activity: Yes    Partners: Female  Other Topics Concern  . 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 Drinks coffee daily. Drinks a lot of water daily. Delivers groceries to elderly as needed.   Social Determinants of Health   Financial Resource Strain: Not on file  Food Insecurity: Not on file  Transportation Needs: Not on file  Physical Activity: Not on file  Stress: Not on file  Social Connections: Not on file    Family History  Problem Relation Age of Onset  . Lung disease Mother   . Hypertension Mother   . Lung disease Father   . Alcoholism Father     Health Maintenance  Topic Date Due  . COVID-19 Vaccine (3 - Booster for Pfizer series) 06/17/2020  . COLONOSCOPY (Pts 45-2yrs Insurance coverage will need to be confirmed)  11/06/2020  . INFLUENZA VACCINE  04/15/2021  . TETANUS/TDAP  05/29/2026  . Hepatitis C Screening  Completed  . PNA vac Low Risk Adult  Completed  . Zoster Vaccines- Shingrix  Completed  . Pneumococcal Vaccine 53-53 Years old  Aged Out  . HPV VACCINES  Aged Out     ----------------------------------------------------------------------------------------------------------------------------------------------------------------------------------------------------------------- Physical Exam BP 132/76 (BP Location: Left Arm, Patient Position: Sitting, Cuff Size: Large)   Pulse 66   Temp 97.6 F (36.4 C)   Ht 5\' 9"  (1.753 m)  Wt 286 lb (129.7 kg)   SpO2 96%   BMI 42.23 kg/m   Physical Exam Constitutional:      Appearance: Normal appearance.  Eyes:     General: No scleral icterus. Cardiovascular:     Rate and Rhythm: Normal rate and regular rhythm.  Pulmonary:     Effort: Pulmonary effort is normal.     Breath sounds: Normal breath sounds.  Neurological:     General: No focal deficit present.     Mental Status: He is alert.  Psychiatric:        Mood and Affect: Mood normal.        Behavior: Behavior normal.      ------------------------------------------------------------------------------------------------------------------------------------------------------------------------------------------------------------------- Assessment and Plan  HTN (hypertension) HTN remains well controlled.  Recommend continuation of lisinopril/hctz at current strength.  Weight loss and low sodium diet reinforced.   HLD (hyperlipidemia) Doing well with atorvastatin, continue at current strength.   BPH (benign prostatic hyperplasia) Stable symptoms with tamsulosin.   Primary osteoarthritis of left knee Has meloxicam and tramadol.  Given samples of Pennsaid as well.  If this works well he can try OTC voltaren.     Meds ordered this encounter  Medications  . buPROPion (WELLBUTRIN XL) 150 MG 24 hr tablet    Sig: Take 1 tablet (150 mg total) by mouth daily.    Dispense:  90 tablet    Refill:  3  . tamsulosin (FLOMAX) 0.4 MG CAPS capsule    Sig: Take 1 capsule (0.4 mg total) by mouth daily.    Dispense:  90 capsule    Refill:  3  . lisinopril-hydrochlorothiazide (ZESTORETIC) 20-25 MG tablet    Sig: Take 1 tablet by mouth daily.    Dispense:  90 tablet    Refill:  3    No follow-ups on file.    This visit occurred during the SARS-CoV-2 public health emergency.  Safety protocols were in place, including screening questions prior to the visit, additional usage of staff PPE, and extensive cleaning of exam room while observing appropriate contact time as indicated for disinfecting solutions.

## 2021-02-19 NOTE — Assessment & Plan Note (Signed)
Doing well with atorvastatin, continue at current strength.

## 2021-02-19 NOTE — Assessment & Plan Note (Signed)
Has meloxicam and tramadol.  Given samples of Pennsaid as well.  If this works well he can try OTC voltaren.

## 2021-02-19 NOTE — Patient Instructions (Signed)
Great to see you today! Let's continue current medications.  Try the pennsaid 1 packet twice daily as needed.  If this works well over the counter voltaren gel may be helpful as well.  Let's follow up in about 6 months.

## 2021-02-19 NOTE — Assessment & Plan Note (Signed)
HTN remains well controlled.  Recommend continuation of lisinopril/hctz at current strength.  Weight loss and low sodium diet reinforced.

## 2021-03-12 DIAGNOSIS — L578 Other skin changes due to chronic exposure to nonionizing radiation: Secondary | ICD-10-CM | POA: Diagnosis not present

## 2021-03-12 DIAGNOSIS — L570 Actinic keratosis: Secondary | ICD-10-CM | POA: Diagnosis not present

## 2021-03-12 DIAGNOSIS — D1801 Hemangioma of skin and subcutaneous tissue: Secondary | ICD-10-CM | POA: Diagnosis not present

## 2021-03-13 DIAGNOSIS — M1612 Unilateral primary osteoarthritis, left hip: Secondary | ICD-10-CM | POA: Diagnosis not present

## 2021-03-13 DIAGNOSIS — Z471 Aftercare following joint replacement surgery: Secondary | ICD-10-CM | POA: Diagnosis not present

## 2021-03-13 DIAGNOSIS — M461 Sacroiliitis, not elsewhere classified: Secondary | ICD-10-CM | POA: Diagnosis not present

## 2021-03-13 DIAGNOSIS — Z96641 Presence of right artificial hip joint: Secondary | ICD-10-CM | POA: Diagnosis not present

## 2021-04-05 DIAGNOSIS — G4733 Obstructive sleep apnea (adult) (pediatric): Secondary | ICD-10-CM | POA: Diagnosis not present

## 2021-04-08 ENCOUNTER — Other Ambulatory Visit: Payer: Self-pay | Admitting: Sports Medicine

## 2021-04-08 DIAGNOSIS — M545 Low back pain, unspecified: Secondary | ICD-10-CM

## 2021-04-08 MED ORDER — GABAPENTIN 300 MG PO CAPS: 300.0000 mg | ORAL_CAPSULE | Freq: Three times a day (TID) | ORAL | 3 refills | Status: DC

## 2021-05-10 ENCOUNTER — Ambulatory Visit (AMBULATORY_SURGERY_CENTER): Payer: Medicare PPO

## 2021-05-10 ENCOUNTER — Other Ambulatory Visit: Payer: Self-pay

## 2021-05-10 VITALS — Ht 69.0 in | Wt 270.0 lb

## 2021-05-10 DIAGNOSIS — Z1211 Encounter for screening for malignant neoplasm of colon: Secondary | ICD-10-CM

## 2021-05-10 MED ORDER — PEG-KCL-NACL-NASULF-NA ASC-C 100 G PO SOLR: 1.0000 | Freq: Once | ORAL | 0 refills | Status: AC

## 2021-05-10 NOTE — Progress Notes (Signed)

## 2021-05-24 ENCOUNTER — Other Ambulatory Visit: Payer: Self-pay

## 2021-05-24 ENCOUNTER — Encounter: Payer: Self-pay | Admitting: Gastroenterology

## 2021-05-24 ENCOUNTER — Ambulatory Visit (AMBULATORY_SURGERY_CENTER): Payer: Medicare PPO | Admitting: Gastroenterology

## 2021-05-24 VITALS — BP 126/70 | HR 65 | Temp 98.6°F | Resp 12 | Ht 69.0 in | Wt 270.0 lb

## 2021-05-24 DIAGNOSIS — G4733 Obstructive sleep apnea (adult) (pediatric): Secondary | ICD-10-CM | POA: Diagnosis not present

## 2021-05-24 DIAGNOSIS — Z1211 Encounter for screening for malignant neoplasm of colon: Secondary | ICD-10-CM

## 2021-05-24 DIAGNOSIS — D122 Benign neoplasm of ascending colon: Secondary | ICD-10-CM | POA: Diagnosis not present

## 2021-05-24 DIAGNOSIS — I10 Essential (primary) hypertension: Secondary | ICD-10-CM | POA: Diagnosis not present

## 2021-05-24 DIAGNOSIS — K5730 Diverticulosis of large intestine without perforation or abscess without bleeding: Secondary | ICD-10-CM

## 2021-05-24 MED ORDER — SODIUM CHLORIDE 0.9 % IV SOLN: 500.0000 mL | Freq: Once | INTRAVENOUS | Status: DC

## 2021-05-24 NOTE — Op Note (Addendum)
[place] Patient Name: John Morrow Procedure Date: 05/24/2021 9:10 AM MRN: [id number] Endoscopist: Gerrit Heck , MD Age: 70 Referring MD:  Date of Birth: 1951/03/10 Gender: Male Account #: [id number] Procedure:                Colonoscopy Indications:              Screening for colorectal malignant neoplasm (last                            colonoscopy was 10 years ago)                           Colonoscopy in 10/2010 notable for diverticulosis.                            No polyps.                           No recent GI sxs and no known family history of                            colon cancer. Medicines:                Monitored Anesthesia Care Procedure:                Pre-Anesthesia Assessment:                           - Prior to the procedure, a History and Physical                            was performed, and patient medications and                            allergies were reviewed. The patient's tolerance of                            previous anesthesia was also reviewed. The risks                            and benefits of the procedure and the sedation                            options and risks were discussed with the patient.                            All questions were answered, and informed consent                            was obtained. Prior Anticoagulants: The patient has                            taken no previous anticoagulant or antiplatelet                            agents.  ASA Grade Assessment: II - A patient with                            mild systemic disease. After reviewing the risks                            and benefits, the patient was deemed in                            satisfactory condition to undergo the procedure.                           After obtaining informed consent, the colonoscope                            was passed under direct vision. Throughout the                            procedure, the patient's blood  pressure, pulse, and                            oxygen saturations were monitored continuously. The                            CF [id number] was introduced through the anus                            and advanced to the the cecum, identified by                            appendiceal orifice and ileocecal valve. The                            colonoscopy was technically difficult and complex                            due to poor bowel prep. The patient tolerated the                            procedure well. The quality of the bowel                            preparation was poor. The ileocecal valve,                            appendiceal orifice, and rectum were photographed. Scope In: 9:16:11 AM Scope Out: 9:29:52 AM Scope Withdrawal Time: 0 hours 10 minutes 38 seconds  Total Procedure Duration: 0 hours 13 minutes 41 seconds  Findings:                 The perianal and digital rectal examinations were                            normal.  A 4 mm polyp was found in the ascending colon. The                            polyp was sessile. The polyp was removed with a                            cold snare. Resection and retrieval were complete.                            Estimated blood loss was minimal.                           Multiple small-mouthed diverticula were found in                            the sigmoid colon and ascending colon.                           A moderate amount of liquid stool and solid                            food/stool debris was found in the entire colon,                            interfering with visualization. Lavage of the area                            was performed using copious amounts of tap water,                            resulting in incomplete clearance with fair                            visualization.                           The retroflexed view of the distal rectum and anal                            verge was  normal and showed no anal or rectal                            abnormalities. Complications:            No immediate complications. Estimated Blood Loss:     Estimated blood loss was minimal. Impression:               - Preparation of the colon was poor.                           - One 4 mm polyp in the ascending colon, removed                            with a cold snare. Resected and retrieved.                           -  Diverticulosis in the sigmoid colon and in the                            ascending colon.                           - Stool in the entire examined colon.                           - The distal rectum and anal verge are normal on                            retroflexion view. Recommendation:           - Patient has a contact number available for                            emergencies. The signs and symptoms of potential                            delayed complications were discussed with the                            patient. Return to normal activities tomorrow.                            Written discharge instructions were provided to the                            patient.                           - Resume previous diet.                           - Continue present medications.                           - Await pathology results.                           - Repeat colonoscopy within 1 year because the                            bowel preparation was poor.                           - Return to GI office PRN. Gerrit Heck, MD 05/24/2021 9:36:04 AM

## 2021-05-24 NOTE — Progress Notes (Signed)
Pt's states no medical or surgical changes since previsit or office visit.    Check-in-aer  V/s-dt

## 2021-05-24 NOTE — Progress Notes (Signed)
GASTROENTEROLOGY PROCEDURE H&P NOTE   Primary Care Physician: Luetta Nutting, DO    Reason for Procedure:  Colon Cancer screening  Plan:    Colonoscopy  Patient is appropriate for endoscopic procedure(s) in the ambulatory ([place]) setting.  The nature of the procedure, as well as the risks, benefits, and alternatives were carefully and thoroughly reviewed with the patient. Ample time for discussion and questions allowed. The patient understood, was satisfied, and agreed to proceed.     HPI: John Morrow is a 70 y.o. male who presents for colonoscopy for ongoing Colon Cancer screening.  Last colonoscopy was 2012 and notable for diverticulosis, otherwise normal as noted below.  No active GI symptoms.  No known family history of colon cancer or related malignancy.  Patient is otherwise without complaints or active issues today.  -Colonoscopy (10/2010): Diverticulosis, otherwise normal  Past Medical History:  Diagnosis Date   Arthritis    Back pain    Diverticulosis of colon without hemorrhage 06/12/2016   On Colonoscopy 11/06/2010   HLD (hyperlipidemia) 05/29/2016   Hyperlipemia    Hypertension    Joint pain    Morbid obesity ([place]) 05/29/2016   Prediabetes    Right hip pain    needs hip replacement   Sleep apnea    on CPAP    Past Surgical History:  Procedure Laterality Date   KNEE SURGERY     REPLACEMENT TOTAL HIP W/  RESURFACING IMPLANTS Right [id number]   Dr. Aleda Grana    Prior to Admission medications   Medication Sig Start Date End Date Taking? Authorizing Provider  atorvastatin (LIPITOR) 40 MG tablet Take 0.5 tablets (20 mg total) by mouth daily. 07/16/20  Yes Luetta Nutting, DO  buPROPion (WELLBUTRIN XL) 150 MG 24 hr tablet Take 1 tablet (150 mg total) by mouth daily. 02/19/21  Yes Luetta Nutting, DO  gabapentin (NEURONTIN) 300 MG capsule Take 1 capsule (300 mg total) by mouth 3 (three) times daily. 04/08/21  Yes Silverio Decamp, MD   lisinopril-hydrochlorothiazide (ZESTORETIC) 20-25 MG tablet Take 1 tablet by mouth daily. 02/19/21  Yes Luetta Nutting, DO  tamsulosin (FLOMAX) 0.4 MG CAPS capsule Take 1 capsule (0.4 mg total) by mouth daily. 02/19/21  Yes Luetta Nutting, DO  traMADol (ULTRAM) 50 MG tablet TAKE 1 TO 2 TABLETS BY MOUTH EVERY 8 HOURS AS NEEDED FOR MODERATE PAIN . DO NOT EXCEED 6 PER 24 HOURS Patient not taking: No sig reported 08/08/20   Silverio Decamp, MD    Current Outpatient Medications  Medication Sig Dispense Refill   atorvastatin (LIPITOR) 40 MG tablet Take 0.5 tablets (20 mg total) by mouth daily. 90 tablet 3   buPROPion (WELLBUTRIN XL) 150 MG 24 hr tablet Take 1 tablet (150 mg total) by mouth daily. 90 tablet 3   gabapentin (NEURONTIN) 300 MG capsule Take 1 capsule (300 mg total) by mouth 3 (three) times daily. 270 capsule 3   lisinopril-hydrochlorothiazide (ZESTORETIC) 20-25 MG tablet Take 1 tablet by mouth daily. 90 tablet 3   tamsulosin (FLOMAX) 0.4 MG CAPS capsule Take 1 capsule (0.4 mg total) by mouth daily. 90 capsule 3   traMADol (ULTRAM) 50 MG tablet TAKE 1 TO 2 TABLETS BY MOUTH EVERY 8 HOURS AS NEEDED FOR MODERATE PAIN . DO NOT EXCEED 6 PER 24 HOURS (Patient not taking: No sig reported) 60 tablet 0   Current Facility-Administered Medications  Medication Dose Route Frequency Provider Last Rate Last Admin   0.9 %  sodium chloride infusion  500 mL  Intravenous Once Leeland Lovelady V, DO        Allergies as of 05/24/2021 - Review Complete 05/24/2021  Allergen Reaction Noted   Morphine and related  02/17/2013    Family History  Problem Relation Age of Onset   Lung disease Mother    Hypertension Mother    Lung disease Father    Alcoholism Father    Colon cancer Neg Hx    Colon polyps Neg Hx    Esophageal cancer Neg Hx    Stomach cancer Neg Hx    Rectal cancer Neg Hx     Social History   Socioeconomic History   Marital status: Married    Spouse name: [name]   Number of  children: 4   Years of education: 18   Highest education level: Master's degree (e.g., MA, MS, MEng, MEd, MSW, MBA)  Occupational History   Occupation: retired    Comment: Pharmacist, hospital  Tobacco Use   Smoking status: Never   Smokeless tobacco: Never  Vaping Use   Vaping Use: Never used  Substance and Sexual Activity   Alcohol use: Not Currently   Drug use: No   Sexual activity: Yes    Partners: Female  Other Topics Concern   Not on file  Social History Narrative   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 Drinks coffee daily. Drinks a lot of water daily. Delivers groceries to elderly as needed.   Social Determinants of Health   Financial Resource Strain: Not on file  Food Insecurity: Not on file  Transportation Needs: Not on file  Physical Activity: Not on file  Stress: Not on file  Social Connections: Not on file  Intimate Partner Violence: Not on file    Physical Exam: Vital signs in last 24 hours: '@BP'$  114/75 (Patient Position: Sitting)   Pulse 78   Temp 98.6 F (37 C)   Ht '5\' 9"'$  (1.753 m)   Wt 270 lb (122.5 kg)   SpO2 96%   BMI 39.87 kg/m  GEN: NAD EYE: Sclerae anicteric ENT: MMM CV: Non-tachycardic Pulm: CTA b/l GI: Soft, NT/ND NEURO:  Alert & Oriented x 3   Gerrit Heck, DO [facility]   05/24/2021 9:10 AM

## 2021-05-24 NOTE — Progress Notes (Signed)
Report to PACU, RN, vss, BBS= Clear.  

## 2021-05-24 NOTE — Progress Notes (Signed)
Called to room to assist during endoscopic procedure.  Patient ID and intended procedure confirmed with present staff. Received instructions for my participation in the procedure from the performing physician.  

## 2021-05-24 NOTE — Patient Instructions (Signed)
Resume previous diet and present medications. Awaiting pathology results. Repeat Colonoscopy within 1 year because the bowel prep was poor. Return to GI office as needed.  YOU HAD AN ENDOSCOPIC PROCEDURE TODAY AT [facility]:   Refer to the procedure report that was given to you for any specific questions about what was found during the examination.  If the procedure report does not answer your questions, please call your gastroenterologist to clarify.  If you requested that your care partner not be given the details of your procedure findings, then the procedure report has been included in a sealed envelope for you to review at your convenience later.  YOU SHOULD EXPECT: Some feelings of bloating in the abdomen. Passage of more gas than usual.  Walking can help get rid of the air that was put into your GI tract during the procedure and reduce the bloating. If you had a lower endoscopy (such as a colonoscopy or flexible sigmoidoscopy) you may notice spotting of blood in your stool or on the toilet paper. If you underwent a bowel prep for your procedure, you may not have a normal bowel movement for a few days.  Please Note:  You might notice some irritation and congestion in your nose or some drainage.  This is from the oxygen used during your procedure.  There is no need for concern and it should clear up in a day or so.  SYMPTOMS TO REPORT IMMEDIATELY:  Following lower endoscopy (colonoscopy or flexible sigmoidoscopy):  Excessive amounts of blood in the stool  Significant tenderness or worsening of abdominal pains  Swelling of the abdomen that is new, acute  Fever of 100F or higher  For urgent or emergent issues, a gastroenterologist can be reached at any hour by calling [phone]. Do not use MyChart messaging for urgent concerns.    DIET:  We do recommend a small meal at first, but then you may proceed to your regular diet.  Drink plenty of fluids but you should avoid  alcoholic beverages for 24 hours.  ACTIVITY:  You should plan to take it easy for the rest of today and you should NOT DRIVE or use heavy machinery until tomorrow (because of the sedation medicines used during the test).    FOLLOW UP: Our staff will call the number listed on your records 48-72 hours following your procedure to check on you and address any questions or concerns that you may have regarding the information given to you following your procedure. If we do not reach you, we will leave a message.  We will attempt to reach you two times.  During this call, we will ask if you have developed any symptoms of COVID 19. If you develop any symptoms (ie: fever, flu-like symptoms, shortness of breath, cough etc.) before then, please call [phone].  If you test positive for Covid 19 in the 2 weeks post procedure, please call and report this information to [name].    If any biopsies were taken you will be contacted by phone or by letter within the next 1-3 weeks.  Please call us at [phone] if you have not heard about the biopsies in 3 weeks.    SIGNATURES/CONFIDENTIALITY: You and/or your care partner have signed paperwork which will be entered into your electronic medical record.  These signatures attest to the fact that that the information above on your After Visit Summary has been reviewed and is understood.  Full responsibility of the confidentiality of this discharge  information lies with you and/or your care-partner.

## 2021-05-28 ENCOUNTER — Telehealth: Payer: Self-pay | Admitting: *Deleted

## 2021-05-28 NOTE — Telephone Encounter (Signed)
  Follow up Call-  Call back number 05/24/2021  Post procedure Call Back phone  # [phone]  Permission to leave phone message Yes  Some recent data might be hidden     Patient questions:  Do you have a fever, pain , or abdominal swelling? No. Pain Score  0 *  Have you tolerated food without any problems? Yes.    Have you been able to return to your normal activities? Yes.    Do you have any questions about your discharge instructions: Diet   No. Medications  No. Follow up visit  No.  Do you have questions or concerns about your Care? No.  Actions: * If pain score is 4 or above: No action needed, pain <4.

## 2021-05-28 NOTE — Telephone Encounter (Signed)
  Follow up Call-  Call back number 05/24/2021  Post procedure Call Back phone  # [phone]  Permission to leave phone message Yes  Some recent data might be hidden    No answer at 2nd attempt follow up phone call.  Left message on voicemail.

## 2021-06-03 ENCOUNTER — Encounter: Payer: Self-pay | Admitting: Gastroenterology

## 2021-07-01 ENCOUNTER — Other Ambulatory Visit: Payer: Self-pay | Admitting: Sports Medicine

## 2021-07-01 DIAGNOSIS — M1712 Unilateral primary osteoarthritis, left knee: Secondary | ICD-10-CM

## 2021-07-03 ENCOUNTER — Ambulatory Visit (INDEPENDENT_AMBULATORY_CARE_PROVIDER_SITE_OTHER): Payer: Medicare PPO | Admitting: Family Medicine

## 2021-07-03 VITALS — BP 144/84 | HR 71 | Temp 97.9°F

## 2021-07-03 DIAGNOSIS — Z23 Encounter for immunization: Secondary | ICD-10-CM | POA: Diagnosis not present

## 2021-07-03 NOTE — Patient Instructions (Signed)
Influenza (Flu) Vaccine (Inactivated or Recombinant): What You Need to Know 1. Why get vaccinated? Influenza vaccine can prevent influenza (flu). Flu is a contagious disease that spreads around the [place] every year, usually between October and May. Anyone can get the flu, but it is more dangerous for some people. Infants and young children, people 65 years and older, pregnant people, and people with certain health conditions or a weakened immune system are at greatest risk of flu complications. Pneumonia, bronchitis, sinus infections, and ear infections are examples of flu-related complications. If you have a medical condition, such as heart disease, cancer, or diabetes, flu can make it worse. Flu can cause fever and chills, sore throat, muscle aches, fatigue, cough, headache, and runny or stuffy nose. Some people may have vomiting and diarrhea, though this is more common in children than adults. In an average year, thousands of people in the [place] die from flu, and many more are hospitalized. Flu vaccine prevents millions of illnesses and flu-related visits to the doctor each year. 2. Influenza vaccines CDC recommends everyone 6 months and older get vaccinated every flu season. Children 6 months through 8 years of age may need 2 doses during a single flu season. Everyone else needs only 1 dose each flu season. It takes about 2 weeks for protection to develop after vaccination. There are many flu viruses, and they are always changing. Each year a new flu vaccine is made to protect against the influenza viruses believed to be likely to cause disease in the upcoming flu season. Even when the vaccine doesn't exactly match these viruses, it may still provide some protection. Influenza vaccine does not cause flu. Influenza vaccine may be given at the same time as other vaccines. 3. Talk with your health care provider Tell your vaccination provider if the person getting the vaccine: Has had  an allergic reaction after a previous dose of influenza vaccine, or has any severe, life-threatening allergies Has ever had Guillain-Barr Syndrome (also called "GBS") In some cases, your health care provider may decide to postpone influenza vaccination until a future visit. Influenza vaccine can be administered at any time during pregnancy. People who are or will be pregnant during influenza season should receive inactivated influenza vaccine. People with minor illnesses, such as a cold, may be vaccinated. People who are moderately or severely ill should usually wait until they recover before getting influenza vaccine. Your health care provider can give you more information. 4. Risks of a vaccine reaction Soreness, redness, and swelling where the shot is given, fever, muscle aches, and headache can happen after influenza vaccination. There may be a very small increased risk of Guillain-Barr Syndrome (GBS) after inactivated influenza vaccine (the flu shot). Young children who get the flu shot along with pneumococcal vaccine (PCV13) and/or DTaP vaccine at the same time might be slightly more likely to have a seizure caused by fever. Tell your health care provider if a child who is getting flu vaccine has ever had a seizure. People sometimes faint after medical procedures, including vaccination. Tell your provider if you feel dizzy or have vision changes or ringing in the ears. As with any medicine, there is a very remote chance of a vaccine causing a severe allergic reaction, other serious injury, or death. 5. What if there is a serious problem? An allergic reaction could occur after the vaccinated person leaves the clinic. If you see signs of a severe allergic reaction (hives, swelling of the face and throat, difficulty breathing,   a fast heartbeat, dizziness, or weakness), call 9-1-1 and get the person to the nearest hospital. For other signs that concern you, call your health care provider. Adverse  reactions should be reported to the Vaccine Adverse Event Reporting System (VAERS). Your health care provider will usually file this report, or you can do it yourself. Visit the VAERS website at www.vaers.hhs.gov or call 1-800-822-7967. VAERS is only for reporting reactions, and VAERS staff members do not give medical advice. 6. The National Vaccine Injury Compensation Program The National Vaccine Injury Compensation Program (VICP) is a federal program that was created to compensate people who may have been injured by certain vaccines. Claims regarding alleged injury or death due to vaccination have a time limit for filing, which may be as short as two years. Visit the VICP website at www.hrsa.gov/vaccinecompensation or call 1-800-338-2382 to learn about the program and about filing a claim. 7. How can I learn more? Ask your health care provider. Call your local or state health department. Visit the website of the Food and Drug Administration (FDA) for vaccine package inserts and additional information at www.fda.gov/vaccines-blood-biologics/vaccines. Contact the Centers for Disease Control and Prevention (CDC): Call 1-800-232-4636 (1-800-CDC-INFO) or Visit CDC's website at www.cdc.gov/flu. Vaccine Information Statement Inactivated Influenza Vaccine (04/20/2020) This information is not intended to replace advice given to you by your health care provider. Make sure you discuss any questions you have with your health care provider. Document Revised: 06/07/2020 Document Reviewed: 06/07/2020 Elsevier Patient Education  2022 Elsevier Inc.  

## 2021-07-12 ENCOUNTER — Other Ambulatory Visit: Payer: Self-pay

## 2021-07-12 DIAGNOSIS — E782 Mixed hyperlipidemia: Secondary | ICD-10-CM

## 2021-07-12 MED ORDER — ATORVASTATIN CALCIUM 20 MG PO TABS: 20.0000 mg | ORAL_TABLET | Freq: Every day | ORAL | 3 refills | Status: DC

## 2021-08-04 ENCOUNTER — Encounter: Payer: Self-pay | Admitting: Family Medicine

## 2021-08-04 DIAGNOSIS — I10 Essential (primary) hypertension: Secondary | ICD-10-CM

## 2021-08-04 DIAGNOSIS — E782 Mixed hyperlipidemia: Secondary | ICD-10-CM

## 2021-08-04 DIAGNOSIS — N400 Enlarged prostate without lower urinary tract symptoms: Secondary | ICD-10-CM

## 2021-08-07 NOTE — Telephone Encounter (Signed)
Updated lab orders entered

## 2021-08-15 DIAGNOSIS — N400 Enlarged prostate without lower urinary tract symptoms: Secondary | ICD-10-CM | POA: Diagnosis not present

## 2021-08-15 DIAGNOSIS — E782 Mixed hyperlipidemia: Secondary | ICD-10-CM | POA: Diagnosis not present

## 2021-08-15 DIAGNOSIS — I10 Essential (primary) hypertension: Secondary | ICD-10-CM | POA: Diagnosis not present

## 2021-08-16 LAB — CBC WITH DIFFERENTIAL/PLATELET
Absolute Monocytes: 472 cells/uL (ref 200–950)
Basophils Absolute: 29 cells/uL (ref 0–200)
Basophils Relative: 0.7 %
Eosinophils Absolute: 139 cells/uL (ref 15–500)
Eosinophils Relative: 3.4 %
HCT: 44.7 % (ref 38.5–50.0)
Hemoglobin: 14.8 g/dL (ref 13.2–17.1)
Lymphs Abs: 1074 cells/uL (ref 850–3900)
MCH: 29.6 pg (ref 27.0–33.0)
MCHC: 33.1 g/dL (ref 32.0–36.0)
MCV: 89.4 fL (ref 80.0–100.0)
MPV: 10.7 fL (ref 7.5–12.5)
Monocytes Relative: 11.5 %
Neutro Abs: 2386 cells/uL (ref 1500–7800)
Neutrophils Relative %: 58.2 %
Platelets: 174 10*3/uL (ref 140–400)
RBC: 5 10*6/uL (ref 4.20–5.80)
RDW: 11.8 % (ref 11.0–15.0)
Total Lymphocyte: 26.2 %
WBC: 4.1 10*3/uL (ref 3.8–10.8)

## 2021-08-16 LAB — COMPLETE METABOLIC PANEL WITH GFR
AG Ratio: 1.6 (calc) (ref 1.0–2.5)
ALT: 17 U/L (ref 9–46)
AST: 18 U/L (ref 10–35)
Albumin: 3.9 g/dL (ref 3.6–5.1)
Alkaline phosphatase (APISO): 55 U/L (ref 35–144)
BUN: 23 mg/dL (ref 7–25)
CO2: 32 mmol/L (ref 20–32)
Calcium: 8.5 mg/dL — ABNORMAL LOW (ref 8.6–10.3)
Chloride: 105 mmol/L (ref 98–110)
Creat: 1.14 mg/dL (ref 0.70–1.28)
Globulin: 2.5 g/dL (calc) (ref 1.9–3.7)
Glucose, Bld: 100 mg/dL — ABNORMAL HIGH (ref 65–99)
Potassium: 3.5 mmol/L (ref 3.5–5.3)
Sodium: 144 mmol/L (ref 135–146)
Total Bilirubin: 0.5 mg/dL (ref 0.2–1.2)
Total Protein: 6.4 g/dL (ref 6.1–8.1)
eGFR: 69 mL/min/{1.73_m2} (ref >=60)

## 2021-08-16 LAB — LIPID PANEL W/REFLEX DIRECT LDL
Cholesterol: 102 mg/dL (ref <200)
HDL: 33 mg/dL — ABNORMAL LOW (ref >=40)
LDL Cholesterol (Calc): 51 mg/dL (calc)
Non-HDL Cholesterol (Calc): 69 mg/dL (calc) (ref <130)
Total CHOL/HDL Ratio: 3.1 (calc) (ref <5.0)
Triglycerides: 96 mg/dL (ref <150)

## 2021-08-16 LAB — PSA: PSA: 1.16 ng/mL (ref <=4.00)

## 2021-08-21 ENCOUNTER — Other Ambulatory Visit: Payer: Self-pay

## 2021-08-21 ENCOUNTER — Encounter: Payer: Self-pay | Admitting: Family Medicine

## 2021-08-21 ENCOUNTER — Ambulatory Visit: Payer: Medicare PPO | Admitting: Family Medicine

## 2021-08-21 DIAGNOSIS — I10 Essential (primary) hypertension: Secondary | ICD-10-CM

## 2021-08-21 DIAGNOSIS — J310 Chronic rhinitis: Secondary | ICD-10-CM

## 2021-08-21 DIAGNOSIS — E782 Mixed hyperlipidemia: Secondary | ICD-10-CM

## 2021-08-21 DIAGNOSIS — N400 Enlarged prostate without lower urinary tract symptoms: Secondary | ICD-10-CM

## 2021-08-21 MED ORDER — IPRATROPIUM BROMIDE 0.03 % NA SOLN: 2.0000 | Freq: Two times a day (BID) | NASAL | 12 refills | Status: DC

## 2021-08-21 NOTE — Assessment & Plan Note (Signed)
Lab Results  Component Value Date   LDLCALC 51 08/15/2021  Continues to tolerate atorvastatin well.  Continue at current strength.

## 2021-08-21 NOTE — Patient Instructions (Signed)
Great to see you today! Continue current medications Try ipratropium nasal spray.

## 2021-08-21 NOTE — Progress Notes (Signed)
John Morrow - 70 y.o. male MRN [id number]  Date of birth: May 15, 1951  Subjective Chief Complaint  Patient presents with   Hypertension   Hyperlipidemia    HPI John Morrow is a 70 year old male here today for follow-up visit.  Reports that overall he is doing well.  He is having some issues with recurrent nasal congestion.  He has tried antihistamines and Flonase without much improvement.  Continues to do well with chronic medications and ha=is blood pressure has remained well controlled.  Tolerating atorvastatin well for management of hyperlipidemia.  Mood is stable with bupropion at current strength.  ROS:  A comprehensive ROS was completed and negative except as noted per HPI  Allergies  Allergen Reactions   Morphine And Related     Rash, vomiting, itching     Past Medical History:  Diagnosis Date   Arthritis    Back pain    Diverticulosis of colon without hemorrhage 06/12/2016   On Colonoscopy 11/06/2010   HLD (hyperlipidemia) 05/29/2016   Hyperlipemia    Hypertension    Joint pain    Morbid obesity ([place]) 05/29/2016   Prediabetes    Right hip pain    needs hip replacement   Sleep apnea    on CPAP    Past Surgical History:  Procedure Laterality Date   KNEE SURGERY     REPLACEMENT TOTAL HIP W/  RESURFACING IMPLANTS Right 56/4332   Dr. Aleda Grana    Social History   Socioeconomic History   Marital status: Married    Spouse name: John Morrow   Number of children: 4   Years of education: 18   Highest education level: Conservator, museum/gallery (e.g., MA, MS, MEng, MEd, MSW, MBA)  Occupational History   Occupation: retired    Comment: Pharmacist, hospital  Tobacco Use   Smoking status: Never   Smokeless tobacco: Never  Vaping Use   Vaping Use: Never used  Substance and Sexual Activity   Alcohol use: Not Currently   Drug use: No   Sexual activity: Yes    Partners: Female  Other Topics Concern   Not on file  Social History Narrative   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  Drinks coffee daily. Drinks a lot of water daily. Delivers groceries to elderly as needed.   Social Determinants of Health   Financial Resource Strain: Not on file  Food Insecurity: Not on file  Transportation Needs: Not on file  Physical Activity: Not on file  Stress: Not on file  Social Connections: Not on file    Family History  Problem Relation Age of Onset   Lung disease Mother    Hypertension Mother    Lung disease Father    Alcoholism Father    Colon cancer Neg Hx    Colon polyps Neg Hx    Esophageal cancer Neg Hx    Stomach cancer Neg Hx    Rectal cancer Neg Hx     Health Maintenance  Topic Date Due   COVID-19 Vaccine (3 - Booster for [place] series) 03/12/2020   COLONOSCOPY (Pts 45-36yrs Insurance coverage will need to be confirmed)  05/24/2022   TETANUS/TDAP  05/29/2026   Pneumonia Vaccine 44+ Years old  Completed   INFLUENZA VACCINE  Completed   Hepatitis C Screening  Completed   Zoster Vaccines- Shingrix  Completed   HPV VACCINES  Aged Out     ----------------------------------------------------------------------------------------------------------------------------------------------------------------------------------------------------------------- Physical Exam BP 130/82 (BP Location: Left Arm, Patient Position: Sitting, Cuff Size: Large)   Pulse 78  Ht 5\' 9"  (1.753 m)   Wt 290 lb (131.5 kg)   SpO2 97%   BMI 42.83 kg/m   Physical Exam Constitutional:      Appearance: Normal appearance.  HENT:     Head: Normocephalic and atraumatic.  Eyes:     General: No scleral icterus. Cardiovascular:     Rate and Rhythm: Normal rate and regular rhythm.  Pulmonary:     Effort: Pulmonary effort is normal.     Breath sounds: Normal breath sounds.  Musculoskeletal:     Cervical back: Neck supple.  Neurological:     General: No focal deficit present.     Mental Status: He is alert.  Psychiatric:        Mood and Affect: Mood normal.        Behavior:  Behavior normal.    ------------------------------------------------------------------------------------------------------------------------------------------------------------------------------------------------------------------- Assessment and Plan  HTN (hypertension) Blood pressure remains well controlled.  Recommend continuation of current medication for management of hypertension.  BPH (benign prostatic hyperplasia) Stable lower urinary tract symptoms with tamsulosin.  Recommend continuation.  HLD (hyperlipidemia) Lab Results  Component Value Date   LDLCALC 51 08/15/2021  Continues to tolerate atorvastatin well.  Continue at current strength.  Chronic rhinitis Adding trial of Atrovent nasal spray.   Meds ordered this encounter  Medications   ipratropium (ATROVENT) 0.03 % nasal spray    Sig: Place 2 sprays into both nostrils every 12 (twelve) hours.    Dispense:  30 mL    Refill:  12    No follow-ups on file.    This visit occurred during the SARS-CoV-2 public health emergency.  Safety protocols were in place, including screening questions prior to the visit, additional usage of staff PPE, and extensive cleaning of exam room while observing appropriate contact time as indicated for disinfecting solutions.

## 2021-08-21 NOTE — Assessment & Plan Note (Addendum)
Stable lower urinary tract symptoms with tamsulosin.  Recommend continuation.

## 2021-08-21 NOTE — Assessment & Plan Note (Signed)
Blood pressure remains well-controlled.  Recommend continuation of current medication for management of hypertension.   

## 2021-08-21 NOTE — Assessment & Plan Note (Signed)
Adding trial of Atrovent nasal spray.

## 2021-09-05 DIAGNOSIS — F325 Major depressive disorder, single episode, in full remission: Secondary | ICD-10-CM | POA: Diagnosis not present

## 2021-09-05 DIAGNOSIS — M1990 Unspecified osteoarthritis, unspecified site: Secondary | ICD-10-CM | POA: Diagnosis not present

## 2021-09-05 DIAGNOSIS — Z6841 Body Mass Index (BMI) 40.0 and over, adult: Secondary | ICD-10-CM | POA: Diagnosis not present

## 2021-09-05 DIAGNOSIS — I10 Essential (primary) hypertension: Secondary | ICD-10-CM | POA: Diagnosis not present

## 2021-09-05 DIAGNOSIS — G8929 Other chronic pain: Secondary | ICD-10-CM | POA: Diagnosis not present

## 2021-09-05 DIAGNOSIS — M461 Sacroiliitis, not elsewhere classified: Secondary | ICD-10-CM | POA: Diagnosis not present

## 2021-09-05 DIAGNOSIS — G4733 Obstructive sleep apnea (adult) (pediatric): Secondary | ICD-10-CM | POA: Diagnosis not present

## 2021-09-05 DIAGNOSIS — E785 Hyperlipidemia, unspecified: Secondary | ICD-10-CM | POA: Diagnosis not present

## 2021-09-11 DIAGNOSIS — L570 Actinic keratosis: Secondary | ICD-10-CM | POA: Diagnosis not present

## 2021-09-11 DIAGNOSIS — L578 Other skin changes due to chronic exposure to nonionizing radiation: Secondary | ICD-10-CM | POA: Diagnosis not present

## 2021-09-23 ENCOUNTER — Ambulatory Visit (INDEPENDENT_AMBULATORY_CARE_PROVIDER_SITE_OTHER): Payer: Medicare PPO | Admitting: Family Medicine

## 2021-09-23 DIAGNOSIS — Z0000 Encounter for general adult medical examination without abnormal findings: Secondary | ICD-10-CM

## 2021-09-23 NOTE — Progress Notes (Signed)
MEDICARE ANNUAL WELLNESS VISIT  09/23/2021  Telephone Visit Disclaimer This Medicare AWV was conducted by telephone due to national recommendations for restrictions regarding the COVID-19 Pandemic (e.g. social distancing).  I verified, using two identifiers, that I am speaking with John Morrow or their authorized healthcare agent. I discussed the limitations, risks, security, and privacy concerns of performing an evaluation and management service by telephone and the potential availability of an in-person appointment in the future. The patient expressed understanding and agreed to proceed.  Location of Patient: Home Location of Provider (nurse):  In the office.  Subjective:    John Morrow is a 71 y.o. male patient of Luetta Nutting, DO who had a Medicare Annual Wellness Visit today via telephone. John Morrow is Retired and lives with their spouse. he has 4 children. he reports that he is socially active and does interact with friends/family regularly. he is minimally physically active and enjoys spending time with grandchildren. 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  Patient Care Team: Luetta Nutting, DO as PCP - General (Family Medicine)  Advanced Directives 09/23/2021 02/13/2020 09/07/2019 09/06/2018 07/23/2017 05/29/2016  Does Patient Have a Medical Advance Directive? Yes Yes Yes Yes Yes Yes  Type of Advance Directive Living will;Healthcare Power of [name];[name];[name];Out of facility DNR (pink MOST or yellow form);Living will  Does patient want to make changes to medical advance directive? No - Patient declined - No - Patient declined No - Patient declined Yes (Inpatient - patient defers changing a medical advance directive at this time) No - Patient declined  Copy of [place] in  Chart? No - copy requested - Yes - validated most recent copy scanned in chart (See row information) Yes - validated most recent copy scanned in chart (See row information) Yes No - copy requested    Hospital Utilization Over the Past 12 Months: # of hospitalizations or ER visits: 0 # of surgeries: 0  Review of Systems    Patient reports that his overall health is unchanged compared to last year.  History obtained from chart review and the patient  Patient Reported Readings (BP, Pulse, CBG, Weight, etc) none  Pain Assessment Pain : No/denies pain     Current Medications & Allergies (verified) Allergies as of 09/23/2021       Reactions   Morphine And Related    Rash, vomiting, itching        Medication List        Accurate as of September 23, 2021  8:32 AM. If you have any questions, ask your nurse or doctor.          atorvastatin 20 MG tablet Commonly known as: LIPITOR Take 1 tablet (20 mg total) by mouth daily.   buPROPion 150 MG 24 hr tablet Commonly known as: WELLBUTRIN XL Take 1 tablet (150 mg total) by mouth daily.   gabapentin 300 MG capsule Commonly known as: NEURONTIN Take 1 capsule (300 mg total) by mouth 3 (three) times daily.   ipratropium 0.03 % nasal spray Commonly known as: ATROVENT Place 2 sprays into both nostrils every 12 (twelve) hours.   lisinopril-hydrochlorothiazide 20-25 MG tablet Commonly known as: ZESTORETIC Take 1 tablet by mouth daily.   tamsulosin 0.4 MG Caps capsule Commonly known as: FLOMAX Take 1 capsule (0.4 mg total) by mouth daily.   traMADol  50 MG tablet Commonly known as: ULTRAM TAKE 1 TO 2 TABLETS BY MOUTH EVERY 8 HOURS AS NEEDED FOR MODERATE PAIN . DO NOT EXCEED 6 PER 24 HOURS        History (reviewed): Past Medical History:  Diagnosis Date   Arthritis    Back pain    Diverticulosis of colon without hemorrhage 06/12/2016   On Colonoscopy 11/06/2010   HLD (hyperlipidemia) 05/29/2016   Hyperlipemia     Hypertension    Joint pain    Morbid obesity ([place]) 05/29/2016   Prediabetes    Right hip pain    needs hip replacement   Sleep apnea    on CPAP   Past Surgical History:  Procedure Laterality Date   KNEE SURGERY     REPLACEMENT TOTAL HIP W/  RESURFACING IMPLANTS Right 13/0865   Dr. Aleda Grana   Family History  Problem Relation Age of Onset   Lung disease Mother    Hypertension Mother    Lung disease Father    Alcoholism Father    Colon cancer Neg Hx    Colon polyps Neg Hx    Esophageal cancer Neg Hx    Stomach cancer Neg Hx    Rectal cancer Neg Hx    Social History   Socioeconomic History   Marital status: Married    Spouse name: John Morrow   Number of children: 4   Years of education: 81   Highest education level: Occupational hygienist History   Occupation: retired    Comment: Pharmacist, hospital  Tobacco Use   Smoking status: Never   Smokeless tobacco: Never  Vaping Use   Vaping Use: Never used  Substance and Sexual Activity   Alcohol use: Not Currently    Comment: Rarely.   Drug use: No   Sexual activity: Yes    Partners: Female  Other Topics Concern   Not on file  Social History Narrative   Lives with his wife. 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   Social Determinants of Health   Financial Resource Strain: Low Risk    Difficulty of Paying Living Expenses: Not hard at all  Food Insecurity: No Food Insecurity   Worried About Charity fundraiser in the Last Year: Never true   [place] in the Last Year: Never true  Transportation Needs: No Transportation Needs   Lack of Transportation (Medical): No   Lack of Transportation (Non-Medical): No  Physical Activity: Inactive   Days of Exercise per Week: 0 days   Minutes of Exercise per Session: 0 min  Stress: No Stress Concern Present   Feeling of Stress : Not at all  Social Connections: Moderately Integrated   Frequency of Communication with Friends and  Family: More than three times a week   Frequency of Social Gatherings with Friends and Family: Once a week   Attends Religious Services: More than 4 times per year   Active Member of [employer] or Organizations: No   Attends Archivist Meetings: Never   Marital Status: Married    Activities of Daily Living In your present state of health, do you have any difficulty performing the following activities: 09/23/2021  Hearing? N  Vision? N  Difficulty concentrating or making decisions? N  Walking or climbing stairs? Y  Comment has some difficulty going up the stairs due to arthritis in the knee.  Dressing or bathing? N  Doing errands, shopping? N  Preparing Food and eating ?  N  Using the Toilet? N  In the past six months, have you accidently leaked urine? N  Do you have problems with loss of bowel control? N  Managing your Medications? N  Managing your Finances? N  Housekeeping or managing your Housekeeping? N  Some recent data might be hidden    Patient Education/ Literacy How often do you need to have someone help you when you read instructions, pamphlets, or other written materials from your doctor or pharmacy?: 1 - Never What is the last grade level you completed in school?: PhD  Exercise Current Exercise Habits: Home exercise routine, Type of exercise: walking, Time (Minutes): 30, Frequency (Times/Week): 3, Weekly Exercise (Minutes/Week): 90, Intensity: Mild, Exercise limited by: None identified  Diet Patient reports consuming  2-3  meals a day and 2 snack(s) a day Patient reports that his primary diet is: Regular Patient reports that she does have regular access to food.   Depression Screen PHQ 2/9 Scores 09/23/2021 02/19/2021 09/07/2019 08/16/2019 09/23/2018 09/06/2018 01/11/2018  PHQ - 2 Score 0 0 0 0 4 0 0  PHQ- 9 Score - - - - 8 - -     Fall Risk Fall Risk  09/23/2021 02/19/2021 09/07/2019 09/06/2018 01/11/2018  Falls in the past year? 0 0 0 0 No  Number falls in past yr: 0  0 0 - -  Injury with Fall? 0 0 0 - -  Risk for fall due to : No Fall Risks No Fall Risks - Impaired mobility -  Follow up Falls evaluation completed Falls evaluation completed Falls prevention discussed - -     Objective:  John Morrow seemed alert and oriented and he participated appropriately during our telephone visit.  Blood Pressure Weight BMI  BP Readings from Last 3 Encounters:  08/21/21 130/82  07/03/21 (!) 144/84  05/24/21 126/70   Wt Readings from Last 3 Encounters:  08/21/21 290 lb (131.5 kg)  05/24/21 270 lb (122.5 kg)  05/10/21 270 lb (122.5 kg)   BMI Readings from Last 1 Encounters:  08/21/21 42.83 kg/m    *Unable to obtain current vital signs, weight, and BMI due to telephone visit type  Hearing/Vision  Ammiel did not seem to have difficulty with hearing/understanding during the telephone conversation Reports that he has had a formal eye exam by an eye care professional within the past year Reports that he has not had a formal hearing evaluation within the past year *Unable to fully assess hearing and vision during telephone visit type  Cognitive Function: 6CIT Screen 09/23/2021 09/07/2019 09/06/2018 06/17/2017 06/10/2016  What Year? 0 points 0 points 0 points 0 points 0 points  What month? 0 points 0 points 0 points 0 points 0 points  What time? 0 points 0 points 0 points 0 points 0 points  Count back from 20 0 points 0 points 0 points 0 points 0 points  Months in reverse 0 points 0 points 0 points 0 points 0 points  Repeat phrase 0 points 0 points 0 points 2 points 2 points  Total Score 0 0 0 2 2   (Normal:0-7, Significant for Dysfunction: >8)  Normal Cognitive Function Screening: Yes   Immunization & Health Maintenance Record Immunization History  Administered Date(s) Administered   Fluad Quad(high Dose 65+) 07/03/2021   Influenza, High Dose Seasonal PF 05/05/2017, 05/18/2019, 06/15/2020   Influenza,inj,Quad PF,6+ Mos 05/29/2016, 05/27/2018    Influenza-Unspecified 06/20/2020   PFIZER(Purple Top)SARS-COV-2 Vaccination 12/26/2019, 01/16/2020   Pneumococcal Conjugate-13 06/10/2016   Pneumococcal Polysaccharide-23  06/17/2017   Tdap 05/29/2016   Zoster Recombinat (Shingrix) 11/29/2016, 01/30/2017, 06/28/2020   Zoster, Live 01/17/2012    Health Maintenance  Topic Date Due   COVID-19 Vaccine (3 - Booster for Pfizer series) 10/09/2021 (Originally 03/12/2020)   COLONOSCOPY (Pts 45-60yrs Insurance coverage will need to be confirmed)  05/24/2022   TETANUS/TDAP  05/29/2026   Pneumonia Vaccine 31+ Years old  Completed   INFLUENZA VACCINE  Completed   Hepatitis C Screening  Completed   Zoster Vaccines- Shingrix  Completed   HPV VACCINES  Aged Out       Assessment  This is a routine wellness examination for [employer].  Health Maintenance: Due or Overdue There are no preventive care reminders to display for this patient.   John Morrow does not need a referral for Community Assistance: Care Management:   no Social Work:    no Prescription Assistance:  no Nutrition/Diabetes Education:  no   Plan:  Personalized Goals  Goals Addressed               This Visit's Progress     Patient Stated (pt-stated)        States that he would like to loose about 25 lbs.        Personalized Health Maintenance & Screening Recommendations  Colorectal cancer screening - due in September, 2023  Lung Cancer Screening Recommended: no (Low Dose CT Chest recommended if Age 46-80 years, 30 pack-year currently smoking OR have quit w/in past 15 years) Hepatitis C Screening recommended: no HIV Screening recommended: no  Advanced Directives: Written information was not prepared per patient's request.  Referrals & Orders No orders of the defined types were placed in this encounter.   Follow-up Plan Follow-up with Luetta Nutting, DO as planned Due for a colorectal cancer screening in September, 2023. Medicare wellness visit in one  year. Patient will access AVS on my chart.   I have personally reviewed and noted the following in the patients chart:   Medical and social history Use of alcohol, tobacco or illicit drugs  Current medications and supplements Functional ability and status Nutritional status Physical activity Advanced directives List of other physicians Hospitalizations, surgeries, and ER visits in previous 12 months Vitals Screenings to include cognitive, depression, and falls Referrals and appointments  In addition, I have reviewed and discussed with John Morrow certain preventive protocols, quality metrics, and best practice recommendations. A written personalized care plan for preventive services as well as general preventive health recommendations is available and can be mailed to the patient at his request.      Tinnie Gens, RN  09/23/2021

## 2021-09-23 NOTE — Patient Instructions (Addendum)
John Morrow and Written Plan of Care  John Morrow ,  Thank you for allowing me to perform your Medicare Annual Wellness Visit and for your ongoing commitment to your health.   Health Maintenance & Immunization History Health Maintenance  Topic Date Due   COVID-19 Vaccine (3 - Booster for [place] series) 10/09/2021 (Originally 03/12/2020)   COLONOSCOPY (Pts 45-33yrs Insurance coverage will need to be confirmed)  05/24/2022   TETANUS/TDAP  05/29/2026   Pneumonia Vaccine 31+ Years old  Completed   INFLUENZA VACCINE  Completed   Hepatitis C Screening  Completed   Zoster Vaccines- Shingrix  Completed   HPV VACCINES  Aged Out   Immunization History  Administered Date(s) Administered   Fluad Quad(high Dose 65+) 07/03/2021   Influenza, High Dose Seasonal PF 05/05/2017, 05/18/2019, 06/15/2020   Influenza,inj,Quad PF,6+ Mos 05/29/2016, 05/27/2018   Influenza-Unspecified 06/20/2020   PFIZER(Purple Top)SARS-COV-2 Vaccination 12/26/2019, 01/16/2020   Pneumococcal Conjugate-13 06/10/2016   Pneumococcal Polysaccharide-23 06/17/2017   Tdap 05/29/2016   Zoster Recombinat (Shingrix) 11/29/2016, 01/30/2017, 06/28/2020   Zoster, Live 01/17/2012    These are the patient goals that we discussed:  Goals Addressed               This Visit's Progress     Patient Stated (pt-stated)        States that he would like to loose about 25 lbs.          This is a list of Health Maintenance Items that are overdue or due now: Colorectal cancer screening - due in September, 2023  Orders/Referrals Placed Today: No orders of the defined types were placed in this encounter.  (Contact our referral department at [phone] if you have not spoken with someone about your referral appointment within the next 5 days)    Follow-up Plan Follow-up with Luetta Nutting, DO as planned Due for a colorectal cancer screening in September, 2023. Medicare wellness  visit in one year. Patient will access AVS on my chart.      Health Maintenance, Male Adopting a healthy lifestyle and getting preventive care are important in promoting health and wellness. Ask your health care provider about: The right schedule for you to have regular tests and exams. Things you can do on your own to prevent diseases and keep yourself healthy. What should I know about diet, weight, and exercise? Eat a healthy diet  Eat a diet that includes plenty of vegetables, fruits, low-fat dairy products, and lean protein. Do not eat a lot of foods that are high in solid fats, added sugars, or sodium. Maintain a healthy weight Body mass index (BMI) is a measurement that can be used to identify possible weight problems. It estimates body fat based on height and weight. Your health care provider can help determine your BMI and help you achieve or maintain a healthy weight. Get regular exercise Get regular exercise. This is one of the most important things you can do for your health. Most adults should: Exercise for at least 150 minutes each week. The exercise should increase your heart rate and make you sweat (moderate-intensity exercise). Do strengthening exercises at least twice a week. This is in addition to the moderate-intensity exercise. Spend less time sitting. Even light physical activity can be beneficial. Watch cholesterol and blood lipids Have your blood tested for lipids and cholesterol at 72 years of age, then have this test every 5 years. You may need to have your cholesterol levels checked more  often if: Your lipid or cholesterol levels are high. You are older than 72 years of age. You are at high risk for heart disease. What should I know about cancer screening? Many types of cancers can be detected early and may often be prevented. Depending on your health history and family history, you may need to have cancer screening at various ages. This may include screening  for: Colorectal cancer. Prostate cancer. Skin cancer. Lung cancer. What should I know about heart disease, diabetes, and high blood pressure? Blood pressure and heart disease High blood pressure causes heart disease and increases the risk of stroke. This is more likely to develop in people who have high blood pressure readings or are overweight. Talk with your health care provider about your target blood pressure readings. Have your blood pressure checked: Every 3-5 years if you are 30-70 years of age. Every year if you are 87 years old or older. If you are between the ages of 73 and 53 and are a current or former smoker, ask your health care provider if you should have a one-time screening for abdominal aortic aneurysm (AAA). Diabetes Have regular diabetes screenings. This checks your fasting blood sugar level. Have the screening done: Once every three years after age 40 if you are at a normal weight and have a low risk for diabetes. More often and at a younger age if you are overweight or have a high risk for diabetes. What should I know about preventing infection? Hepatitis B If you have a higher risk for hepatitis B, you should be screened for this virus. Talk with your health care provider to find out if you are at risk for hepatitis B infection. Hepatitis C Blood testing is recommended for: Everyone born from [birth year] through 1965. Anyone with known risk factors for hepatitis C. Sexually transmitted infections (STIs) You should be screened each year for STIs, including gonorrhea and chlamydia, if: You are sexually active and are younger than 72 years of age. You are older than 72 years of age and your health care provider tells you that you are at risk for this type of infection. Your sexual activity has changed since you were last screened, and you are at increased risk for chlamydia or gonorrhea. Ask your health care provider if you are at risk. Ask your health care provider about  whether you are at high risk for HIV. Your health care provider may recommend a prescription medicine to help prevent HIV infection. If you choose to take medicine to prevent HIV, you should first get tested for HIV. You should then be tested every 3 months for as long as you are taking the medicine. Follow these instructions at home: Alcohol use Do not drink alcohol if your health care provider tells you not to drink. If you drink alcohol: Limit how much you have to 0-2 drinks a day. Know how much alcohol is in your drink. In the [place] one drink equals one 12 oz bottle of beer (355 mL), one 5 oz glass of wine (148 mL), or one 1 oz glass of hard liquor (44 mL). Lifestyle Do not use any products that contain nicotine or tobacco. These products include cigarettes, chewing tobacco, and vaping devices, such as e-cigarettes. If you need help quitting, ask your health care provider. Do not use street drugs. Do not share needles. Ask your health care provider for help if you need support or information about quitting drugs. General instructions Schedule regular health, dental, and eye  exams. Stay current with your vaccines. Tell your health care provider if: You often feel depressed. You have ever been abused or do not feel safe at home. [place] Adopting a healthy lifestyle and getting preventive care are important in promoting health and wellness. Follow your health care provider's instructions about healthy diet, exercising, and getting tested or screened for diseases. Follow your health care provider's instructions on monitoring your cholesterol and blood pressure. This information is not intended to replace advice given to you by your health care provider. Make sure you discuss any questions you have with your health care provider. Document Revised: 01/21/2021 Document Reviewed: 01/21/2021 Elsevier Patient Education  [place].

## 2021-09-25 ENCOUNTER — Other Ambulatory Visit: Payer: Self-pay | Admitting: Sports Medicine

## 2021-09-25 DIAGNOSIS — M1712 Unilateral primary osteoarthritis, left knee: Secondary | ICD-10-CM

## 2021-10-06 ENCOUNTER — Other Ambulatory Visit: Payer: Self-pay | Admitting: Sports Medicine

## 2021-10-06 DIAGNOSIS — M47816 Spondylosis without myelopathy or radiculopathy, lumbar region: Secondary | ICD-10-CM

## 2021-12-15 ENCOUNTER — Other Ambulatory Visit: Payer: Self-pay | Admitting: Sports Medicine

## 2021-12-15 DIAGNOSIS — M1712 Unilateral primary osteoarthritis, left knee: Secondary | ICD-10-CM

## 2021-12-24 ENCOUNTER — Encounter: Payer: Self-pay | Admitting: Family Medicine

## 2021-12-24 ENCOUNTER — Ambulatory Visit: Payer: Medicare PPO | Admitting: Family Medicine

## 2021-12-24 VITALS — BP 127/84 | HR 91 | Ht 69.0 in | Wt 294.0 lb

## 2021-12-24 DIAGNOSIS — R7303 Prediabetes: Secondary | ICD-10-CM | POA: Diagnosis not present

## 2021-12-24 DIAGNOSIS — M109 Gout, unspecified: Secondary | ICD-10-CM

## 2021-12-24 DIAGNOSIS — M47816 Spondylosis without myelopathy or radiculopathy, lumbar region: Secondary | ICD-10-CM

## 2021-12-24 DIAGNOSIS — M1712 Unilateral primary osteoarthritis, left knee: Secondary | ICD-10-CM

## 2021-12-24 LAB — POCT GLYCOSYLATED HEMOGLOBIN (HGB A1C): HbA1c, POC (prediabetic range): 5.2 % — AB (ref 5.7–6.4)

## 2021-12-24 MED ORDER — TRAMADOL HCL 50 MG PO TABS: ORAL_TABLET | ORAL | 1 refills | Status: DC

## 2021-12-24 MED ORDER — PREDNISONE 20 MG PO TABS: 20.0000 mg | ORAL_TABLET | Freq: Two times a day (BID) | ORAL | 0 refills | Status: AC

## 2021-12-24 MED ORDER — MELOXICAM 15 MG PO TABS: ORAL_TABLET | ORAL | 3 refills | Status: DC

## 2021-12-24 MED ORDER — OZEMPIC (0.25 OR 0.5 MG/DOSE) 2 MG/1.5ML ~~LOC~~ SOPN: PEN_INJECTOR | SUBCUTANEOUS | 0 refills | Status: DC

## 2021-12-24 NOTE — Assessment & Plan Note (Signed)
Exam consistent with gout flare.  Adding course of prednisone.  He will let me know if not improving with this. ?

## 2021-12-24 NOTE — Patient Instructions (Addendum)
Start prednisone '20mg'$  twice per day for toe pain.  ?Meloxicam and Tramadol renewed.  ?Let's see if we can get Ozempic approved to help with weight.   ? ?I think you can try stopping gabapentin and bupropion.  ?

## 2021-12-24 NOTE — Assessment & Plan Note (Signed)
Tramadol meloxicam renewed.  Still needs to reduce his BMI to be eligible for surgery.  Encouraged to work on activity level and dietary change. ?

## 2021-12-24 NOTE — Assessment & Plan Note (Signed)
He has history of prediabetes and obesity.  Weight loss recommended I think he would benefit from addition of GLP-1 medications to help with weight management and glucose management.  Starting Ozempic at 0.25 mg with titration after 4 weeks. ?

## 2021-12-24 NOTE — Progress Notes (Signed)
?John Morrow - 71 y.o. male MRN [id number]  Date of birth: [birth year] ? ?Subjective ?Chief Complaint  ?Patient presents with  ? Foot Pain  ? ? ?HPI ?John Morrow is a 71 year old male here today for follow-up visit.  He has complaint of continued back and knee pain.  He has been evaluated to have arthroplasty of the knees however was told that his BMI is too high.  Was told that his BMI needs to be below 40.  He has tried to lose weight but has not been diligent with diet and activity.  He did have history of prediabetes and would be interested in trying GLP-1 to help with weight loss.  He has been using tramadol and meloxicam to help control pain but needs renewals of these. ? ?Additionally he is having pain along the MTP joint of the right great toe.  No history of gout but joint is red and inflamed.  This started a few days ago. ? ?ROS:  A comprehensive ROS was completed and negative except as noted per HPI ? ?Allergies  ?Allergen Reactions  ? Morphine And Related   ?  Rash, vomiting, itching ?  ? ? ?Past Medical History:  ?Diagnosis Date  ? Arthritis   ? Back pain   ? Diverticulosis of colon without hemorrhage 06/12/2016  ? On Colonoscopy 11/06/2010  ? HLD (hyperlipidemia) 05/29/2016  ? Hyperlipemia   ? Hypertension   ? Joint pain   ? Morbid obesity ([place]) 05/29/2016  ? Prediabetes   ? Right hip pain   ? needs hip replacement  ? Sleep apnea   ? on CPAP  ? ? ?Past Surgical History:  ?Procedure Laterality Date  ? KNEE SURGERY    ? REPLACEMENT TOTAL HIP W/  RESURFACING IMPLANTS Right 03/2019  ? Dr. Aleda Grana  ? ? ?Social History  ? ?Socioeconomic History  ? Marital status: Married  ?  Spouse name: [name]  ? Number of children: 4  ? Years of education: 70  ? Highest education level: Doctorate  ?Occupational History  ? 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 Smoking status: Never  ? Smokeless tobacco: Never  ?Vaping Use  ? Vaping Use: Never used  ?Substance and Sexual Activity  ? Alcohol use: Not Currently  ?   Comment: Rarely.  ? Drug use: No  ? Sexual activity: Yes  ?  Partners: Female  ?Other Topics Concern  ? Not on file  ?Social History Narrative  ? Lives with his wife. 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  ? ?Social Determinants of Health  ? ?Financial Resource Strain: Low Risk   ? Difficulty of Paying Living Expenses: Not hard at all  ?Food Insecurity: No Food Insecurity  ? Worried About Charity fundraiser in the Last Year: Never true  ? Ran Out of Food in the Last Year: Never true  ?Transportation Needs: No Transportation Needs  ? Lack of Transportation (Medical): No  ? Lack of Transportation (Non-Medical): No  ?Physical Activity: Inactive  ? Days of Exercise per Week: 0 days  ? Minutes of Exercise per Session: 0 min  ?Stress: No Stress Concern Present  ? Feeling of Stress : Not at all  ?Social Connections: Moderately Integrated  ? Frequency of Communication with Friends and Family: More than three times a week  ? Frequency of Social Gatherings with Friends and Family: Once a week  ? Attends Religious Services: More than 4 times  per year  ? Active Member of Clubs or Organizations: No  ? Attends Archivist Meetings: Never  ? Marital Status: Married  ? ? ?Family History  ?Problem Relation Age of Onset  ? Lung disease Mother   ? Hypertension Mother   ? Lung disease Father   ? Alcoholism Father   ? Colon cancer Neg Hx   ? Colon polyps Neg Hx   ? Esophageal cancer Neg Hx   ? Stomach cancer Neg Hx   ? Rectal cancer Neg Hx   ? ? ?Health Maintenance  ?Topic Date Due  ? COVID-19 Vaccine (3 - Booster for Pfizer series) 05/16/2022 (Originally 03/12/2020)  ? INFLUENZA VACCINE  04/15/2022  ? COLONOSCOPY (Pts 45-36yr Insurance coverage will need to be confirmed)  05/24/2022  ? TETANUS/TDAP  05/29/2026  ? Pneumonia Vaccine 72 Years old  Completed  ? Hepatitis C Screening  Completed  ? Zoster Vaccines- Shingrix  Completed  ? HPV VACCINES  Aged Out   ? ? ? ?----------------------------------------------------------------------------------------------------------------------------------------------------------------------------------------------------------------- ?Physical Exam ?BP 127/84 (BP Location: Left Arm, Patient Position: Sitting, Cuff Size: Large)   Pulse 91   Ht '5\' 9"'$  (1.753 m)   Wt 294 lb (133.4 kg)   SpO2 95%   BMI 43.42 kg/m?  ? ?Physical Exam ?Constitutional:   ?   Appearance: Normal appearance.  ?Eyes:  ?   General: No scleral icterus. ?Cardiovascular:  ?   Rate and Rhythm: Normal rate and regular rhythm.  ?Pulmonary:  ?   Effort: Pulmonary effort is normal.  ?   Breath sounds: Normal breath sounds.  ?Musculoskeletal:  ?   Cervical back: Neck supple.  ?   Comments: Erythema at MTP joint with tenderness to palpation.  ?Neurological:  ?   Mental Status: He is alert.  ? ? ?------------------------------------------------------------------------------------------------------------------------------------------------------------------------------------------------------------------- ?Assessment and Plan ? ?Gout ?Exam consistent with gout flare.  Adding course of prednisone.  He will let me know if not improving with this. ? ?Primary osteoarthritis of left knee ?Tramadol meloxicam renewed.  Still needs to reduce his BMI to be eligible for surgery.  Encouraged to work on activity level and dietary change. ? ?Prediabetes ?He has history of prediabetes and obesity.  Weight loss recommended I think he would benefit from addition of GLP-1 medications to help with weight management and glucose management.  Starting Ozempic at 0.25 mg with titration after 4 weeks. ? ? ?Meds ordered this encounter  ?Medications  ? predniSONE (DELTASONE) 20 MG tablet  ?  Sig: Take 1 tablet (20 mg total) by mouth 2 (two) times daily with a meal for 7 days.  ?  Dispense:  14 tablet  ?  Refill:  0  ? traMADol (ULTRAM) 50 MG tablet  ?  Sig: TAKE 1 TO 2 TABLETS BY MOUTH EVERY 8  HOURS AS NEEDED FOR MODERATE PAIN . DO NOT EXCEED 6 PER 24 HOURS  ?  Dispense:  60 tablet  ?  Refill:  1  ? meloxicam (MOBIC) 15 MG tablet  ?  Sig: One tab PO qAM with a meal for 2 weeks, then daily prn pain.  ?  Dispense:  30 tablet  ?  Refill:  3  ? ? ?No follow-ups on file. ? ? ? ?This visit occurred during the SARS-CoV-2 public health emergency.  Safety protocols were in place, including screening questions prior to the visit, additional usage of staff PPE, and extensive cleaning of exam room while observing appropriate contact time as indicated for disinfecting solutions.  ? ?

## 2022-01-15 DIAGNOSIS — H50332 Intermittent monocular exotropia, left eye: Secondary | ICD-10-CM | POA: Diagnosis not present

## 2022-01-15 DIAGNOSIS — H2513 Age-related nuclear cataract, bilateral: Secondary | ICD-10-CM | POA: Diagnosis not present

## 2022-01-15 DIAGNOSIS — H43393 Other vitreous opacities, bilateral: Secondary | ICD-10-CM | POA: Diagnosis not present

## 2022-01-15 DIAGNOSIS — H1045 Other chronic allergic conjunctivitis: Secondary | ICD-10-CM | POA: Diagnosis not present

## 2022-01-15 DIAGNOSIS — H524 Presbyopia: Secondary | ICD-10-CM | POA: Diagnosis not present

## 2022-02-05 ENCOUNTER — Encounter: Payer: Self-pay | Admitting: Family Medicine

## 2022-02-07 ENCOUNTER — Other Ambulatory Visit: Payer: Self-pay | Admitting: Family Medicine

## 2022-02-07 DIAGNOSIS — R7303 Prediabetes: Secondary | ICD-10-CM

## 2022-02-11 DIAGNOSIS — C4442 Squamous cell carcinoma of skin of scalp and neck: Secondary | ICD-10-CM | POA: Diagnosis not present

## 2022-02-11 DIAGNOSIS — L570 Actinic keratosis: Secondary | ICD-10-CM | POA: Diagnosis not present

## 2022-02-19 ENCOUNTER — Encounter: Payer: Self-pay | Admitting: Family Medicine

## 2022-02-19 ENCOUNTER — Ambulatory Visit (INDEPENDENT_AMBULATORY_CARE_PROVIDER_SITE_OTHER): Payer: Medicare PPO | Admitting: Family Medicine

## 2022-02-19 DIAGNOSIS — G4733 Obstructive sleep apnea (adult) (pediatric): Secondary | ICD-10-CM

## 2022-02-19 NOTE — Patient Instructions (Signed)
We are going to contact the sleep center at [facility] and request your records.  We'll be in touch once we get these back.

## 2022-02-19 NOTE — Assessment & Plan Note (Signed)
Reports that CPAP is reaching the end of it's lifespan. We do not have his last sleep study on file.  Records requested.  Discussed that we may need to repeat his sleep study depending on age of this.

## 2022-02-19 NOTE — Progress Notes (Signed)
John Morrow - 71 y.o. male MRN [id number]  Date of birth: 09-26-1950  Subjective Chief Complaint  Patient presents with   Obstructive Sleep Apnea    HPI John Morrow is a 71 y.o. male here today for follow up of OSA.  He reports that his machine is displaying a message that it is reaching the end of it's lifespan.  Currently working ok but is concerned that it may stop working.  He is using daily and finds benefit from it use.  His last sleep study was about 8-10 years ago in [place], [place].    He does also express some concerns about memory.  He has been a little more forgetful recently.  Doesn't want to do anything about this right now but just wants to keep an eye on this.  There is no family history of alzheimers.    ROS:  A comprehensive ROS was completed and negative except as noted per HPI  Allergies  Allergen Reactions   Morphine And Related     Rash, vomiting, itching     Past Medical History:  Diagnosis Date   Arthritis    Back pain    Diverticulosis of colon without hemorrhage 06/12/2016   On Colonoscopy 11/06/2010   HLD (hyperlipidemia) 05/29/2016   Hyperlipemia    Hypertension    Joint pain    Morbid obesity ([place]) 05/29/2016   Prediabetes    Right hip pain    needs hip replacement   Sleep apnea    on CPAP    Past Surgical History:  Procedure Laterality Date   KNEE SURGERY     REPLACEMENT TOTAL HIP W/  RESURFACING IMPLANTS Right 89/3810   Dr. Aleda Grana    Social History   Socioeconomic History   Marital status: Married    Spouse name: [name]   Number of children: 4   Years of education: 18   Highest education level: Occupational hygienist History   Occupation: retired    Comment: Pharmacist, hospital  Tobacco Use   Smoking status: Never   Smokeless tobacco: Never  Vaping Use   Vaping Use: Never used  Substance and Sexual Activity   Alcohol use: Not Currently    Comment: Rarely.   Drug use: No   Sexual activity: Yes    Partners: Female  Other Topics  Concern   Not on file  Social History Narrative   Lives with his wife. 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   Social Determinants of Health   Financial Resource Strain: Low Risk    Difficulty of Paying Living Expenses: Not hard at all  Food Insecurity: No Food Insecurity   Worried About Charity fundraiser in the Last Year: Never true   [place] in the Last Year: Never true  Transportation Needs: No Transportation Needs   Lack of Transportation (Medical): No   Lack of Transportation (Non-Medical): No  Physical Activity: Inactive   Days of Exercise per Week: 0 days   Minutes of Exercise per Session: 0 min  Stress: No Stress Concern Present   Feeling of Stress : Not at all  Social Connections: Moderately Integrated   Frequency of Communication with Friends and Family: More than three times a week   Frequency of Social Gatherings with Friends and Family: Once a week   Attends Religious Services: More than 4 times per year   Active Member of [employer] or Organizations: No   Attends Archivist  Meetings: Never   Marital Status: Married    Family History  Problem Relation Age of Onset   Lung disease Mother    Hypertension Mother    Lung disease Father    Alcoholism Father    Colon cancer Neg Hx    Colon polyps Neg Hx    Esophageal cancer Neg Hx    Stomach cancer Neg Hx    Rectal cancer Neg Hx     Health Maintenance  Topic Date Due   COVID-19 Vaccine (3 - Booster for Pfizer series) 05/16/2022 (Originally 03/12/2020)   INFLUENZA VACCINE  04/15/2022   COLONOSCOPY (Pts 45-26yr Insurance coverage will need to be confirmed)  05/24/2022   TETANUS/TDAP  05/29/2026   Pneumonia Vaccine 71 Years old  Completed   Hepatitis C Screening  Completed   Zoster Vaccines- Shingrix  Completed   HPV VACCINES  Aged Out      ----------------------------------------------------------------------------------------------------------------------------------------------------------------------------------------------------------------- Physical Exam BP 137/89 (BP Location: Left Arm, Patient Position: Sitting, Cuff Size: Large)   Pulse 73   Ht '5\' 9"'$  (1.753 m)   Wt 292 lb (132.5 kg)   SpO2 95%   BMI 43.12 kg/m   Physical Exam Constitutional:      Appearance: Normal appearance.  Neurological:     Mental Status: He is alert.  Psychiatric:        Mood and Affect: Mood normal.        Behavior: Behavior normal.    ------------------------------------------------------------------------------------------------------------------------------------------------------------------------------------------------------------------- Assessment and Plan  OSA (obstructive sleep apnea) Reports that CPAP is reaching the end of it's lifespan. We do not have his last sleep study on file.  Records requested.  Discussed that we may need to repeat his sleep study depending on age of this.     No orders of the defined types were placed in this encounter.   No follow-ups on file.    This visit occurred during the SARS-CoV-2 public health emergency.  Safety protocols were in place, including screening questions prior to the visit, additional usage of staff PPE, and extensive cleaning of exam room while observing appropriate contact time as indicated for disinfecting solutions.

## 2022-02-26 ENCOUNTER — Other Ambulatory Visit: Payer: Self-pay | Admitting: Family Medicine

## 2022-02-26 DIAGNOSIS — R7303 Prediabetes: Secondary | ICD-10-CM

## 2022-03-03 DIAGNOSIS — C4442 Squamous cell carcinoma of skin of scalp and neck: Secondary | ICD-10-CM | POA: Diagnosis not present

## 2022-03-20 ENCOUNTER — Encounter: Payer: Self-pay | Admitting: Family Medicine

## 2022-03-20 DIAGNOSIS — G4733 Obstructive sleep apnea (adult) (pediatric): Secondary | ICD-10-CM

## 2022-03-25 DIAGNOSIS — G4733 Obstructive sleep apnea (adult) (pediatric): Secondary | ICD-10-CM | POA: Diagnosis not present

## 2022-03-25 DIAGNOSIS — Z885 Allergy status to narcotic agent status: Secondary | ICD-10-CM | POA: Diagnosis not present

## 2022-03-25 DIAGNOSIS — N400 Enlarged prostate without lower urinary tract symptoms: Secondary | ICD-10-CM | POA: Diagnosis not present

## 2022-03-25 DIAGNOSIS — M1990 Unspecified osteoarthritis, unspecified site: Secondary | ICD-10-CM | POA: Diagnosis not present

## 2022-03-25 DIAGNOSIS — E669 Obesity, unspecified: Secondary | ICD-10-CM | POA: Diagnosis not present

## 2022-03-25 DIAGNOSIS — Z6841 Body Mass Index (BMI) 40.0 and over, adult: Secondary | ICD-10-CM | POA: Diagnosis not present

## 2022-03-25 DIAGNOSIS — I10 Essential (primary) hypertension: Secondary | ICD-10-CM | POA: Diagnosis not present

## 2022-03-25 DIAGNOSIS — E785 Hyperlipidemia, unspecified: Secondary | ICD-10-CM | POA: Diagnosis not present

## 2022-03-25 DIAGNOSIS — M461 Sacroiliitis, not elsewhere classified: Secondary | ICD-10-CM | POA: Diagnosis not present

## 2022-03-25 DIAGNOSIS — Z96649 Presence of unspecified artificial hip joint: Secondary | ICD-10-CM | POA: Diagnosis not present

## 2022-03-29 ENCOUNTER — Emergency Department (HOSPITAL_BASED_OUTPATIENT_CLINIC_OR_DEPARTMENT_OTHER)
Admission: EM | Admit: 2022-03-29 | Discharge: 2022-03-29 | Disposition: A | Discharge status: Home / Self Care | Payer: Medicare PPO | Attending: Emergency Medicine | Admitting: Emergency Medicine

## 2022-03-29 ENCOUNTER — Emergency Department (HOSPITAL_BASED_OUTPATIENT_CLINIC_OR_DEPARTMENT_OTHER): Payer: Medicare PPO

## 2022-03-29 ENCOUNTER — Encounter (HOSPITAL_BASED_OUTPATIENT_CLINIC_OR_DEPARTMENT_OTHER): Payer: Self-pay | Admitting: Emergency Medicine

## 2022-03-29 ENCOUNTER — Emergency Department
Admission: EM | Admit: 2022-03-29 | Discharge: 2022-03-29 | Disposition: A | Discharge status: Home / Self Care | Payer: Medicare PPO

## 2022-03-29 ENCOUNTER — Other Ambulatory Visit: Payer: Self-pay

## 2022-03-29 ENCOUNTER — Other Ambulatory Visit: Payer: Self-pay | Admitting: Family Medicine

## 2022-03-29 DIAGNOSIS — R1013 Epigastric pain: Secondary | ICD-10-CM

## 2022-03-29 DIAGNOSIS — R531 Weakness: Secondary | ICD-10-CM | POA: Diagnosis not present

## 2022-03-29 DIAGNOSIS — Z79899 Other long term (current) drug therapy: Secondary | ICD-10-CM | POA: Diagnosis not present

## 2022-03-29 DIAGNOSIS — R1011 Right upper quadrant pain: Secondary | ICD-10-CM

## 2022-03-29 DIAGNOSIS — R7303 Prediabetes: Secondary | ICD-10-CM

## 2022-03-29 DIAGNOSIS — R079 Chest pain, unspecified: Secondary | ICD-10-CM | POA: Diagnosis not present

## 2022-03-29 DIAGNOSIS — E119 Type 2 diabetes mellitus without complications: Secondary | ICD-10-CM | POA: Insufficient documentation

## 2022-03-29 DIAGNOSIS — Z7984 Long term (current) use of oral hypoglycemic drugs: Secondary | ICD-10-CM | POA: Diagnosis not present

## 2022-03-29 DIAGNOSIS — R509 Fever, unspecified: Secondary | ICD-10-CM

## 2022-03-29 DIAGNOSIS — R748 Abnormal levels of other serum enzymes: Secondary | ICD-10-CM | POA: Insufficient documentation

## 2022-03-29 DIAGNOSIS — N2889 Other specified disorders of kidney and ureter: Secondary | ICD-10-CM | POA: Diagnosis not present

## 2022-03-29 DIAGNOSIS — K859 Acute pancreatitis without necrosis or infection, unspecified: Secondary | ICD-10-CM | POA: Diagnosis not present

## 2022-03-29 DIAGNOSIS — D72829 Elevated white blood cell count, unspecified: Secondary | ICD-10-CM | POA: Diagnosis not present

## 2022-03-29 DIAGNOSIS — I10 Essential (primary) hypertension: Secondary | ICD-10-CM | POA: Insufficient documentation

## 2022-03-29 DIAGNOSIS — K5730 Diverticulosis of large intestine without perforation or abscess without bleeding: Secondary | ICD-10-CM | POA: Diagnosis not present

## 2022-03-29 LAB — COMPREHENSIVE METABOLIC PANEL
ALT: 22 U/L (ref 0–44)
AST: 18 U/L (ref 15–41)
Albumin: 3.8 g/dL (ref 3.5–5.0)
Alkaline Phosphatase: 51 U/L (ref 38–126)
Anion gap: 8 (ref 5–15)
BUN: 23 mg/dL (ref 8–23)
CO2: 28 mmol/L (ref 22–32)
Calcium: 9 mg/dL (ref 8.9–10.3)
Chloride: 101 mmol/L (ref 98–111)
Creatinine, Ser: 1.07 mg/dL (ref 0.61–1.24)
GFR, Estimated: >60 mL/min (ref >60)
Glucose, Bld: 148 mg/dL — ABNORMAL HIGH (ref 70–99)
Potassium: 3.4 mmol/L — ABNORMAL LOW (ref 3.5–5.1)
Sodium: 137 mmol/L (ref 135–145)
Total Bilirubin: 1.6 mg/dL — ABNORMAL HIGH (ref 0.3–1.2)
Total Protein: 7.1 g/dL (ref 6.5–8.1)

## 2022-03-29 LAB — CBC
HCT: 43.6 % (ref 39.0–52.0)
Hemoglobin: 15.1 g/dL (ref 13.0–17.0)
MCH: 30.8 pg (ref 26.0–34.0)
MCHC: 34.6 g/dL (ref 30.0–36.0)
MCV: 89 fL (ref 80.0–100.0)
Platelets: 227 10*3/uL (ref 150–400)
RBC: 4.9 MIL/uL (ref 4.22–5.81)
RDW: 11.5 % (ref 11.5–15.5)
WBC: 13.9 10*3/uL — ABNORMAL HIGH (ref 4.0–10.5)
nRBC: 0 % (ref 0.0–0.2)

## 2022-03-29 LAB — LIPASE, BLOOD: Lipase: 132 U/L — ABNORMAL HIGH (ref 11–51)

## 2022-03-29 LAB — LACTIC ACID, PLASMA: Lactic Acid, Venous: 1.3 mmol/L (ref 0.5–1.9)

## 2022-03-29 MED ORDER — ONDANSETRON HCL 4 MG/2ML IJ SOLN
4.0000 mg | Freq: Once | INTRAMUSCULAR | Status: AC
  Administered 2022-03-29: 4 mg via INTRAVENOUS
  Filled 2022-03-29: qty 2

## 2022-03-29 MED ORDER — ONDANSETRON 4 MG PO TBDP: 4.0000 mg | ORAL_TABLET | Freq: Three times a day (TID) | ORAL | 0 refills | Status: DC | PRN

## 2022-03-29 MED ORDER — IBUPROFEN 800 MG PO TABS
800.0000 mg | ORAL_TABLET | Freq: Once | ORAL | Status: AC
  Administered 2022-03-29: 800 mg via ORAL
  Filled 2022-03-29: qty 1

## 2022-03-29 MED ORDER — OMEPRAZOLE 20 MG PO CPDR: 20.0000 mg | DELAYED_RELEASE_CAPSULE | Freq: Every day | ORAL | 0 refills | Status: DC

## 2022-03-29 MED ORDER — OXYCODONE-ACETAMINOPHEN 5-325 MG PO TABS: 1.0000 | ORAL_TABLET | Freq: Three times a day (TID) | ORAL | 0 refills | Status: DC | PRN

## 2022-03-29 MED ORDER — IOHEXOL 300 MG/ML  SOLN
100.0000 mL | Freq: Once | INTRAMUSCULAR | Status: AC | PRN
  Administered 2022-03-29: 100 mL via INTRAVENOUS

## 2022-03-29 MED ORDER — PANTOPRAZOLE SODIUM 40 MG IV SOLR
40.0000 mg | Freq: Once | INTRAVENOUS | Status: AC
  Administered 2022-03-29: 40 mg via INTRAVENOUS
  Filled 2022-03-29: qty 10

## 2022-03-29 MED ORDER — FENTANYL CITRATE PF 50 MCG/ML IJ SOSY
50.0000 ug | PREFILLED_SYRINGE | Freq: Once | INTRAMUSCULAR | Status: AC
  Administered 2022-03-29: 50 ug via INTRAVENOUS
  Filled 2022-03-29: qty 1

## 2022-03-29 MED ORDER — SODIUM CHLORIDE 0.9 % IV BOLUS
1000.0000 mL | Freq: Once | INTRAVENOUS | Status: AC
  Administered 2022-03-29: 1000 mL via INTRAVENOUS

## 2022-03-29 NOTE — ED Provider Notes (Signed)
  Physical Exam  BP 108/82   Pulse 65   Temp 99 F (37.2 C) (Oral)   Resp 20   Ht '5\' 10"'$  (1.778 m)   Wt 127 kg   SpO2 97%   BMI 40.18 kg/m   Physical Exam  Procedures  Procedures  ED Course / MDM   Clinical Course as of 03/29/22 1610  Sat Mar 29, 2022  1435 Chest x-ray does not show any acute infiltrates.  Awaiting radiology reading. [MB]  1511 Patient's care signed out to Dr. Alvino Chapel.  Plan is likely for admission due to patient's symptomatic abdominal pain and weakness, although CT report not back yet. [MB]    Clinical Course User Index [MB] Hayden Rasmussen, MD   Medical Decision Making Amount and/or Complexity of Data Reviewed Labs: ordered. Radiology: ordered.  Risk Prescription drug management.   Patient feeling better after fluids.  Did have initial fever.  Potential pancreatitis with lipase elevated.  Also CT scan showing pancreatitis or potentially duodenitis.  Temperature mildly increased.  Has had likely norovirus previously.  Patient states he feels a lot better after fluids and is eager to go home.  Has tolerated some orals.  Overall benign abdominal exam for me.  Discussed with patient and wife extensively and we feel he can likely be discharged at this time.  Follow-up with GI and has PCP follow-up in a few days.  Does not drink alcohol and no gallstones seen.  Ozempic also has been known to cause pancreatitis. We will start proton pump inhibitor as an outpatient.       Davonna Belling, MD 03/29/22 1610

## 2022-03-29 NOTE — ED Provider Notes (Signed)
[facility] EMERGENCY DEPARTMENT Provider Note   CSN: [id number] Arrival date & time: 03/29/22  1129     History  Chief Complaint  Patient presents with   Abdominal Pain    John Morrow is a 71 y.o. male.  He has a history of hypertension diabetes.  He had nausea vomiting diarrhea about 3 weeks ago, grandkids with similar.  That seems to resolved but has continued to have upper abdominal discomfort poor appetite and generalized weakness.  Intermittent fevers.  Went to urgent care today who referred him here for further evaluation.  Has not noticed any blood in the vomit or diarrhea.  No prior abdominal surgeries.  Has tried nothing for his symptoms.  Rates the abdominal pain is between 8 out of 10 and 10 out of 10.  The history is provided by the patient.  Abdominal Pain Pain location:  Epigastric Pain quality: aching   Pain severity:  Severe Onset quality:  Gradual Duration:  2 weeks Timing:  Intermittent Progression:  Worsening Chronicity:  New Context: sick contacts   Context: not trauma   Relieved by:  None tried Worsened by:  Nothing Ineffective treatments:  None tried Associated symptoms: diarrhea, fatigue, fever, nausea and vomiting   Associated symptoms: no chest pain, no constipation, no cough, no dysuria, no hematemesis, no hematochezia, no hematuria, no shortness of breath and no sore throat   Risk factors: no recent hospitalization        Home Medications Prior to Admission medications   Medication Sig Start Date End Date Taking? Authorizing Provider  OZEMPIC, 0.25 OR 0.5 MG/DOSE, 2 MG/3ML SOPN INJECT 0.5 MG SUBCUTANEOUSLY ONCE A WEEK 02/26/22   Luetta Nutting, DO  atorvastatin (LIPITOR) 20 MG tablet Take 1 tablet (20 mg total) by mouth daily. 07/12/21 10/10/21  Luetta Nutting, DO  lisinopril-hydrochlorothiazide (ZESTORETIC) 20-25 MG tablet Take 1 tablet by mouth daily. 02/19/21   Luetta Nutting, DO  meloxicam (MOBIC) 15 MG tablet One tab PO qAM with a  meal for 2 weeks, then daily prn pain. 12/24/21   Luetta Nutting, DO  tamsulosin (FLOMAX) 0.4 MG CAPS capsule Take 1 capsule (0.4 mg total) by mouth daily. 02/19/21   Luetta Nutting, DO  traMADol (ULTRAM) 50 MG tablet TAKE 1 TO 2 TABLETS BY MOUTH EVERY 8 HOURS AS NEEDED FOR MODERATE PAIN . DO NOT EXCEED 6 PER 24 HOURS 12/24/21   Luetta Nutting, DO      Allergies    Morphine and related    Review of Systems   Review of Systems  Constitutional:  Positive for fatigue and fever.  HENT:  Negative for sore throat.   Eyes:  Negative for visual disturbance.  Respiratory:  Negative for cough and shortness of breath.   Cardiovascular:  Negative for chest pain.  Gastrointestinal:  Positive for abdominal pain, diarrhea, nausea and vomiting. Negative for constipation, hematemesis and hematochezia.  Genitourinary:  Negative for dysuria and hematuria.  Skin:  Negative for rash.    Physical Exam Updated Vital Signs BP (!) 154/85   Pulse 78   Temp (!) 100.6 F (38.1 C) (Oral)   Resp 18   Ht '5\' 10"'$  (1.778 m)   Wt 127 kg   SpO2 96%   BMI 40.18 kg/m  Physical Exam Vitals and nursing note reviewed.  Constitutional:      General: He is not in acute distress.    Appearance: He is well-developed. He is obese.  HENT:     Head: Normocephalic and atraumatic.  Eyes:     Conjunctiva/sclera: Conjunctivae normal.  Cardiovascular:     Rate and Rhythm: Normal rate and regular rhythm.     Heart sounds: No murmur heard. Pulmonary:     Effort: Pulmonary effort is normal. No respiratory distress.     Breath sounds: Normal breath sounds.  Abdominal:     Palpations: Abdomen is soft.     Tenderness: There is no abdominal tenderness. There is no guarding or rebound.  Musculoskeletal:     Cervical back: Neck supple.     Right lower leg: No edema.     Left lower leg: No edema.  Skin:    General: Skin is warm and dry.     Capillary Refill: Capillary refill takes less than 2 seconds.  Neurological:      Mental Status: He is alert.     Sensory: No sensory deficit.     Motor: No weakness.     ED Results / Procedures / Treatments   Labs (all labs ordered are listed, but only abnormal results are displayed) Labs Reviewed  LIPASE, BLOOD - Abnormal; Notable for the following components:      Result Value   Lipase 132 (*)    All other components within normal limits  COMPREHENSIVE METABOLIC PANEL - Abnormal; Notable for the following components:   Potassium 3.4 (*)    Glucose, Bld 148 (*)    Total Bilirubin 1.6 (*)    All other components within normal limits  CBC - Abnormal; Notable for the following components:   WBC 13.9 (*)    All other components within normal limits  LACTIC ACID, PLASMA  URINALYSIS, ROUTINE W REFLEX MICROSCOPIC    EKG EKG Interpretation  Date/Time:  Saturday March 29 2022 14:32:06 EDT Ventricular Rate:  66 PR Interval:  167 QRS Duration: 103 QT Interval:  409 QTC Calculation: 429 R Axis:   35 Text Interpretation: Sinus rhythm Low voltage, precordial leads Baseline wander in lead(s) V3 No old tracing to compare Confirmed by Aletta Edouard [phone]) on 03/29/2022 2:36:13 PM  Radiology CT Abdomen Pelvis W Contrast  Result Date: 03/29/2022 CLINICAL DATA:  Abdominal pain EXAM: CT ABDOMEN AND PELVIS WITH CONTRAST TECHNIQUE: Multidetector CT imaging of the abdomen and pelvis was performed using the standard protocol following bolus administration of intravenous contrast. RADIATION DOSE REDUCTION: This exam was performed according to the departmental dose-optimization program which includes automated exposure control, adjustment of the mA and/or kV according to patient size and/or use of iterative reconstruction technique. CONTRAST:  120m OMNIPAQUE IOHEXOL 300 MG/ML  SOLN COMPARISON:  CT done on 02/13/2020 FINDINGS: Lower chest: Unremarkable. Hepatobiliary: There is no dilation of bile ducts. There is a 5 mm low-density in the left lobe of liver, possibly a cyst or  hemangioma with no significant interval change. Gallbladder is unremarkable. Pancreas: There is stranding in the fat planes around the head of the pancreas and adjacent duodenum. Body and tail of pancreas are unremarkable. There is no evidence of pancreatic necrosis. There are no loculated fluid collections in or around the pancreas. Spleen: Unremarkable. Adrenals/Urinary Tract: Adrenals are unremarkable. There is no hydronephrosis. There are no renal or ureteral stones. There are few smooth marginated lesions in both kidneys largest measuring 3.3 cm in the lower pole of left kidney. Ureters are not dilated. Urinary bladder is not distended. Stomach/Bowel: Stomach is unremarkable. Small bowel loops are not dilated. Appendix is not dilated. There is no significant mild thickening in colon. Multiple diverticula are seen in colon  without signs of focal diverticulitis. Vascular/Lymphatic: Unremarkable. Reproductive: Prostate is enlarged. Evaluation is limited by beam hardening artifacts. Other: There is no ascites or pneumoperitoneum. Umbilical hernia containing fat is seen. Musculoskeletal: There is previous right hip arthroplasty. Degenerative changes are noted in lumbar spine, particularly severe at the L5-S1 level with marked narrowing of disc space and encroachment of neural foramina. There is minimal retrolisthesis at L2-L3 level. IMPRESSION: There is stranding in the fat planes adjacent to the head of the pancreas and duodenum. Findings suggest possible early pancreatitis. Less likely possibility would be acute duodenitis. There is no evidence of pancreatic necrosis. There are no loculated fluid collections in or around the pancreas. There is no dilation of bile ducts. There is no hydronephrosis. There is no evidence of intestinal obstruction or pneumoperitoneum. Diverticulosis of colon without signs of focal diverticulitis. Renal cysts. Other findings as described in the body of the report. Electronically Signed    By: Elmer Picker M.D.   On: 03/29/2022 15:25   DG Chest Port 1 View  Result Date: 03/29/2022 CLINICAL DATA:  Pain right upper quadrant, generalized weakness EXAM: PORTABLE CHEST 1 VIEW COMPARISON:  None Available. FINDINGS: Transverse diameter of heart is slightly increased. There are no signs of pulmonary edema or focal pulmonary consolidation. Small linear densities are seen in right lower lung field. There is no pleural effusion or pneumothorax. Degenerative changes are noted in the right shoulder. IMPRESSION: There are no signs of pulmonary edema or focal pulmonary consolidation. Small linear densities in right lower lung field may suggest scarring or subsegmental atelectasis. Electronically Signed   By: Elmer Picker M.D.   On: 03/29/2022 14:27    Procedures Procedures    Medications Ordered in ED Medications  ondansetron (ZOFRAN) injection 4 mg (has no administration in time range)  fentaNYL (SUBLIMAZE) injection 50 mcg (has no administration in time range)  sodium chloride 0.9 % bolus 1,000 mL (has no administration in time range)  pantoprazole (PROTONIX) injection 40 mg (has no administration in time range)  ibuprofen (ADVIL) tablet 800 mg (800 mg Oral Given 03/29/22 1213)    ED Course/ Medical Decision Making/ A&P Clinical Course as of 03/29/22 1818  Sat Mar 29, 2022  1435 Chest x-ray does not show any acute infiltrates.  Awaiting radiology reading. [MB]  1511 Patient's care signed out to Dr. Alvino Chapel.  Plan is likely for admission due to patient's symptomatic abdominal pain and weakness, although CT report not back yet. [MB]    Clinical Course User Index [MB] Hayden Rasmussen, MD                           Medical Decision Making Amount and/or Complexity of Data Reviewed Labs: ordered. Radiology: ordered.  Risk Prescription drug management.   This patient complains of abdominal pain intermittent fevers, malaise for general weakness decreased appetite;  this involves an extensive number of treatment Options and is a complaint that carries with it a high risk of complications and morbidity. The differential includes peptic ulcer disease, gastritis, infectious diarrhea, dehydration, pancreatitis, colitis, diverticulitis, dehydration  I ordered, reviewed and interpreted labs, which included IV fluids mildly elevated white count, chemistries with low sodium elevated glucose, LFTs normal other than isolated T. bili, lipase nonspecifically elevated, lactate normal I ordered medication IV fluids pain medication PPI and reviewed PMP when indicated. I ordered imaging studies which included chest x-ray and CT abdomen and pelvis and I independently    visualized  and interpreted imaging which showed possible pancreatic inflammation.  Awaiting final reading Additional history obtained from patient's wife Previous records obtained and reviewed in epic no recent admissions  Cardiac monitoring reviewed, normal sinus rhythm Social determinants considered, no significant barriers Critical Interventions: None  After the interventions stated above, I reevaluated the patient and found patient's pain to be improved after medication Admission and further testing considered, his care is signed out to Dr. Alvino Chapel awaiting final reading of CAT scan.  Disposition per results of testing and response to treatment.         Final Clinical Impression(s) / ED Diagnoses Final diagnoses:  Epigastric pain  Acute pancreatitis without infection or necrosis, unspecified pancreatitis type    Rx / DC Orders ED Discharge Orders          Ordered    oxyCODONE-acetaminophen (PERCOCET/ROXICET) 5-325 MG tablet  Every 8 hours PRN        03/29/22 1604    ondansetron (ZOFRAN-ODT) 4 MG disintegrating tablet  Every 8 hours PRN        03/29/22 1604    omeprazole (PRILOSEC) 20 MG capsule  Daily        03/29/22 1609              Hayden Rasmussen, MD 03/29/22 1820

## 2022-03-29 NOTE — ED Triage Notes (Signed)
Pt arrives pov, slow gait, c/o RUQ pain x 2 weeks. N/v that has resolved. Pt febrile in triage. Referred by UC

## 2022-03-29 NOTE — Discharge Instructions (Addendum)
Follow-up with gastroenterology.  Try and keep yourself hydrated.  Return for worsening symptoms.

## 2022-03-29 NOTE — Discharge Instructions (Addendum)
Instructed/advised patient and wife to go to [facility] ED NOW for further evaluation of right upper quadrant abdominal pain and fever.  Advised this would include CT of abdomen and pelvis with contrast and serological testing.

## 2022-03-29 NOTE — ED Notes (Signed)
Patient is being discharged from the Urgent Care and sent to the Emergency Department via pov . Per M. Ragan, FNP, patient is in need of higher level of care due to r/o acute abdomen w/ scanning and serological testing. Patient is aware and verbalizes understanding of plan of care.  Vitals:   03/29/22 0936  BP: 135/89  Pulse: 87  Resp: 20  Temp: 99.4 F (37.4 C)  SpO2: 94%

## 2022-03-29 NOTE — ED Provider Notes (Signed)
[facility]    CSN: [id number] Arrival date & time: 03/29/22  0920      History   Chief Complaint Chief Complaint  Patient presents with   Abdominal Pain    HPI John Morrow is a 71 y.o. male.   HPI 71 year old male male presents with mid abdominal pain for 15 days.  Reports vomiting and diarrhea at onset approximately 1 week ago.  Reports granddaughter had similar signs and symptoms noticed with norovirus.  Patient reports history of diverticulitis.  PMH significant for morbid obesity, HTN, and sleep apnea.  Patient is accompanied by his wife this morning.  Patient/wife report calling PCP yesterday evaluated for current symptoms and were advised that he could not be seen until next Wednesday, 04/02/22.  Past Medical History:  Diagnosis Date   Arthritis    Back pain    Diverticulosis of colon without hemorrhage 06/12/2016   On Colonoscopy 11/06/2010   HLD (hyperlipidemia) 05/29/2016   Hyperlipemia    Hypertension    Joint pain    Morbid obesity ([place]) 05/29/2016   Prediabetes    Right hip pain    needs hip replacement   Sleep apnea    on CPAP    Patient Active Problem List   Diagnosis Date Noted   OSA (obstructive sleep apnea) 02/19/2022   Gout 12/24/2021   Chronic rhinitis 08/21/2021   Impotence of organic origin 02/13/2021   Lumbar spondylosis 10/09/2020   Pre-operative exam 07/18/2020   Verruca 05/01/2020   Epiploic appendagitis 02/20/2020   Bilateral shoulder region arthritis 04/13/2019   Vitamin D deficiency 02/08/2019   Insulin resistance 02/08/2019   Class 2 severe obesity with serious comorbidity and body mass index (BMI) of 39.0 to 39.9 in adult ([place]) 02/08/2019   Prediabetes 02/08/2019   Chronic pain 09/28/2018   Hip pain 06/28/2018   Dependent edema 05/05/2017   Primary osteoarthritis of left knee 09/25/2016   BPH (benign prostatic hyperplasia) 05/29/2016   Urine frequency 05/29/2016   HTN (hypertension) 05/29/2016   HLD  (hyperlipidemia) 05/29/2016   Class 2 obesity due to excess calories without serious comorbidity with body mass index (BMI) of 39.0 to 39.9 in adult 05/29/2016   Sleep apnea 03/31/2014    Past Surgical History:  Procedure Laterality Date   KNEE SURGERY     REPLACEMENT TOTAL HIP W/  RESURFACING IMPLANTS Right 93/2355   Dr. Aleda Grana       Home Medications    Prior to Admission medications   Medication Sig Start Date End Date Taking? Authorizing Provider  OZEMPIC, 0.25 OR 0.5 MG/DOSE, 2 MG/3ML SOPN INJECT 0.5 MG SUBCUTANEOUSLY ONCE A WEEK 02/26/22   Luetta Nutting, DO  atorvastatin (LIPITOR) 20 MG tablet Take 1 tablet (20 mg total) by mouth daily. 07/12/21 10/10/21  Luetta Nutting, DO  lisinopril-hydrochlorothiazide (ZESTORETIC) 20-25 MG tablet Take 1 tablet by mouth daily. 02/19/21   Luetta Nutting, DO  meloxicam (MOBIC) 15 MG tablet One tab PO qAM with a meal for 2 weeks, then daily prn pain. 12/24/21   Luetta Nutting, DO  tamsulosin (FLOMAX) 0.4 MG CAPS capsule Take 1 capsule (0.4 mg total) by mouth daily. 02/19/21   Luetta Nutting, DO  traMADol (ULTRAM) 50 MG tablet TAKE 1 TO 2 TABLETS BY MOUTH EVERY 8 HOURS AS NEEDED FOR MODERATE PAIN . DO NOT EXCEED 6 PER 24 HOURS 12/24/21   Luetta Nutting, DO    Family History Family History  Problem Relation Age of Onset   Lung disease Mother  Hypertension Mother    Lung disease Father    Alcoholism Father    Colon cancer Neg Hx    Colon polyps Neg Hx    Esophageal cancer Neg Hx    Stomach cancer Neg Hx    Rectal cancer Neg Hx     Social History Social History   Tobacco Use   Smoking status: Never   Smokeless tobacco: Never  Vaping Use   Vaping Use: Never used  Substance Use Topics   Alcohol use: Not Currently    Comment: rarely   Drug use: No     Allergies   Morphine and related   Review of Systems Review of Systems  Constitutional:  Positive for fever.  Gastrointestinal:  Positive for abdominal pain.  All other systems  reviewed and are negative.    Physical Exam Triage Vital Signs ED Triage Vitals  Enc Vitals Group     BP 03/29/22 0936 135/89     Pulse Rate 03/29/22 0936 87     Resp 03/29/22 0936 20     Temp 03/29/22 0936 99.4 F (37.4 C)     Temp Source 03/29/22 0936 Oral     SpO2 03/29/22 0936 94 %     Weight 03/29/22 0932 280 lb (127 kg)     Height 03/29/22 0932 '5\' 10"'$  (1.778 m)     Head Circumference --      Peak Flow --      Pain Score 03/29/22 0932 9     Pain Loc --      Pain Edu? --      Excl. in [place]? --    No data found.  Updated Vital Signs BP 135/89 (BP Location: Right Arm)   Pulse 87   Temp 99.4 F (37.4 C) (Oral)   Resp 20   Ht '5\' 10"'$  (1.778 m)   Wt 280 lb (127 kg)   SpO2 94%   BMI 40.18 kg/m    Physical Exam Vitals and nursing note reviewed.  Constitutional:      General: He is not in acute distress.    Appearance: He is well-developed. He is obese. He is ill-appearing. He is not toxic-appearing or diaphoretic.  HENT:     Head: Normocephalic and atraumatic.  Eyes:     Extraocular Movements: Extraocular movements intact.     Pupils: Pupils are equal, round, and reactive to light.  Cardiovascular:     Rate and Rhythm: Normal rate and regular rhythm.     Heart sounds: Normal heart sounds. No murmur heard. Pulmonary:     Effort: Pulmonary effort is normal.     Breath sounds: Normal breath sounds. No wheezing, rhonchi or rales.  Abdominal:     General: Bowel sounds are absent. There is distension.     Palpations: Abdomen is soft. There is no shifting dullness, fluid wave, hepatomegaly, splenomegaly, mass or pulsatile mass.     Tenderness: There is abdominal tenderness in the right upper quadrant and left upper quadrant. There is no right CVA tenderness, left CVA tenderness, guarding or rebound. Negative signs include Murphy's sign, Rovsing's sign and McBurney's sign.     Hernia: A hernia is present. Hernia is present in the ventral area.  Skin:    General: Skin is  warm and dry.  Neurological:     General: No focal deficit present.     Mental Status: He is alert and oriented to person, place, and time.      UC Treatments / Results  Labs (all labs ordered are listed, but only abnormal results are displayed) Labs Reviewed - No data to display  EKG   Radiology No results found.  Procedures Procedures (including critical care time)  Medications Ordered in UC Medications - No data to display  Initial Impression / Assessment and Plan / UC Course  I have reviewed the triage vital signs and the nursing notes.  Pertinent labs & imaging results that were available during my care of the patient were reviewed by me and considered in my medical decision making (see chart for details).     MDM: 1.  Right upper quadrant abdominal pain-Instructed/advised patient and wife to go to [facility] ED NOW for further evaluation of right upper quadrant abdominal pain and fever.  Advised this would include CT of abdomen and pelvis with contrast and serological testing.  2. Fever-concerning for sepsis, possible abscess of colon/perforation.  Patient/wife agreed and verbalized understanding of these instructions and this plan of care today.  Final Clinical Impressions(s) / UC Diagnoses   Final diagnoses:  Right upper quadrant abdominal pain  Fever, unspecified     Discharge Instructions      Instructed/advised patient and wife to go to [facility] ED NOW for further evaluation of right upper quadrant abdominal pain and fever.  Advised this would include CT of abdomen and pelvis with contrast and serological testing.     ED Prescriptions   None    PDMP not reviewed this encounter.   Eliezer Lofts, [place] 03/29/22 1015

## 2022-03-29 NOTE — ED Triage Notes (Signed)
Pt presents to Urgent Care with c/o mid-abdominal pain since 03/15/22.  Reports vomiting and diarrhea at onset of s/s--last occurrence approx one week ago. Granddaughter had s/s and was dx w/ norovirus. Hx of diverticulitis.

## 2022-03-30 DIAGNOSIS — E860 Dehydration: Secondary | ICD-10-CM | POA: Diagnosis not present

## 2022-03-30 DIAGNOSIS — R1013 Epigastric pain: Secondary | ICD-10-CM | POA: Diagnosis not present

## 2022-03-30 DIAGNOSIS — R509 Fever, unspecified: Secondary | ICD-10-CM | POA: Diagnosis not present

## 2022-03-30 DIAGNOSIS — G4733 Obstructive sleep apnea (adult) (pediatric): Secondary | ICD-10-CM | POA: Diagnosis not present

## 2022-03-30 DIAGNOSIS — D72829 Elevated white blood cell count, unspecified: Secondary | ICD-10-CM | POA: Diagnosis not present

## 2022-03-30 DIAGNOSIS — Z66 Do not resuscitate: Secondary | ICD-10-CM | POA: Diagnosis not present

## 2022-03-30 DIAGNOSIS — R6510 Systemic inflammatory response syndrome (SIRS) of non-infectious origin without acute organ dysfunction: Secondary | ICD-10-CM | POA: Diagnosis not present

## 2022-03-30 DIAGNOSIS — N281 Cyst of kidney, acquired: Secondary | ICD-10-CM | POA: Diagnosis not present

## 2022-03-30 DIAGNOSIS — R531 Weakness: Secondary | ICD-10-CM | POA: Diagnosis not present

## 2022-03-30 DIAGNOSIS — K859 Acute pancreatitis without necrosis or infection, unspecified: Secondary | ICD-10-CM | POA: Diagnosis not present

## 2022-03-30 DIAGNOSIS — I10 Essential (primary) hypertension: Secondary | ICD-10-CM | POA: Diagnosis not present

## 2022-03-30 DIAGNOSIS — I959 Hypotension, unspecified: Secondary | ICD-10-CM | POA: Diagnosis not present

## 2022-03-30 DIAGNOSIS — N400 Enlarged prostate without lower urinary tract symptoms: Secondary | ICD-10-CM | POA: Diagnosis not present

## 2022-03-31 ENCOUNTER — Telehealth: Payer: Self-pay

## 2022-03-31 NOTE — Telephone Encounter (Signed)
Noted  

## 2022-03-31 NOTE — Telephone Encounter (Signed)
Pt's spouse [name]) lvm stating Mr. Cuthbert has been hospitalized for pancreatitis and dehydration. FYI  Unsure if pt will make appt with Dr. Darene Lamer this week.

## 2022-04-02 ENCOUNTER — Ambulatory Visit: Payer: Medicare PPO | Admitting: Sports Medicine

## 2022-04-07 ENCOUNTER — Encounter: Payer: Self-pay | Admitting: Family Medicine

## 2022-04-07 ENCOUNTER — Ambulatory Visit (INDEPENDENT_AMBULATORY_CARE_PROVIDER_SITE_OTHER): Payer: Medicare PPO

## 2022-04-07 ENCOUNTER — Other Ambulatory Visit: Payer: Self-pay | Admitting: Family Medicine

## 2022-04-07 ENCOUNTER — Ambulatory Visit: Payer: Medicare PPO | Admitting: Family Medicine

## 2022-04-07 VITALS — BP 133/82 | HR 70 | Ht 70.0 in | Wt 285.0 lb

## 2022-04-07 DIAGNOSIS — K853 Drug induced acute pancreatitis without necrosis or infection: Secondary | ICD-10-CM | POA: Diagnosis not present

## 2022-04-07 DIAGNOSIS — M545 Low back pain, unspecified: Secondary | ICD-10-CM

## 2022-04-07 DIAGNOSIS — M47816 Spondylosis without myelopathy or radiculopathy, lumbar region: Secondary | ICD-10-CM

## 2022-04-07 DIAGNOSIS — K859 Acute pancreatitis without necrosis or infection, unspecified: Secondary | ICD-10-CM | POA: Insufficient documentation

## 2022-04-07 DIAGNOSIS — M5137 Other intervertebral disc degeneration, lumbosacral region: Secondary | ICD-10-CM | POA: Diagnosis not present

## 2022-04-07 DIAGNOSIS — M1712 Unilateral primary osteoarthritis, left knee: Secondary | ICD-10-CM

## 2022-04-07 DIAGNOSIS — M4807 Spinal stenosis, lumbosacral region: Secondary | ICD-10-CM | POA: Diagnosis not present

## 2022-04-07 MED ORDER — KETOROLAC TROMETHAMINE 30 MG/ML IJ SOLN
30.0000 mg | Freq: Once | INTRAMUSCULAR | Status: AC
  Administered 2022-04-07: 30 mg via INTRAMUSCULAR

## 2022-04-07 NOTE — Progress Notes (Signed)
John Morrow - 71 y.o. male MRN [id number]  Date of birth: 10/03/1950  Subjective Chief Complaint  Patient presents with   Hospitalization Follow-up    HPI John Morrow is a 71 y.o. male here today for hospital follow up.  Seen in ED for abdominal pain and lethargy.    Had elevated white count and elevated temp.  CT scan with acute pancreatitis.  No gallstones noted on gallbladder US.  No EtOH use.  Thought may be due to [place] which was recently started.  He was able to tolerate advancement  in diet without issue.  Today he reports that he is feeling much better.  Continues to have some fatigue.  Denies abdominal pain.  Appetite is normal at this time..  Bowels are moving normally.Marland Kitchen  He remains afebrile.   John Morrow is also complaining of low back pain.  Pain is nonradicular.  He has had back pain for a while however this is worsened recently.  He has not tried anything for treatment so far.  ROS:  A comprehensive ROS was completed and negative except as noted per HPI   ROS:  A comprehensive ROS was completed and negative except as noted per HPI  Allergies  Allergen Reactions   Morphine And Related     Rash, vomiting, itching     Past Medical History:  Diagnosis Date   Arthritis    Back pain    Diverticulosis of colon without hemorrhage 06/12/2016   On Colonoscopy 11/06/2010   HLD (hyperlipidemia) 05/29/2016   Hyperlipemia    Hypertension    Joint pain    Morbid obesity ([place]) 05/29/2016   Prediabetes    Right hip pain    needs hip replacement   Sleep apnea    on CPAP    Past Surgical History:  Procedure Laterality Date   KNEE SURGERY     REPLACEMENT TOTAL HIP W/  RESURFACING IMPLANTS Right 34/1962   Dr. Aleda Grana    Social History   Socioeconomic History   Marital status: Married    Spouse name: [name]   Number of children: 4   Years of education: 18   Highest education level: Occupational hygienist History   Occupation: retired    Comment: Pharmacist, hospital  Tobacco  Use   Smoking status: Never   Smokeless tobacco: Never  Scientific laboratory technician Use: Never used  Substance and Sexual Activity   Alcohol use: Not Currently    Comment: rarely   Drug use: No   Sexual activity: Yes    Partners: Female  Other Topics Concern   Not on file  Social History Narrative   Lives with his wife. 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   Social Determinants of Health   Financial Resource Strain: Low Risk  (09/23/2021)   Overall Financial Resource Strain (CARDIA)    Difficulty of Paying Living Expenses: Not hard at all  Food Insecurity: No Food Insecurity (09/23/2021)   Hunger Vital Sign    Worried About Running Out of Food in the Last Year: Never true    Ran Out of Food in the Last Year: Never true  Transportation Needs: No Transportation Needs (09/23/2021)   PRAPARE - Hydrologist (Medical): No    Lack of Transportation (Non-Medical): No  Physical Activity: Inactive (09/23/2021)   Exercise Vital Sign    Days of Exercise per Week: 0 days    Minutes of Exercise per Session:  0 min  Stress: No Stress Concern Present (09/23/2021)   [place]    Feeling of Stress : Not at all  Social Connections: Moderately Integrated (09/23/2021)   Social Connection and Isolation Panel [NHANES]    Frequency of Communication with Friends and Family: More than three times a week    Frequency of Social Gatherings with Friends and Family: Once a week    Attends Religious Services: More than 4 times per year    Active Member of [employer] or Organizations: No    Attends Archivist Meetings: Never    Marital Status: Married    Family History  Problem Relation Age of Onset   Lung disease Mother    Hypertension Mother    Lung disease Father    Alcoholism Father    Colon cancer Neg Hx    Colon polyps Neg Hx    Esophageal cancer Neg Hx     Stomach cancer Neg Hx    Rectal cancer Neg Hx     Health Maintenance  Topic Date Due   COVID-19 Vaccine (3 - Pfizer series) 05/16/2022 (Originally 03/12/2020)   INFLUENZA VACCINE  04/15/2022   COLONOSCOPY (Pts 45-70yr Insurance coverage will need to be confirmed)  05/24/2022   TETANUS/TDAP  05/29/2026   Pneumonia Vaccine 72 Years old  Completed   Hepatitis C Screening  Completed   Zoster Vaccines- Shingrix  Completed   HPV VACCINES  Aged Out     ----------------------------------------------------------------------------------------------------------------------------------------------------------------------------------------------------------------- Physical Exam BP 133/82 (BP Location: Left Arm, Patient Position: Sitting, Cuff Size: Large)   Pulse 70   Ht '5\' 10"'$  (1.778 m)   Wt 285 lb (129.3 kg)   SpO2 97%   BMI 40.89 kg/m   Physical Exam Constitutional:      Appearance: Normal appearance.  Eyes:     General: No scleral icterus. Cardiovascular:     Rate and Rhythm: Normal rate and regular rhythm.  Pulmonary:     Effort: Pulmonary effort is normal.     Breath sounds: Normal breath sounds.  Abdominal:     General: There is no distension.     Palpations: Abdomen is soft.     Tenderness: There is no abdominal tenderness.  Musculoskeletal:     Cervical back: Neck supple.  Neurological:     General: No focal deficit present.     Mental Status: He is alert.  Psychiatric:        Mood and Affect: Mood normal.        Behavior: Behavior normal.     ------------------------------------------------------------------------------------------------------------------------------------------------------------------------------------------------------------------- Assessment and Plan  Acute pancreatitis No significant abdominal pain at this time and appetite is improved.  Pancreatitis is likely secondary to Ozempic use.  Recommend he avoid GLP-1's at this time.  Rechecking  CMP, CBC and lipase today.  Lumbar spondylosis He is having increased low back pain.  Updated x-rays ordered.  Injection of Toradol 30 mg given today.   Meds ordered this encounter  Medications   ketorolac (TORADOL) 30 MG/ML injection 30 mg    Return in about 3 months (around 07/08/2022) for HTN.    This visit occurred during the SARS-CoV-2 public health emergency.  Safety protocols were in place, including screening questions prior to the visit, additional usage of staff PPE, and extensive cleaning of exam room while observing appropriate contact time as indicated for disinfecting solutions.

## 2022-04-07 NOTE — Assessment & Plan Note (Signed)
He is having increased low back pain.  Updated x-rays ordered.  Injection of Toradol 30 mg given today.

## 2022-04-07 NOTE — Patient Instructions (Addendum)
Have xrays completed.  Use meloxicam/tramadol as needed.  I have placed orders for physical therapy. You will be contacted to set this up.   We'll be in touch with xray and lab results.

## 2022-04-07 NOTE — Assessment & Plan Note (Signed)
No significant abdominal pain at this time and appetite is improved.  Pancreatitis is likely secondary to Ozempic use.  Recommend he avoid GLP-1's at this time.  Rechecking CMP, CBC and lipase today.

## 2022-04-08 ENCOUNTER — Inpatient Hospital Stay: Payer: Medicare PPO | Admitting: Family Medicine

## 2022-04-08 LAB — COMPLETE METABOLIC PANEL WITH GFR
AG Ratio: 1.3 (calc) (ref 1.0–2.5)
ALT: 32 U/L (ref 9–46)
AST: 19 U/L (ref 10–35)
Albumin: 3.7 g/dL (ref 3.6–5.1)
Alkaline phosphatase (APISO): 68 U/L (ref 35–144)
BUN: 19 mg/dL (ref 7–25)
CO2: 30 mmol/L (ref 20–32)
Calcium: 9.5 mg/dL (ref 8.6–10.3)
Chloride: 104 mmol/L (ref 98–110)
Creat: 1.1 mg/dL (ref 0.70–1.28)
Globulin: 2.8 g/dL (calc) (ref 1.9–3.7)
Glucose, Bld: 98 mg/dL (ref 65–99)
Potassium: 4.3 mmol/L (ref 3.5–5.3)
Sodium: 143 mmol/L (ref 135–146)
Total Bilirubin: 0.6 mg/dL (ref 0.2–1.2)
Total Protein: 6.5 g/dL (ref 6.1–8.1)
eGFR: 71 mL/min/{1.73_m2} (ref >=60)

## 2022-04-08 LAB — CBC WITH DIFFERENTIAL/PLATELET
Absolute Monocytes: 653 cells/uL (ref 200–950)
Basophils Absolute: 83 cells/uL (ref 0–200)
Basophils Relative: 1.3 %
Eosinophils Absolute: 198 cells/uL (ref 15–500)
Eosinophils Relative: 3.1 %
HCT: 45.9 % (ref 38.5–50.0)
Hemoglobin: 14.9 g/dL (ref 13.2–17.1)
Lymphs Abs: 1018 cells/uL (ref 850–3900)
MCH: 29.9 pg (ref 27.0–33.0)
MCHC: 32.5 g/dL (ref 32.0–36.0)
MCV: 92 fL (ref 80.0–100.0)
MPV: 9.9 fL (ref 7.5–12.5)
Monocytes Relative: 10.2 %
Neutro Abs: 4448 cells/uL (ref 1500–7800)
Neutrophils Relative %: 69.5 %
Platelets: 329 10*3/uL (ref 140–400)
RBC: 4.99 10*6/uL (ref 4.20–5.80)
RDW: 11.4 % (ref 11.0–15.0)
Total Lymphocyte: 15.9 %
WBC: 6.4 10*3/uL (ref 3.8–10.8)

## 2022-04-08 LAB — LIPASE: Lipase: 17 U/L (ref 7–60)

## 2022-04-11 ENCOUNTER — Encounter: Payer: Self-pay | Admitting: Family Medicine

## 2022-04-15 ENCOUNTER — Other Ambulatory Visit: Payer: Self-pay

## 2022-04-15 ENCOUNTER — Encounter: Payer: Self-pay | Admitting: Rehabilitative and Restorative Service Providers"

## 2022-04-15 ENCOUNTER — Telehealth: Payer: Self-pay

## 2022-04-15 ENCOUNTER — Ambulatory Visit: Payer: Medicare PPO | Attending: Family Medicine | Admitting: Rehabilitative and Restorative Service Providers"

## 2022-04-15 DIAGNOSIS — M545 Low back pain, unspecified: Secondary | ICD-10-CM | POA: Insufficient documentation

## 2022-04-15 DIAGNOSIS — R29898 Other symptoms and signs involving the musculoskeletal system: Secondary | ICD-10-CM | POA: Insufficient documentation

## 2022-04-15 DIAGNOSIS — M5459 Other low back pain: Secondary | ICD-10-CM | POA: Diagnosis not present

## 2022-04-15 NOTE — Therapy (Signed)
[facility] [place], [place], [address] Phone: [phone]   Fax:  [phone]  Physical Therapy Evaluation  Patient Details  Name: John Morrow MRN: [id number] Date of Birth: Aug 16, 1951 No data recorded  Encounter Date: 04/15/2022   PT End of Session - 04/15/22 0804     Visit Number 1    Number of Visits 12    Date for PT Re-Evaluation 05/28/22    PT Start Time 0801    PT Stop Time 9470    PT Time Calculation (min) 54 min    Activity Tolerance Patient tolerated treatment well             Past Medical History:  Diagnosis Date   Arthritis    Back pain    Diverticulosis of colon without hemorrhage 06/12/2016   On Colonoscopy 11/06/2010   HLD (hyperlipidemia) 05/29/2016   Hyperlipemia    Hypertension    Joint pain    Morbid obesity ([place]) 05/29/2016   Prediabetes    Right hip pain    needs hip replacement   Sleep apnea    on CPAP    Past Surgical History:  Procedure Laterality Date   KNEE SURGERY     REPLACEMENT TOTAL HIP W/  RESURFACING IMPLANTS Right 96/2836   Dr. Aleda Grana    OUTPATIENT PHYSICAL THERAPY THORACOLUMBAR EVALUATION   Patient Name: John Morrow MRN: [id number] DOB:[birth year], 71 y.o., male Today's Date: 04/15/2022   PT End of Session - 04/15/22 0804     Visit Number 1    Number of Visits 12    Date for PT Re-Evaluation 05/28/22    PT Start Time 0801    PT Stop Time 5035    PT Time Calculation (min) 54 min    Activity Tolerance Patient tolerated treatment well             Past Medical History:  Diagnosis Date   Arthritis    Back pain    Diverticulosis of colon without hemorrhage 06/12/2016   On Colonoscopy 11/06/2010   HLD (hyperlipidemia) 05/29/2016   Hyperlipemia    Hypertension    Joint pain    Morbid obesity ([place]) 05/29/2016   Prediabetes    Right hip pain    needs hip replacement   Sleep apnea    on CPAP   Past Surgical History:  Procedure  Laterality Date   KNEE SURGERY     REPLACEMENT TOTAL HIP W/  RESURFACING IMPLANTS Right 46/5681   Dr. Aleda Grana   Patient Active Problem List   Diagnosis Date Noted   Acute pancreatitis 04/07/2022   OSA (obstructive sleep apnea) 02/19/2022   Gout 12/24/2021   Chronic rhinitis 08/21/2021   Impotence of organic origin 02/13/2021   Lumbar spondylosis 10/09/2020   Pre-operative exam 07/18/2020   Verruca 05/01/2020   Epiploic appendagitis 02/20/2020   Bilateral shoulder region arthritis 04/13/2019   Vitamin D deficiency 02/08/2019   Insulin resistance 02/08/2019   Class 2 severe obesity with serious comorbidity and body mass index (BMI) of 39.0 to 39.9 in adult ([place]) 02/08/2019   Prediabetes 02/08/2019   Chronic pain 09/28/2018   Hip pain 06/28/2018   Dependent edema 05/05/2017   Primary osteoarthritis of left knee 09/25/2016   BPH (benign prostatic hyperplasia) 05/29/2016   Urine frequency 05/29/2016   HTN (hypertension) 05/29/2016   HLD (hyperlipidemia) 05/29/2016   Class 2 obesity due to excess calories without serious comorbidity with body mass index (BMI) of 39.0 to 39.9  in adult 05/29/2016   Sleep apnea 03/31/2014    PCP: Dr Luetta Nutting  REFERRING PROVIDER: Dr Luetta Nutting  REFERRING DIAG: Acute LBP  Rationale for Evaluation and Treatment Rehabilitation  THERAPY DIAG:  Other low back pain  Other symptoms and signs involving the musculoskeletal system  ONSET DATE: 04/07/22; history of LBP for ~ 5 years   SUBJECTIVE:                                                                                                                                                                                           SUBJECTIVE STATEMENT: Patient reports that he has had LBP for 4-5 years of gradual onset. He had flare up of symptoms 04/07/22 when he leaned backward. He has meds that help some maybe.  PERTINENT HISTORY:  HTN; hyperlipemia; Rt THA; obesity; back pain; prediabetes; sleep  apnea;   PAIN:  Are you having pain? Yes: NPRS scale: 5/10 Pain location: low back Pain description: sharp Aggravating factors: standing; walking; steps; lifting Relieving factors: sitting; not moving   PRECAUTIONS: None  WEIGHT BEARING RESTRICTIONS No  FALLS:  Has patient fallen in last 6 months? Yes. Number of falls 1  LIVING ENVIRONMENT: Lives with: lives with their family Lives in: House/apartment Stairs: No Has following equipment at home:   OCCUPATION: retired from [employer]; cares for grandchildren; minimal activities; walking ~ 2 days/wk walking dog 20-30 min   PLOF: Independent  PATIENT GOALS pain free;    OBJECTIVE:   DIAGNOSTIC FINDINGS:  Advanced degenerative disc disease L1-L2, L2-L3, L5-S1  PATIENT SURVEYS:  FOTO 44  SCREENING FOR RED FLAGS: Bowel or bladder incontinence: No Spinal tumors: No Cauda equina syndrome: No Compression fracture: No Abdominal aneurysm: No  COGNITION:  Overall cognitive status: Within functional limits for tasks assessed     SENSATION: WFL  MUSCLE LENGTH: Hamstrings: tight Rt ~ 60 deg; Lt 65 deg Hip flexors tight Rt > Lt unable to tolerate [employer] test  POSTURE: rounded shoulders, forward head, decreased lumbar lordosis, increased thoracic kyphosis, posterior pelvic tilt, and flexed trunk   PALPATION: Muscular tightness Lt QL, lats, lumbar paraspinals, iliopsoas  LUMBAR ROM:   Active  A/PROM  eval  Flexion 45%  Extension -10%  Right lateral flexion 35%  Left lateral flexion 30%  Right rotation 15%  Left rotation 10%   (Blank rows = not tested)  LOWER EXTREMITY ROM:     Active  Right eval Left eval  Hip flexion tight tight  Hip extension -10 -10  Hip abduction tight tight  Hip adduction    Hip internal rotation    Hip external rotation  Knee flexion 90 90  Knee extension WNL's WNL's  Ankle dorsiflexion    Ankle plantarflexion    Ankle inversion    Ankle eversion     (Blank rows = not  tested)  LOWER EXTREMITY MMT:    MMT Right eval Left eval  Hip flexion 4/5 4/5  Hip extension 4-/5 4/4  Hip abduction 4-/5 4-/5  Hip adduction    Hip internal rotation    Hip external rotation    Knee flexion 5/5 5/5  Knee extension 5/5 5/5  Ankle dorsiflexion    Ankle plantarflexion    Ankle inversion    Ankle eversion     (Blank rows = not tested)  LUMBAR SPECIAL TESTS:  Straight leg raise test: Negative and Slump test: Negative    GAIT: Distance walked: 80 ft Assistive device utilized: None Comments: Antalgic gait with forward flexed trunk and hips     TODAY'S TREATMENT  Back education Sitting with more upright posture using pillow and or noodle  Exercises: Prone prop as tolerated 2-4 min Glut set 10 sec x 10 prone Quad stretch prone with strap 30 sec x 3 reps   Sitting  Hip flexor stretch 30 sec x 2 each side   Supine  Core contraction 10 sec  Diaphragmatic breathing - handout for home   Standing  Back at wall working on posture with core contraction    PATIENT EDUCATION:  Education details: Posture, alignment; HEP; POC; DN; TENS for home which pt has  Person educated: Patient Education method: Explanation, Demonstration, Tactile cues, Verbal cues, and Handouts Education comprehension: verbalized understanding, returned demonstration, verbal cues required, tactile cues required, and needs further education   HOME EXERCISE PROGRAM: Access Code: [email or web address] URL: [email or web address] Date: 04/15/2022 Prepared by: Gillermo Murdoch  Exercises - Prone Press Up On Elbows  - 2 x daily - 7 x weekly - 1 sets - 1 reps - 2-4 min  hold - Prone Gluteal Sets  - 2 x daily - 7 x weekly - 1 sets - 10 reps - 10 sec  hold - Prone Quadriceps Stretch with Strap  - 2 x daily - 7 x weekly - 1 sets - 3 reps - 30 sec  hold - Seated Hip Flexor Stretch  - 2 x daily - 7 x weekly - 1 sets - 3 reps - 30 sec  hold - Supine Transversus Abdominis Bracing with Pelvic  Floor Contraction  - 2 x daily - 7 x weekly - 1 sets - 10 reps - 10sec  hold - Supine Diaphragmatic Breathing  - 2 x daily - 7 x weekly - 1 sets - 10 reps - 4-6 sec  hold  Patient Education - Trigger Point Dry Needling - Posture and Body Mechanics  ASSESSMENT:  CLINICAL IMPRESSION: Patient is a 71 y.o. male who was seen today for physical therapy evaluation and treatment for acute flare up of chronic LBP. Patient has poor posture and alignment; limited trunk and LE mobility, ROM, strength; abnormal gait pattern; pain on a constant basis; limited functional and recreational activity level. He will benefit from PT to address problems identified.    OBJECTIVE IMPAIRMENTS Abnormal gait, decreased activity tolerance, decreased balance, decreased endurance, decreased mobility, decreased ROM, decreased strength, hypomobility, increased fascial restrictions, impaired flexibility, improper body mechanics, postural dysfunction, obesity, and pain.   ACTIVITY LIMITATIONS carrying, lifting, bending, sitting, standing, squatting, transfers, and bed mobility  PARTICIPATION LIMITATIONS: community activity and yard work  [place], Past/current experiences, and 1-2  comorbidities: HTN; Rt THA; prediabetes  are also affecting patient's functional outcome.   REHAB POTENTIAL: Good  CLINICAL DECISION MAKING: Stable/uncomplicated  EVALUATION COMPLEXITY: Low   GOALS: Goals reviewed with patient? Yes   LONG TERM GOALS: Target date: 05/27/2022  Improve posture and alignment with patient to demonstrate neutral spinal and hip alignment in standing Baseline:  Goal status: INITIAL  2.  Decrease back pain by 50-75% allowing patient to participate in normal functional activities with less pain and limitation  Baseline:  Goal status: INITIAL  3.  Increase spinal extension to 10-20% range in standing  Baseline:  Goal status: INITIAL  4.  Independent in HEP (including aquatic  program) Baseline:  Goal status: INITIAL  5.  Improve functional limitation score to 54 Baseline:  Goal status: INITIAL    PLAN: PT FREQUENCY: 2x/wk  PT DURATION: 6 weeks  PLANNED INTERVENTIONS: Therapeutic exercises, Therapeutic activity, Neuromuscular re-education, Balance training, Gait training, Patient/Family education, Self Care, Joint mobilization, Aquatic Therapy, Dry Needling, Electrical stimulation, Spinal mobilization, Cryotherapy, Moist heat, Taping, Manual therapy, and Re-evaluation.  PLAN FOR NEXT SESSION: review and progress with HEP; trial of DN to Lt lumbar spine; continue education and home instruction; modalities as indicated.   John Morrow, PT, MPH  04/15/2022, 8:04 AM  There were no vitals filed for this visit.                    Objective measurements completed on examination: See above findings.                              Patient will benefit from skilled therapeutic intervention in order to improve the following deficits and impairments:     Visit Diagnosis: Other low back pain  Other symptoms and signs involving the musculoskeletal system     Problem List Patient Active Problem List   Diagnosis Date Noted   Acute pancreatitis 04/07/2022   OSA (obstructive sleep apnea) 02/19/2022   Gout 12/24/2021   Chronic rhinitis 08/21/2021   Impotence of organic origin 02/13/2021   Lumbar spondylosis 10/09/2020   Pre-operative exam 07/18/2020   Verruca 05/01/2020   Epiploic appendagitis 02/20/2020   Bilateral shoulder region arthritis 04/13/2019   Vitamin D deficiency 02/08/2019   Insulin resistance 02/08/2019   Class 2 severe obesity with serious comorbidity and body mass index (BMI) of 39.0 to 39.9 in adult ([place]) 02/08/2019   Prediabetes 02/08/2019   Chronic pain 09/28/2018   Hip pain 06/28/2018   Dependent edema 05/05/2017   Primary osteoarthritis of left knee 09/25/2016   BPH (benign prostatic  hyperplasia) 05/29/2016   Urine frequency 05/29/2016   HTN (hypertension) 05/29/2016   HLD (hyperlipidemia) 05/29/2016   Class 2 obesity due to excess calories without serious comorbidity with body mass index (BMI) of 39.0 to 39.9 in adult 05/29/2016   Sleep apnea 03/31/2014    Zahirah Cheslock Nilda Simmer, PT 04/15/2022, 8:04 AM  [facility] [place], [place], [address] Phone: [phone]   Fax:  [phone]  Name: Darvis Croft MRN: [id number] Date of Birth: September 24, 1950

## 2022-04-15 NOTE — Patient Instructions (Addendum)
Aquatic Therapy: What to Expect!  Where:  [facility] at [facility] [address] [place], [place] [address] [phone]           How to Prepare:  If you require assistance with dressing, with transportation (ie: wheel chair), or toileting, a caregiver must attend the entire session with you (unless your primary therapists feels this is not necessary).   If there is thunder during your appointment, you will be asked to leave pool area. You have the option to finish your session in the physical therapy area near the gym. Masks in the pool area are optional. Your face will remain dry during your session, so you are welcome to keep your mask on, if desired. You will be spaced at least 6 feet from other aquatic patients.  Please bring your own swim towel to dry off with.   There are Men's and Women's locker rooms with showers, as well as gender neutral bathrooms in the pool area.  Please arrive IN YOUR SUIT and a few minutes prior to your appointment - this helps to avoid delays in starting your session. Head to the pool and await your appointment on the bench on the pool deck. Please make sure to attend to any toileting needs prior to entering the pool. Once on the pool deck your therapist will ask you to sign the Patient  Consent and Assignment of Benefits form. Your therapist may take your blood pressure prior to, during and after your session if indicated. We usually try and create a home exercise program based on activities we do in the pool. Some patients do not want to or do not have the ability to participate in an aquatic home program - this is not a barrier in any way to you participating in aquatic therapy as part of your current therapy plan!  Appointments:  All sessions are 45 minutes  About the pool: Entering the pool: Your therapist will assist you if needed; there are two ways to enter the pool - stairs or a mechanical lift. Your therapist will determine  the most appropriate way for you. Water temperature is usually around 91-95.  There is a lap pool with a temperature around 84 There may be other therapists and patients in the pool at the same time.   Contact Info:            To cancel appointment, please call [facility] at [phone] If you are running late, please call [facility] at [phone]                  Access Code: [email or web address] URL: [email or web address] Date: 04/15/2022 Prepared by: Gillermo Murdoch  Exercises - Prone Press Up On Elbows  - 2 x daily - 7 x weekly - 1 sets - 1 reps - 2-4 min  hold - Prone Gluteal Sets  - 2 x daily - 7 x weekly - 1 sets - 10 reps - 10 sec  hold - Prone Quadriceps Stretch with Strap  - 2 x daily - 7 x weekly - 1 sets - 3 reps - 30 sec  hold - Seated Hip Flexor Stretch  - 2 x daily - 7 x weekly - 1 sets - 3 reps - 30 sec  hold - Supine Transversus Abdominis Bracing with Pelvic Floor Contraction  - 2 x daily - 7 x weekly - 1 sets - 10 reps - 10sec  hold - Supine Diaphragmatic Breathing  - 2 x daily - 7  x weekly - 1 sets - 10 reps - 4-6 sec  hold  Patient Education - Trigger Point Dry Needling - Biomedical scientist

## 2022-04-15 NOTE — Telephone Encounter (Signed)
John Morrow states he has not received a call about a sleep study. I could not find any orders in the system.

## 2022-04-15 NOTE — Addendum Note (Signed)
Addended by: Everardo All on: 04/15/2022 01:13 PM   Modules accepted: Orders

## 2022-04-17 ENCOUNTER — Encounter: Payer: Self-pay | Admitting: Rehabilitative and Restorative Service Providers"

## 2022-04-17 ENCOUNTER — Other Ambulatory Visit: Payer: Self-pay

## 2022-04-17 ENCOUNTER — Ambulatory Visit: Payer: Medicare PPO | Admitting: Rehabilitative and Restorative Service Providers"

## 2022-04-17 DIAGNOSIS — M545 Low back pain, unspecified: Secondary | ICD-10-CM | POA: Diagnosis not present

## 2022-04-17 DIAGNOSIS — M5459 Other low back pain: Secondary | ICD-10-CM

## 2022-04-17 DIAGNOSIS — R29898 Other symptoms and signs involving the musculoskeletal system: Secondary | ICD-10-CM | POA: Diagnosis not present

## 2022-04-17 NOTE — Therapy (Signed)
[facility] [address] [place], [place], [address] Phone: [phone]   Fax:  [phone]  Physical Therapy Evaluation  Patient Details  Name: John Morrow MRN: [id number] Date of Birth: Oct 20, 1950 No data recorded  Encounter Date: 04/17/2022   PT End of Session - 04/17/22 1443     Visit Number 2    Number of Visits 12    Date for PT Re-Evaluation 05/28/22    PT Start Time 5974    PT Stop Time 1531    PT Time Calculation (min) 48 min    Activity Tolerance Patient tolerated treatment well             Past Medical History:  Diagnosis Date   Arthritis    Back pain    Diverticulosis of colon without hemorrhage 06/12/2016   On Colonoscopy 11/06/2010   HLD (hyperlipidemia) 05/29/2016   Hyperlipemia    Hypertension    Joint pain    Morbid obesity ([place]) 05/29/2016   Prediabetes    Right hip pain    needs hip replacement   Sleep apnea    on CPAP    Past Surgical History:  Procedure Laterality Date   KNEE SURGERY     REPLACEMENT TOTAL HIP W/  RESURFACING IMPLANTS Right 16/3845   Dr. Aleda Grana    OUTPATIENT PHYSICAL THERAPY THORACOLUMBAR EVALUATION   Patient Name: John Morrow MRN: [id number] DOB:02-02-1951, 71 y.o., male Today's Date: 04/17/2022   PT End of Session - 04/17/22 1443     Visit Number 2    Number of Visits 12    Date for PT Re-Evaluation 05/28/22    PT Start Time 1443    PT Stop Time 1531    PT Time Calculation (min) 48 min    Activity Tolerance Patient tolerated treatment well             Past Medical History:  Diagnosis Date   Arthritis    Back pain    Diverticulosis of colon without hemorrhage 06/12/2016   On Colonoscopy 11/06/2010   HLD (hyperlipidemia) 05/29/2016   Hyperlipemia    Hypertension    Joint pain    Morbid obesity ([place]) 05/29/2016   Prediabetes    Right hip pain    needs hip replacement   Sleep apnea    on CPAP   Past Surgical History:  Procedure  Laterality Date   KNEE SURGERY     REPLACEMENT TOTAL HIP W/  RESURFACING IMPLANTS Right 22/4825   Dr. Aleda Grana   Patient Active Problem List   Diagnosis Date Noted   Acute pancreatitis 04/07/2022   OSA (obstructive sleep apnea) 02/19/2022   Gout 12/24/2021   Chronic rhinitis 08/21/2021   Impotence of organic origin 02/13/2021   Lumbar spondylosis 10/09/2020   Pre-operative exam 07/18/2020   Verruca 05/01/2020   Epiploic appendagitis 02/20/2020   Bilateral shoulder region arthritis 04/13/2019   Vitamin D deficiency 02/08/2019   Insulin resistance 02/08/2019   Class 2 severe obesity with serious comorbidity and body mass index (BMI) of 39.0 to 39.9 in adult ([place]) 02/08/2019   Prediabetes 02/08/2019   Chronic pain 09/28/2018   Hip pain 06/28/2018   Dependent edema 05/05/2017   Primary osteoarthritis of left knee 09/25/2016   BPH (benign prostatic hyperplasia) 05/29/2016   Urine frequency 05/29/2016   HTN (hypertension) 05/29/2016   HLD (hyperlipidemia) 05/29/2016   Class 2 obesity due to excess calories without serious comorbidity with body mass index (BMI) of 39.0 to 39.9  in adult 05/29/2016   Sleep apnea 03/31/2014    PCP: Dr Luetta Nutting  REFERRING PROVIDER: Dr Luetta Nutting  REFERRING DIAG: Acute LBP  Rationale for Evaluation and Treatment Rehabilitation  THERAPY DIAG:  Other low back pain  Other symptoms and signs involving the musculoskeletal system  ONSET DATE: 04/07/22; history of LBP for ~ 5 years   SUBJECTIVE:                                                                                                                                                                                           SUBJECTIVE STATEMENT: Patient reports that he is feeling some better. Good response to the exercises and the TENS unit.    PERTINENT HISTORY:  HTN; hyperlipemia; Rt THA; obesity; back pain; prediabetes; sleep apnea;   PAIN:  Are you having pain? Yes: NPRS scale:  4/10 Pain location: low back Pain description: sharp Aggravating factors: standing; walking; steps; lifting Relieving factors: sitting; not moving   PRECAUTIONS: None  WEIGHT BEARING RESTRICTIONS No  FALLS:  Has patient fallen in last 6 months? Yes. Number of falls 1  LIVING ENVIRONMENT: Lives with: lives with their family Lives in: House/apartment Stairs: No Has following equipment at home:   OCCUPATION: retired from [employer]; cares for grandchildren; minimal activities; walking ~ 2 days/wk walking dog 20-30 min   PLOF: Independent  PATIENT GOALS pain free;    OBJECTIVE:   DIAGNOSTIC FINDINGS:  Advanced degenerative disc disease L1-L2, L2-L3, L5-S1  PATIENT SURVEYS:  FOTO 44  SCREENING FOR RED FLAGS: Bowel or bladder incontinence: No Spinal tumors: No Cauda equina syndrome: No Compression fracture: No Abdominal aneurysm: No  COGNITION:  Overall cognitive status: Within functional limits for tasks assessed     SENSATION: WFL  MUSCLE LENGTH: Hamstrings: tight Rt ~ 60 deg; Lt 65 deg Hip flexors tight Rt > Lt unable to tolerate [employer] test  POSTURE: rounded shoulders, forward head, decreased lumbar lordosis, increased thoracic kyphosis, posterior pelvic tilt, and flexed trunk   PALPATION: Muscular tightness Lt QL, lats, lumbar paraspinals, iliopsoas  LUMBAR ROM:   Active  A/PROM  eval  Flexion 45%  Extension -10%  Right lateral flexion 35%  Left lateral flexion 30%  Right rotation 15%  Left rotation 10%   (Blank rows = not tested)  LOWER EXTREMITY ROM:     Active  Right eval Left eval  Hip flexion tight tight  Hip extension -10 -10  Hip abduction tight tight  Hip adduction    Hip internal rotation    Hip external rotation    Knee flexion 90 90  Knee extension WNL's WNL's  Ankle dorsiflexion    Ankle plantarflexion    Ankle inversion    Ankle eversion     (Blank rows = not tested)  LOWER EXTREMITY MMT:    MMT Right eval Left eval   Hip flexion 4/5 4/5  Hip extension 4-/5 4/4  Hip abduction 4-/5 4-/5  Hip adduction    Hip internal rotation    Hip external rotation    Knee flexion 5/5 5/5  Knee extension 5/5 5/5  Ankle dorsiflexion    Ankle plantarflexion    Ankle inversion    Ankle eversion     (Blank rows = not tested)  LUMBAR SPECIAL TESTS:  Straight leg raise test: Negative and Slump test: Negative    GAIT: Distance walked: 80 ft Assistive device utilized: None Comments: Antalgic gait with forward flexed trunk and hips     TODAY'S TREATMENT  Continued back education Sitting with more upright posture using pillow and or noodle  Exercises: Prone: Prone prop as tolerated 2-4 min Glut set 10 sec x 10 prone Quad stretch prone with strap 30 sec x 3 reps   Sitting  Hip flexor stretch 30 sec x 2 each side   Supine  Core contraction 10 sec  Diaphragmatic breathing - handout for home   Standing  Back at wall working on posture with core contraction   Dry Needling:  Information provided  Education provided re- DN and side effects DN; Lt QL, lats; lumbar paraspinals E-stim with DN mAmp x 5 min    PATIENT EDUCATION:  Education details: Posture, alignment; HEP; POC; DN; TENS for home which pt has  Person educated: Patient Education method: Explanation, Demonstration, Tactile cues, Verbal cues, and Handouts Education comprehension: verbalized understanding, returned demonstration, verbal cues required, tactile cues required, and needs further education   HOME EXERCISE PROGRAM: Access Code: [email or web address] URL: [email or web address] Date: 04/17/2022 Prepared by: Gillermo Murdoch  Exercises - Prone Press Up On Elbows  - 2 x daily - 7 x weekly - 1 sets - 1 reps - 2-4 min  hold - Prone Gluteal Sets  - 2 x daily - 7 x weekly - 1 sets - 10 reps - 10 sec  hold - Prone Quadriceps Stretch with Strap  - 2 x daily - 7 x weekly - 1 sets - 3 reps - 30 sec  hold - Seated Hip Flexor Stretch  - 2 x  daily - 7 x weekly - 1 sets - 3 reps - 30 sec  hold - Supine Transversus Abdominis Bracing with Pelvic Floor Contraction  - 2 x daily - 7 x weekly - 1 sets - 10 reps - 10sec  hold - Supine Diaphragmatic Breathing  - 2 x daily - 7 x weekly - 1 sets - 10 reps - 4-6 sec  hold - Standing Hip Extension with Counter Support  - 2 x daily - 7 x weekly - 1-2 sets - 10 reps - 3-5 sec  hold - Standing Piriformis Release with Ball at [place]  - 2 x daily - 7 x weekly - 30-60 sec  hold    ASSESSMENT:  CLINICAL IMPRESSION:  04/17/22: Good response to initial exercise and home instruction. Patient using TENs unit with good results at hoe. Improved movement, posture and gait noted. Patient has continued muscular tightness in the Lt lower quarter with tightness in the QL/lats/lumbar paraspinals and hip flexors. Trial of DN and manual work. Added ball release work.    Patient is a 71 y.o. male who was  seen today for physical therapy evaluation and treatment for acute flare up of chronic LBP. Patient has poor posture and alignment; limited trunk and LE mobility, ROM, strength; abnormal gait pattern; pain on a constant basis; limited functional and recreational activity level. He will benefit from PT to address problems identified.    OBJECTIVE IMPAIRMENTS Abnormal gait, decreased activity tolerance, decreased balance, decreased endurance, decreased mobility, decreased ROM, decreased strength, hypomobility, increased fascial restrictions, impaired flexibility, improper body mechanics, postural dysfunction, obesity, and pain.   ACTIVITY LIMITATIONS carrying, lifting, bending, sitting, standing, squatting, transfers, and bed mobility  PARTICIPATION LIMITATIONS: community activity and yard work  [place], Past/current experiences, and 1-2 comorbidities: HTN; Rt THA; prediabetes  are also affecting patient's functional outcome.   REHAB POTENTIAL: Good  CLINICAL DECISION MAKING:  Stable/uncomplicated  EVALUATION COMPLEXITY: Low   GOALS: Goals reviewed with patient? Yes   LONG TERM GOALS: Target date: 05/29/2022  Improve posture and alignment with patient to demonstrate neutral spinal and hip alignment in standing Baseline:  Goal status: INITIAL  2.  Decrease back pain by 50-75% allowing patient to participate in normal functional activities with less pain and limitation  Baseline:  Goal status: INITIAL  3.  Increase spinal extension to 10-20% range in standing  Baseline:  Goal status: INITIAL  4.  Independent in HEP (including aquatic program) Baseline:  Goal status: INITIAL  5.  Improve functional limitation score to 54 Baseline:  Goal status: INITIAL    PLAN: PT FREQUENCY: 2x/wk  PT DURATION: 6 weeks  PLANNED INTERVENTIONS: Therapeutic exercises, Therapeutic activity, Neuromuscular re-education, Balance training, Gait training, Patient/Family education, Self Care, Joint mobilization, Aquatic Therapy, Dry Needling, Electrical stimulation, Spinal mobilization, Cryotherapy, Moist heat, Taping, Manual therapy, and Re-evaluation.  PLAN FOR NEXT SESSION: review and progress with HEP; trial of DN to Lt lumbar spine; continue education and home instruction; modalities as indicated.   Everardo All, PT, MPH  04/17/2022, 2:44 PM  There were no vitals filed for this visit.                    Objective measurements completed on examination: See above findings.                              Patient will benefit from skilled therapeutic intervention in order to improve the following deficits and impairments:     Visit Diagnosis: Other low back pain  Other symptoms and signs involving the musculoskeletal system     Problem List Patient Active Problem List   Diagnosis Date Noted   Acute pancreatitis 04/07/2022   OSA (obstructive sleep apnea) 02/19/2022   Gout 12/24/2021   Chronic rhinitis 08/21/2021    Impotence of organic origin 02/13/2021   Lumbar spondylosis 10/09/2020   Pre-operative exam 07/18/2020   Verruca 05/01/2020   Epiploic appendagitis 02/20/2020   Bilateral shoulder region arthritis 04/13/2019   Vitamin D deficiency 02/08/2019   Insulin resistance 02/08/2019   Class 2 severe obesity with serious comorbidity and body mass index (BMI) of 39.0 to 39.9 in adult ([place]) 02/08/2019   Prediabetes 02/08/2019   Chronic pain 09/28/2018   Hip pain 06/28/2018   Dependent edema 05/05/2017   Primary osteoarthritis of left knee 09/25/2016   BPH (benign prostatic hyperplasia) 05/29/2016   Urine frequency 05/29/2016   HTN (hypertension) 05/29/2016   HLD (hyperlipidemia) 05/29/2016   Class 2 obesity due to excess calories without serious comorbidity with body  mass index (BMI) of 39.0 to 39.9 in adult 05/29/2016   Sleep apnea 03/31/2014    Leanza Shepperson Nilda Simmer, PT, MPH  04/17/2022, 2:44 PM  [facility] [place], [place], [address] Phone: [phone]   Fax:  [phone]  Name: John Morrow MRN: [id number] Date of Birth: 01/17/1951 [place] [address] [place], [place], [address] Phone: [phone]   Fax:  [phone]  Physical Therapy Treatment  Patient Details  Name: John Morrow MRN: [id number] Date of Birth: February 16, 1951 No data recorded  Encounter Date: 04/17/2022   PT End of Session - 04/17/22 1443     Visit Number 2    Number of Visits 12    Date for PT Re-Evaluation 05/28/22    PT Start Time 9024    PT Stop Time 1531    PT Time Calculation (min) 48 min    Activity Tolerance Patient tolerated treatment well             Past Medical History:  Diagnosis Date   Arthritis    Back pain    Diverticulosis of colon without hemorrhage 06/12/2016   On Colonoscopy 11/06/2010   HLD (hyperlipidemia) 05/29/2016   Hyperlipemia    Hypertension     Joint pain    Morbid obesity ([place]) 05/29/2016   Prediabetes    Right hip pain    needs hip replacement   Sleep apnea    on CPAP    Past Surgical History:  Procedure Laterality Date   KNEE SURGERY     REPLACEMENT TOTAL HIP W/  RESURFACING IMPLANTS Right 05/7352   Dr. Aleda Grana    There were no vitals filed for this visit.                                            Patient will benefit from skilled therapeutic intervention in order to improve the following deficits and impairments:     Visit Diagnosis: Other low back pain  Other symptoms and signs involving the musculoskeletal system     Problem List Patient Active Problem List   Diagnosis Date Noted   Acute pancreatitis 04/07/2022   OSA (obstructive sleep apnea) 02/19/2022   Gout 12/24/2021   Chronic rhinitis 08/21/2021   Impotence of organic origin 02/13/2021   Lumbar spondylosis 10/09/2020   Pre-operative exam 07/18/2020   Verruca 05/01/2020   Epiploic appendagitis 02/20/2020   Bilateral shoulder region arthritis 04/13/2019   Vitamin D deficiency 02/08/2019   Insulin resistance 02/08/2019   Class 2 severe obesity with serious comorbidity and body mass index (BMI) of 39.0 to 39.9 in adult ([place]) 02/08/2019   Prediabetes 02/08/2019   Chronic pain 09/28/2018   Hip pain 06/28/2018   Dependent edema 05/05/2017   Primary osteoarthritis of left knee 09/25/2016   BPH (benign prostatic hyperplasia) 05/29/2016   Urine frequency 05/29/2016   HTN (hypertension) 05/29/2016   HLD (hyperlipidemia) 05/29/2016   Class 2 obesity due to excess calories without serious comorbidity with body mass index (BMI) of 39.0 to 39.9 in adult 05/29/2016   Sleep apnea 03/31/2014    Tamari Busic Nilda Simmer, PT 04/17/2022, 2:43 PM  [facility] [place], [place], [address] Phone: [phone]   Fax:  [phone]  Name: John Morrow MRN:  [id number] Date of Birth: 23-Nov-1950

## 2022-04-22 NOTE — Telephone Encounter (Signed)
I called [facility] again and spoke with Aldona Bar regarding Mr. Halter referral. I was told they did not have the referral and she would check with the CMA's to see if they had it. I told her I did not understand how they had the referral on the 24th of July but do not have it today and I was going to refax it. I did let Dr Zigmund Daniel know what I was told. - CF

## 2022-04-23 ENCOUNTER — Ambulatory Visit: Payer: Medicare PPO | Admitting: Rehabilitative and Restorative Service Providers"

## 2022-04-23 ENCOUNTER — Encounter: Payer: Self-pay | Admitting: Rehabilitative and Restorative Service Providers"

## 2022-04-23 ENCOUNTER — Encounter (INDEPENDENT_AMBULATORY_CARE_PROVIDER_SITE_OTHER): Payer: Self-pay

## 2022-04-23 DIAGNOSIS — M545 Low back pain, unspecified: Secondary | ICD-10-CM | POA: Diagnosis not present

## 2022-04-23 DIAGNOSIS — R29898 Other symptoms and signs involving the musculoskeletal system: Secondary | ICD-10-CM | POA: Diagnosis not present

## 2022-04-23 DIAGNOSIS — M5459 Other low back pain: Secondary | ICD-10-CM

## 2022-04-23 NOTE — Therapy (Signed)
[facility] [address] [place], [place], [address] Phone: [phone]   Fax:  [phone]  Physical Therapy Evaluation  Patient Details  Name: John Morrow MRN: [id number] Date of Birth: 11-20-1950 No data recorded  Encounter Date: 04/23/2022   PT End of Session - 04/23/22 0806     Visit Number 3    Number of Visits 12    Date for PT Re-Evaluation 05/28/22    PT Start Time 0800    PT Stop Time 0848    PT Time Calculation (min) 48 min    Activity Tolerance Patient tolerated treatment well             Past Medical History:  Diagnosis Date   Arthritis    Back pain    Diverticulosis of colon without hemorrhage 06/12/2016   On Colonoscopy 11/06/2010   HLD (hyperlipidemia) 05/29/2016   Hyperlipemia    Hypertension    Joint pain    Morbid obesity ([place]) 05/29/2016   Prediabetes    Right hip pain    needs hip replacement   Sleep apnea    on CPAP    Past Surgical History:  Procedure Laterality Date   KNEE SURGERY     REPLACEMENT TOTAL HIP W/  RESURFACING IMPLANTS Right 96/2229   Dr. Aleda Grana    OUTPATIENT PHYSICAL THERAPY THORACOLUMBAR EVALUATION   Patient Name: John Morrow MRN: [id number] DOB:1951-04-28, 71 y.o., male Today's Date: 04/23/2022   PT End of Session - 04/23/22 0806     Visit Number 3    Number of Visits 12    Date for PT Re-Evaluation 05/28/22    PT Start Time 0800    PT Stop Time 0848    PT Time Calculation (min) 48 min    Activity Tolerance Patient tolerated treatment well             Past Medical History:  Diagnosis Date   Arthritis    Back pain    Diverticulosis of colon without hemorrhage 06/12/2016   On Colonoscopy 11/06/2010   HLD (hyperlipidemia) 05/29/2016   Hyperlipemia    Hypertension    Joint pain    Morbid obesity ([place]) 05/29/2016   Prediabetes    Right hip pain    needs hip replacement   Sleep apnea    on CPAP   Past Surgical History:  Procedure  Laterality Date   KNEE SURGERY     REPLACEMENT TOTAL HIP W/  RESURFACING IMPLANTS Right 17/4081   Dr. Aleda Grana   Patient Active Problem List   Diagnosis Date Noted   Acute pancreatitis 04/07/2022   OSA (obstructive sleep apnea) 02/19/2022   Gout 12/24/2021   Chronic rhinitis 08/21/2021   Impotence of organic origin 02/13/2021   Lumbar spondylosis 10/09/2020   Pre-operative exam 07/18/2020   Verruca 05/01/2020   Epiploic appendagitis 02/20/2020   Bilateral shoulder region arthritis 04/13/2019   Vitamin D deficiency 02/08/2019   Insulin resistance 02/08/2019   Class 2 severe obesity with serious comorbidity and body mass index (BMI) of 39.0 to 39.9 in adult ([place]) 02/08/2019   Prediabetes 02/08/2019   Chronic pain 09/28/2018   Hip pain 06/28/2018   Dependent edema 05/05/2017   Primary osteoarthritis of left knee 09/25/2016   BPH (benign prostatic hyperplasia) 05/29/2016   Urine frequency 05/29/2016   HTN (hypertension) 05/29/2016   HLD (hyperlipidemia) 05/29/2016   Class 2 obesity due to excess calories without serious comorbidity with body mass index (BMI) of 39.0 to 39.9  in adult 05/29/2016   Sleep apnea 03/31/2014    PCP: Dr Luetta Nutting  REFERRING PROVIDER: Dr Luetta Nutting  REFERRING DIAG: Acute LBP  Rationale for Evaluation and Treatment Rehabilitation  THERAPY DIAG:  Other low back pain  Other symptoms and signs involving the musculoskeletal system  ONSET DATE: 04/07/22; history of LBP for ~ 5 years   SUBJECTIVE:                                                                                                                                                                                           SUBJECTIVE STATEMENT: Patient reports that he had a tough weekend. He was travelling with his family to watch 2 major league baseball games. He did a lot of walking and sitting and had no time for exercises. Did not take his TENS unit.    PERTINENT HISTORY:  HTN;  hyperlipemia; Rt THA; obesity; back pain; prediabetes; sleep apnea;   PAIN:  Are you having pain? Yes: NPRS scale: 6/10 Pain location:  Lt > Rt low back Pain description: sharp Aggravating factors: standing; walking; steps; lifting Relieving factors: sitting; not moving   PRECAUTIONS: None  WEIGHT BEARING RESTRICTIONS No  FALLS:  Has patient fallen in last 6 months? Yes. Number of falls 1  LIVING ENVIRONMENT: Lives with: lives with their family Lives in: House/apartment Stairs: No Has following equipment at home:   OCCUPATION: retired from [employer]; cares for grandchildren; minimal activities; walking ~ 2 days/wk walking dog 20-30 min   PLOF: Independent  PATIENT GOALS pain free;    OBJECTIVE:   DIAGNOSTIC FINDINGS:  Advanced degenerative disc disease L1-L2, L2-L3, L5-S1  PATIENT SURVEYS:  FOTO 44  SCREENING FOR RED FLAGS: Bowel or bladder incontinence: No Spinal tumors: No Cauda equina syndrome: No Compression fracture: No Abdominal aneurysm: No  COGNITION:  Overall cognitive status: Within functional limits for tasks assessed     SENSATION: WFL  MUSCLE LENGTH: Hamstrings: tight Rt ~ 60 deg; Lt 65 deg Hip flexors tight Rt > Lt unable to tolerate [employer] test  POSTURE: rounded shoulders, forward head, decreased lumbar lordosis, increased thoracic kyphosis, posterior pelvic tilt, and flexed trunk   PALPATION: Muscular tightness Lt QL, lats, lumbar paraspinals, iliopsoas  LUMBAR ROM:   Active  A/PROM  eval  Flexion 45%  Extension -10%  Right lateral flexion 35%  Left lateral flexion 30%  Right rotation 15%  Left rotation 10%   (Blank rows = not tested)  LOWER EXTREMITY ROM:     Active  Right eval Left eval  Hip flexion tight tight  Hip extension -10 -10  Hip abduction tight tight  Hip adduction    Hip internal rotation    Hip external rotation    Knee flexion 90 90  Knee extension WNL's WNL's  Ankle dorsiflexion    Ankle plantarflexion     Ankle inversion    Ankle eversion     (Blank rows = not tested)  LOWER EXTREMITY MMT:    MMT Right eval Left eval  Hip flexion 4/5 4/5  Hip extension 4-/5 4/4  Hip abduction 4-/5 4-/5  Hip adduction    Hip internal rotation    Hip external rotation    Knee flexion 5/5 5/5  Knee extension 5/5 5/5  Ankle dorsiflexion    Ankle plantarflexion    Ankle inversion    Ankle eversion     (Blank rows = not tested)  LUMBAR SPECIAL TESTS:  Straight leg raise test: Negative and Slump test: Negative    GAIT: Distance walked: 80 ft Assistive device utilized: None Comments: Antalgic gait with forward flexed trunk and hips     TODAY'S TREATMENT  Continued back education  Exercises: Prone: Prone prop as tolerated 2 min Glut set 10 sec x 10 prone Quad stretch prone with strap 30 sec x 3 reps and PT assist with hip extension as tolerated    Sitting  Hip flexor stretch 30 sec x 2 each side   Supine  Core contraction 10 sec  Diaphragmatic breathing - handout for home  Hip abduction alternating LE's green TB   Standing  Back at wall working on posture with core contraction   Dry Needling:  Information provided previously DN; Lt  lumbar paraspinals E-stim with DN mAmp x 5 min  (Awaiting longer needles to DN QL)  Manual:STM Lt hip flexors pt supine and LB patient prone   Modalities:  Moist heat x 10 min LB and Lt hip flexors   PATIENT EDUCATION:  Education details: Posture, alignment; HEP; POC; DN; TENS for home which pt has  Person educated: Patient Education method: Explanation, Demonstration, Tactile cues, Verbal cues, and Handouts Education comprehension: verbalized understanding, returned demonstration, verbal cues required, tactile cues required, and needs further education   HOME EXERCISE PROGRAM: Access Code: [email or web address] URL: [email or web address] Date: 04/23/2022 Prepared by: Gillermo Murdoch  Exercises - Prone Press Up On Elbows  - 2 x daily -  7 x weekly - 1 sets - 1 reps - 2-4 min  hold - Prone Gluteal Sets  - 2 x daily - 7 x weekly - 1 sets - 10 reps - 10 sec  hold - Prone Quadriceps Stretch with Strap  - 2 x daily - 7 x weekly - 1 sets - 3 reps - 30 sec  hold - Seated Hip Flexor Stretch  - 2 x daily - 7 x weekly - 1 sets - 3 reps - 30 sec  hold - Supine Transversus Abdominis Bracing with Pelvic Floor Contraction  - 2 x daily - 7 x weekly - 1 sets - 10 reps - 10sec  hold - Supine Diaphragmatic Breathing  - 2 x daily - 7 x weekly - 1 sets - 10 reps - 4-6 sec  hold - Standing Hip Extension with Counter Support  - 2 x daily - 7 x weekly - 1-2 sets - 10 reps - 3-5 sec  hold - Standing Piriformis Release with Ball at [employer]  - 2 x daily - 7 x weekly - 30-60 sec  hold - Hooklying Isometric Clamshell  - 2 x daily - 7 x weekly - 1 sets -  10 reps - 3 sec  hold    ASSESSMENT:  CLINICAL IMPRESSION:  04/23/22: Patient was out of town over the weekend and very active with no time or energy for exercises and has not used his TENS unit. He has guarded movement, posture and gait noted. Patient has continued muscular tightness in the Lt lower quarter with tightness in the QL/lats/lumbar paraspinals and hip flexors. Continued treatment including DN and manual work.   Eval: Patient is a 71 y.o. male who was seen today for physical therapy evaluation and treatment for acute flare up of chronic LBP. Patient has poor posture and alignment; limited trunk and LE mobility, ROM, strength; abnormal gait pattern; pain on a constant basis; limited functional and recreational activity level. He will benefit from PT to address problems identified.    OBJECTIVE IMPAIRMENTS Abnormal gait, decreased activity tolerance, decreased balance, decreased endurance, decreased mobility, decreased ROM, decreased strength, hypomobility, increased fascial restrictions, impaired flexibility, improper body mechanics, postural dysfunction, obesity, and pain.   ACTIVITY LIMITATIONS  carrying, lifting, bending, sitting, standing, squatting, transfers, and bed mobility  PARTICIPATION LIMITATIONS: community activity and yard work  [place], Past/current experiences, and 1-2 comorbidities: HTN; Rt THA; prediabetes  are also affecting patient's functional outcome.   REHAB POTENTIAL: Good  CLINICAL DECISION MAKING: Stable/uncomplicated  EVALUATION COMPLEXITY: Low   GOALS: Goals reviewed with patient? Yes   LONG TERM GOALS: Target date: 06/04/2022  Improve posture and alignment with patient to demonstrate neutral spinal and hip alignment in standing Baseline:  Goal status: INITIAL  2.  Decrease back pain by 50-75% allowing patient to participate in normal functional activities with less pain and limitation  Baseline:  Goal status: INITIAL  3.  Increase spinal extension to 10-20% range in standing  Baseline:  Goal status: INITIAL  4.  Independent in HEP (including aquatic program) Baseline:  Goal status: INITIAL  5.  Improve functional limitation score to 54 Baseline:  Goal status: INITIAL    PLAN: PT FREQUENCY: 2x/wk  PT DURATION: 6 weeks  PLANNED INTERVENTIONS: Therapeutic exercises, Therapeutic activity, Neuromuscular re-education, Balance training, Gait training, Patient/Family education, Self Care, Joint mobilization, Aquatic Therapy, Dry Needling, Electrical stimulation, Spinal mobilization, Cryotherapy, Moist heat, Taping, Manual therapy, and Re-evaluation.  PLAN FOR NEXT SESSION: review and progress with HEP; continue with trial of DN to Lt lumbar spine; continue education and home instruction; modalities as indicated.   Yanil Dawe Nilda Simmer, PT, MPH  04/23/2022, 8:50 AM  There were no vitals filed for this visit.                    Objective measurements completed on examination: See above findings.                              Patient will benefit from skilled therapeutic intervention in  order to improve the following deficits and impairments:     Visit Diagnosis: Other low back pain  Other symptoms and signs involving the musculoskeletal system     Problem List Patient Active Problem List   Diagnosis Date Noted   Acute pancreatitis 04/07/2022   OSA (obstructive sleep apnea) 02/19/2022   Gout 12/24/2021   Chronic rhinitis 08/21/2021   Impotence of organic origin 02/13/2021   Lumbar spondylosis 10/09/2020   Pre-operative exam 07/18/2020   Verruca 05/01/2020   Epiploic appendagitis 02/20/2020   Bilateral shoulder region arthritis 04/13/2019   Vitamin D deficiency 02/08/2019  Insulin resistance 02/08/2019   Class 2 severe obesity with serious comorbidity and body mass index (BMI) of 39.0 to 39.9 in adult ([place]) 02/08/2019   Prediabetes 02/08/2019   Chronic pain 09/28/2018   Hip pain 06/28/2018   Dependent edema 05/05/2017   Primary osteoarthritis of left knee 09/25/2016   BPH (benign prostatic hyperplasia) 05/29/2016   Urine frequency 05/29/2016   HTN (hypertension) 05/29/2016   HLD (hyperlipidemia) 05/29/2016   Class 2 obesity due to excess calories without serious comorbidity with body mass index (BMI) of 39.0 to 39.9 in adult 05/29/2016   Sleep apnea 03/31/2014    Elinore Shults Nilda Simmer, PT, MPH  04/23/2022, 8:50 AM  [facility] [address] [place], [place], [address] Phone: [phone]   Fax:  [phone]  Name: John Morrow MRN: [id number] Date of Birth: Feb 12, 1951 [address] [place], [place], [address] Phone: [phone]   Fax:  [phone]  Physical Therapy Treatment  Patient Details  Name: John Morrow MRN: [id number] Date of Birth: 10-28-1950 No data recorded  Encounter Date: 04/23/2022   PT End of Session - 04/23/22 0806     Visit Number 3    Number of Visits 12    Date for PT Re-Evaluation 05/28/22    PT  Start Time 0800    PT Stop Time 0848    PT Time Calculation (min) 48 min    Activity Tolerance Patient tolerated treatment well             Past Medical History:  Diagnosis Date   Arthritis    Back pain    Diverticulosis of colon without hemorrhage 06/12/2016   On Colonoscopy 11/06/2010   HLD (hyperlipidemia) 05/29/2016   Hyperlipemia    Hypertension    Joint pain    Morbid obesity ([place]) 05/29/2016   Prediabetes    Right hip pain    needs hip replacement   Sleep apnea    on CPAP    Past Surgical History:  Procedure Laterality Date   KNEE SURGERY     REPLACEMENT TOTAL HIP W/  RESURFACING IMPLANTS Right 35/3614   Dr. Aleda Grana    There were no vitals filed for this visit.                                            Patient will benefit from skilled therapeutic intervention in order to improve the following deficits and impairments:     Visit Diagnosis: Other low back pain  Other symptoms and signs involving the musculoskeletal system     Problem List Patient Active Problem List   Diagnosis Date Noted   Acute pancreatitis 04/07/2022   OSA (obstructive sleep apnea) 02/19/2022   Gout 12/24/2021   Chronic rhinitis 08/21/2021   Impotence of organic origin 02/13/2021   Lumbar spondylosis 10/09/2020   Pre-operative exam 07/18/2020   Verruca 05/01/2020   Epiploic appendagitis 02/20/2020   Bilateral shoulder region arthritis 04/13/2019   Vitamin D deficiency 02/08/2019   Insulin resistance 02/08/2019   Class 2 severe obesity with serious comorbidity and body mass index (BMI) of 39.0 to 39.9 in adult [facility]) 02/08/2019   Prediabetes 02/08/2019   Chronic pain 09/28/2018   Hip pain 06/28/2018   Dependent edema 05/05/2017   Primary osteoarthritis of left knee 09/25/2016   BPH (benign prostatic hyperplasia) 05/29/2016  Urine frequency 05/29/2016   HTN (hypertension) 05/29/2016   HLD (hyperlipidemia) 05/29/2016   Class 2  obesity due to excess calories without serious comorbidity with body mass index (BMI) of 39.0 to 39.9 in adult 05/29/2016   Sleep apnea 03/31/2014    Duchess Armendarez Nilda Simmer, PT 04/23/2022, 8:50 AM  [facility] [place] [place] [address] [place], [place], [address] Phone: [phone]   Fax:  [phone]  Name: John Morrow MRN: [id number] Date of Birth: 08-08-1951  [address], [place], [address] Phone: [phone]   Fax:  [phone]  Physical Therapy Treatment  Patient Details  Name: John Morrow MRN: [id number] Date of Birth: 1951/02/12 No data recorded  Encounter Date: 04/23/2022   PT End of Session - 04/23/22 0806     Visit Number 3    Number of Visits 12    Date for PT Re-Evaluation 05/28/22    PT Start Time 0800    PT Stop Time 0848    PT Time Calculation (min) 48 min    Activity Tolerance Patient tolerated treatment well             Past Medical History:  Diagnosis Date   Arthritis    Back pain    Diverticulosis of colon without hemorrhage 06/12/2016   On Colonoscopy 11/06/2010   HLD (hyperlipidemia) 05/29/2016   Hyperlipemia    Hypertension    Joint pain    Morbid obesity ([place]) 05/29/2016   Prediabetes    Right hip pain    needs hip replacement   Sleep apnea    on CPAP    Past Surgical History:  Procedure Laterality Date   KNEE SURGERY     REPLACEMENT TOTAL HIP W/  RESURFACING IMPLANTS Right 70/4888   Dr. Aleda Grana    There were no vitals filed for this visit.                                            Patient will benefit from skilled therapeutic intervention in order to improve the following deficits and impairments:     Visit Diagnosis: Other low back pain  Other symptoms and signs involving the musculoskeletal system     Problem List Patient Active Problem List   Diagnosis  Date Noted   Acute pancreatitis 04/07/2022   OSA (obstructive sleep apnea) 02/19/2022   Gout 12/24/2021   Chronic rhinitis 08/21/2021   Impotence of organic origin 02/13/2021   Lumbar spondylosis 10/09/2020   Pre-operative exam 07/18/2020   Verruca 05/01/2020   Epiploic appendagitis 02/20/2020   Bilateral shoulder region arthritis 04/13/2019   Vitamin D deficiency 02/08/2019   Insulin resistance 02/08/2019   Class 2 severe obesity with serious comorbidity and body mass index (BMI) of 39.0 to 39.9 in adult ([place]) 02/08/2019   Prediabetes 02/08/2019   Chronic pain 09/28/2018   Hip pain 06/28/2018   Dependent edema 05/05/2017   Primary osteoarthritis of left knee 09/25/2016   BPH (benign prostatic hyperplasia) 05/29/2016   Urine frequency 05/29/2016   HTN (hypertension) 05/29/2016   HLD (hyperlipidemia) 05/29/2016   Class 2 obesity due to excess calories without serious comorbidity with body mass index (BMI) of 39.0 to 39.9 in adult 05/29/2016   Sleep apnea 03/31/2014    [place], PT 04/23/2022, 8:50 AM  [facility] [place], [place], [address] Phone: [phone]   Fax:  [phone]  Name: John Morrow MRN: [id number] Date of Birth: 1951/03/18

## 2022-04-25 ENCOUNTER — Encounter: Payer: Self-pay | Admitting: Physical Therapy

## 2022-04-25 ENCOUNTER — Ambulatory Visit: Payer: Medicare PPO | Admitting: Physical Therapy

## 2022-04-25 DIAGNOSIS — M5459 Other low back pain: Secondary | ICD-10-CM | POA: Diagnosis not present

## 2022-04-25 DIAGNOSIS — R29898 Other symptoms and signs involving the musculoskeletal system: Secondary | ICD-10-CM

## 2022-04-25 DIAGNOSIS — M545 Low back pain, unspecified: Secondary | ICD-10-CM | POA: Diagnosis not present

## 2022-04-25 NOTE — Therapy (Signed)
OUTPATIENT PHYSICAL THERAPY TREATMENT   Patient Name: John Morrow MRN: [id number] DOB:1951/03/27, 71 y.o., male Today's Date: 04/25/2022   PT End of Session - 04/25/22 0847     Visit Number 4    Number of Visits 12    Date for PT Re-Evaluation 05/28/22    PT Start Time 0847    PT Stop Time 0930    PT Time Calculation (min) 43 min    Activity Tolerance Patient tolerated treatment well              Past Medical History:  Diagnosis Date   Arthritis    Back pain    Diverticulosis of colon without hemorrhage 06/12/2016   On Colonoscopy 11/06/2010   HLD (hyperlipidemia) 05/29/2016   Hyperlipemia    Hypertension    Joint pain    Morbid obesity ([place]) 05/29/2016   Prediabetes    Right hip pain    needs hip replacement   Sleep apnea    on CPAP   Past Surgical History:  Procedure Laterality Date   KNEE SURGERY     REPLACEMENT TOTAL HIP W/  RESURFACING IMPLANTS Right 23/7628   Dr. Aleda Grana   Patient Active Problem List   Diagnosis Date Noted   Acute pancreatitis 04/07/2022   OSA (obstructive sleep apnea) 02/19/2022   Gout 12/24/2021   Chronic rhinitis 08/21/2021   Impotence of organic origin 02/13/2021   Lumbar spondylosis 10/09/2020   Pre-operative exam 07/18/2020   Verruca 05/01/2020   Epiploic appendagitis 02/20/2020   Bilateral shoulder region arthritis 04/13/2019   Vitamin D deficiency 02/08/2019   Insulin resistance 02/08/2019   Class 2 severe obesity with serious comorbidity and body mass index (BMI) of 39.0 to 39.9 in adult ([place]) 02/08/2019   Prediabetes 02/08/2019   Chronic pain 09/28/2018   Hip pain 06/28/2018   Dependent edema 05/05/2017   Primary osteoarthritis of left knee 09/25/2016   BPH (benign prostatic hyperplasia) 05/29/2016   Urine frequency 05/29/2016   HTN (hypertension) 05/29/2016   HLD (hyperlipidemia) 05/29/2016   Class 2 obesity due to excess calories without serious comorbidity with body mass index (BMI) of 39.0 to 39.9 in  adult 05/29/2016   Sleep apnea 03/31/2014    PCP: Dr Luetta Nutting  REFERRING PROVIDER: Dr Luetta Nutting  REFERRING DIAG: Acute LBP  Rationale for Evaluation and Treatment Rehabilitation  THERAPY DIAG:  Other low back pain  Other symptoms and signs involving the musculoskeletal system  ONSET DATE: 04/07/22; history of LBP for ~ 5 years   SUBJECTIVE:  SUBJECTIVE STATEMENT: Pt states he's been better able to do the exercises this session. Has not felt too much of a difference yet. Has been stretching, and doing the TENS.    PERTINENT HISTORY:  HTN; hyperlipemia; Rt THA; obesity; back pain; prediabetes; sleep apnea;   PAIN:  Are you having pain? Yes: NPRS scale: 6/10 Pain location:  Lt > Rt low back Pain description: sharp Aggravating factors: standing; walking; steps; lifting Relieving factors: sitting; not moving   PRECAUTIONS: None  WEIGHT BEARING RESTRICTIONS No  FALLS:  Has patient fallen in last 6 months? Yes. Number of falls 1  LIVING ENVIRONMENT: Lives with: lives with their family Lives in: House/apartment Stairs: No Has following equipment at home:   OCCUPATION: retired from [employer]; cares for grandchildren; minimal activities; walking ~ 2 days/wk walking dog 20-30 min   PLOF: Independent  PATIENT GOALS pain free;    OBJECTIVE:   PATIENT SURVEYS:  FOTO 44  MUSCLE LENGTH: Hamstrings: tight Rt ~ 60 deg; Lt 65 deg Hip flexors tight Rt > Lt unable to tolerate [employer] test  POSTURE: rounded shoulders, forward head, decreased lumbar lordosis, increased thoracic kyphosis, posterior pelvic tilt, and flexed trunk   PALPATION: Muscular tightness Lt QL, lats, lumbar paraspinals, iliopsoas  LUMBAR ROM:   Active  A/PROM  eval  Flexion 45%  Extension -10%  Right lateral  flexion 35%  Left lateral flexion 30%  Right rotation 15%  Left rotation 10%   (Blank rows = not tested)  LOWER EXTREMITY ROM:     Active  Right eval Left eval  Hip flexion tight tight  Hip extension -10 -10  Hip abduction tight tight  Hip adduction    Hip internal rotation    Hip external rotation    Knee flexion 90 90  Knee extension WNL's WNL's  Ankle dorsiflexion    Ankle plantarflexion    Ankle inversion    Ankle eversion     (Blank rows = not tested)  LOWER EXTREMITY MMT:    MMT Right eval Left eval  Hip flexion 4/5 4/5  Hip extension 4-/5 4/4  Hip abduction 4-/5 4-/5  Hip adduction    Hip internal rotation    Hip external rotation    Knee flexion 5/5 5/5  Knee extension 5/5 5/5  Ankle dorsiflexion    Ankle plantarflexion    Ankle inversion    Ankle eversion     (Blank rows = not tested)  LUMBAR SPECIAL TESTS:  Straight leg raise test: Negative and Slump test: Negative    GAIT: Distance walked: 80 ft Assistive device utilized: None Comments: Antalgic gait with forward flexed trunk and hips     TODAY'S TREATMENT  04/25/22 Therex: Sitting  hip flexor stretch 2x30 sec  Prone: Quad stretch with strap 2x30 sec, towel under knee for increased hip ext Hip ext knee bent 2x10 with AAROM (cues to keep left hip from rotating)  Supine: Low trunk rotation 3x10 sec Ab set 3x10 sec Ab set + clam shell green tband alternating 2x10  Manual therapy: Contract/relax lats and quad/hip flexors Hip ext PROM LAD R & L hip x30 sec    04/23/22 Continued back education  Exercises: Prone: Prone prop as tolerated 2 min Glut set 10 sec x 10 prone Quad stretch prone with strap 30 sec x 3 reps and PT assist with hip extension as tolerated    Sitting  Hip flexor stretch 30 sec x 2 each side   Supine  Core contraction 10  sec  Diaphragmatic breathing - handout for home  Hip abduction alternating LE's green TB   Standing  Back at wall working on posture  with core contraction   Dry Needling:  Information provided previously DN; Lt  lumbar paraspinals E-stim with DN mAmp x 5 min  (Awaiting longer needles to DN QL)  Manual:STM Lt hip flexors pt supine and LB patient prone   Modalities:  Moist heat x 10 min LB and Lt hip flexors   PATIENT EDUCATION:  Education details: Posture, alignment; HEP; POC; DN; TENS for home which pt has  Person educated: Patient Education method: Explanation, Demonstration, Tactile cues, Verbal cues, and Handouts Education comprehension: verbalized understanding, returned demonstration, verbal cues required, tactile cues required, and needs further education   HOME EXERCISE PROGRAM: Access Code: [email or web address] URL: [email or web address] Date: 04/23/2022 Prepared by: Gillermo Murdoch  Exercises - Prone Press Up On Elbows  - 2 x daily - 7 x weekly - 1 sets - 1 reps - 2-4 min  hold - Prone Gluteal Sets  - 2 x daily - 7 x weekly - 1 sets - 10 reps - 10 sec  hold - Prone Quadriceps Stretch with Strap  - 2 x daily - 7 x weekly - 1 sets - 3 reps - 30 sec  hold - Seated Hip Flexor Stretch  - 2 x daily - 7 x weekly - 1 sets - 3 reps - 30 sec  hold - Supine Transversus Abdominis Bracing with Pelvic Floor Contraction  - 2 x daily - 7 x weekly - 1 sets - 10 reps - 10sec  hold - Supine Diaphragmatic Breathing  - 2 x daily - 7 x weekly - 1 sets - 10 reps - 4-6 sec  hold - Standing Hip Extension with Counter Support  - 2 x daily - 7 x weekly - 1-2 sets - 10 reps - 3-5 sec  hold - Standing Piriformis Release with Ball at [employer]  - 2 x daily - 7 x weekly - 30-60 sec  hold - Hooklying Isometric Clamshell  - 2 x daily - 7 x weekly - 1 sets - 10 reps - 3 sec  hold    ASSESSMENT:  CLINICAL IMPRESSION: 04/25/22: Session focused primarily on stretching hip flexors, quads, and lats. Continued core and hip extensor strengthening.   04/23/22: Patient was out of town over the weekend and very active with no time or energy for  exercises and has not used his TENS unit. He has guarded movement, posture and gait noted. Patient has continued muscular tightness in the Lt lower quarter with tightness in the QL/lats/lumbar paraspinals and hip flexors. Continued treatment including DN and manual work.    OBJECTIVE IMPAIRMENTS Abnormal gait, decreased activity tolerance, decreased balance, decreased endurance, decreased mobility, decreased ROM, decreased strength, hypomobility, increased fascial restrictions, impaired flexibility, improper body mechanics, postural dysfunction, obesity, and pain.   ACTIVITY LIMITATIONS carrying, lifting, bending, sitting, standing, squatting, transfers, and bed mobility  PARTICIPATION LIMITATIONS: community activity and yard work  [place], Past/current experiences, and 1-2 comorbidities: HTN; Rt THA; prediabetes  are also affecting patient's functional outcome.   REHAB POTENTIAL: Good  CLINICAL DECISION MAKING: Stable/uncomplicated  EVALUATION COMPLEXITY: Low   GOALS: Goals reviewed with patient? Yes   LONG TERM GOALS: Target date: 06/06/2022  Improve posture and alignment with patient to demonstrate neutral spinal and hip alignment in standing Baseline:  Goal status: INITIAL  2.  Decrease back pain by 50-75% allowing patient to participate in  normal functional activities with less pain and limitation  Baseline:  Goal status: INITIAL  3.  Increase spinal extension to 10-20% range in standing  Baseline:  Goal status: INITIAL  4.  Independent in HEP (including aquatic program) Baseline:  Goal status: INITIAL  5.  Improve functional limitation score to 54 Baseline:  Goal status: INITIAL    PLAN: PT FREQUENCY: 2x/wk  PT DURATION: 6 weeks  PLANNED INTERVENTIONS: Therapeutic exercises, Therapeutic activity, Neuromuscular re-education, Balance training, Gait training, Patient/Family education, Self Care, Joint mobilization, Aquatic Therapy, Dry Needling,  Electrical stimulation, Spinal mobilization, Cryotherapy, Moist heat, Taping, Manual therapy, and Re-evaluation.  PLAN FOR NEXT SESSION: review and progress with HEP; continue with trial of DN to Lt lumbar spine when longer needles arrie; continue education and home instruction; modalities as indicated.   Esma Kilts [place], PT, DPT  [facility] [place], [place], [address] Phone: [phone]   Fax:  [phone]  Name: Keaton Beichner MRN: [id number] Date of Birth: November 12, 1950

## 2022-04-30 ENCOUNTER — Ambulatory Visit: Payer: Medicare PPO | Admitting: Rehabilitative and Restorative Service Providers"

## 2022-04-30 ENCOUNTER — Encounter: Payer: Self-pay | Admitting: Rehabilitative and Restorative Service Providers"

## 2022-04-30 ENCOUNTER — Other Ambulatory Visit: Payer: Self-pay

## 2022-04-30 DIAGNOSIS — M545 Low back pain, unspecified: Secondary | ICD-10-CM | POA: Diagnosis not present

## 2022-04-30 DIAGNOSIS — R29898 Other symptoms and signs involving the musculoskeletal system: Secondary | ICD-10-CM

## 2022-04-30 DIAGNOSIS — M5459 Other low back pain: Secondary | ICD-10-CM | POA: Diagnosis not present

## 2022-04-30 NOTE — Therapy (Signed)
OUTPATIENT PHYSICAL THERAPY TREATMENT   Patient Name: John Morrow MRN: [id number] DOB:1951/06/04, 71 y.o., male Today's Date: 04/30/2022   PT End of Session - 04/30/22 0933     Visit Number 5    Number of Visits 12    Date for PT Re-Evaluation 05/28/22    PT Start Time 0932    PT Stop Time 1017    PT Time Calculation (min) 45 min    Activity Tolerance Patient tolerated treatment well              Past Medical History:  Diagnosis Date   Arthritis    Back pain    Diverticulosis of colon without hemorrhage 06/12/2016   On Colonoscopy 11/06/2010   HLD (hyperlipidemia) 05/29/2016   Hyperlipemia    Hypertension    Joint pain    Morbid obesity ([place]) 05/29/2016   Prediabetes    Right hip pain    needs hip replacement   Sleep apnea    on CPAP   Past Surgical History:  Procedure Laterality Date   KNEE SURGERY     REPLACEMENT TOTAL HIP W/  RESURFACING IMPLANTS Right 74/8270   Dr. Aleda Grana   Patient Active Problem List   Diagnosis Date Noted   Acute pancreatitis 04/07/2022   OSA (obstructive sleep apnea) 02/19/2022   Gout 12/24/2021   Chronic rhinitis 08/21/2021   Impotence of organic origin 02/13/2021   Lumbar spondylosis 10/09/2020   Pre-operative exam 07/18/2020   Verruca 05/01/2020   Epiploic appendagitis 02/20/2020   Bilateral shoulder region arthritis 04/13/2019   Vitamin D deficiency 02/08/2019   Insulin resistance 02/08/2019   Class 2 severe obesity with serious comorbidity and body mass index (BMI) of 39.0 to 39.9 in adult ([place]) 02/08/2019   Prediabetes 02/08/2019   Chronic pain 09/28/2018   Hip pain 06/28/2018   Dependent edema 05/05/2017   Primary osteoarthritis of left knee 09/25/2016   BPH (benign prostatic hyperplasia) 05/29/2016   Urine frequency 05/29/2016   HTN (hypertension) 05/29/2016   HLD (hyperlipidemia) 05/29/2016   Class 2 obesity due to excess calories without serious comorbidity with body mass index (BMI) of 39.0 to 39.9 in  adult 05/29/2016   Sleep apnea 03/31/2014    PCP: Dr Luetta Nutting  REFERRING PROVIDER: Dr Luetta Nutting  REFERRING DIAG: Acute LBP  Rationale for Evaluation and Treatment Rehabilitation  THERAPY DIAG:  Other low back pain  Other symptoms and signs involving the musculoskeletal system  ONSET DATE: 04/07/22; history of LBP for ~ 5 years   SUBJECTIVE:  SUBJECTIVE STATEMENT: Exercises are going well with exercises. His back is better some days than others. Has been using the TENS. He is using a pillow behind his back in the recliner and feels that is helpful.    PERTINENT HISTORY:  HTN; hyperlipemia; Rt THA; obesity; back pain; prediabetes; sleep apnea;   PAIN:  Are you having pain? Yes: NPRS scale: 4/10 Pain location:  Lt > Rt low back Pain description: aching dull  Aggravating factors: standing; walking; steps; lifting Relieving factors: sitting; not moving   PRECAUTIONS: None  WEIGHT BEARING RESTRICTIONS No  FALLS:  Has patient fallen in last 6 months? Yes. Number of falls 1  LIVING ENVIRONMENT: Lives with: lives with their family Lives in: House/apartment Stairs: No Has following equipment at home:   OCCUPATION: retired from [employer]; cares for grandchildren; minimal activities; walking ~ 2 days/wk walking dog 20-30 min   PLOF: Independent  PATIENT GOALS pain free;    OBJECTIVE:   PATIENT SURVEYS:  FOTO 44  MUSCLE LENGTH: Hamstrings: tight Rt ~ 60 deg; Lt 65 deg Hip flexors tight Rt > Lt unable to tolerate [employer] test  POSTURE: rounded shoulders, forward head, decreased lumbar lordosis, increased thoracic kyphosis, posterior pelvic tilt, and flexed trunk   PALPATION: Muscular tightness Lt QL, lats, lumbar paraspinals, iliopsoas  LUMBAR ROM:   Active  A/PROM  eval   Flexion 45%  Extension -10%  Right lateral flexion 35%  Left lateral flexion 30%  Right rotation 15%  Left rotation 10%   (Blank rows = not tested)  LOWER EXTREMITY ROM:     Active  Right eval Left eval  Hip flexion tight tight  Hip extension -10 -10  Hip abduction tight tight  Hip adduction    Hip internal rotation    Hip external rotation    Knee flexion 90 90  Knee extension WNL's WNL's  Ankle dorsiflexion    Ankle plantarflexion    Ankle inversion    Ankle eversion     (Blank rows = not tested)  LOWER EXTREMITY MMT:    MMT Right eval Left eval  Hip flexion 4/5 4/5  Hip extension 4-/5 4/4  Hip abduction 4-/5 4-/5  Hip adduction    Hip internal rotation    Hip external rotation    Knee flexion 5/5 5/5  Knee extension 5/5 5/5  Ankle dorsiflexion    Ankle plantarflexion    Ankle inversion    Ankle eversion     (Blank rows = not tested)  LUMBAR SPECIAL TESTS:  Straight leg raise test: Negative and Slump test: Negative    GAIT: Distance walked: 80 ft Assistive device utilized: None Comments: Antalgic gait with forward flexed trunk and hips     TODAY'S TREATMENT  04/30/22 Therex: Nustep L5 x 5 min  Sitting  hip flexor stretch 2x30 sec  Prone: Quad stretch with strap 2x30 sec, towel under knee for increased hip ext Hip ext knee bent 2x10 with AAROM (cues to keep left hip from rotating)  Supine: Low trunk rotation 3x10 sec Ab set 3x10 sec Ab set + clam shell green tband alternating 2x10  Manual therapy: Contract/relax lats and quad/hip flexors Hip ext PROM LAD R & L hip x30 sec    04/23/22 Continued back education  Exercises: Prone: Prone press up x 5 reps  Prone prop as tolerated 2 min Glut set 10 sec x 10 prone Quad stretch prone with strap 30 sec x 3 reps and PT assist with hip extension as  tolerated    Sitting:  Hip flexor stretch 30 sec x 2 each side   Supine:  Core contraction 10 sec  Diaphragmatic breathing - handout  for home  Hip abduction alternating LE's green TB   Standing:  Back at wall working on posture with core contraction   Dry Needling:  Information provided previously DN; Lt  lumbar paraspinals; QL E-stim with DN mAmp x 5 min  (Awaiting longer needles to DN QL)   Manual:STM Lt hip flexors pt supine and LB patient prone   Modalities:  Moist heat x 10 min LB   PATIENT EDUCATION:  Education details: Posture, alignment; HEP; POC; DN; TENS for home which pt has  Person educated: Patient Education method: Explanation, Demonstration, Tactile cues, Verbal cues, and Handouts Education comprehension: verbalized understanding, returned demonstration, verbal cues required, tactile cues required, and needs further education   HOME EXERCISE PROGRAM: Access Code: [email or web address] URL: [email or web address] Date: 04/23/2022 Prepared by: Gillermo Murdoch  Exercises - Prone Press Up On Elbows  - 2 x daily - 7 x weekly - 1 sets - 1 reps - 2-4 min  hold - Prone Gluteal Sets  - 2 x daily - 7 x weekly - 1 sets - 10 reps - 10 sec  hold - Prone Quadriceps Stretch with Strap  - 2 x daily - 7 x weekly - 1 sets - 3 reps - 30 sec  hold - Seated Hip Flexor Stretch  - 2 x daily - 7 x weekly - 1 sets - 3 reps - 30 sec  hold - Supine Transversus Abdominis Bracing with Pelvic Floor Contraction  - 2 x daily - 7 x weekly - 1 sets - 10 reps - 10sec  hold - Supine Diaphragmatic Breathing  - 2 x daily - 7 x weekly - 1 sets - 10 reps - 4-6 sec  hold - Standing Hip Extension with Counter Support  - 2 x daily - 7 x weekly - 1-2 sets - 10 reps - 3-5 sec  hold - Standing Piriformis Release with Ball at [employer]  - 2 x daily - 7 x weekly - 30-60 sec  hold - Hooklying Isometric Clamshell  - 2 x daily - 7 x weekly - 1 sets - 10 reps - 3 sec  hold    ASSESSMENT:  CLINICAL IMPRESSION:  04/30/22: Patient demonstrates improving gait pattern with more upright posture and alignment. Trial of DN to QL and lumbar paraspinals  to release deep tightness. Continued with stretching. Needs to progress with core stabilization and strengthening as tolerated.    04/25/22: Session focused primarily on stretching hip flexors, quads, and lats. Continued core and hip extensor strengthening.   04/23/22: Patient was out of town over the weekend and very active with no time or energy for exercises and has not used his TENS unit. He has guarded movement, posture and gait noted. Patient has continued muscular tightness in the Lt lower quarter with tightness in the QL/lats/lumbar paraspinals and hip flexors. Continued treatment including DN and manual work.    OBJECTIVE IMPAIRMENTS Abnormal gait, decreased activity tolerance, decreased balance, decreased endurance, decreased mobility, decreased ROM, decreased strength, hypomobility, increased fascial restrictions, impaired flexibility, improper body mechanics, postural dysfunction, obesity, and pain.   ACTIVITY LIMITATIONS carrying, lifting, bending, sitting, standing, squatting, transfers, and bed mobility  PARTICIPATION LIMITATIONS: community activity and yard work  [place], Past/current experiences, and 1-2 comorbidities: HTN; Rt THA; prediabetes  are also affecting patient's functional outcome.   REHAB  POTENTIAL: Good  CLINICAL DECISION MAKING: Stable/uncomplicated  EVALUATION COMPLEXITY: Low   GOALS: Goals reviewed with patient? Yes   LONG TERM GOALS: Target date: 06/06/2022  Improve posture and alignment with patient to demonstrate neutral spinal and hip alignment in standing Baseline:  Goal status: INITIAL  2.  Decrease back pain by 50-75% allowing patient to participate in normal functional activities with less pain and limitation  Baseline:  Goal status: INITIAL  3.  Increase spinal extension to 10-20% range in standing  Baseline:  Goal status: INITIAL  4.  Independent in HEP (including aquatic program) Baseline:  Goal status: INITIAL  5.   Improve functional limitation score to 54 Baseline:  Goal status: INITIAL    PLAN: PT FREQUENCY: 2x/wk  PT DURATION: 6 weeks  PLANNED INTERVENTIONS: Therapeutic exercises, Therapeutic activity, Neuromuscular re-education, Balance training, Gait training, Patient/Family education, Self Care, Joint mobilization, Aquatic Therapy, Dry Needling, Electrical stimulation, Spinal mobilization, Cryotherapy, Moist heat, Taping, Manual therapy, and Re-evaluation.  PLAN FOR NEXT SESSION: review and progress with HEP; progress core stabilization; continue with trial of DN to Lt lumbar spin; continue education and home instruction; modalities as indicated.   [place], PT, MPH  [facility] [place], [place], [address] Phone: [phone]   Fax:  [phone]  Name: John Morrow MRN: [id number] Date of Birth: 1950-10-09

## 2022-05-02 ENCOUNTER — Ambulatory Visit: Payer: Medicare PPO | Admitting: Rehabilitative and Restorative Service Providers"

## 2022-05-02 ENCOUNTER — Encounter: Payer: Self-pay | Admitting: Rehabilitative and Restorative Service Providers"

## 2022-05-02 DIAGNOSIS — M5459 Other low back pain: Secondary | ICD-10-CM | POA: Diagnosis not present

## 2022-05-02 DIAGNOSIS — R29898 Other symptoms and signs involving the musculoskeletal system: Secondary | ICD-10-CM | POA: Diagnosis not present

## 2022-05-02 DIAGNOSIS — M545 Low back pain, unspecified: Secondary | ICD-10-CM | POA: Diagnosis not present

## 2022-05-02 NOTE — Therapy (Signed)
OUTPATIENT PHYSICAL THERAPY TREATMENT   Patient Name: John Morrow MRN: [id number] DOB:07/30/1951, 71 y.o., male Today's Date: 05/02/2022   PT End of Session - 05/02/22 0850     Visit Number 6    Number of Visits 12    Date for PT Re-Evaluation 05/28/22    PT Start Time 0847    PT Stop Time 0928    PT Time Calculation (min) 41 min    Activity Tolerance Patient tolerated treatment well              Past Medical History:  Diagnosis Date   Arthritis    Back pain    Diverticulosis of colon without hemorrhage 06/12/2016   On Colonoscopy 11/06/2010   HLD (hyperlipidemia) 05/29/2016   Hyperlipemia    Hypertension    Joint pain    Morbid obesity ([place]) 05/29/2016   Prediabetes    Right hip pain    needs hip replacement   Sleep apnea    on CPAP   Past Surgical History:  Procedure Laterality Date   KNEE SURGERY     REPLACEMENT TOTAL HIP W/  RESURFACING IMPLANTS Right 10/7739   Dr. Aleda Grana   Patient Active Problem List   Diagnosis Date Noted   Acute pancreatitis 04/07/2022   OSA (obstructive sleep apnea) 02/19/2022   Gout 12/24/2021   Chronic rhinitis 08/21/2021   Impotence of organic origin 02/13/2021   Lumbar spondylosis 10/09/2020   Pre-operative exam 07/18/2020   Verruca 05/01/2020   Epiploic appendagitis 02/20/2020   Bilateral shoulder region arthritis 04/13/2019   Vitamin D deficiency 02/08/2019   Insulin resistance 02/08/2019   Class 2 severe obesity with serious comorbidity and body mass index (BMI) of 39.0 to 39.9 in adult ([place]) 02/08/2019   Prediabetes 02/08/2019   Chronic pain 09/28/2018   Hip pain 06/28/2018   Dependent edema 05/05/2017   Primary osteoarthritis of left knee 09/25/2016   BPH (benign prostatic hyperplasia) 05/29/2016   Urine frequency 05/29/2016   HTN (hypertension) 05/29/2016   HLD (hyperlipidemia) 05/29/2016   Class 2 obesity due to excess calories without serious comorbidity with body mass index (BMI) of 39.0 to 39.9 in  adult 05/29/2016   Sleep apnea 03/31/2014    PCP: Dr Luetta Nutting  REFERRING PROVIDER: Dr Luetta Nutting  REFERRING DIAG: Acute LBP  Rationale for Evaluation and Treatment Rehabilitation  THERAPY DIAG:  Other low back pain  Other symptoms and signs involving the musculoskeletal system  ONSET DATE: 04/07/22; history of LBP for ~ 5 years   SUBJECTIVE:  SUBJECTIVE STATEMENT: The patient states he is getting much better with exercises.  He feels like the stretches are going well and tightness is improving.  PERTINENT HISTORY:  HTN; hyperlipemia; Rt THA; obesity; back pain; prediabetes; sleep apnea;   PAIN:  Are you having pain? Yes: NPRS scale: 3/10 Pain location:  Lt > Rt low back Pain description: sharp Aggravating factors: standing; walking; steps; lifting Relieving factors: sitting; not moving  PRECAUTIONS: None  PATIENT GOALS pain free;    OBJECTIVE:   TODAY'S TREATMENT  05/02/22: There Ex: Nu-step L5 x 5 minutes LEs only  Standing: Backwards walking 20 feet x 4 reps Marching with SBA 20 feet x 2 reps  Seated: Piriformis stretch seated x 30 xeconds  Supine: Hooklying near edge of mat hip flexion on/off mat x 10 reps, and thomas test stretch with gentle overpressure (R tighter than L) Trunk rotation supine x 10 reps  Manual: STM L glut and lumbar paraspinals    Trigger Point Dry-Needling  Treatment instructions: Expect mild to moderate muscle soreness. S/S of pneumothorax if dry needled over a lung field, and to seek immediate medical attention should they occur. Patient verbalized understanding of these instructions and education.  Patient Consent Given: Yes Education handout provided: Previously provided Muscles treated: Glut med, lumbar multifidi Electrical stimulation  performed: Yes Parameters:  to tolerance, 2 cps, intensity to tolerance, millicurrent Treatment response/outcome: palpable increased muscle length  04/30/22 Therex: Nustep L5 x 5 min   Sitting  hip flexor stretch 2x30 sec   Prone: Quad stretch with strap 2x30 sec, towel under knee for increased hip ext Hip ext knee bent 2x10 with AAROM (cues to keep left hip from rotating)   Supine: Low trunk rotation 3x10 sec Ab set 3x10 sec Ab set + clam shell green tband alternating 2x10   Manual therapy: Contract/relax lats and quad/hip flexors Hip ext PROM LAD R & L hip x30 sec    04/25/22 Therex: Sitting  hip flexor stretch 2x30 sec  Prone: Quad stretch with strap 2x30 sec, towel under knee for increased hip ext Hip ext knee bent 2x10 with AAROM (cues to keep left hip from rotating)  Supine: Low trunk rotation 3x10 sec Ab set 3x10 sec Ab set + clam shell green tband alternating 2x10  Manual therapy: Contract/relax lats and quad/hip flexors Hip ext PROM LAD R & L hip x30 sec  From initial evaluation: PATIENT SURVEYS:  FOTO 44  MUSCLE LENGTH: Hamstrings: tight Rt ~ 60 deg; Lt 65 deg Hip flexors tight Rt > Lt unable to tolerate [employer] test  POSTURE: rounded shoulders, forward head, decreased lumbar lordosis, increased thoracic kyphosis, posterior pelvic tilt, and flexed trunk   PALPATION: Muscular tightness Lt QL, lats, lumbar paraspinals, iliopsoas  LUMBAR ROM:   Active  A/PROM  eval  Flexion 45%  Extension -10%  Right lateral flexion 35%  Left lateral flexion 30%  Right rotation 15%  Left rotation 10%   (Blank rows = not tested)  LOWER EXTREMITY ROM:     Active  Right eval Left eval  Hip flexion tight tight  Hip extension -10 -10  Hip abduction tight tight  Hip adduction    Hip internal rotation    Hip external rotation    Knee flexion 90 90  Knee extension WNL's WNL's  Ankle dorsiflexion    Ankle plantarflexion    Ankle inversion    Ankle  eversion     (Blank rows = not tested)  LOWER EXTREMITY MMT:  MMT Right eval Left eval  Hip flexion 4/5 4/5  Hip extension 4-/5 4/4  Hip abduction 4-/5 4-/5  Hip adduction    Hip internal rotation    Hip external rotation    Knee flexion 5/5 5/5  Knee extension 5/5 5/5  Ankle dorsiflexion    Ankle plantarflexion    Ankle inversion    Ankle eversion     (Blank rows = not tested)  LUMBAR SPECIAL TESTS:  Straight leg raise test: Negative and Slump test: Negative    GAIT: Distance walked: 80 ft Assistive device utilized: None Comments: Antalgic gait with forward flexed trunk and hips     PATIENT EDUCATION:  Education details: continue current HEP Person educated: Patient Education method: Consulting civil engineer, Demonstration, Corporate treasurer cues, Verbal cues, and Handouts Education comprehension: verbalized understanding, returned demonstration, verbal cues required, tactile cues required, and needs further education   HOME EXERCISE PROGRAM: Access Code: [email or web address] URL: [email or web address] Date: 04/23/2022 Prepared by: Gillermo Murdoch  Exercises - Prone Press Up On Elbows  - 2 x daily - 7 x weekly - 1 sets - 1 reps - 2-4 min  hold - Prone Gluteal Sets  - 2 x daily - 7 x weekly - 1 sets - 10 reps - 10 sec  hold - Prone Quadriceps Stretch with Strap  - 2 x daily - 7 x weekly - 1 sets - 3 reps - 30 sec  hold - Seated Hip Flexor Stretch  - 2 x daily - 7 x weekly - 1 sets - 3 reps - 30 sec  hold - Supine Transversus Abdominis Bracing with Pelvic Floor Contraction  - 2 x daily - 7 x weekly - 1 sets - 10 reps - 10sec  hold - Supine Diaphragmatic Breathing  - 2 x daily - 7 x weekly - 1 sets - 10 reps - 4-6 sec  hold - Standing Hip Extension with Counter Support  - 2 x daily - 7 x weekly - 1-2 sets - 10 reps - 3-5 sec  hold - Standing Piriformis Release with Ball at [employer]  - 2 x daily - 7 x weekly - 30-60 sec  hold - Hooklying Isometric Clamshell  - 2 x daily - 7 x weekly - 1 sets  - 10 reps - 3 sec  hold    ASSESSMENT:  CLINICAL IMPRESSION: The patient tolerated DN with e-stim well today.  PT to continue to progress there ex working on flexibility and posterior chain strengthening.  He also notes imbalance and would benefit from dynamic standing strengthening to challenge balance.  OBJECTIVE IMPAIRMENTS Abnormal gait, decreased activity tolerance, decreased balance, decreased endurance, decreased mobility, decreased ROM, decreased strength, hypomobility, increased fascial restrictions, impaired flexibility, improper body mechanics, postural dysfunction, obesity, and pain.   ACTIVITY LIMITATIONS carrying, lifting, bending, sitting, standing, squatting, transfers, and bed mobility  PARTICIPATION LIMITATIONS: community activity and yard work  [place], Past/current experiences, and 1-2 comorbidities: HTN; Rt THA; prediabetes  are also affecting patient's functional outcome.   REHAB POTENTIAL: Good  CLINICAL DECISION MAKING: Stable/uncomplicated  EVALUATION COMPLEXITY: Low   GOALS: Goals reviewed with patient? Yes   LONG TERM GOALS: Target date: 06/06/2022  Improve posture and alignment with patient to demonstrate neutral spinal and hip alignment in standing Baseline:  Goal status: INITIAL  2.  Decrease back pain by 50-75% allowing patient to participate in normal functional activities with less pain and limitation  Baseline:  Goal status: INITIAL  3.  Increase spinal extension to 10-20%  range in standing  Baseline:  Goal status: INITIAL  4.  Independent in HEP (including aquatic program) Baseline:  Goal status: INITIAL  5.  Improve functional limitation score to 54 Baseline:  Goal status: INITIAL    PLAN: PT FREQUENCY: 2x/wk  PT DURATION: 6 weeks  PLANNED INTERVENTIONS: Therapeutic exercises, Therapeutic activity, Neuromuscular re-education, Balance training, Gait training, Patient/Family education, Self Care, Joint  mobilization, Aquatic Therapy, Dry Needling, Electrical stimulation, Spinal mobilization, Cryotherapy, Moist heat, Taping, Manual therapy, and Re-evaluation.  PLAN FOR NEXT SESSION: review and progress with HEP; continue with trial of DN to Lt lumbar spine when longer needles arrie; continue education and home instruction; modalities as indicated.   Rudell Cobb, PT  [facility] [place] [employer] [place], [place], [address] Phone: [phone]   Fax:  [phone]  Name: John Morrow MRN: [id number] Date of Birth: [birth year]

## 2022-05-03 ENCOUNTER — Other Ambulatory Visit: Payer: Self-pay | Admitting: Family Medicine

## 2022-05-04 ENCOUNTER — Other Ambulatory Visit: Payer: Self-pay | Admitting: Family Medicine

## 2022-05-04 DIAGNOSIS — M1712 Unilateral primary osteoarthritis, left knee: Secondary | ICD-10-CM

## 2022-05-05 ENCOUNTER — Ambulatory Visit: Payer: Medicare PPO | Admitting: Rehabilitative and Restorative Service Providers"

## 2022-05-05 ENCOUNTER — Encounter: Payer: Self-pay | Admitting: Rehabilitative and Restorative Service Providers"

## 2022-05-05 ENCOUNTER — Other Ambulatory Visit: Payer: Self-pay

## 2022-05-05 DIAGNOSIS — M5459 Other low back pain: Secondary | ICD-10-CM

## 2022-05-05 DIAGNOSIS — R29898 Other symptoms and signs involving the musculoskeletal system: Secondary | ICD-10-CM | POA: Diagnosis not present

## 2022-05-05 DIAGNOSIS — M545 Low back pain, unspecified: Secondary | ICD-10-CM | POA: Diagnosis not present

## 2022-05-05 NOTE — Therapy (Signed)
OUTPATIENT PHYSICAL THERAPY TREATMENT   Patient Name: John Morrow MRN: [id number] DOB:1951-05-21, 71 y.o., male Today's Date: 05/05/2022   PT End of Session - 05/05/22 1021     Visit Number 7    Number of Visits 12    Date for PT Re-Evaluation 05/28/22    PT Start Time 8676    PT Stop Time 1100    PT Time Calculation (min) 46 min    Activity Tolerance Patient tolerated treatment well              Past Medical History:  Diagnosis Date   Arthritis    Back pain    Diverticulosis of colon without hemorrhage 06/12/2016   On Colonoscopy 11/06/2010   HLD (hyperlipidemia) 05/29/2016   Hyperlipemia    Hypertension    Joint pain    Morbid obesity ([place]) 05/29/2016   Prediabetes    Right hip pain    needs hip replacement   Sleep apnea    on CPAP   Past Surgical History:  Procedure Laterality Date   KNEE SURGERY     REPLACEMENT TOTAL HIP W/  RESURFACING IMPLANTS Right 19/5093   Dr. Aleda Grana   Patient Active Problem List   Diagnosis Date Noted   Acute pancreatitis 04/07/2022   OSA (obstructive sleep apnea) 02/19/2022   Gout 12/24/2021   Chronic rhinitis 08/21/2021   Impotence of organic origin 02/13/2021   Lumbar spondylosis 10/09/2020   Pre-operative exam 07/18/2020   Verruca 05/01/2020   Epiploic appendagitis 02/20/2020   Bilateral shoulder region arthritis 04/13/2019   Vitamin D deficiency 02/08/2019   Insulin resistance 02/08/2019   Class 2 severe obesity with serious comorbidity and body mass index (BMI) of 39.0 to 39.9 in adult ([place]) 02/08/2019   Prediabetes 02/08/2019   Chronic pain 09/28/2018   Hip pain 06/28/2018   Dependent edema 05/05/2017   Primary osteoarthritis of left knee 09/25/2016   BPH (benign prostatic hyperplasia) 05/29/2016   Urine frequency 05/29/2016   HTN (hypertension) 05/29/2016   HLD (hyperlipidemia) 05/29/2016   Class 2 obesity due to excess calories without serious comorbidity with body mass index (BMI) of 39.0 to 39.9 in  adult 05/29/2016   Sleep apnea 03/31/2014    PCP: Dr Luetta Nutting  REFERRING PROVIDER: Dr Luetta Nutting  REFERRING DIAG: Acute LBP  Rationale for Evaluation and Treatment Rehabilitation  THERAPY DIAG:  Other low back pain  Other symptoms and signs involving the musculoskeletal system  ONSET DATE: 04/07/22; history of LBP for ~ 5 years   SUBJECTIVE:  SUBJECTIVE STATEMENT: The patient states he is getting much better with exercises.  He feels like the stretches are going well and tightness is improving. Still having some Lt LBP  PERTINENT HISTORY:  HTN; hyperlipemia; Rt THA; obesity; back pain; prediabetes; sleep apnea;   PAIN:  Are you having pain? Yes: NPRS scale: 3/10 Pain location:  Lt > Rt low back Pain description: aching; nagging Aggravating factors: standing; walking; steps; lifting Relieving factors: sitting; not moving  PRECAUTIONS: None  PATIENT GOALS pain free;    OBJECTIVE:   TODAY'S TREATMENT  8/211/23: There Ex: Nu-step L5 x 6 minutes LE/UEs  Standing: Backwards walking 20 feet x 4 reps Marching with SBA 20 feet x 2 reps  Seated: Hip flexor stretch seated x 30 seconds x 3 reps   Supine: Hooklying bilat hip flexion dropping one LE off edge of table for hip flexor and quad stretch 20-30 sec x 3 reps  Trunk rotation supine x 10 reps  Manual: STM Lt QL with pt sidelying  - deep tissue work     Psychologist, educational  Treatment instructions: Expect mild to moderate muscle soreness. S/S of pneumothorax if dry needled over a lung field, and to seek immediate medical attention should they occur. Patient verbalized understanding of these instructions and education.  Patient Consent Given: Yes Education handout provided: Previously provided Muscles treated: QL,  lumbar multifidi Electrical stimulation performed: Yes Parameters:  to tolerance, 2 cps, intensity to tolerance, millicurrent Treatment response/outcome: palpable increased muscle length  Modalities: Moist heat x 10 min   04/30/22 Therex: Nustep L5 x 5 min   Sitting  hip flexor stretch 2x30 sec   Prone: Quad stretch with strap 2x30 sec, towel under knee for increased hip ext Hip ext knee bent 2x10 with AAROM (cues to keep left hip from rotating)   Supine: Low trunk rotation 3x10 sec Ab set 3x10 sec Ab set + clam shell green tband alternating 2x10   Manual therapy: Contract/relax lats and quad/hip flexors Hip ext PROM LAD R & L hip x30 sec    From initial evaluation: PATIENT SURVEYS:  FOTO 44  MUSCLE LENGTH: Hamstrings: tight Rt ~ 60 deg; Lt 65 deg Hip flexors tight Rt > Lt unable to tolerate [employer] test  POSTURE: rounded shoulders, forward head, decreased lumbar lordosis, increased thoracic kyphosis, posterior pelvic tilt, and flexed trunk   PALPATION: Muscular tightness Lt QL, lats, lumbar paraspinals, iliopsoas  LUMBAR ROM:   Active  A/PROM  eval  Flexion 45%  Extension -10%  Right lateral flexion 35%  Left lateral flexion 30%  Right rotation 15%  Left rotation 10%   (Blank rows = not tested)  LOWER EXTREMITY ROM:     Active  Right eval Left eval  Hip flexion tight tight  Hip extension -10 -10  Hip abduction tight tight  Hip adduction    Hip internal rotation    Hip external rotation    Knee flexion 90 90  Knee extension WNL's WNL's  Ankle dorsiflexion    Ankle plantarflexion    Ankle inversion    Ankle eversion     (Blank rows = not tested)  LOWER EXTREMITY MMT:    MMT Right eval Left eval  Hip flexion 4/5 4/5  Hip extension 4-/5 4/4  Hip abduction 4-/5 4-/5  Hip adduction    Hip internal rotation    Hip external rotation    Knee flexion 5/5 5/5  Knee extension 5/5 5/5  Ankle dorsiflexion  Ankle plantarflexion    Ankle  inversion    Ankle eversion     (Blank rows = not tested)  LUMBAR SPECIAL TESTS:  Straight leg raise test: Negative and Slump test: Negative    GAIT: Distance walked: 80 ft Assistive device utilized: None Comments: Antalgic gait with forward flexed trunk and hips     PATIENT EDUCATION:  Education details: continue current HEP Person educated: Patient Education method: Consulting civil engineer, Demonstration, Corporate treasurer cues, Verbal cues, and Handouts Education comprehension: verbalized understanding, returned demonstration, verbal cues required, tactile cues required, and needs further education   HOME EXERCISE PROGRAM: Access Code: [email or web address] URL: [email or web address] Date: 04/23/2022 Prepared by: Gillermo Murdoch  Exercises - Prone Press Up On Elbows  - 2 x daily - 7 x weekly - 1 sets - 1 reps - 2-4 min  hold - Prone Gluteal Sets  - 2 x daily - 7 x weekly - 1 sets - 10 reps - 10 sec  hold - Prone Quadriceps Stretch with Strap  - 2 x daily - 7 x weekly - 1 sets - 3 reps - 30 sec  hold - Seated Hip Flexor Stretch  - 2 x daily - 7 x weekly - 1 sets - 3 reps - 30 sec  hold - Supine Transversus Abdominis Bracing with Pelvic Floor Contraction  - 2 x daily - 7 x weekly - 1 sets - 10 reps - 10sec  hold - Supine Diaphragmatic Breathing  - 2 x daily - 7 x weekly - 1 sets - 10 reps - 4-6 sec  hold - Standing Hip Extension with Counter Support  - 2 x daily - 7 x weekly - 1-2 sets - 10 reps - 3-5 sec  hold - Standing Piriformis Release with Ball at [employer]  - 2 x daily - 7 x weekly - 30-60 sec  hold - Hooklying Isometric Clamshell  - 2 x daily - 7 x weekly - 1 sets - 10 reps - 3 sec  hold    ASSESSMENT:  CLINICAL IMPRESSION: Persistent muscular tightness in the Lt QL and bilat hip flexors/quads. The patient tolerated manual work, and DN with e-stim well today.  PT to continue to progress there ex working on flexibility and posterior chain strengthening.  He also notes imbalance and would  benefit from dynamic standing strengthening to challenge balance.  OBJECTIVE IMPAIRMENTS Abnormal gait, decreased activity tolerance, decreased balance, decreased endurance, decreased mobility, decreased ROM, decreased strength, hypomobility, increased fascial restrictions, impaired flexibility, improper body mechanics, postural dysfunction, obesity, and pain.   ACTIVITY LIMITATIONS carrying, lifting, bending, sitting, standing, squatting, transfers, and bed mobility  PARTICIPATION LIMITATIONS: community activity and yard work  [place], Past/current experiences, and 1-2 comorbidities: HTN; Rt THA; prediabetes  are also affecting patient's functional outcome.   REHAB POTENTIAL: Good  CLINICAL DECISION MAKING: Stable/uncomplicated  EVALUATION COMPLEXITY: Low   GOALS: Goals reviewed with patient? Yes   LONG TERM GOALS: Target date: 06/06/2022  Improve posture and alignment with patient to demonstrate neutral spinal and hip alignment in standing Baseline:  Goal status: INITIAL  2.  Decrease back pain by 50-75% allowing patient to participate in normal functional activities with less pain and limitation  Baseline:  Goal status: INITIAL  3.  Increase spinal extension to 10-20% range in standing  Baseline:  Goal status: INITIAL  4.  Independent in HEP (including aquatic program) Baseline:  Goal status: INITIAL  5.  Improve functional limitation score to 54 Baseline:  Goal status: INITIAL  PLAN: PT FREQUENCY: 2x/wk  PT DURATION: 6 weeks  PLANNED INTERVENTIONS: Therapeutic exercises, Therapeutic activity, Neuromuscular re-education, Balance training, Gait training, Patient/Family education, Self Care, Joint mobilization, Aquatic Therapy, Dry Needling, Electrical stimulation, Spinal mobilization, Cryotherapy, Moist heat, Taping, Manual therapy, and Re-evaluation.  PLAN FOR NEXT SESSION: review and progress with HEP; continue of DN to Lt QL, lats, lumbar  spine; progress with dynamic stabilization; continue education and home instruction; modalities as indicated.   [place], PT, MPH   [place], [place], [address] Phone: [phone]   Fax:  [phone]  Name: Robb Sibal MRN: [id number] Date of Birth: 13-Oct-1950

## 2022-05-06 DIAGNOSIS — G4710 Hypersomnia, unspecified: Secondary | ICD-10-CM | POA: Diagnosis not present

## 2022-05-08 ENCOUNTER — Ambulatory Visit (HOSPITAL_BASED_OUTPATIENT_CLINIC_OR_DEPARTMENT_OTHER): Payer: Medicare PPO | Attending: Family Medicine | Admitting: Physical Therapy

## 2022-05-08 ENCOUNTER — Encounter (HOSPITAL_BASED_OUTPATIENT_CLINIC_OR_DEPARTMENT_OTHER): Payer: Self-pay | Admitting: Physical Therapy

## 2022-05-08 DIAGNOSIS — R29898 Other symptoms and signs involving the musculoskeletal system: Secondary | ICD-10-CM | POA: Insufficient documentation

## 2022-05-08 DIAGNOSIS — M5459 Other low back pain: Secondary | ICD-10-CM | POA: Diagnosis not present

## 2022-05-08 NOTE — Therapy (Addendum)
OUTPATIENT PHYSICAL THERAPY TREATMENT   Patient Name: John Morrow MRN: [id number] DOB:1951-06-27, 71 y.o., male Today's Date: 05/08/2022   PT End of Session - 05/08/22 0944     Visit Number 8    Number of Visits 12    Date for PT Re-Evaluation 05/28/22    PT Start Time 0944    PT Stop Time 1023    PT Time Calculation (min) 39 min    Activity Tolerance Patient tolerated treatment well    Behavior During Therapy [facility] for tasks assessed/performed              Past Medical History:  Diagnosis Date   Arthritis    Back pain    Diverticulosis of colon without hemorrhage 06/12/2016   On Colonoscopy 11/06/2010   HLD (hyperlipidemia) 05/29/2016   Hyperlipemia    Hypertension    Joint pain    Morbid obesity ([place]) 05/29/2016   Prediabetes    Right hip pain    needs hip replacement   Sleep apnea    on CPAP   Past Surgical History:  Procedure Laterality Date   KNEE SURGERY     REPLACEMENT TOTAL HIP W/  RESURFACING IMPLANTS Right 30/8657   Dr. Aleda Grana   Patient Active Problem List   Diagnosis Date Noted   Acute pancreatitis 04/07/2022   OSA (obstructive sleep apnea) 02/19/2022   Gout 12/24/2021   Chronic rhinitis 08/21/2021   Impotence of organic origin 02/13/2021   Lumbar spondylosis 10/09/2020   Pre-operative exam 07/18/2020   Verruca 05/01/2020   Epiploic appendagitis 02/20/2020   Bilateral shoulder region arthritis 04/13/2019   Vitamin D deficiency 02/08/2019   Insulin resistance 02/08/2019   Class 2 severe obesity with serious comorbidity and body mass index (BMI) of 39.0 to 39.9 in adult ([place]) 02/08/2019   Prediabetes 02/08/2019   Chronic pain 09/28/2018   Hip pain 06/28/2018   Dependent edema 05/05/2017   Primary osteoarthritis of left knee 09/25/2016   BPH (benign prostatic hyperplasia) 05/29/2016   Urine frequency 05/29/2016   HTN (hypertension) 05/29/2016   HLD (hyperlipidemia) 05/29/2016   Class 2 obesity due to excess calories without serious  comorbidity with body mass index (BMI) of 39.0 to 39.9 in adult 05/29/2016   Sleep apnea 03/31/2014    PCP: Dr Luetta Nutting  REFERRING PROVIDER: Dr Luetta Nutting  REFERRING DIAG: Acute LBP  Rationale for Evaluation and Treatment Rehabilitation  THERAPY DIAG:  Other low back pain  Other symptoms and signs involving the musculoskeletal system  ONSET DATE: 04/07/22; history of LBP for ~ 5 years   SUBJECTIVE:  SUBJECTIVE STATEMENT: Pt states he had "0 pain for many hours" after last session.  "I need a knee replacement and to lose weight".   PERTINENT HISTORY:  HTN; hyperlipemia; Rt THA; obesity; back pain; prediabetes; sleep apnea;   PAIN:  Are you having pain? Yes: NPRS scale: 1-2/10 Pain location:  Lt > Rt low back Pain description: aching; nagging Aggravating factors: standing; walking; steps; lifting Relieving factors: sitting; not moving  PRECAUTIONS: None  PATIENT GOALS   to be pain free, or comfortable    OBJECTIVE:   TODAY'S TREATMENT  05/08/22: Pt seen for aquatic therapy today.  Treatment took place in water 3.25-4.5 ft in depth at the [place]. Temp of water was 91.  Pt entered/exited the pool via stairs independently with bilat rail. * Holding yellow noodle:  Forward / backward gait with cues for step length and increased knee flexion; side stepping;  * Holding yellow hand buoys:  SLS with toe taps front, side, back x 5 x 2 each LE (good challenge);  squats (cues for form) * Holding wall:  hip flexor stretch x 20s x 2 * Warrior 1, lifting noodle off of water towards ceiling, in staggered stance x 5 each side (good challenge) * tandem gait forward/ backward holding yellow hand buoys * Quad stretch with foot on 2nd step behind him, 20s x 2 each LE  Pt requires  the buoyancy and hydrostatic pressure of water for support, and to offload joints by unweighting joint load by at least 50 % in navel deep water and by at least 75-80% in chest to neck deep water.  Viscosity of the water is needed for resistance of strengthening. Water current perturbations provides challenge to standing balance requiring increased core activation.    8/211/23: There Ex: Nu-step L5 x 6 minutes LE/UEs Backwards walking 20 feet x 4 reps Marching with SBA 20 feet x 2 reps Seated: Hip flexor stretch seated x 30 seconds x 3 reps  Supine: Hooklying bilat hip flexion dropping one LE off edge of table for hip flexor and quad stretch 20-30 sec x 3 reps  Trunk rotation supine x 10 reps Manual: STM Lt QL with pt sidelying  - deep tissue work     Psychologist, educational  Treatment instructions: Expect mild to moderate muscle soreness. S/S of pneumothorax if dry needled over a lung field, and to seek immediate medical attention should they occur. Patient verbalized understanding of these instructions and education.  Patient Consent Given: Yes Education handout provided: Previously provided Muscles treated: QL, lumbar multifidi Electrical stimulation performed: Yes Parameters:  to tolerance, 2 cps, intensity to tolerance, millicurrent Treatment response/outcome: palpable increased muscle length Modalities: Moist heat x 10 min  From initial evaluation: PATIENT SURVEYS:  FOTO 44  MUSCLE LENGTH: Hamstrings: tight Rt ~ 60 deg; Lt 65 deg Hip flexors tight Rt > Lt unable to tolerate [employer] test  POSTURE: rounded shoulders, forward head, decreased lumbar lordosis, increased thoracic kyphosis, posterior pelvic tilt, and flexed trunk   PALPATION: Muscular tightness Lt QL, lats, lumbar paraspinals, iliopsoas  LUMBAR ROM:   Active  A/PROM  eval  Flexion 45%  Extension -10%  Right lateral flexion 35%  Left lateral flexion 30%  Right rotation 15%  Left rotation 10%    (Blank rows = not tested)  LOWER EXTREMITY ROM:     Active  Right eval Left eval  Hip flexion tight tight  Hip extension -10 -10  Hip abduction tight tight  Hip adduction  Hip internal rotation    Hip external rotation    Knee flexion 90 90  Knee extension WNL's WNL's  Ankle dorsiflexion    Ankle plantarflexion    Ankle inversion    Ankle eversion     (Blank rows = not tested)  LOWER EXTREMITY MMT:    MMT Right eval Left eval  Hip flexion 4/5 4/5  Hip extension 4-/5 4/4  Hip abduction 4-/5 4-/5  Hip adduction    Hip internal rotation    Hip external rotation    Knee flexion 5/5 5/5  Knee extension 5/5 5/5  Ankle dorsiflexion    Ankle plantarflexion    Ankle inversion    Ankle eversion     (Blank rows = not tested)  LUMBAR SPECIAL TESTS:  Straight leg raise test: Negative and Slump test: Negative  GAIT: Distance walked: 80 ft Assistive device utilized: None Comments: Antalgic gait with forward flexed trunk and hips   PATIENT EDUCATION:  Education details: aquatic introduction Person educated: Patient Education method: Consulting civil engineer, Media planner, Corporate treasurer cues, Verbal cues Education comprehension: verbalized understanding, returned demonstration, verbal cues required, tactile cues required, and needs further education   HOME EXERCISE PROGRAM: Access Code: [email or web address] URL: [email or web address] Date: 04/23/2022 Prepared by: Gillermo Murdoch  Exercises - Prone Press Up On Elbows  - 2 x daily - 7 x weekly - 1 sets - 1 reps - 2-4 min  hold - Prone Gluteal Sets  - 2 x daily - 7 x weekly - 1 sets - 10 reps - 10 sec  hold - Prone Quadriceps Stretch with Strap  - 2 x daily - 7 x weekly - 1 sets - 3 reps - 30 sec  hold - Seated Hip Flexor Stretch  - 2 x daily - 7 x weekly - 1 sets - 3 reps - 30 sec  hold - Supine Transversus Abdominis Bracing with Pelvic Floor Contraction  - 2 x daily - 7 x weekly - 1 sets - 10 reps - 10sec  hold - Supine Diaphragmatic  Breathing  - 2 x daily - 7 x weekly - 1 sets - 10 reps - 4-6 sec  hold - Standing Hip Extension with Counter Support  - 2 x daily - 7 x weekly - 1-2 sets - 10 reps - 3-5 sec  hold - Standing Piriformis Release with Ball at [employer]  - 2 x daily - 7 x weekly - 30-60 sec  hold - Hooklying Isometric Clamshell  - 2 x daily - 7 x weekly - 1 sets - 10 reps - 3 sec  hold    ASSESSMENT:  CLINICAL IMPRESSION: Pt is confident in aquatic environment; able to take direction from therapist on deck. He walks in more relaxed manner with UE on floatation device.  He would benefit from balance exercises in future aquatic sessions; demonstrates decreased balance with narrow base of support and in wide staggered stance. L/R quads remain tight (observed with standing quad stretch) .  Pt reported elimination of pain in L glute/LB upon entering water, remained pain free throughout session, and upon exiting pool.  Goals are ongoing.   OBJECTIVE IMPAIRMENTS Abnormal gait, decreased activity tolerance, decreased balance, decreased endurance, decreased mobility, decreased ROM, decreased strength, hypomobility, increased fascial restrictions, impaired flexibility, improper body mechanics, postural dysfunction, obesity, and pain.   ACTIVITY LIMITATIONS carrying, lifting, bending, sitting, standing, squatting, transfers, and bed mobility  PARTICIPATION LIMITATIONS: community activity and yard work  [place], Past/current experiences, and 1-2 comorbidities: HTN; Rt  THA; prediabetes  are also affecting patient's functional outcome.   REHAB POTENTIAL: Good  CLINICAL DECISION MAKING: Stable/uncomplicated  EVALUATION COMPLEXITY: Low   GOALS: Goals reviewed with patient? Yes   LONG TERM GOALS: Target date: 06/06/2022  Improve posture and alignment with patient to demonstrate neutral spinal and hip alignment in standing Baseline:  Goal status: INITIAL  2.  Decrease back pain by 50-75% allowing patient to  participate in normal functional activities with less pain and limitation  Baseline:  Goal status: INITIAL  3.  Increase spinal extension to 10-20% range in standing  Baseline:  Goal status: INITIAL  4.  Independent in HEP (including aquatic program) Baseline:  Goal status: INITIAL  5.  Improve functional limitation score to 54 Baseline:  Goal status: INITIAL    PLAN: PT FREQUENCY: 2x/wk  PT DURATION: 6 weeks  PLANNED INTERVENTIONS: Therapeutic exercises, Therapeutic activity, Neuromuscular re-education, Balance training, Gait training, Patient/Family education, Self Care, Joint mobilization, Aquatic Therapy, Dry Needling, Electrical stimulation, Spinal mobilization, Cryotherapy, Moist heat, Taping, Manual therapy, and Re-evaluation.  PLAN FOR NEXT SESSION: review and progress with HEP; continue of DN to Lt QL, lats, lumbar spine; progress with dynamic stabilization; continue education and home instruction; modalities as indicated.  Progress aquatic exercises.   Kerin Perna, PTA 05/08/22 10:26 AM

## 2022-05-09 NOTE — Telephone Encounter (Signed)
Last OV: 04/07/22 Next OV: 07/08/22 Last RF: 04/08/22

## 2022-05-12 ENCOUNTER — Ambulatory Visit: Payer: Medicare PPO | Admitting: Physical Therapy

## 2022-05-15 ENCOUNTER — Encounter (HOSPITAL_BASED_OUTPATIENT_CLINIC_OR_DEPARTMENT_OTHER): Payer: Self-pay | Admitting: Physical Therapy

## 2022-05-15 ENCOUNTER — Ambulatory Visit (HOSPITAL_BASED_OUTPATIENT_CLINIC_OR_DEPARTMENT_OTHER): Payer: Medicare PPO | Admitting: Physical Therapy

## 2022-05-15 DIAGNOSIS — M5459 Other low back pain: Secondary | ICD-10-CM

## 2022-05-15 DIAGNOSIS — R29898 Other symptoms and signs involving the musculoskeletal system: Secondary | ICD-10-CM

## 2022-05-15 NOTE — Therapy (Signed)
OUTPATIENT PHYSICAL THERAPY TREATMENT   Patient Name: John Morrow MRN: [id number] DOB:1951/03/11, 71 y.o., male Today's Date: 05/15/2022   PT End of Session - 05/15/22 0946     Visit Number 9    Number of Visits 12    Date for PT Re-Evaluation 05/28/22    PT Start Time 0946    PT Stop Time 1025    PT Time Calculation (min) 39 min    Activity Tolerance Patient tolerated treatment well    Behavior During Therapy [facility] for tasks assessed/performed              Past Medical History:  Diagnosis Date   Arthritis    Back pain    Diverticulosis of colon without hemorrhage 06/12/2016   On Colonoscopy 11/06/2010   HLD (hyperlipidemia) 05/29/2016   Hyperlipemia    Hypertension    Joint pain    Morbid obesity ([place]) 05/29/2016   Prediabetes    Right hip pain    needs hip replacement   Sleep apnea    on CPAP   Past Surgical History:  Procedure Laterality Date   KNEE SURGERY     REPLACEMENT TOTAL HIP W/  RESURFACING IMPLANTS Right 92/1194   Dr. Aleda Grana   Patient Active Problem List   Diagnosis Date Noted   Acute pancreatitis 04/07/2022   OSA (obstructive sleep apnea) 02/19/2022   Gout 12/24/2021   Chronic rhinitis 08/21/2021   Impotence of organic origin 02/13/2021   Lumbar spondylosis 10/09/2020   Pre-operative exam 07/18/2020   Verruca 05/01/2020   Epiploic appendagitis 02/20/2020   Bilateral shoulder region arthritis 04/13/2019   Vitamin D deficiency 02/08/2019   Insulin resistance 02/08/2019   Class 2 severe obesity with serious comorbidity and body mass index (BMI) of 39.0 to 39.9 in adult ([name]) 02/08/2019   Prediabetes 02/08/2019   Chronic pain 09/28/2018   Hip pain 06/28/2018   Dependent edema 05/05/2017   Primary osteoarthritis of left knee 09/25/2016   BPH (benign prostatic hyperplasia) 05/29/2016   Urine frequency 05/29/2016   HTN (hypertension) 05/29/2016   HLD (hyperlipidemia) 05/29/2016   Class 2 obesity due to excess calories without serious  comorbidity with body mass index (BMI) of 39.0 to 39.9 in adult 05/29/2016   Sleep apnea 03/31/2014    PCP: Dr Luetta Nutting  REFERRING PROVIDER: Dr Luetta Nutting  REFERRING DIAG: Acute LBP  Rationale for Evaluation and Treatment Rehabilitation  THERAPY DIAG:  Other low back pain  Other symptoms and signs involving the musculoskeletal system  ONSET DATE: 04/07/22; history of LBP for ~ 5 years   SUBJECTIVE:  SUBJECTIVE STATEMENT: Pt reports very little pain after last session.  He'd like to know what leg exercises he can do at the gym to "get the strength back in his legs".   PERTINENT HISTORY:  HTN; hyperlipemia; Rt THA; obesity; back pain; prediabetes; sleep apnea;   PAIN:  Are you having pain? Yes: NPRS scale: 1/10  "nothing to mention" Pain location:  Lt low back Pain description: aching Aggravating factors: standing; walking; steps; lifting Relieving factors: sitting; not moving  PRECAUTIONS: None  PATIENT GOALS   to be pain free, or comfortable    OBJECTIVE:   TODAY'S TREATMENT  05/15/22: Pt seen for aquatic therapy today.  Treatment took place in water 3.25-4.75 ft in depth at the [place]. Temp of water was 91.  Pt entered/exited the pool via stairs independently with bilat rail.  * Forward / backward walking; high knee marching forward;  side stepping with arm abdct/ add;  * Holding yellow hand buoys:  SLS with 3 way toe taps x 10 each LE (improved);  squats pushing buoy under water; forward/backward walking with yellow hand buoys at sides under water for core engagement.  * Holding wall:  hip flexor stretch x 20s x 2 - cues for posture and form * Warrior 1, lifting noodle off of water towards ceiling, in staggered stance x 5 each side (good challenge) * Single  leg clam holding wall  * leg press/ Yellow noodle stomp with back against wall, then with single UE on wall   05/08/22: Pt seen for aquatic therapy today.  Treatment took place in water 3.25-4.5 ft in depth at the [place]. Temp of water was 91.  Pt entered/exited the pool via stairs independently with bilat rail. * Holding yellow noodle:  Forward / backward gait with cues for step length and increased knee flexion; side stepping;  * Holding yellow hand buoys:  SLS with toe taps front, side, back x 5 x 2 each LE (good challenge);  squats (cues for form) * Holding wall:  hip flexor stretch x 20s x 2 * Warrior 1, lifting noodle off of water towards ceiling, in staggered stance x 5 each side (good challenge) * tandem gait forward/ backward holding yellow hand buoys * Quad stretch with foot on 2nd step behind him, 20s x 2 each LE   8/211/23: There Ex: Nu-step L5 x 6 minutes LE/UEs Backwards walking 20 feet x 4 reps Marching with SBA 20 feet x 2 reps Seated: Hip flexor stretch seated x 30 seconds x 3 reps  Supine: Hooklying bilat hip flexion dropping one LE off edge of table for hip flexor and quad stretch 20-30 sec x 3 reps  Trunk rotation supine x 10 reps Manual: STM Lt QL with pt sidelying  - deep tissue work     Psychologist, educational  Treatment instructions: Expect mild to moderate muscle soreness. S/S of pneumothorax if dry needled over a lung field, and to seek immediate medical attention should they occur. Patient verbalized understanding of these instructions and education.  Patient Consent Given: Yes Education handout provided: Previously provided Muscles treated: QL, lumbar multifidi Electrical stimulation performed: Yes Parameters:  to tolerance, 2 cps, intensity to tolerance, millicurrent Treatment response/outcome: palpable increased muscle length Modalities: Moist heat x 10 min  From initial evaluation: PATIENT SURVEYS:  FOTO 44  MUSCLE  LENGTH: Hamstrings: tight Rt ~ 60 deg; Lt 65 deg Hip flexors tight Rt > Lt unable to tolerate [employer] test  POSTURE: rounded  shoulders, forward head, decreased lumbar lordosis, increased thoracic kyphosis, posterior pelvic tilt, and flexed trunk   PALPATION: Muscular tightness Lt QL, lats, lumbar paraspinals, iliopsoas  LUMBAR ROM:   Active  A/PROM  eval  Flexion 45%  Extension -10%  Right lateral flexion 35%  Left lateral flexion 30%  Right rotation 15%  Left rotation 10%   (Blank rows = not tested)  LOWER EXTREMITY ROM:     Active  Right eval Left eval  Hip flexion tight tight  Hip extension -10 -10  Hip abduction tight tight  Hip adduction    Hip internal rotation    Hip external rotation    Knee flexion 90 90  Knee extension WNL's WNL's  Ankle dorsiflexion    Ankle plantarflexion    Ankle inversion    Ankle eversion     (Blank rows = not tested)  LOWER EXTREMITY MMT:    MMT Right eval Left eval  Hip flexion 4/5 4/5  Hip extension 4-/5 4/4  Hip abduction 4-/5 4-/5  Hip adduction    Hip internal rotation    Hip external rotation    Knee flexion 5/5 5/5  Knee extension 5/5 5/5  Ankle dorsiflexion    Ankle plantarflexion    Ankle inversion    Ankle eversion     (Blank rows = not tested)  LUMBAR SPECIAL TESTS:  Straight leg raise test: Negative and Slump test: Negative  GAIT: Distance walked: 80 ft Assistive device utilized: None Comments: Antalgic gait with forward flexed trunk and hips   PATIENT EDUCATION:  Education details: aquatic introduction Person educated: Patient Education method: Consulting civil engineer, Media planner, Corporate treasurer cues, Verbal cues Education comprehension: verbalized understanding, returned demonstration, verbal cues required, tactile cues required, and needs further education   HOME EXERCISE PROGRAM: Access Code: [email or web address] URL: [email or web address] Date: 04/23/2022 Prepared by: Gillermo Murdoch  Exercises - Prone  Press Up On Elbows  - 2 x daily - 7 x weekly - 1 sets - 1 reps - 2-4 min  hold - Prone Gluteal Sets  - 2 x daily - 7 x weekly - 1 sets - 10 reps - 10 sec  hold - Prone Quadriceps Stretch with Strap  - 2 x daily - 7 x weekly - 1 sets - 3 reps - 30 sec  hold - Seated Hip Flexor Stretch  - 2 x daily - 7 x weekly - 1 sets - 3 reps - 30 sec  hold - Supine Transversus Abdominis Bracing with Pelvic Floor Contraction  - 2 x daily - 7 x weekly - 1 sets - 10 reps - 10sec  hold - Supine Diaphragmatic Breathing  - 2 x daily - 7 x weekly - 1 sets - 10 reps - 4-6 sec  hold - Standing Hip Extension with Counter Support  - 2 x daily - 7 x weekly - 1-2 sets - 10 reps - 3-5 sec  hold - Standing Piriformis Release with Ball at [employer]  - 2 x daily - 7 x weekly - 30-60 sec  hold - Hooklying Isometric Clamshell  - 2 x daily - 7 x weekly - 1 sets - 10 reps - 3 sec  hold    ASSESSMENT:  CLINICAL IMPRESSION: Pt able to walking in water in relaxed manner without UE support (as in last session).  He is challenged with SLS and narrow BOS exercises.  Encouraged pt to speak to therapist at next land visit to discuss which gym exercises for LE to perform.  He  remained painfree throughout session.  He requires occasional cues for more upright posture. Plan to issue aquatic HEP at next pool visit.   Goals are ongoing.   OBJECTIVE IMPAIRMENTS Abnormal gait, decreased activity tolerance, decreased balance, decreased endurance, decreased mobility, decreased ROM, decreased strength, hypomobility, increased fascial restrictions, impaired flexibility, improper body mechanics, postural dysfunction, obesity, and pain.   ACTIVITY LIMITATIONS carrying, lifting, bending, sitting, standing, squatting, transfers, and bed mobility  PARTICIPATION LIMITATIONS: community activity and yard work  [place], Past/current experiences, and 1-2 comorbidities: HTN; Rt THA; prediabetes  are also affecting patient's functional outcome.    REHAB POTENTIAL: Good  CLINICAL DECISION MAKING: Stable/uncomplicated  EVALUATION COMPLEXITY: Low   GOALS: Goals reviewed with patient? Yes   LONG TERM GOALS: Target date: 06/06/2022  Improve posture and alignment with patient to demonstrate neutral spinal and hip alignment in standing Baseline:  Goal status: INITIAL  2.  Decrease back pain by 50-75% allowing patient to participate in normal functional activities with less pain and limitation  Baseline:  Goal status: INITIAL  3.  Increase spinal extension to 10-20% range in standing  Baseline:  Goal status: INITIAL  4.  Independent in HEP (including aquatic program) Baseline:  Goal status: INITIAL  5.  Improve functional limitation score to 54 Baseline:  Goal status: INITIAL    PLAN: PT FREQUENCY: 2x/wk  PT DURATION: 6 weeks  PLANNED INTERVENTIONS: Therapeutic exercises, Therapeutic activity, Neuromuscular re-education, Balance training, Gait training, Patient/Family education, Self Care, Joint mobilization, Aquatic Therapy, Dry Needling, Electrical stimulation, Spinal mobilization, Cryotherapy, Moist heat, Taping, Manual therapy, and Re-evaluation.  PLAN FOR NEXT SESSION: review and progress with HEP; continue of DN to Lt QL, lats, lumbar spine; progress with dynamic stabilization; continue education and home instruction; modalities as indicated.   10th visit progress note.   Kerin Perna, PTA 05/15/22 1:53 PM

## 2022-05-20 ENCOUNTER — Encounter: Payer: Self-pay | Admitting: Rehabilitative and Restorative Service Providers"

## 2022-05-20 ENCOUNTER — Ambulatory Visit: Payer: Medicare PPO | Attending: Family Medicine | Admitting: Rehabilitative and Restorative Service Providers"

## 2022-05-20 ENCOUNTER — Encounter: Payer: Self-pay | Admitting: Gastroenterology

## 2022-05-20 DIAGNOSIS — M5459 Other low back pain: Secondary | ICD-10-CM | POA: Insufficient documentation

## 2022-05-20 DIAGNOSIS — R29898 Other symptoms and signs involving the musculoskeletal system: Secondary | ICD-10-CM | POA: Diagnosis not present

## 2022-05-20 NOTE — Therapy (Signed)
OUTPATIENT PHYSICAL THERAPY TREATMENT AND  MEDICARE 10TH VISIT NOTE    Patient Name: John Morrow MRN: [id number] DOB:1951/03/05, 71 y.o., male Today's Date: 05/20/2022   PT End of Session - 05/20/22 0846     Visit Number 10    Number of Visits 12    Date for PT Re-Evaluation 05/28/22    PT Start Time 0844    PT Stop Time 0930    PT Time Calculation (min) 46 min              Past Medical History:  Diagnosis Date   Arthritis    Back pain    Diverticulosis of colon without hemorrhage 06/12/2016   On Colonoscopy 11/06/2010   HLD (hyperlipidemia) 05/29/2016   Hyperlipemia    Hypertension    Joint pain    Morbid obesity ([place]) 05/29/2016   Prediabetes    Right hip pain    needs hip replacement   Sleep apnea    on CPAP   Past Surgical History:  Procedure Laterality Date   KNEE SURGERY     REPLACEMENT TOTAL HIP W/  RESURFACING IMPLANTS Right 84/6659   Dr. Aleda Grana   Patient Active Problem List   Diagnosis Date Noted   Acute pancreatitis 04/07/2022   OSA (obstructive sleep apnea) 02/19/2022   Gout 12/24/2021   Chronic rhinitis 08/21/2021   Impotence of organic origin 02/13/2021   Lumbar spondylosis 10/09/2020   Pre-operative exam 07/18/2020   Verruca 05/01/2020   Epiploic appendagitis 02/20/2020   Bilateral shoulder region arthritis 04/13/2019   Vitamin D deficiency 02/08/2019   Insulin resistance 02/08/2019   Class 2 severe obesity with serious comorbidity and body mass index (BMI) of 39.0 to 39.9 in adult ([place]) 02/08/2019   Prediabetes 02/08/2019   Chronic pain 09/28/2018   Hip pain 06/28/2018   Dependent edema 05/05/2017   Primary osteoarthritis of left knee 09/25/2016   BPH (benign prostatic hyperplasia) 05/29/2016   Urine frequency 05/29/2016   HTN (hypertension) 05/29/2016   HLD (hyperlipidemia) 05/29/2016   Class 2 obesity due to excess calories without serious comorbidity with body mass index (BMI) of 39.0 to 39.9 in adult 05/29/2016   Sleep  apnea 03/31/2014    PCP: Dr Luetta Nutting  REFERRING PROVIDER: Dr Luetta Nutting  REFERRING DIAG: Acute LBP  Rationale for Evaluation and Treatment Rehabilitation  THERAPY DIAG:  No diagnosis found.  ONSET DATE: 04/07/22; history of LBP for ~ 5 years   SUBJECTIVE:  SUBJECTIVE STATEMENT: 05/20/22: Pain is decreasing and activity level is increasing. Working on exercises at home. Could tell a big difference with deep tissue work - deep pressure in the LB and posterior hip area. Good response to the water exercises. Wants to learn exercises to help strengthen LE's.   PERTINENT HISTORY:  HTN; hyperlipemia; Rt THA; obesity; back pain; prediabetes; sleep apnea;   PAIN:  05/20/22: Are you having pain? Yes: NPRS scale: 1/10  "nothing to mention" Pain location:  Lt low back Pain description: aching Aggravating factors: standing; walking; steps; lifting Relieving factors: sitting; not moving  PRECAUTIONS: None  PATIENT GOALS   to be pain free, or comfortable    OBJECTIVE:   TODAY'S TREATMENT  05/20/22:  There Ex: Nu-step L6 x 6 minutes LE/UEs Standing: Wall squat 10 sec hold x 5 Antirotation blue TB x 5 each side  Backwards walking 20 feet x 4 reps Seated: Sit to stand slow eccentric stand to sit x 5 Hip flexor stretch seated x 30 seconds x 3 reps  Supine:               Hooklying bilat hip flexion stretch Thomas 30 sec x 2 ea   Trunk rotation supine x 10 reps Manual: STM Lt QL  pt sidelying  - deep tissue release work    05/15/22 Pt seen for aquatic therapy today.  Treatment took place in water 3.25-4.75 ft in depth at the [facility]. Temp of water was 91.  Pt entered/exited the pool via stairs independently with bilat rail.  * Forward / backward walking; high knee marching  forward;  side stepping with arm abdct/ add;  * Holding yellow hand buoys:  SLS with 3 way toe taps x 10 each LE (improved);  squats pushing buoy under water; forward/backward walking with yellow hand buoys at sides under water for core engagement.  * Holding wall:  hip flexor stretch x 20s x 2 - cues for posture and form * Warrior 1, lifting noodle off of water towards ceiling, in staggered stance x 5 each side (good challenge) * Single leg clam holding wall  * leg press/ Yellow noodle stomp with back against wall, then with single UE on wall   05/08/22: Pt seen for aquatic therapy today.  Treatment took place in water 3.25-4.5 ft in depth at the [facility]. Temp of water was 91.  Pt entered/exited the pool via stairs independently with bilat rail. * Holding yellow noodle:  Forward / backward gait with cues for step length and increased knee flexion; side stepping;  * Holding yellow hand buoys:  SLS with toe taps front, side, back x 5 x 2 each LE (good challenge);  squats (cues for form) * Holding wall:  hip flexor stretch x 20s x 2 * Warrior 1, lifting noodle off of water towards ceiling, in staggered stance x 5 each side (good challenge) * tandem gait forward/ backward holding yellow hand buoys * Quad stretch with foot on 2nd step behind him, 20s x 2 each LE     Trigger Point Dry-Needling  04/30/22: Treatment instructions: Expect mild to moderate muscle soreness. S/S of pneumothorax if dry needled over a lung field, and to seek immediate medical attention should they occur. Patient verbalized understanding of these instructions and education.  Patient Consent Given: Yes Education handout provided: Previously provided Muscles treated: QL, lumbar multifidi Electrical stimulation performed: Yes Parameters:  to tolerance, 2 cps, intensity to tolerance, millicurrent Treatment response/outcome: palpable increased  muscle length Modalities: Moist heat x 10 min  From initial  evaluation: PATIENT SURVEYS:  FOTO 44  MUSCLE LENGTH: Hamstrings: tight Rt ~ 60 deg; Lt 65 deg Hip flexors tight Rt > Lt unable to tolerate [employer] test  POSTURE: rounded shoulders, forward head, decreased lumbar lordosis, increased thoracic kyphosis, posterior pelvic tilt, and flexed trunk   PALPATION: Muscular tightness Lt QL, lats, lumbar paraspinals, iliopsoas  LUMBAR ROM:   Active  A/PROM  eval AROM 05/20/22  Flexion 45% 70%  Extension -10% 5%  Right lateral flexion 35% 65%  Left lateral flexion 30% 55%  Right rotation 15% 30%  Left rotation 10% 25%   (Blank rows = not tested)  LOWER EXTREMITY ROM:     Active  Right eval Right 05/20/22 Left eval Left 05/20/22  Hip flexion tight Tight end range tight Tight end range  Hip extension -10 0 -10 0  Hip abduction tight tight tight Tight   Hip adduction      Hip internal rotation      Hip external rotation      Knee flexion 90  90   Knee extension WNL's  WNL's   Ankle dorsiflexion      Ankle plantarflexion      Ankle inversion      Ankle eversion       (Blank rows = not tested)  LOWER EXTREMITY MMT:    MMT Right eval  Left eval   Hip flexion 4/5 4+/5 4/5 4+/5  Hip extension 4-/5 4/5 4/4 4/5  Hip abduction 4-/5 4+/5 4-/5 4/5  Hip adduction      Hip internal rotation      Hip external rotation      Knee flexion 5/5 5/5 5/5 5/5  Knee extension 5/5 5/5 5/5 5/5  Ankle dorsiflexion      Ankle plantarflexion      Ankle inversion      Ankle eversion       (Blank rows = not tested)  LUMBAR SPECIAL TESTS:  Straight leg raise test: Negative and Slump test: Negative  GAIT: Distance walked: 80 ft Assistive device utilized: None Comments: Improving gait with continued forward flexed trunk and hips   PATIENT EDUCATION:  Education details: aquatic introduction Person educated: Patient Education method: Consulting civil engineer, Media planner, Corporate treasurer cues, Verbal cues Education comprehension: verbalized understanding, returned  demonstration, verbal cues required, tactile cues required, and needs further education   HOME EXERCISE PROGRAM: Access Code: [email or web address] URL: [email or web address] Date: 05/20/2022 Prepared by: Gillermo Murdoch  Exercises - Prone Press Up On Elbows  - 2 x daily - 7 x weekly - 1 sets - 1 reps - 2-4 min  hold - Prone Gluteal Sets  - 2 x daily - 7 x weekly - 1 sets - 10 reps - 10 sec  hold - Prone Quadriceps Stretch with Strap  - 2 x daily - 7 x weekly - 1 sets - 3 reps - 30 sec  hold - Seated Hip Flexor Stretch  - 2 x daily - 7 x weekly - 1 sets - 3 reps - 30 sec  hold - Supine Transversus Abdominis Bracing with Pelvic Floor Contraction  - 2 x daily - 7 x weekly - 1 sets - 10 reps - 10sec  hold - Supine Diaphragmatic Breathing  - 2 x daily - 7 x weekly - 1 sets - 10 reps - 4-6 sec  hold - Standing Hip Extension with Counter Support  - 2 x daily - 7 x weekly -  1-2 sets - 10 reps - 3-5 sec  hold - Standing Piriformis Release with Ball at [employer]  - 2 x daily - 7 x weekly - 30-60 sec  hold - Hooklying Isometric Clamshell  - 2 x daily - 7 x weekly - 1 sets - 10 reps - 3 sec  hold - TL Sidebending Stretch - Single Arm Overhead  - 1 x daily - 7 x weekly - 2 sets - 30 sec hold - Warrior I in [employer] with [employer]  - 1 x daily - 7 x weekly - 1 sets - 5-10 reps - Standing 3-Way Leg Reach  - 1 x daily - 7 x weekly - 1 sets - 10 reps - Single Leg Stance Clamshell  - 1 x daily - 7 x weekly - 1 sets - 10 reps - Side Stepping  - 1 x daily - 7 x weekly - 3 sets - 10 reps - Supine Bridge  - 1 x daily - 7 x weekly - 1-2 sets - 10 reps - 5-10 sec  hold - Wall Quarter Squat  - 2 x daily - 7 x weekly - 1-2 sets - 10 reps - 5-10 sec  hold - Sit to Stand  - 2 x daily - 7 x weekly - 1 sets - 10 reps - 3-5 sec  hold - Anti-Rotation Lateral Stepping with Press  - 2 x daily - 7 x weekly - 1-2 sets - 10 reps - 2-3 sec  hold   ASSESSMENT:  CLINICAL IMPRESSION: 05/20/22: [name] reports good improvement in  pain level with decreased pain and increased functional activities. He is sleeping much better but has some pain upon first getting out of bed. He demonstrated good gains in lumbar and LE ROM and strength. He has continued forward flexed posture and LE weakness. [name] will benefit from continued PT to address problems and work toward maximum rehab potential.   OBJECTIVE IMPAIRMENTS Abnormal gait, decreased activity tolerance, decreased balance, decreased endurance, decreased mobility, decreased ROM, decreased strength, hypomobility, increased fascial restrictions, impaired flexibility, improper body mechanics, postural dysfunction, obesity, and pain.   ACTIVITY LIMITATIONS carrying, lifting, bending, sitting, standing, squatting, transfers, and bed mobility  PARTICIPATION LIMITATIONS: community activity and yard work  [place], Past/current experiences, and 1-2 comorbidities: HTN; Rt THA; prediabetes  are also affecting patient's functional outcome.   REHAB POTENTIAL: Good  CLINICAL DECISION MAKING: Stable/uncomplicated  EVALUATION COMPLEXITY: Low   GOALS: Goals reviewed with patient? Yes   LONG TERM GOALS: Target date: 06/06/2022  Improve posture and alignment with patient to demonstrate neutral spinal and hip alignment in standing Baseline:  Goal status: ongoing  2.  Decrease back pain by 50-75% allowing patient to participate in normal functional activities with less pain and limitation  Baseline:  Goal status: on going  3.  Increase spinal extension to 10-20% range in standing  Baseline:  Goal status: on going  4.  Independent in HEP (including aquatic program) Baseline:  Goal status: on going  5.  Improve functional limitation score to 54 Baseline:  Goal status: on going     PLAN: PT FREQUENCY: 2x/wk  PT DURATION: 6 weeks  PLANNED INTERVENTIONS: Therapeutic exercises, Therapeutic activity, Neuromuscular re-education, Balance training, Gait training,  Patient/Family education, Self Care, Joint mobilization, Aquatic Therapy, Dry Needling, Electrical stimulation, Spinal mobilization, Cryotherapy, Moist heat, Taping, Manual therapy, and Re-evaluation.  PLAN FOR NEXT SESSION: review and progress with HEP; continue of DN to Lt QL, lats, lumbar  spine; progress with dynamic stabilization; continue education and home instruction; modalities as indicated.    Stavros Cail P. Helene Kelp, PT, MPH  05/20/22 8:47 AM

## 2022-05-22 ENCOUNTER — Ambulatory Visit (HOSPITAL_BASED_OUTPATIENT_CLINIC_OR_DEPARTMENT_OTHER): Payer: Medicare PPO | Attending: Family Medicine | Admitting: Physical Therapy

## 2022-05-22 ENCOUNTER — Encounter (HOSPITAL_BASED_OUTPATIENT_CLINIC_OR_DEPARTMENT_OTHER): Payer: Self-pay | Admitting: Physical Therapy

## 2022-05-22 DIAGNOSIS — R29898 Other symptoms and signs involving the musculoskeletal system: Secondary | ICD-10-CM | POA: Diagnosis not present

## 2022-05-22 DIAGNOSIS — M5459 Other low back pain: Secondary | ICD-10-CM | POA: Insufficient documentation

## 2022-05-22 NOTE — Therapy (Signed)
OUTPATIENT PHYSICAL THERAPY TREATMENT   Patient Name: Martell Mcfadyen MRN: [id number] DOB:[birth year], 71 y.o., male Today's Date: 05/22/2022   PT End of Session - 05/22/22 0954     Visit Number 11    Number of Visits 12    Date for PT Re-Evaluation 05/28/22    PT Start Time 0945    PT Stop Time 1025    PT Time Calculation (min) 40 min    Activity Tolerance Patient tolerated treatment well    Behavior During Therapy [facility] for tasks assessed/performed              Past Medical History:  Diagnosis Date   Arthritis    Back pain    Diverticulosis of colon without hemorrhage 06/12/2016   On Colonoscopy 11/06/2010   HLD (hyperlipidemia) 05/29/2016   Hyperlipemia    Hypertension    Joint pain    Morbid obesity ([place]) 05/29/2016   Prediabetes    Right hip pain    needs hip replacement   Sleep apnea    on CPAP   Past Surgical History:  Procedure Laterality Date   KNEE SURGERY     REPLACEMENT TOTAL HIP W/  RESURFACING IMPLANTS Right 78/5885   Dr. Aleda Grana   Patient Active Problem List   Diagnosis Date Noted   Acute pancreatitis 04/07/2022   OSA (obstructive sleep apnea) 02/19/2022   Gout 12/24/2021   Chronic rhinitis 08/21/2021   Impotence of organic origin 02/13/2021   Lumbar spondylosis 10/09/2020   Pre-operative exam 07/18/2020   Verruca 05/01/2020   Epiploic appendagitis 02/20/2020   Bilateral shoulder region arthritis 04/13/2019   Vitamin D deficiency 02/08/2019   Insulin resistance 02/08/2019   Class 2 severe obesity with serious comorbidity and body mass index (BMI) of 39.0 to 39.9 in adult ([place]) 02/08/2019   Prediabetes 02/08/2019   Chronic pain 09/28/2018   Hip pain 06/28/2018   Dependent edema 05/05/2017   Primary osteoarthritis of left knee 09/25/2016   BPH (benign prostatic hyperplasia) 05/29/2016   Urine frequency 05/29/2016   HTN (hypertension) 05/29/2016   HLD (hyperlipidemia) 05/29/2016   Class 2 obesity due to excess calories without serious  comorbidity with body mass index (BMI) of 39.0 to 39.9 in adult 05/29/2016   Sleep apnea 03/31/2014    PCP: Dr Luetta Nutting  REFERRING PROVIDER: Dr Luetta Nutting  REFERRING DIAG: Acute LBP  Rationale for Evaluation and Treatment Rehabilitation  THERAPY DIAG:  Other low back pain  Other symptoms and signs involving the musculoskeletal system  ONSET DATE: 04/07/22; history of LBP for ~ 5 years   SUBJECTIVE:  SUBJECTIVE STATEMENT: "When Celyn put that elbow in my hip last visit, that really helped".   PERTINENT HISTORY:  HTN; hyperlipemia; Rt THA; obesity; back pain; prediabetes; sleep apnea;   PAIN:  05/20/22: Are you having pain? no: NPRS scale: 0/10  Pain location:   Pain description:  Aggravating factors: first thing in morning; prolonged standing; walking; steps; lifting Relieving factors: sitting; not moving  PRECAUTIONS: None  PATIENT GOALS   to be pain free, or comfortable    OBJECTIVE:   TODAY'S TREATMENT:  05/22/22 Pt seen for aquatic therapy today.  Treatment took place in water 3.25-4.75 ft in depth at the [facility]. Temp of water was 92.  Pt entered/exited the pool via stairs independently with bilat rail.  * Forward / backward walking;  side stepping  * Holding yellow hand buoys- side stepping with arm / leg abdct/ add R/L; tandem gait forward / backward; forward/backward walking with yellow hand buoys at sides under water for core engagement. * holding wall:  single leg clam x 10 (unable to complete with noodle)   * Holding yellow noodle:  SLS with 3 way toe taps x 10 each LE; hip flexor stretch x 20s x 2 - cues for posture and form * staggered stance kick board row - playing with stance distance and speed * STS from 4th step without UE support and 5 count  descent * Plank with hip ext (hands on bench in water) * Warrior 1, lifting noodle off of water towards ceiling, in staggered stance x 5 each side (improved)  05/20/22:  There Ex: Nu-step L6 x 6 minutes LE/UEs Standing: Wall squat 10 sec hold x 5 Antirotation blue TB x 5 each side  Backwards walking 20 feet x 4 reps Seated: Sit to stand slow eccentric stand to sit x 5 Hip flexor stretch seated x 30 seconds x 3 reps  Supine:               Hooklying bilat hip flexion stretch Thomas 30 sec x 2 ea   Trunk rotation supine x 10 reps Manual: STM Lt QL  pt sidelying  - deep tissue release work    05/15/22 Pt seen for aquatic therapy today.  Treatment took place in water 3.25-4.75 ft in depth at the [facility]. Temp of water was 91.  Pt entered/exited the pool via stairs independently with bilat rail.  * Forward / backward walking; high knee marching forward;  side stepping with arm abdct/ add;  * Holding yellow hand buoys:  SLS with 3 way toe taps x 10 each LE (improved);  squats pushing buoy under water; forward/backward walking with yellow hand buoys at sides under water for core engagement.  * Holding wall:  hip flexor stretch x 20s x 2 - cues for posture and form * Warrior 1, lifting noodle off of water towards ceiling, in staggered stance x 5 each side (good challenge) * Single leg clam holding wall  * leg press/ Yellow noodle stomp with back against wall, then with single UE on wall   05/08/22: Pt seen for aquatic therapy today.  Treatment took place in water 3.25-4.5 ft in depth at the [facility]. Temp of water was 91.  Pt entered/exited the pool via stairs independently with bilat rail. * Holding yellow noodle:  Forward / backward gait with cues for step length and increased knee flexion; side stepping;  * Holding yellow hand buoys:  SLS with toe taps front, side, back  x 5 x 2 each LE (good challenge);  squats (cues for form) * Holding wall:  hip flexor  stretch x 20s x 2 * Warrior 1, lifting noodle off of water towards ceiling, in staggered stance x 5 each side (good challenge) * tandem gait forward/ backward holding yellow hand buoys * Quad stretch with foot on 2nd step behind him, 20s x 2 each LE   From initial evaluation: PATIENT SURVEYS:  FOTO 44  MUSCLE LENGTH: Hamstrings: tight Rt ~ 60 deg; Lt 65 deg Hip flexors tight Rt > Lt unable to tolerate [employer] test  POSTURE: rounded shoulders, forward head, decreased lumbar lordosis, increased thoracic kyphosis, posterior pelvic tilt, and flexed trunk   PALPATION: Muscular tightness Lt QL, lats, lumbar paraspinals, iliopsoas  LUMBAR ROM:   Active  A/PROM  eval AROM 05/20/22  Flexion 45% 70%  Extension -10% 5%  Right lateral flexion 35% 65%  Left lateral flexion 30% 55%  Right rotation 15% 30%  Left rotation 10% 25%   (Blank rows = not tested)  LOWER EXTREMITY ROM:     Active  Right eval Right 05/20/22 Left eval Left 05/20/22  Hip flexion tight Tight end range tight Tight end range  Hip extension -10 0 -10 0  Hip abduction tight tight tight Tight   Hip adduction      Hip internal rotation      Hip external rotation      Knee flexion 90  90   Knee extension WNL's  WNL's   Ankle dorsiflexion      Ankle plantarflexion      Ankle inversion      Ankle eversion       (Blank rows = not tested)  LOWER EXTREMITY MMT:    MMT Right eval  Left eval   Hip flexion 4/5 4+/5 4/5 4+/5  Hip extension 4-/5 4/5 4/4 4/5  Hip abduction 4-/5 4+/5 4-/5 4/5  Hip adduction      Hip internal rotation      Hip external rotation      Knee flexion 5/5 5/5 5/5 5/5  Knee extension 5/5 5/5 5/5 5/5  Ankle dorsiflexion      Ankle plantarflexion      Ankle inversion      Ankle eversion       (Blank rows = not tested)  LUMBAR SPECIAL TESTS:  Straight leg raise test: Negative and Slump test: Negative  GAIT: Distance walked: 80 ft Assistive device utilized: None Comments: Improving gait  with continued forward flexed trunk and hips   PATIENT EDUCATION:  Education details: aquatic HEP Person educated: Patient Education method: Consulting civil engineer, Media planner, Corporate treasurer cues, Verbal cues - issued laminated HEP Education comprehension: verbalized understanding, returned demonstration, verbal cues required, tactile cues required, and needs further education   HOME EXERCISE PROGRAM: Access Code: [email or web address] URL: [email or web address] Date: 05/20/2022 Prepared by: Gillermo Murdoch  Exercises - Prone Press Up On Elbows  - 2 x daily - 7 x weekly - 1 sets - 1 reps - 2-4 min  hold - Prone Gluteal Sets  - 2 x daily - 7 x weekly - 1 sets - 10 reps - 10 sec  hold - Prone Quadriceps Stretch with Strap  - 2 x daily - 7 x weekly - 1 sets - 3 reps - 30 sec  hold - Seated Hip Flexor Stretch  - 2 x daily - 7 x weekly - 1 sets - 3 reps - 30 sec  hold - Supine Transversus Abdominis  Bracing with Pelvic Floor Contraction  - 2 x daily - 7 x weekly - 1 sets - 10 reps - 10sec  hold - Supine Diaphragmatic Breathing  - 2 x daily - 7 x weekly - 1 sets - 10 reps - 4-6 sec  hold - Standing Hip Extension with Counter Support  - 2 x daily - 7 x weekly - 1-2 sets - 10 reps - 3-5 sec  hold - Standing Piriformis Release with Ball at [employer]  - 2 x daily - 7 x weekly - 30-60 sec  hold - Hooklying Isometric Clamshell  - 2 x daily - 7 x weekly - 1 sets - 10 reps - 3 sec  hold - TL Sidebending Stretch - Single Arm Overhead  - 1 x daily - 7 x weekly - 2 sets - 30 sec hold - Warrior I in [employer] with [employer]  - 1 x daily - 7 x weekly - 1 sets - 5-10 reps - Standing 3-Way Leg Reach  - 1 x daily - 7 x weekly - 1 sets - 10 reps - Single Leg Stance Clamshell  - 1 x daily - 7 x weekly - 1 sets - 10 reps - Side Stepping  - 1 x daily - 7 x weekly - 3 sets - 10 reps - Supine Bridge  - 1 x daily - 7 x weekly - 1-2 sets - 10 reps - 5-10 sec  hold - Wall Quarter Squat  - 2 x daily - 7 x weekly - 1-2 sets - 10 reps -  5-10 sec  hold - Sit to Stand  - 2 x daily - 7 x weekly - 1 sets - 10 reps - 3-5 sec  hold - Anti-Rotation Lateral Stepping with Press  - 2 x daily - 7 x weekly - 1-2 sets - 10 reps - 2-3 sec  hold  Aquatic HEP is listed under same code in [place]:  CLINICAL IMPRESSION: Pt able to complete new strengthening / balance exercises without difficulty.  He reported "small twinge" in Lt lower back after completing plank with hip ext; resolved with returning to walking in deeper water.  He did not require as many cues for upright posture as he did last session.  [name] will benefit from continued PT to address problems and work toward maximum rehab potential. Progressing towards therapy goals.   OBJECTIVE IMPAIRMENTS Abnormal gait, decreased activity tolerance, decreased balance, decreased endurance, decreased mobility, decreased ROM, decreased strength, hypomobility, increased fascial restrictions, impaired flexibility, improper body mechanics, postural dysfunction, obesity, and pain.   ACTIVITY LIMITATIONS carrying, lifting, bending, sitting, standing, squatting, transfers, and bed mobility  PARTICIPATION LIMITATIONS: community activity and yard work  [place], Past/current experiences, and 1-2 comorbidities: HTN; Rt THA; prediabetes  are also affecting patient's functional outcome.   REHAB POTENTIAL: Good  CLINICAL DECISION MAKING: Stable/uncomplicated  EVALUATION COMPLEXITY: Low   GOALS: Goals reviewed with patient? Yes   LONG TERM GOALS: Target date: 06/06/2022  Improve posture and alignment with patient to demonstrate neutral spinal and hip alignment in standing Baseline:  Goal status: ongoing  2.  Decrease back pain by 50-75% allowing patient to participate in normal functional activities with less pain and limitation  Baseline:  Goal status: on going  3.  Increase spinal extension to 10-20% range in standing  Baseline:  Goal status: on going  4.   Independent in HEP (including aquatic program) Baseline:  Goal status: on going  5.  Improve functional limitation score to 54 Baseline:  Goal status: on going     PLAN: PT FREQUENCY: 2x/wk  PT DURATION: 6 weeks  PLANNED INTERVENTIONS: Therapeutic exercises, Therapeutic activity, Neuromuscular re-education, Balance training, Gait training, Patient/Family education, Self Care, Joint mobilization, Aquatic Therapy, Dry Needling, Electrical stimulation, Spinal mobilization, Cryotherapy, Moist heat, Taping, Manual therapy, and Re-evaluation.  PLAN FOR NEXT SESSION: review and progress with HEP; continue of DN to Lt QL, lats, lumbar spine; progress with dynamic stabilization; continue education and home instruction; modalities as indicated.   Aquatics: trial ai chi gathering and leg press with yellow noodle  Kerin Perna, PTA 05/22/22 10:25 AM

## 2022-05-25 ENCOUNTER — Other Ambulatory Visit: Payer: Self-pay | Admitting: Family Medicine

## 2022-05-27 ENCOUNTER — Encounter: Payer: Self-pay | Admitting: Rehabilitative and Restorative Service Providers"

## 2022-05-27 ENCOUNTER — Ambulatory Visit: Payer: Medicare PPO | Admitting: Rehabilitative and Restorative Service Providers"

## 2022-05-27 DIAGNOSIS — M5459 Other low back pain: Secondary | ICD-10-CM

## 2022-05-27 DIAGNOSIS — R29898 Other symptoms and signs involving the musculoskeletal system: Secondary | ICD-10-CM | POA: Diagnosis not present

## 2022-05-27 NOTE — Therapy (Signed)
OUTPATIENT PHYSICAL THERAPY TREATMENT   Patient Name: John Morrow MRN: [id number] DOB:October 18, 1950, 71 y.o., male Today's Date: 05/27/2022   PT End of Session - 05/27/22 0846     Visit Number 12    Number of Visits 24    Date for PT Re-Evaluation 07/22/22    PT Start Time 0844    PT Stop Time 0930    PT Time Calculation (min) 46 min    Activity Tolerance Patient tolerated treatment well              Past Medical History:  Diagnosis Date   Arthritis    Back pain    Diverticulosis of colon without hemorrhage 06/12/2016   On Colonoscopy 11/06/2010   HLD (hyperlipidemia) 05/29/2016   Hyperlipemia    Hypertension    Joint pain    Morbid obesity ([place]) 05/29/2016   Prediabetes    Right hip pain    needs hip replacement   Sleep apnea    on CPAP   Past Surgical History:  Procedure Laterality Date   KNEE SURGERY     REPLACEMENT TOTAL HIP W/  RESURFACING IMPLANTS Right 65/7846   Dr. Aleda Grana   Patient Active Problem List   Diagnosis Date Noted   Acute pancreatitis 04/07/2022   OSA (obstructive sleep apnea) 02/19/2022   Gout 12/24/2021   Chronic rhinitis 08/21/2021   Impotence of organic origin 02/13/2021   Lumbar spondylosis 10/09/2020   Pre-operative exam 07/18/2020   Verruca 05/01/2020   Epiploic appendagitis 02/20/2020   Bilateral shoulder region arthritis 04/13/2019   Vitamin D deficiency 02/08/2019   Insulin resistance 02/08/2019   Class 2 severe obesity with serious comorbidity and body mass index (BMI) of 39.0 to 39.9 in adult ([place]) 02/08/2019   Prediabetes 02/08/2019   Chronic pain 09/28/2018   Hip pain 06/28/2018   Dependent edema 05/05/2017   Primary osteoarthritis of left knee 09/25/2016   BPH (benign prostatic hyperplasia) 05/29/2016   Urine frequency 05/29/2016   HTN (hypertension) 05/29/2016   HLD (hyperlipidemia) 05/29/2016   Class 2 obesity due to excess calories without serious comorbidity with body mass index (BMI) of 39.0 to 39.9 in  adult 05/29/2016   Sleep apnea 03/31/2014    PCP: Dr Luetta Nutting  REFERRING PROVIDER: Dr Luetta Nutting  REFERRING DIAG: Acute LBP  Rationale for Evaluation and Treatment Rehabilitation  THERAPY DIAG:  Other low back pain  Other symptoms and signs involving the musculoskeletal system  ONSET DATE: 04/07/22; history of LBP for ~ 5 years   SUBJECTIVE:  SUBJECTIVE STATEMENT: 05/27/22: Doing well overall. Still has some discomfort in the mornings and sometimes at other times  PERTINENT HISTORY:  HTN; hyperlipemia; Rt THA; obesity; back pain; prediabetes; sleep apnea;   PAIN:  05/27/22: Are you having pain? no: NPRS scale: 0/10  Pain location:   Pain description:  Aggravating factors: first thing in morning; prolonged standing; walking; steps; lifting Relieving factors: sitting; not moving  PRECAUTIONS: None  PATIENT GOALS   to be pain free, or comfortable    OBJECTIVE:   TODAY'S TREATMENT:  05/27/22 There Ex: Nu-step L6 x 10 minutes LE/UEs (UE 10) Standing: Shoulder extension blue TB x 10 reps x 2 sets Shoulder rowing blue TB x 10 reps x 2 sets Bow and arrow blue TB x 10 reps x 2 sets Antirotation blue TB x 10 reps x 2 sets each side  Wall squat 10 sec hold x 5 Backwards walking 20 feet x 4 reps Standing back at wall for glut set/hip extension ~ 2 min  Seated: Sit to stand slow eccentric stand to sit x 5 Hip flexor stretch seated x 30 seconds x 3 reps  Supine:               Hooklying bilat hip flexion stretch Thomas 30 sec x 2 ea   Trunk rotation supine x 10 reps Manual: STM Lt QL  pt sidelying  - deep tissue release work      Aquatic Therapy  05/22/22: Pt seen for aquatic therapy today.  Treatment took place in water 3.25-4.75 ft in depth at the [facility]. Temp  of water was 92.  Pt entered/exited the pool via stairs independently with bilat rail. * Forward / backward walking;  side stepping  * Holding yellow hand buoys- side stepping with arm / leg abdct/ add R/L; tandem gait forward / backward; forward/backward walking with yellow hand buoys at sides under water for core engagement. * holding wall:  single leg clam x 10 (unable to complete with noodle)   * Holding yellow noodle:  SLS with 3 way toe taps x 10 each LE; hip flexor stretch x 20s x 2 - cues for posture and form * staggered stance kick board row - playing with stance distance and speed * STS from 4th step without UE support and 5 count descent * Plank with hip ext (hands on bench in water) * Warrior 1, lifting noodle off of water towards ceiling, in staggered stance x 5 each side (improved)   From initial evaluation: PATIENT SURVEYS:  FOTO 44  MUSCLE LENGTH: Hamstrings: tight Rt ~ 60 deg; Lt 65 deg Hip flexors tight Rt > Lt unable to tolerate [employer] test  POSTURE: rounded shoulders, forward head, decreased lumbar lordosis, increased thoracic kyphosis, posterior pelvic tilt, and flexed trunk   PALPATION: Muscular tightness Lt QL, lats, lumbar paraspinals, iliopsoas  LUMBAR ROM:   Active  A/PROM  eval AROM 05/27/22  Flexion 45% 70%  Extension -10% 5%  Right lateral flexion 35% 65%  Left lateral flexion 30% 55%  Right rotation 15% 30%  Left rotation 10% 25%   (Blank rows = not tested)  LOWER EXTREMITY ROM:     Active  Right eval Right 05/27/22 Left eval Left 05/27/22  Hip flexion tight Tight end range tight Tight end range  Hip extension -10 0 -10 0  Hip abduction tight tight tight Tight   Hip adduction      Hip internal rotation      Hip external  rotation      Knee flexion 90  90   Knee extension WNL's  WNL's   Ankle dorsiflexion      Ankle plantarflexion      Ankle inversion      Ankle eversion       (Blank rows = not tested)  LOWER EXTREMITY MMT:    MMT  Right eval  Left eval   Hip flexion 4/5 4+/5 4/5 4+/5  Hip extension 4-/5 4/5 4/4 4/5  Hip abduction 4-/5 4+/5 4-/5 4/5  Hip adduction      Hip internal rotation      Hip external rotation      Knee flexion 5/5 5/5 5/5 5/5  Knee extension 5/5 5/5 5/5 5/5  Ankle dorsiflexion      Ankle plantarflexion      Ankle inversion      Ankle eversion       (Blank rows = not tested)  LUMBAR SPECIAL TESTS:  Straight leg raise test: Negative and Slump test: Negative  GAIT: Distance walked: 80 ft Assistive device utilized: None Comments: Improving gait with continued forward flexed trunk and hips   PATIENT EDUCATION:  Education details: aquatic HEP Person educated: Patient Education method: Consulting civil engineer, Media planner, Corporate treasurer cues, Verbal cues - issued laminated HEP Education comprehension: verbalized understanding, returned demonstration, verbal cues required, tactile cues required, and needs further education   HOME EXERCISE PROGRAM:  Access Code: [email or web address] URL: [email or web address] Date: 05/27/2022 Prepared by: Gillermo Murdoch  Exercises - Prone Press Up On Elbows  - 2 x daily - 7 x weekly - 1 sets - 1 reps - 2-4 min  hold - Prone Gluteal Sets  - 2 x daily - 7 x weekly - 1 sets - 10 reps - 10 sec  hold - Prone Quadriceps Stretch with Strap  - 2 x daily - 7 x weekly - 1 sets - 3 reps - 30 sec  hold - Seated Hip Flexor Stretch  - 2 x daily - 7 x weekly - 1 sets - 3 reps - 30 sec  hold - Supine Bridge  - 1 x daily - 7 x weekly - 1-2 sets - 10 reps - 5-10 sec  hold - Supine Transversus Abdominis Bracing with Pelvic Floor Contraction  - 2 x daily - 7 x weekly - 1 sets - 10 reps - 10sec  hold - Supine Diaphragmatic Breathing  - 2 x daily - 7 x weekly - 1 sets - 10 reps - 4-6 sec  hold - Standing Hip Extension with Counter Support  - 2 x daily - 7 x weekly - 1-2 sets - 10 reps - 3-5 sec  hold - Standing Piriformis Release with Ball at [employer]  - 2 x daily - 7 x weekly - 30-60 sec   hold - Hooklying Isometric Clamshell  - 2 x daily - 7 x weekly - 1 sets - 10 reps - 3 sec  hold - Wall Quarter Squat  - 2 x daily - 7 x weekly - 1-2 sets - 10 reps - 5-10 sec  hold - Anti-Rotation Lateral Stepping with Press  - 2 x daily - 7 x weekly - 1-2 sets - 10 reps - 2-3 sec  hold - TL Sidebending Stretch - Single Arm Overhead  - 1 x daily - 7 x weekly - 2 sets - 10-30 sec hold - Side Stepping  - 1 x daily - 3 x weekly - 3 sets - 10 reps - Backwards Walking  -  1 x daily - 3 x weekly - 3 sets - Walking Tandem Stance  - 1 x daily - 3 x weekly - 3 sets - Standing 3-Way Leg Reach  - 1 x daily - 3 x weekly - 1 sets - 10 reps - Split Stance Shoulder Row with Resistance  - 1 x daily - 3 x weekly - 2 sets - 10 reps - Single Leg Stance Clamshell  - 1 x daily - 3 x weekly - 1 sets - 10 reps - Warrior I in [employer] with [employer]  - 1 x daily - 3 x weekly - 1 sets - 5-10 reps - Sit to Stand  - 1 x daily - 7 x weekly - 1 sets - 10 reps - 3-5 sec  hold - Plank with Hip Extension at [employer]  - 1 x daily - 3 x weekly - 1 sets - 10 reps - Drawing Bow  - 1 x daily - 7 x weekly - 1 sets - 10 reps - 3 sec  hold - Modified Deadlift with Pelvic Contraction  - 1 x daily - 7 x weekly - 1 sets - 10 reps Aquatic HEP is listed under same code in [place]:  CLINICAL IMPRESSION: Pt continues to report improvement in symptoms with less pain and discomfort. He is progressing well with strengthening / balance exercises on land and in aquatics program without difficulty. He continues to demonstrate forward flexed posture with hip flexion.  John Morrow will benefit from continued PT to address problems and work toward maximum rehab potential. Progressing towards therapy goals.   OBJECTIVE IMPAIRMENTS Abnormal gait, decreased activity tolerance, decreased balance, decreased endurance, decreased mobility, decreased ROM, decreased strength, hypomobility, increased fascial restrictions, impaired flexibility,  improper body mechanics, postural dysfunction, obesity, and pain.   ACTIVITY LIMITATIONS carrying, lifting, bending, sitting, standing, squatting, transfers, and bed mobility  PARTICIPATION LIMITATIONS: community activity and yard work  [place], Past/current experiences, and 1-2 comorbidities: HTN; Rt THA; prediabetes  are also affecting patient's functional outcome.   REHAB POTENTIAL: Good  CLINICAL DECISION MAKING: Stable/uncomplicated  EVALUATION COMPLEXITY: Low   GOALS: Goals reviewed with patient? Yes   LONG TERM GOALS: Target date: 07/22/2022  Improve posture and alignment with patient to demonstrate neutral spinal and hip alignment in standing Baseline:  Goal status: ongoing  2.  Decrease back pain by 50-75% allowing patient to participate in normal functional activities with less pain and limitation  Baseline:  Goal status: on going  3.  Increase spinal extension to 10-20% range in standing  Baseline:  Goal status: on going  4.  Independent in HEP (including aquatic program) Baseline:  Goal status: on going  5.  Improve functional limitation score to 54 Baseline:  Goal status: on going     PLAN: PT FREQUENCY: 2x/wk  PT DURATION: 6 weeks  PLANNED INTERVENTIONS: Therapeutic exercises, Therapeutic activity, Neuromuscular re-education, Balance training, Gait training, Patient/Family education, Self Care, Joint mobilization, Aquatic Therapy, Dry Needling, Electrical stimulation, Spinal mobilization, Cryotherapy, Moist heat, Taping, Manual therapy, and Re-evaluation.  PLAN FOR NEXT SESSION: review and progress with HEP; continue of DN to Lt QL, lats, lumbar spine; progress with dynamic stabilization; continue education and home instruction; modalities as indicated.   Aquatics: trial ai chi gathering and leg press with yellow noodle  John Morrow, PT, MPH  05/27/22 8:48 AM

## 2022-05-29 ENCOUNTER — Ambulatory Visit (INDEPENDENT_AMBULATORY_CARE_PROVIDER_SITE_OTHER): Payer: Medicare PPO | Admitting: Family Medicine

## 2022-05-29 DIAGNOSIS — Z23 Encounter for immunization: Secondary | ICD-10-CM | POA: Diagnosis not present

## 2022-06-03 ENCOUNTER — Ambulatory Visit (AMBULATORY_SURGERY_CENTER): Payer: Medicare PPO

## 2022-06-03 VITALS — Ht 70.0 in | Wt 290.0 lb

## 2022-06-03 DIAGNOSIS — Z86010 Personal history of colonic polyps: Secondary | ICD-10-CM

## 2022-06-03 MED ORDER — PEG 3350-KCL-NA BICARB-NACL 420 G PO SOLR: 4000.0000 mL | Freq: Once | ORAL | 0 refills | Status: AC

## 2022-06-03 NOTE — Progress Notes (Signed)
No egg or soy allergy known to patient  No issues known to pt with past sedation with any surgeries or procedures Patient denies ever being told they had issues or difficulty with intubation  No FH of Malignant Hyperthermia Pt is not on diet pills Pt is not on  home 02  Pt is not on blood thinners  Pt denies issues with constipation  No A fib or A flutter Have any cardiac testing pending--denied Pt instructed to use [email or web address] or GoodRx for a price reduction on prep   

## 2022-06-04 ENCOUNTER — Encounter (HOSPITAL_BASED_OUTPATIENT_CLINIC_OR_DEPARTMENT_OTHER): Payer: Self-pay | Admitting: Physical Therapy

## 2022-06-04 ENCOUNTER — Ambulatory Visit (HOSPITAL_BASED_OUTPATIENT_CLINIC_OR_DEPARTMENT_OTHER): Payer: Medicare PPO | Admitting: Physical Therapy

## 2022-06-04 DIAGNOSIS — R29898 Other symptoms and signs involving the musculoskeletal system: Secondary | ICD-10-CM

## 2022-06-04 DIAGNOSIS — M5459 Other low back pain: Secondary | ICD-10-CM

## 2022-06-04 NOTE — Therapy (Signed)
OUTPATIENT PHYSICAL THERAPY TREATMENT   Patient Name: John Morrow MRN: [id number] DOB:December 12, 1950, 71 y.o., male Today's Date: 06/04/2022   PT End of Session - 06/04/22 1030     Visit Number 13    Number of Visits 24    Date for PT Re-Evaluation 07/22/22    Authorization Time Period 05/27/22-07/22/22    Authorization - Visit Number 2    Authorization - Number of Visits 12    Progress Note Due on Visit 20    PT Start Time 1024    PT Stop Time 1108    PT Time Calculation (min) 44 min    Activity Tolerance Patient tolerated treatment well    Behavior During Therapy [facility] for tasks assessed/performed              Past Medical History:  Diagnosis Date   Arthritis    Back pain    Diverticulosis of colon without hemorrhage 06/12/2016   On Colonoscopy 11/06/2010   HLD (hyperlipidemia) 05/29/2016   Hyperlipemia    Hypertension    Joint pain    Morbid obesity ([place]) 05/29/2016   Prediabetes    Right hip pain    needs hip replacement   Sleep apnea    on CPAP   Past Surgical History:  Procedure Laterality Date   EYE SURGERY     age 46   KNEE SURGERY     REPLACEMENT TOTAL HIP W/  RESURFACING IMPLANTS Right 88/5027   Dr. Aleda Grana   Patient Active Problem List   Diagnosis Date Noted   Acute pancreatitis 04/07/2022   OSA (obstructive sleep apnea) 02/19/2022   Gout 12/24/2021   Chronic rhinitis 08/21/2021   Impotence of organic origin 02/13/2021   Lumbar spondylosis 10/09/2020   Pre-operative exam 07/18/2020   Verruca 05/01/2020   Epiploic appendagitis 02/20/2020   Bilateral shoulder region arthritis 04/13/2019   Vitamin D deficiency 02/08/2019   Insulin resistance 02/08/2019   Class 2 severe obesity with serious comorbidity and body mass index (BMI) of 39.0 to 39.9 in adult ([place]) 02/08/2019   Prediabetes 02/08/2019   Chronic pain 09/28/2018   Hip pain 06/28/2018   Dependent edema 05/05/2017   Primary osteoarthritis of left knee 09/25/2016   BPH (benign  prostatic hyperplasia) 05/29/2016   Urine frequency 05/29/2016   HTN (hypertension) 05/29/2016   HLD (hyperlipidemia) 05/29/2016   Class 2 obesity due to excess calories without serious comorbidity with body mass index (BMI) of 39.0 to 39.9 in adult 05/29/2016   Sleep apnea 03/31/2014    PCP: Dr Luetta Nutting  REFERRING PROVIDER: Dr Luetta Nutting  REFERRING DIAG: Acute LBP  Rationale for Evaluation and Treatment Rehabilitation  THERAPY DIAG:  Other low back pain  Other symptoms and signs involving the musculoskeletal system  ONSET DATE: 04/07/22; history of LBP for ~ 5 years   SUBJECTIVE:  SUBJECTIVE STATEMENT: Pt reports no new changes.  He has gone to gym to lift weights a few times since last visit. He likes the leg press and ab/back machine, "It feels good".    PERTINENT HISTORY:  HTN; hyperlipemia; Rt THA; obesity; back pain; prediabetes; sleep apnea;   PAIN:  Are you having pain? yes: NPRS scale: 2/10  Pain location:  Left lateral hip  Pain description: sharp with certain movements Aggravating factors: first thing in morning; prolonged standing;  Relieving factors: sitting; movement  PRECAUTIONS: None  PATIENT GOALS   to be pain free, or comfortable    OBJECTIVE:   TODAY'S TREATMENT:  Aquatic Therapy  06/04/22: Pt seen for aquatic therapy today.  Treatment took place in water 3.25-4.75 ft in depth at the [facility]. Temp of water was 92.  Pt entered/exited the pool via stairs independently with bilat rail. * Forward / backward walking;  * side stepping with rainbow hand buoys arms abdct/add * tandem gait forward/ backward holding rainbow buoys on surface * Holding yellow noodle:  SLS with 3 way toe taps x 10 each LE; ( 3 way leg kick too challenging )  * holding  wall:  single leg clam x 10, curtsy lunges;  * staggered stance kick board row - playing with stance distance and speed * forward/backward walking with yellow/ rainbow hand buoys at sides under water for core engagement. * back against wall: yellow noodle single leg press x 10  (unable to balance without back on wall) * Warrior 1, lifting noodle off of water towards ceiling, in staggered stance x 5 each side * Plank on wall with hip ext x 5 each * hip flexor stretch x 20s x 2 - cues for posture and form   05/27/22 There Ex: Nu-step L6 x 10 minutes LE/UEs (UE 10) Standing: Shoulder extension blue TB x 10 reps x 2 sets Shoulder rowing blue TB x 10 reps x 2 sets Bow and arrow blue TB x 10 reps x 2 sets Antirotation blue TB x 10 reps x 2 sets each side  Wall squat 10 sec hold x 5 Backwards walking 20 feet x 4 reps Standing back at wall for glut set/hip extension ~ 2 min  Seated: Sit to stand slow eccentric stand to sit x 5 Hip flexor stretch seated x 30 seconds x 3 reps  Supine:               Hooklying bilat hip flexion stretch Thomas 30 sec x 2 ea   Trunk rotation supine x 10 reps Manual: STM Lt QL  pt sidelying  - deep tissue release work      From initial evaluation: PATIENT SURVEYS:  FOTO 44  MUSCLE LENGTH: Hamstrings: tight Rt ~ 60 deg; Lt 65 deg Hip flexors tight Rt > Lt unable to tolerate [employer] test  POSTURE: rounded shoulders, forward head, decreased lumbar lordosis, increased thoracic kyphosis, posterior pelvic tilt, and flexed trunk   PALPATION: Muscular tightness Lt QL, lats, lumbar paraspinals, iliopsoas  LUMBAR ROM:   Active  A/PROM  eval AROM 05/27/22  Flexion 45% 70%  Extension -10% 5%  Right lateral flexion 35% 65%  Left lateral flexion 30% 55%  Right rotation 15% 30%  Left rotation 10% 25%   (Blank rows = not tested)  LOWER EXTREMITY ROM:     Active  Right eval Right 05/27/22 Left eval Left 05/27/22  Hip flexion tight Tight end range tight  Tight end range  Hip  extension -10 0 -10 0  Hip abduction tight tight tight Tight   Hip adduction      Hip internal rotation      Hip external rotation      Knee flexion 90  90   Knee extension WNL's  WNL's   Ankle dorsiflexion      Ankle plantarflexion      Ankle inversion      Ankle eversion       (Blank rows = not tested)  LOWER EXTREMITY MMT:    MMT Right eval  Left eval   Hip flexion 4/5 4+/5 4/5 4+/5  Hip extension 4-/5 4/5 4/4 4/5  Hip abduction 4-/5 4+/5 4-/5 4/5  Hip adduction      Hip internal rotation      Hip external rotation      Knee flexion 5/5 5/5 5/5 5/5  Knee extension 5/5 5/5 5/5 5/5  Ankle dorsiflexion      Ankle plantarflexion      Ankle inversion      Ankle eversion       (Blank rows = not tested)  LUMBAR SPECIAL TESTS:  Straight leg raise test: Negative and Slump test: Negative  GAIT: Distance walked: 80 ft Assistive device utilized: None Comments: Improving gait with continued forward flexed trunk and hips   PATIENT EDUCATION:  Education details: aquatic HEP Person educated: Patient Education method: Consulting civil engineer, Media planner, Corporate treasurer cues, Verbal cues - issued laminated HEP Education comprehension: verbalized understanding, returned demonstration, verbal cues required, tactile cues required, and needs further education   HOME EXERCISE PROGRAM:  Access Code: [email or web address] URL: [email or web address] Date: 05/27/2022 Prepared by: Gillermo Murdoch  Exercises - Prone Press Up On Elbows  - 2 x daily - 7 x weekly - 1 sets - 1 reps - 2-4 min  hold - Prone Gluteal Sets  - 2 x daily - 7 x weekly - 1 sets - 10 reps - 10 sec  hold - Prone Quadriceps Stretch with Strap  - 2 x daily - 7 x weekly - 1 sets - 3 reps - 30 sec  hold - Seated Hip Flexor Stretch  - 2 x daily - 7 x weekly - 1 sets - 3 reps - 30 sec  hold - Supine Bridge  - 1 x daily - 7 x weekly - 1-2 sets - 10 reps - 5-10 sec  hold - Supine Transversus Abdominis Bracing with  Pelvic Floor Contraction  - 2 x daily - 7 x weekly - 1 sets - 10 reps - 10sec  hold - Supine Diaphragmatic Breathing  - 2 x daily - 7 x weekly - 1 sets - 10 reps - 4-6 sec  hold - Standing Hip Extension with Counter Support  - 2 x daily - 7 x weekly - 1-2 sets - 10 reps - 3-5 sec  hold - Standing Piriformis Release with Ball at [employer]  - 2 x daily - 7 x weekly - 30-60 sec  hold - Hooklying Isometric Cl[employer]hell  - 2 x daily - 7 x weekly - 1 sets - 10 reps - 3 sec  hold - Wall Quarter Squat  - 2 x daily - 7 x weekly - 1-2 sets - 10 reps - 5-10 sec  hold - Anti-Rotation Lateral Stepping with Press  - 2 x daily - 7 x weekly - 1-2 sets - 10 reps - 2-3 sec  hold - Drawing Bow  - 1 x daily - 7 x weekly - 1 sets -  10 reps - 3 sec  hold - Modified Deadlift with Pelvic Contraction  - 1 x daily - 7 x weekly - 1 sets - 10 reps  Aquatic HEP - Side Stepping  - 1 x daily - 3 x weekly - 3 sets - 10 reps - Backwards Walking  - 1 x daily - 3 x weekly - 3 sets - Walking Tandem Stance  - 1 x daily - 3 x weekly - 3 sets - Standing 3-Way Leg Reach  - 1 x daily - 3 x weekly - 1 sets - 10 reps - Split Stance Shoulder Row with Resistance  - 1 x daily - 3 x weekly - 2 sets - 10 reps - Single Leg Stance Clamshell  - 1 x daily - 3 x weekly - 1 sets - 10 reps - Warrior I in [employer] with [employer]  - 1 x daily - 3 x weekly - 1 sets - 5-10 reps - Sit to Stand  - 1 x daily - 7 x weekly - 1 sets - 10 reps - 3-5 sec  hold - Plank with Hip Extension at [employer]  - 1 x daily - 3 x weekly - 1 sets - 10 reps - TL Sidebending Stretch - Single Arm Overhead  - 1 x daily - 7 x weekly - 2 sets - 10-30 sec hold   ASSESSMENT:  CLINICAL IMPRESSION: Pt requiring less cues for more upright posture.  He appeared frustrated with decreased balance with single leg exercises (3 way toe taps) as well as tandem gait.  He completed all exercises without any pain.   [name] will benefit from continued PT to address problems and work toward  maximum rehab potential. Progressing towards therapy goals.   OBJECTIVE IMPAIRMENTS Abnormal gait, decreased activity tolerance, decreased balance, decreased endurance, decreased mobility, decreased ROM, decreased strength, hypomobility, increased fascial restrictions, impaired flexibility, improper body mechanics, postural dysfunction, obesity, and pain.   ACTIVITY LIMITATIONS carrying, lifting, bending, sitting, standing, squatting, transfers, and bed mobility  PARTICIPATION LIMITATIONS: community activity and yard work  [place], Past/current experiences, and 1-2 comorbidities: HTN; Rt THA; prediabetes  are also affecting patient's functional outcome.   REHAB POTENTIAL: Good  CLINICAL DECISION MAKING: Stable/uncomplicated  EVALUATION COMPLEXITY: Low   GOALS: Goals reviewed with patient? Yes   LONG TERM GOALS: Target date: 07/22/2022  Improve posture and alignment with patient to demonstrate neutral spinal and hip alignment in standing Baseline:  Goal status: ongoing  2.  Decrease back pain by 50-75% allowing patient to participate in normal functional activities with less pain and limitation  Baseline:  Goal status: on going  3.  Increase spinal extension to 10-20% range in standing  Baseline:  Goal status: on going  4.  Independent in HEP (including aquatic program) Baseline:  Goal status: on going  5.  Improve functional limitation score to 54 Baseline:  Goal status: on going     PLAN: PT FREQUENCY: 2x/wk  PT DURATION: 6 weeks  PLANNED INTERVENTIONS: Therapeutic exercises, Therapeutic activity, Neuromuscular re-education, Balance training, Gait training, Patient/Family education, Self Care, Joint mobilization, Aquatic Therapy, Dry Needling, Electrical stimulation, Spinal mobilization, Cryotherapy, Moist heat, Taping, Manual therapy, and Re-evaluation.  PLAN FOR NEXT SESSION: review and progress with HEP; continue of DN to Lt QL, lats, lumbar  spine; progress with dynamic stabilization; continue education and home instruction; modalities as indicated.    Kerin Perna, PTA 06/04/22 11:16 AM [facility] [phone]  [place], [place], [address] Phone: [phone]   Fax:  [phone]

## 2022-06-05 ENCOUNTER — Ambulatory Visit: Payer: Medicare PPO | Admitting: Rehabilitative and Restorative Service Providers"

## 2022-06-05 ENCOUNTER — Encounter: Payer: Self-pay | Admitting: Rehabilitative and Restorative Service Providers"

## 2022-06-05 DIAGNOSIS — R29898 Other symptoms and signs involving the musculoskeletal system: Secondary | ICD-10-CM

## 2022-06-05 DIAGNOSIS — M5459 Other low back pain: Secondary | ICD-10-CM | POA: Diagnosis not present

## 2022-06-05 NOTE — Therapy (Signed)
OUTPATIENT PHYSICAL THERAPY TREATMENT   Patient Name: John Morrow MRN: [id number] DOB:08/24/1951, 71 y.o., male Today's Date: 06/05/2022   PT End of Session - 06/05/22 0845     Visit Number 14    Number of Visits 24    Date for PT Re-Evaluation 07/22/22    Authorization Time Period 05/27/22-07/22/22    Authorization - Visit Number 3    Authorization - Number of Visits 12    Progress Note Due on Visit 20    PT Start Time 0845    PT Stop Time 0930    PT Time Calculation (min) 45 min    Activity Tolerance Patient tolerated treatment well              Past Medical History:  Diagnosis Date   Arthritis    Back pain    Diverticulosis of colon without hemorrhage 06/12/2016   On Colonoscopy 11/06/2010   HLD (hyperlipidemia) 05/29/2016   Hyperlipemia    Hypertension    Joint pain    Morbid obesity ([place]) 05/29/2016   Prediabetes    Right hip pain    needs hip replacement   Sleep apnea    on CPAP   Past Surgical History:  Procedure Laterality Date   EYE SURGERY     age 39   KNEE SURGERY     REPLACEMENT TOTAL HIP W/  RESURFACING IMPLANTS Right 41/9379   Dr. Aleda Grana   Patient Active Problem List   Diagnosis Date Noted   Acute pancreatitis 04/07/2022   OSA (obstructive sleep apnea) 02/19/2022   Gout 12/24/2021   Chronic rhinitis 08/21/2021   Impotence of organic origin 02/13/2021   Lumbar spondylosis 10/09/2020   Pre-operative exam 07/18/2020   Verruca 05/01/2020   Epiploic appendagitis 02/20/2020   Bilateral shoulder region arthritis 04/13/2019   Vitamin D deficiency 02/08/2019   Insulin resistance 02/08/2019   Class 2 severe obesity with serious comorbidity and body mass index (BMI) of 39.0 to 39.9 in adult ([place]) 02/08/2019   Prediabetes 02/08/2019   Chronic pain 09/28/2018   Hip pain 06/28/2018   Dependent edema 05/05/2017   Primary osteoarthritis of left knee 09/25/2016   BPH (benign prostatic hyperplasia) 05/29/2016   Urine frequency 05/29/2016    HTN (hypertension) 05/29/2016   HLD (hyperlipidemia) 05/29/2016   Class 2 obesity due to excess calories without serious comorbidity with body mass index (BMI) of 39.0 to 39.9 in adult 05/29/2016   Sleep apnea 03/31/2014    PCP: Dr Luetta Nutting  REFERRING PROVIDER: Dr Luetta Nutting  REFERRING DIAG: Acute LBP  Rationale for Evaluation and Treatment Rehabilitation  THERAPY DIAG:  Other low back pain  Other symptoms and signs involving the musculoskeletal system  ONSET DATE: 04/07/22; history of LBP for ~ 5 years   SUBJECTIVE:  SUBJECTIVE STATEMENT: 06/05/22: Continues to improve. Likes working in the pool. Hardest is when he works on balance. Still has some discomfort in the mornings and sometimes at other times, depending on activities. Can feel release in tissue tightness with deep tissue work and can feel similar release with ball lying on side.   PERTINENT HISTORY:  HTN; hyperlipemia; Rt THA; obesity; back pain; prediabetes; sleep apnea;   PAIN:  06/05/22: Are you having pain? no: NPRS scale: 3/10  Pain location:  Lt low back Pain description: tight  Aggravating factors: first thing in morning; prolonged standing; walking; steps; lifting Relieving factors: deep tissue release; sitting; not moving  PRECAUTIONS: None  PATIENT GOALS   to be pain free, or comfortable    OBJECTIVE:   TODAY'S TREATMENT:  06/05/22 There Ex: Nu-step L6 x 10 minutes LE/UEs (UE 10) Standing: Shoulder extension black TB x 10 reps x 2 sets Shoulder rowing black TB x 10 reps x 2 sets Bow and arrow black TB x 10 reps x 2 sets Antirotation black TB x 10 reps x 2 sets each side  Backwards walking 20 feet x 4 reps Standing back at wall for glut set/hip extension ~ 2 min   05/27/22: Seated: Sit to stand slow  eccentric stand to sit x 5 Hip flexor stretch seated x 30 seconds x 3 reps  Supine:               Hooklying bilat hip flexion stretch Thomas 30 sec x 2 ea   Trunk rotation supine x 10 reps Manual: STM Lt QL  pt sidelying  - deep tissue release work  Self release using various balls to reproduce deep tissue    release in side lying     Aquatic Therapy  05/22/22: Pt seen for aquatic therapy today.  Treatment took place in water 3.25-4.75 ft in depth at the [place]. Temp of water was 92.  Pt entered/exited the pool via stairs independently with bilat rail. * Forward / backward walking;  side stepping  * Holding yellow hand buoys- side stepping with arm / leg abdct/ add R/L; tandem gait forward / backward; forward/backward walking with yellow hand buoys at sides under water for core engagement. * holding wall:  single leg clam x 10 (unable to complete with noodle)   * Holding yellow noodle:  SLS with 3 way toe taps x 10 each LE; hip flexor stretch x 20s x 2 - cues for posture and form * staggered stance kick board row - playing with stance distance and speed * STS from 4th step without UE support and 5 count descent * Plank with hip ext (hands on bench in water) * Warrior 1, lifting noodle off of water towards ceiling, in staggered stance x 5 each side (improved)   From initial evaluation: PATIENT SURVEYS:  FOTO 44  MUSCLE LENGTH: Hamstrings: tight Rt ~ 60 deg; Lt 65 deg Hip flexors tight Rt > Lt unable to tolerate [employer] test  POSTURE: rounded shoulders, forward head, decreased lumbar lordosis, increased thoracic kyphosis, posterior pelvic tilt, and flexed trunk   PALPATION: Muscular tightness Lt QL, lats, lumbar paraspinals, iliopsoas  LUMBAR ROM:   Active  A/PROM  eval AROM 05/27/22  Flexion 45% 70%  Extension -10% 5%  Right lateral flexion 35% 65%  Left lateral flexion 30% 55%  Right rotation 15% 30%  Left rotation 10% 25%   (Blank rows = not  tested)  LOWER EXTREMITY ROM:  Active  Right eval Right 05/27/22 Left eval Left 05/27/22  Hip flexion tight Tight end range tight Tight end range  Hip extension -10 0 -10 0  Hip abduction tight tight tight Tight   Hip adduction      Hip internal rotation      Hip external rotation      Knee flexion 90  90   Knee extension WNL's  WNL's   Ankle dorsiflexion      Ankle plantarflexion      Ankle inversion      Ankle eversion       (Blank rows = not tested)  LOWER EXTREMITY MMT:    MMT Right eval  Left eval   Hip flexion 4/5 4+/5 4/5 4+/5  Hip extension 4-/5 4/5 4/4 4/5  Hip abduction 4-/5 4+/5 4-/5 4/5  Hip adduction      Hip internal rotation      Hip external rotation      Knee flexion 5/5 5/5 5/5 5/5  Knee extension 5/5 5/5 5/5 5/5  Ankle dorsiflexion      Ankle plantarflexion      Ankle inversion      Ankle eversion       (Blank rows = not tested)  LUMBAR SPECIAL TESTS:  Straight leg raise test: Negative and Slump test: Negative  GAIT: Distance walked: 80 ft Assistive device utilized: None Comments: Improving gait with continued forward flexed trunk and hips   PATIENT EDUCATION:  Education details: aquatic HEP Person educated: Patient Education method: Consulting civil engineer, Media planner, Corporate treasurer cues, Verbal cues - issued laminated HEP Education comprehension: verbalized understanding, returned demonstration, verbal cues required, tactile cues required, and needs further education   HOME EXERCISE PROGRAM:  Access Code: [email or web address] URL: [email or web address] Date: 05/27/2022 Prepared by: Gillermo Murdoch  Exercises - Prone Press Up On Elbows  - 2 x daily - 7 x weekly - 1 sets - 1 reps - 2-4 min  hold - Prone Gluteal Sets  - 2 x daily - 7 x weekly - 1 sets - 10 reps - 10 sec  hold - Prone Quadriceps Stretch with Strap  - 2 x daily - 7 x weekly - 1 sets - 3 reps - 30 sec  hold - Seated Hip Flexor Stretch  - 2 x daily - 7 x weekly - 1 sets - 3 reps - 30  sec  hold - Supine Bridge  - 1 x daily - 7 x weekly - 1-2 sets - 10 reps - 5-10 sec  hold - Supine Transversus Abdominis Bracing with Pelvic Floor Contraction  - 2 x daily - 7 x weekly - 1 sets - 10 reps - 10sec  hold - Supine Diaphragmatic Breathing  - 2 x daily - 7 x weekly - 1 sets - 10 reps - 4-6 sec  hold - Standing Hip Extension with Counter Support  - 2 x daily - 7 x weekly - 1-2 sets - 10 reps - 3-5 sec  hold - Standing Piriformis Release with Ball at [employer]  - 2 x daily - 7 x weekly - 30-60 sec  hold - Hooklying Isometric Clamshell  - 2 x daily - 7 x weekly - 1 sets - 10 reps - 3 sec  hold - Wall Quarter Squat  - 2 x daily - 7 x weekly - 1-2 sets - 10 reps - 5-10 sec  hold - Anti-Rotation Lateral Stepping with Press  - 2 x daily - 7 x weekly - 1-2 sets -  10 reps - 2-3 sec  hold - TL Sidebending Stretch - Single Arm Overhead  - 1 x daily - 7 x weekly - 2 sets - 10-30 sec hold - Side Stepping  - 1 x daily - 3 x weekly - 3 sets - 10 reps - Backwards Walking  - 1 x daily - 3 x weekly - 3 sets - Walking Tandem Stance  - 1 x daily - 3 x weekly - 3 sets - Standing 3-Way Leg Reach  - 1 x daily - 3 x weekly - 1 sets - 10 reps - Split Stance Shoulder Row with Resistance  - 1 x daily - 3 x weekly - 2 sets - 10 reps - Single Leg Stance Clamshell  - 1 x daily - 3 x weekly - 1 sets - 10 reps - Warrior I in [employer] with [employer]  - 1 x daily - 3 x weekly - 1 sets - 5-10 reps - Sit to Stand  - 1 x daily - 7 x weekly - 1 sets - 10 reps - 3-5 sec  hold - Plank with Hip Extension at [employer]  - 1 x daily - 3 x weekly - 1 sets - 10 reps - Drawing Bow  - 1 x daily - 7 x weekly - 1 sets - 10 reps - 3 sec  hold - Modified Deadlift with Pelvic Contraction  - 1 x daily - 7 x weekly - 1 sets - 10 reps Aquatic HEP is listed under same code in [place]:  CLINICAL IMPRESSION: Pt continued improvement in symptoms with less pain and discomfort. He is progressing well with strengthening /  balance exercises on land and in aquatics program without difficulty. He continues to demonstrate forward flexed posture with hip flexion.  [name] will benefit from continued PT to address problems and work toward maximum rehab potential. Progressing towards therapy goals.   OBJECTIVE IMPAIRMENTS Abnormal gait, decreased activity tolerance, decreased balance, decreased endurance, decreased mobility, decreased ROM, decreased strength, hypomobility, increased fascial restrictions, impaired flexibility, improper body mechanics, postural dysfunction, obesity, and pain.   ACTIVITY LIMITATIONS carrying, lifting, bending, sitting, standing, squatting, transfers, and bed mobility  PARTICIPATION LIMITATIONS: community activity and yard work  [place], Past/current experiences, and 1-2 comorbidities: HTN; Rt THA; prediabetes  are also affecting patient's functional outcome.   REHAB POTENTIAL: Good  CLINICAL DECISION MAKING: Stable/uncomplicated  EVALUATION COMPLEXITY: Low   GOALS: Goals reviewed with patient? Yes   LONG TERM GOALS: Target date: 07/22/2022  Improve posture and alignment with patient to demonstrate neutral spinal and hip alignment in standing Baseline:  Goal status: ongoing  2.  Decrease back pain by 50-75% allowing patient to participate in normal functional activities with less pain and limitation  Baseline:  Goal status: on going  3.  Increase spinal extension to 10-20% range in standing  Baseline:  Goal status: on going  4.  Independent in HEP (including aquatic program) Baseline:  Goal status: on going  5.  Improve functional limitation score to 54 Baseline:  Goal status: on going     PLAN: PT FREQUENCY: 2x/wk  PT DURATION: 6 weeks  PLANNED INTERVENTIONS: Therapeutic exercises, Therapeutic activity, Neuromuscular re-education, Balance training, Gait training, Patient/Family education, Self Care, Joint mobilization, Aquatic Therapy, Dry Needling,  Electrical stimulation, Spinal mobilization, Cryotherapy, Moist heat, Taping, Manual therapy, and Re-evaluation.  PLAN FOR NEXT SESSION: review and progress with HEP; continue of DN to Lt QL, lats, lumbar spine; progress  with dynamic stabilization; continue education and home instruction; modalities as indicated.   Aquatics: trial ai chi gathering and leg press with yellow noodle  Kaesha Kirsch Nilda Simmer, PT, MPH  06/05/22 9:33 AM

## 2022-06-10 ENCOUNTER — Encounter: Payer: Self-pay | Admitting: Rehabilitative and Restorative Service Providers"

## 2022-06-10 ENCOUNTER — Ambulatory Visit: Payer: Medicare PPO | Admitting: Rehabilitative and Restorative Service Providers"

## 2022-06-10 ENCOUNTER — Encounter: Payer: Self-pay | Admitting: Gastroenterology

## 2022-06-10 DIAGNOSIS — M5459 Other low back pain: Secondary | ICD-10-CM | POA: Diagnosis not present

## 2022-06-10 DIAGNOSIS — R29898 Other symptoms and signs involving the musculoskeletal system: Secondary | ICD-10-CM

## 2022-06-10 NOTE — Therapy (Signed)
OUTPATIENT PHYSICAL THERAPY TREATMENT   Patient Name: John Morrow MRN: [id number] DOB:09/04/1951, 71 y.o., male Today's Date: 06/10/2022   PT End of Session - 06/10/22 0901     Visit Number 15    Number of Visits 24    Date for PT Re-Evaluation 07/22/22    Authorization Time Period 05/27/22-07/22/22    Authorization - Visit Number 4    Authorization - Number of Visits 12    Progress Note Due on Visit 20    PT Start Time 0800    PT Stop Time 4585    PT Time Calculation (min) 47 min    Activity Tolerance Patient tolerated treatment well               Past Medical History:  Diagnosis Date   Arthritis    Back pain    Diverticulosis of colon without hemorrhage 06/12/2016   On Colonoscopy 11/06/2010   HLD (hyperlipidemia) 05/29/2016   Hyperlipemia    Hypertension    Joint pain    Morbid obesity ([place]) 05/29/2016   Prediabetes    Right hip pain    needs hip replacement   Sleep apnea    on CPAP   Past Surgical History:  Procedure Laterality Date   EYE SURGERY     age 17   KNEE SURGERY     REPLACEMENT TOTAL HIP W/  RESURFACING IMPLANTS Right 92/9244   Dr. Aleda Grana   Patient Active Problem List   Diagnosis Date Noted   Acute pancreatitis 04/07/2022   OSA (obstructive sleep apnea) 02/19/2022   Gout 12/24/2021   Chronic rhinitis 08/21/2021   Impotence of organic origin 02/13/2021   Lumbar spondylosis 10/09/2020   Pre-operative exam 07/18/2020   Verruca 05/01/2020   Epiploic appendagitis 02/20/2020   Bilateral shoulder region arthritis 04/13/2019   Vitamin D deficiency 02/08/2019   Insulin resistance 02/08/2019   Class 2 severe obesity with serious comorbidity and body mass index (BMI) of 39.0 to 39.9 in adult ([place]) 02/08/2019   Prediabetes 02/08/2019   Chronic pain 09/28/2018   Hip pain 06/28/2018   Dependent edema 05/05/2017   Primary osteoarthritis of left knee 09/25/2016   BPH (benign prostatic hyperplasia) 05/29/2016   Urine frequency 05/29/2016    HTN (hypertension) 05/29/2016   HLD (hyperlipidemia) 05/29/2016   Class 2 obesity due to excess calories without serious comorbidity with body mass index (BMI) of 39.0 to 39.9 in adult 05/29/2016   Sleep apnea 03/31/2014    PCP: Dr Luetta Nutting  REFERRING PROVIDER: Dr Luetta Nutting  REFERRING DIAG: Acute LBP  Rationale for Evaluation and Treatment Rehabilitation  THERAPY DIAG:  Other low back pain  Other symptoms and signs involving the musculoskeletal system  ONSET DATE: 04/07/22; history of LBP for ~ 5 years   SUBJECTIVE:  SUBJECTIVE STATEMENT: 06/10/22: Doing okay. Still has some discomfort in the mornings and occasionally at other times, depending on activities. Can feel release in tissue tightness with deep tissue work and can feel similar release with ball lying on side but has not found a ball for home.  PERTINENT HISTORY:  HTN; hyperlipemia; Rt THA; obesity; back pain; prediabetes; sleep apnea;   PAIN:  06/10/22: Are you having pain? no: NPRS scale:2/10  Pain location:  Lt low back Pain description: tight  Aggravating factors: first thing in morning; prolonged standing; walking; steps; lifting Relieving factors: deep tissue release; sitting; not moving  PRECAUTIONS: None  PATIENT GOALS   to be pain free, or comfortable    OBJECTIVE:   TODAY'S TREATMENT:  06/10/22 There Ex: Nu-step L7 x 10 minutes LE/UEs (UE 10) Standing: Shoulder flexion facing wall x 10 reps x 2 sets Modified deadlift 15% KB from 8" stool x 15 Deadlift with black TB x 15 Hip extension facing wall then counter x 10 x 2 sets Bow and arrow black TB x 10 reps x 2 sets Standing back at wall for glut set/hip extension ~ 2 min  LE swing standing on step each side 20-30 x Working on standing posture and alignment  w/hip ext  Manual: STM Lt QL  pt sidelying  - deep tissue release work  Self release using various balls to reproduce deep tissue    release in side lying Lt side stretch sitting with pillows under Rt trunk leaning over pillows for stretch through the Lt QL 1 min x 3 reps      Aquatic Therapy  05/22/22: Pt seen for aquatic therapy today.  Treatment took place in water 3.25-4.75 ft in depth at the [facility]. Temp of water was 92.  Pt entered/exited the pool via stairs independently with bilat rail. * Forward / backward walking;  side stepping  * Holding yellow hand buoys- side stepping with arm / leg abdct/ add R/L; tandem gait forward / backward; forward/backward walking with yellow hand buoys at sides under water for core engagement. * holding wall:  single leg clam x 10 (unable to complete with noodle)   * Holding yellow noodle:  SLS with 3 way toe taps x 10 each LE; hip flexor stretch x 20s x 2 - cues for posture and form * staggered stance kick board row - playing with stance distance and speed * STS from 4th step without UE support and 5 count descent * Plank with hip ext (hands on bench in water) * Warrior 1, lifting noodle off of water towards ceiling, in staggered stance x 5 each side (improved)   From initial evaluation: PATIENT SURVEYS:  FOTO 44  MUSCLE LENGTH: Hamstrings: tight Rt ~ 60 deg; Lt 65 deg Hip flexors tight Rt > Lt unable to tolerate [employer] test  POSTURE: rounded shoulders, forward head, decreased lumbar lordosis, increased thoracic kyphosis, posterior pelvic tilt, and flexed trunk   PALPATION: Muscular tightness Lt QL, lats, lumbar paraspinals, iliopsoas  LUMBAR ROM:   Active  A/PROM  eval AROM 05/27/22  Flexion 45% 70%  Extension -10% 5%  Right lateral flexion 35% 65%  Left lateral flexion 30% 55%  Right rotation 15% 30%  Left rotation 10% 25%   (Blank rows = not tested)  LOWER EXTREMITY ROM:     Active  Right eval  Right 05/27/22 Left eval Left 05/27/22  Hip flexion tight Tight end range tight Tight end range  Hip extension -10 0 -  10 0  Hip abduction tight tight tight Tight   Hip adduction      Hip internal rotation      Hip external rotation      Knee flexion 90  90   Knee extension WNL's  WNL's   Ankle dorsiflexion      Ankle plantarflexion      Ankle inversion      Ankle eversion       (Blank rows = not tested)  LOWER EXTREMITY MMT:    MMT Right eval  Left eval   Hip flexion 4/5 4+/5 4/5 4+/5  Hip extension 4-/5 4/5 4/4 4/5  Hip abduction 4-/5 4+/5 4-/5 4/5  Hip adduction      Hip internal rotation      Hip external rotation      Knee flexion 5/5 5/5 5/5 5/5  Knee extension 5/5 5/5 5/5 5/5  Ankle dorsiflexion      Ankle plantarflexion      Ankle inversion      Ankle eversion       (Blank rows = not tested)  LUMBAR SPECIAL TESTS:  Straight leg raise test: Negative and Slump test: Negative  GAIT: Distance walked: 80 ft Assistive device utilized: None Comments: Improving gait with continued forward flexed trunk and hips   PATIENT EDUCATION:  Education details: aquatic HEP Person educated: Patient Education method: Consulting civil engineer, Media planner, Corporate treasurer cues, Verbal cues - issued laminated HEP Education comprehension: verbalized understanding, returned demonstration, verbal cues required, tactile cues required, and needs further education   HOME EXERCISE PROGRAM: Access Code: [email or web address] URL: [email or web address] Date: 06/10/2022 Prepared by: Gillermo Murdoch  Exercises - Prone Press Up On Elbows  - 2 x daily - 7 x weekly - 1 sets - 1 reps - 2-4 min  hold - Prone Gluteal Sets  - 2 x daily - 7 x weekly - 1 sets - 10 reps - 10 sec  hold - Prone Quadriceps Stretch with Strap  - 2 x daily - 7 x weekly - 1 sets - 3 reps - 30 sec  hold - Seated Hip Flexor Stretch  - 2 x daily - 7 x weekly - 1 sets - 3 reps - 30 sec  hold - Supine Bridge  - 1 x daily - 7 x weekly - 1-2  sets - 10 reps - 5-10 sec  hold - Supine Transversus Abdominis Bracing with Pelvic Floor Contraction  - 2 x daily - 7 x weekly - 1 sets - 10 reps - 10sec  hold - Supine Diaphragmatic Breathing  - 2 x daily - 7 x weekly - 1 sets - 10 reps - 4-6 sec  hold - Standing Hip Extension with Counter Support  - 2 x daily - 7 x weekly - 1-2 sets - 10 reps - 3-5 sec  hold - Standing Piriformis Release with Ball at [employer]  - 2 x daily - 7 x weekly - 30-60 sec  hold - Hooklying Isometric Clamshell  - 2 x daily - 7 x weekly - 1 sets - 10 reps - 3 sec  hold - Wall Quarter Squat  - 2 x daily - 7 x weekly - 1-2 sets - 10 reps - 5-10 sec  hold - Anti-Rotation Lateral Stepping with Press  - 2 x daily - 7 x weekly - 1-2 sets - 10 reps - 2-3 sec  hold - TL Sidebending Stretch - Single Arm Overhead  - 1 x daily - 7 x weekly - 2  sets - 10-30 sec hold - Side Stepping  - 1 x daily - 3 x weekly - 3 sets - 10 reps - Backwards Walking  - 1 x daily - 3 x weekly - 3 sets - Walking Tandem Stance  - 1 x daily - 3 x weekly - 3 sets - Standing 3-Way Leg Reach  - 1 x daily - 3 x weekly - 1 sets - 10 reps - Split Stance Shoulder Row with Resistance  - 1 x daily - 3 x weekly - 2 sets - 10 reps - Single Leg Stance Clamshell  - 1 x daily - 3 x weekly - 1 sets - 10 reps - Warrior I in [employer] with [employer]  - 1 x daily - 3 x weekly - 1 sets - 5-10 reps - Sit to Stand  - 1 x daily - 7 x weekly - 1 sets - 10 reps - 3-5 sec  hold - Plank with Hip Extension at [employer]  - 1 x daily - 3 x weekly - 1 sets - 10 reps - Drawing Bow  - 1 x daily - 7 x weekly - 1 sets - 10 reps - 3 sec  hold - Modified Deadlift with Pelvic Contraction  - 1 x daily - 7 x weekly - 1 sets - 10 reps - Deadlift with Resistance  - 1 x daily - 3-4 x weekly - 1 sets - 10 reps - Standing shoulder flexion wall slides  - 1 x daily - 7 x weekly - 1 sets - 5-10 reps - 10 sec  hold - Standing Hip Extension at Wall  - 1 x daily - 7 x weekly - 1 sets - 10 reps - 3 sec   hold  ASSESSMENT:  CLINICAL IMPRESSION: [name] continues to report improvement with decreased symptoms including less pain and discomfort. He is progressing well with strengthening/balance exercises on land and in aquatics program. He has continued forward flexed posture with hips flexed.  He can achieve improved hip extension but requires continued cues to maintain more upright posture. Progressing well towards therapy goals.   OBJECTIVE IMPAIRMENTS Abnormal gait, decreased activity tolerance, decreased balance, decreased endurance, decreased mobility, decreased ROM, decreased strength, hypomobility, increased fascial restrictions, impaired flexibility, improper body mechanics, postural dysfunction, obesity, and pain.   ACTIVITY LIMITATIONS carrying, lifting, bending, sitting, standing, squatting, transfers, and bed mobility  PARTICIPATION LIMITATIONS: community activity and yard work  [place], Past/current experiences, and 1-2 comorbidities: HTN; Rt THA; prediabetes  are also affecting patient's functional outcome.   REHAB POTENTIAL: Good  CLINICAL DECISION MAKING: Stable/uncomplicated  EVALUATION COMPLEXITY: Low   GOALS: Goals reviewed with patient? Yes   LONG TERM GOALS: Target date: 07/22/2022  Improve posture and alignment with patient to demonstrate neutral spinal and hip alignment in standing Baseline:  Goal status: ongoing  2.  Decrease back pain by 50-75% allowing patient to participate in normal functional activities with less pain and limitation  Baseline:  Goal status: on going  3.  Increase spinal extension to 10-20% range in standing  Baseline:  Goal status: on going  4.  Independent in HEP (including aquatic program) Baseline:  Goal status: on going  5.  Improve functional limitation score to 54 Baseline:  Goal status: on going     PLAN: PT FREQUENCY: 2x/wk  PT DURATION: 6 weeks  PLANNED INTERVENTIONS: Therapeutic exercises,  Therapeutic activity, Neuromuscular re-education, Balance training, Gait training, Patient/Family education, Self Care, Joint mobilization, Aquatic  Therapy, Dry Needling, Electrical stimulation, Spinal mobilization, Cryotherapy, Moist heat, Taping, Manual therapy, and Re-evaluation.  PLAN FOR NEXT SESSION: review and progress with HEP; continue of DN to Lt QL, lats, lumbar spine; progress with dynamic stabilization; continue education and home instruction; modalities as indicated.   Aquatics: trial ai chi gathering and leg press with yellow noodle  Ramina Hulet Nilda Simmer, PT, MPH  06/10/22 9:03 AM

## 2022-06-11 ENCOUNTER — Ambulatory Visit (HOSPITAL_BASED_OUTPATIENT_CLINIC_OR_DEPARTMENT_OTHER): Payer: Medicare PPO | Admitting: Physical Therapy

## 2022-06-11 ENCOUNTER — Encounter (HOSPITAL_BASED_OUTPATIENT_CLINIC_OR_DEPARTMENT_OTHER): Payer: Self-pay | Admitting: Physical Therapy

## 2022-06-11 DIAGNOSIS — R29898 Other symptoms and signs involving the musculoskeletal system: Secondary | ICD-10-CM

## 2022-06-11 DIAGNOSIS — M5459 Other low back pain: Secondary | ICD-10-CM

## 2022-06-11 NOTE — Therapy (Signed)
OUTPATIENT PHYSICAL THERAPY TREATMENT   Patient Name: John Morrow MRN: [id number] DOB:11-12-1950, 71 y.o., male Today's Date: 06/11/2022   PT End of Session - 06/11/22 0909     Visit Number 16    Number of Visits 24    Date for PT Re-Evaluation 07/22/22    Authorization Time Period 05/27/22-07/22/22    Progress Note Due on Visit 20    PT Start Time 0901    PT Stop Time 0941    PT Time Calculation (min) 40 min    Activity Tolerance Patient tolerated treatment well    Behavior During Therapy [facility] for tasks assessed/performed               Past Medical History:  Diagnosis Date   Arthritis    Back pain    Diverticulosis of colon without hemorrhage 06/12/2016   On Colonoscopy 11/06/2010   HLD (hyperlipidemia) 05/29/2016   Hyperlipemia    Hypertension    Joint pain    Morbid obesity ([place]) 05/29/2016   Prediabetes    Right hip pain    needs hip replacement   Sleep apnea    on CPAP   Past Surgical History:  Procedure Laterality Date   EYE SURGERY     age 49   KNEE SURGERY     REPLACEMENT TOTAL HIP W/  RESURFACING IMPLANTS Right 07/9416   Dr. Aleda Grana   Patient Active Problem List   Diagnosis Date Noted   Acute pancreatitis 04/07/2022   OSA (obstructive sleep apnea) 02/19/2022   Gout 12/24/2021   Chronic rhinitis 08/21/2021   Impotence of organic origin 02/13/2021   Lumbar spondylosis 10/09/2020   Pre-operative exam 07/18/2020   [place]ca 05/01/2020   Epiploic appendagitis 02/20/2020   Bilateral shoulder region arthritis 04/13/2019   Vitamin D deficiency 02/08/2019   Insulin resistance 02/08/2019   Class 2 severe obesity with serious comorbidity and body mass index (BMI) of 39.0 to 39.9 in adult ([place]) 02/08/2019   Prediabetes 02/08/2019   Chronic pain 09/28/2018   Hip pain 06/28/2018   Dependent edema 05/05/2017   Primary osteoarthritis of left knee 09/25/2016   BPH (benign prostatic hyperplasia) 05/29/2016   Urine frequency 05/29/2016   HTN  (hypertension) 05/29/2016   HLD (hyperlipidemia) 05/29/2016   Class 2 obesity due to excess calories without serious comorbidity with body mass index (BMI) of 39.0 to 39.9 in adult 05/29/2016   Sleep apnea 03/31/2014    PCP: Dr Luetta Nutting  REFERRING PROVIDER: Dr Luetta Nutting  REFERRING DIAG: Acute LBP  Rationale for Evaluation and Treatment Rehabilitation  THERAPY DIAG:  Other low back pain  Other symptoms and signs involving the musculoskeletal system  ONSET DATE: 04/07/22; history of LBP for ~ 5 years   SUBJECTIVE:  SUBJECTIVE STATEMENT: "She got me good yesterday with her elbow".  Pt states this gives him relief each time.  He reports he went to pool 1x since last aquatic visit.   PERTINENT HISTORY:  HTN; hyperlipemia; Rt THA; obesity; back pain; prediabetes; sleep apnea;   PAIN:   Are you having pain? yes: NPRS scale:2/10  Pain location:  Lt low back/ Lt hip Pain description: tight  Aggravating factors: first thing in morning; prolonged standing; walking; steps; lifting Relieving factors: deep tissue release; sitting; not moving  PRECAUTIONS: None  PATIENT GOALS   to be pain free, or comfortable    OBJECTIVE:   TODAY'S TREATMENT:  Aquatic Therapy  06/11/22: Pt seen for aquatic therapy today.  Treatment took place in water 3.25-4.75 ft in depth at the [facility]. Temp of water was 92.  Pt entered/exited the pool via stairs independently with bilat rail. * Forward / backward walking;  side stepping  * Holding rainbow hand buoys- tandem gait forward / backward *Holding yellow hand buoys:  side stepping with arm / leg abdct/ add R/L; side step squat with arm abdct/add (challenge for coordination) ; walking forward lunges/ split squats  * holding wall:  single leg  clam x 10; quick hip abdct/add x 10 each with core engaged; hip ext x 5 each * Plank with hip ext (hands on bench in water) *  hip flexor stretch x 20s x 2 - cues for posture and form * staggered stance kick board row - playing with stance distance and speed * STS from 4th step without UE support and 5 count descent  * Warrior 1, lifting noodle off of water towards ceiling, in staggered stance x 5 each side (improved) *forward/backward walking with yellow hand buoys at sides under water for core engagement. 06/10/22 There Ex: Nu-step L7 x 10 minutes LE/UEs (UE 10) Standing: Shoulder flexion facing wall x 10 reps x 2 sets Modified deadlift 15% KB from 8" stool x 15 Deadlift with black TB x 15 Hip extension facing wall then counter x 10 x 2 sets Bow and arrow black TB x 10 reps x 2 sets Standing back at wall for glut set/hip extension ~ 2 min  LE swing standing on step each side 20-30 x Working on standing posture and alignment w/hip ext  Manual: STM Lt QL  pt sidelying  - deep tissue release work  Self release using various balls to reproduce deep tissue    release in side lying Lt side stretch sitting with pillows under Rt trunk leaning over pillows for stretch through the Lt QL 1 min x 3 reps        From initial evaluation: PATIENT SURVEYS:  FOTO 44  MUSCLE LENGTH: Hamstrings: tight Rt ~ 60 deg; Lt 65 deg Hip flexors tight Rt > Lt unable to tolerate [employer] test  POSTURE: rounded shoulders, forward head, decreased lumbar lordosis, increased thoracic kyphosis, posterior pelvic tilt, and flexed trunk   PALPATION: Muscular tightness Lt QL, lats, lumbar paraspinals, iliopsoas  LUMBAR ROM:   Active  A/PROM  eval AROM 05/27/22  Flexion 45% 70%  Extension -10% 5%  Right lateral flexion 35% 65%  Left lateral flexion 30% 55%  Right rotation 15% 30%  Left rotation 10% 25%   (Blank rows = not tested)  LOWER EXTREMITY ROM:     Active  Right eval Right 05/27/22 Left eval  Left 05/27/22  Hip flexion tight Tight end range tight Tight end range  Hip extension -  10 0 -10 0  Hip abduction tight tight tight Tight   Hip adduction      Hip internal rotation      Hip external rotation      Knee flexion 90  90   Knee extension WNL's  WNL's   Ankle dorsiflexion      Ankle plantarflexion      Ankle inversion      Ankle eversion       (Blank rows = not tested)  LOWER EXTREMITY MMT:    MMT Right eval  Left eval   Hip flexion 4/5 4+/5 4/5 4+/5  Hip extension 4-/5 4/5 4/4 4/5  Hip abduction 4-/5 4+/5 4-/5 4/5  Hip adduction      Hip internal rotation      Hip external rotation      Knee flexion 5/5 5/5 5/5 5/5  Knee extension 5/5 5/5 5/5 5/5  Ankle dorsiflexion      Ankle plantarflexion      Ankle inversion      Ankle eversion       (Blank rows = not tested)  LUMBAR SPECIAL TESTS:  Straight leg raise test: Negative and Slump test: Negative  GAIT: Distance walked: 80 ft Assistive device utilized: None Comments: Improving gait with continued forward flexed trunk and hips   PATIENT EDUCATION:  Education details:  Person educated: Patient Education method:  Education comprehension:  HOME EXERCISE PROGRAM: Access Code: [email or web address] URL: [email or web address] Date: 06/10/2022 Prepared by: Gillermo Murdoch  Exercises - Prone Press Up On Elbows  - 2 x daily - 7 x weekly - 1 sets - 1 reps - 2-4 min  hold - Prone Gluteal Sets  - 2 x daily - 7 x weekly - 1 sets - 10 reps - 10 sec  hold - Prone Quadriceps Stretch with Strap  - 2 x daily - 7 x weekly - 1 sets - 3 reps - 30 sec  hold - Seated Hip Flexor Stretch  - 2 x daily - 7 x weekly - 1 sets - 3 reps - 30 sec  hold - Supine Bridge  - 1 x daily - 7 x weekly - 1-2 sets - 10 reps - 5-10 sec  hold - Supine Transversus Abdominis Bracing with Pelvic Floor Contraction  - 2 x daily - 7 x weekly - 1 sets - 10 reps - 10sec  hold - Supine Diaphragmatic Breathing  - 2 x daily - 7 x weekly - 1 sets - 10 reps  - 4-6 sec  hold - Standing Hip Extension with Counter Support  - 2 x daily - 7 x weekly - 1-2 sets - 10 reps - 3-5 sec  hold - Standing Piriformis Release with Ball at [employer]  - 2 x daily - 7 x weekly - 30-60 sec  hold - Hooklying Isometric Clamshell  - 2 x daily - 7 x weekly - 1 sets - 10 reps - 3 sec  hold - Wall Quarter Squat  - 2 x daily - 7 x weekly - 1-2 sets - 10 reps - 5-10 sec  hold - Anti-Rotation Lateral Stepping with Press  - 2 x daily - 7 x weekly - 1-2 sets - 10 reps - 2-3 sec  hold - Drawing Bow  - 1 x daily - 7 x weekly - 1 sets - 10 reps - 3 sec  hold - Modified Deadlift with Pelvic Contraction  - 1 x daily - 7 x weekly - 1 sets -  10 reps - Deadlift with Resistance  - 1 x daily - 3-4 x weekly - 1 sets - 10 reps - Standing shoulder flexion wall slides  - 1 x daily - 7 x weekly - 1 sets - 5-10 reps - 10 sec  hold - Standing Hip Extension at Wall  - 1 x daily - 7 x weekly - 1 sets - 10 reps - 3 sec  hold  Aquatic : - TL Sidebending Stretch - Single Arm Overhead  - 1 x daily - 7 x weekly - 2 sets - 10-30 sec hold - Side Stepping  - 1 x daily - 3 x weekly - 3 sets - 10 reps - Backwards Walking  - 1 x daily - 3 x weekly - 3 sets - Walking Tandem Stance  - 1 x daily - 3 x weekly - 3 sets - Standing 3-Way Leg Reach  - 1 x daily - 3 x weekly - 1 sets - 10 reps - Split Stance Shoulder Row with Resistance  - 1 x daily - 3 x weekly - 2 sets - 10 reps - Single Leg Stance Clamshell  - 1 x daily - 3 x weekly - 1 sets - 10 reps - Warrior I in [employer] with [employer]  - 1 x daily - 3 x weekly - 1 sets - 5-10 reps - Sit to Stand  - 1 x daily - 7 x weekly - 1 sets - 10 reps - 3-5 sec  hold - Plank with Hip Extension at [employer]  - 1 x daily - 3 x weekly - 1 sets - 10 reps    ASSESSMENT:  CLINICAL IMPRESSION: Pt had difficulty with coordination of side squat with resisted arm abduction. Balance is challenged with staggered stance with row.  L ant hip remains tighter than RLE. Able to  complete all exercises pain-free.  Encouraged pt to visit pool again prior to next aquatic visit and give feedback on current HEP (if progression is needed).  Progressing well towards therapy goals.   OBJECTIVE IMPAIRMENTS Abnormal gait, decreased activity tolerance, decreased balance, decreased endurance, decreased mobility, decreased ROM, decreased strength, hypomobility, increased fascial restrictions, impaired flexibility, improper body mechanics, postural dysfunction, obesity, and pain.   ACTIVITY LIMITATIONS carrying, lifting, bending, sitting, standing, squatting, transfers, and bed mobility  PARTICIPATION LIMITATIONS: community activity and yard work  [place], Past/current experiences, and 1-2 comorbidities: HTN; Rt THA; prediabetes  are also affecting patient's functional outcome.   REHAB POTENTIAL: Good  CLINICAL DECISION MAKING: Stable/uncomplicated  EVALUATION COMPLEXITY: Low   GOALS: Goals reviewed with patient? Yes   LONG TERM GOALS: Target date: 07/22/2022  Improve posture and alignment with patient to demonstrate neutral spinal and hip alignment in standing Baseline:  Goal status: ongoing  2.  Decrease back pain by 50-75% allowing patient to participate in normal functional activities with less pain and limitation  Baseline:  Goal status: on going  3.  Increase spinal extension to 10-20% range in standing  Baseline:  Goal status: on going  4.  Independent in HEP (including aquatic program) Baseline:  Goal status: on going  5.  Improve functional limitation score to 54 Baseline:  Goal status: on going     PLAN: PT FREQUENCY: 2x/wk  PT DURATION: 6 weeks  PLANNED INTERVENTIONS: Therapeutic exercises, Therapeutic activity, Neuromuscular re-education, Balance training, Gait training, Patient/Family education, Self Care, Joint mobilization, Aquatic Therapy, Dry Needling, Electrical stimulation, Spinal mobilization, Cryotherapy, Moist heat,  Taping, Manual  therapy, and Re-evaluation.  PLAN FOR NEXT SESSION: review and progress with HEP; continue of DN to Lt QL, lats, lumbar spine; progress with dynamic stabilization; continue education and home instruction; modalities as indicated.    Kerin Perna, PTA 06/11/22 9:38 AM [facility] [address] [place], [place], [address] Phone: [phone]   Fax:  [phone]

## 2022-06-17 ENCOUNTER — Encounter: Payer: Self-pay | Admitting: Gastroenterology

## 2022-06-17 ENCOUNTER — Ambulatory Visit (AMBULATORY_SURGERY_CENTER): Payer: Medicare PPO | Admitting: Gastroenterology

## 2022-06-17 VITALS — BP 137/80 | HR 54 | Temp 98.6°F | Resp 14 | Ht 70.0 in | Wt 290.0 lb

## 2022-06-17 DIAGNOSIS — K640 First degree hemorrhoids: Secondary | ICD-10-CM | POA: Diagnosis not present

## 2022-06-17 DIAGNOSIS — Z86010 Personal history of colonic polyps: Secondary | ICD-10-CM

## 2022-06-17 DIAGNOSIS — K5730 Diverticulosis of large intestine without perforation or abscess without bleeding: Secondary | ICD-10-CM | POA: Diagnosis not present

## 2022-06-17 DIAGNOSIS — Z09 Encounter for follow-up examination after completed treatment for conditions other than malignant neoplasm: Secondary | ICD-10-CM

## 2022-06-17 MED ORDER — SODIUM CHLORIDE 0.9 % IV SOLN: 500.0000 mL | Freq: Once | INTRAVENOUS | Status: DC

## 2022-06-17 NOTE — Op Note (Signed)
[place] Patient Name: John Morrow Procedure Date: 06/17/2022 3:18 PM MRN: [id number] Endoscopist: Gerrit Heck , MD Age: 71 Referring MD:  Date of Birth: 1951/02/15 Gender: Male Account #: [id number] Procedure:                Colonoscopy Indications:              Colon polyp surveillance and colon cancer                            screening. Personal history of non-advanced adenoma                           Last colonoscopy in 05/2021 with 4 mm TA in                            ascending colon, ascending/sigmoid diverticulosis,                            but due to fair prep, was recommended to repeat in                            1 year. He presents today for colonoscopy after                            completing an extended 2-day preparation. Medicines:                Monitored Anesthesia Care Procedure:                Pre-Anesthesia Assessment:                           - Prior to the procedure, a History and Physical                            was performed, and patient medications and                            allergies were reviewed. The patient's tolerance of                            previous anesthesia was also reviewed. The risks                            and benefits of the procedure and the sedation                            options and risks were discussed with the patient.                            All questions were answered, and informed consent                            was obtained. Prior Anticoagulants: The patient has  taken no previous anticoagulant or antiplatelet                            agents. ASA Grade Assessment: II - A patient with                            mild systemic disease. After reviewing the risks                            and benefits, the patient was deemed in                            satisfactory condition to undergo the procedure.                           After obtaining informed consent,  the colonoscope                            was passed under direct vision. Throughout the                            procedure, the patient's blood pressure, pulse, and                            oxygen saturations were monitored continuously. The                            CF [id number] #[id number] was introduced through the anus                            and advanced to the the cecum, identified by                            appendiceal orifice and ileocecal valve. The                            colonoscopy was performed without difficulty. The                            patient tolerated the procedure well. The quality                            of the bowel preparation was excellent. The                            ileocecal valve, appendiceal orifice, and rectum                            were photographed. Scope In: 3:34:26 PM Scope Out: 3:46:45 PM Scope Withdrawal Time: 0 hours 9 minutes 39 seconds  Total Procedure Duration: 0 hours 12 minutes 19 seconds  Findings:                 The perianal and digital rectal examinations were  normal.                           Multiple small and large-mouthed diverticula were                            found in the sigmoid colon, transverse colon and                            ascending colon.                           Non-bleeding internal hemorrhoids were found during                            retroflexion. The hemorrhoids were small.                           The exam was otherwise normal throughout the                            remainder of the colon. Complications:            No immediate complications. Estimated Blood Loss:     Estimated blood loss: none. Impression:               - Diverticulosis in the sigmoid colon, in the                            transverse colon and in the ascending colon.                            Otherwise, normal appearing colon throughout.                           - Non-bleeding  internal hemorrhoids.                           - No specimens collected. Recommendation:           - Patient has a contact number available for                            emergencies. The signs and symptoms of potential                            delayed complications were discussed with the                            patient. Return to normal activities tomorrow.                            Written discharge instructions were provided to the                            patient.                           -  Resume previous diet.                           - Continue present medications.                           - Repeat colonoscopy is not recommended due to                            current age (58 years or older) for screening                            purposes.                           - Return to GI office PRN. Gerrit Heck, MD 06/17/2022 3:52:58 PM

## 2022-06-17 NOTE — Progress Notes (Signed)
Sedate, gd SR, tolerated procedure well, VSS, report to RN 

## 2022-06-17 NOTE — Progress Notes (Signed)
GASTROENTEROLOGY PROCEDURE H&P NOTE   Primary Care Physician: Luetta Nutting, DO    Reason for Procedure:  Colon Cancer screening  Plan:    Colonoscopy  Patient is appropriate for endoscopic procedure(s) in the ambulatory ([place]) setting.  The nature of the procedure, as well as the risks, benefits, and alternatives were carefully and thoroughly reviewed with the patient. Ample time for discussion and questions allowed. The patient understood, was satisfied, and agreed to proceed.     HPI: John Morrow is a 71 y.o. male who presents for colonoscopy for routine Colon Cancer screening.  No active GI symptoms.    Colonoscopy in 05/2021 with 4 mm TA in ascending colon, ascending/sigmoid diverticulosis, but due to fair prep, was recommended to repeat in 1 year.   Past Medical History:  Diagnosis Date   Arthritis    Back pain    Diverticulosis of colon without hemorrhage 06/12/2016   On Colonoscopy 11/06/2010   HLD (hyperlipidemia) 05/29/2016   Hyperlipemia    Hypertension    Joint pain    Morbid obesity ([place]) 05/29/2016   Prediabetes    Right hip pain    needs hip replacement   Sleep apnea    on CPAP    Past Surgical History:  Procedure Laterality Date   EYE SURGERY     age 71   KNEE SURGERY     REPLACEMENT TOTAL HIP W/  RESURFACING IMPLANTS Right 35/4656   Dr. Aleda Grana    Prior to Admission medications   Medication Sig Start Date End Date Taking? Authorizing Provider  Cholecalciferol (VITAMIN D3) 10 MCG (400 UNIT) CAPS Take by mouth.   Yes [provider]  cyanocobalamin (VITAMIN B12) 1000 MCG tablet Take 1,000 mcg by mouth daily.   Yes [provider]  Ginkgo Biloba 40 MG TABS Take by mouth.   Yes [provider]  lisinopril-hydrochlorothiazide (ZESTORETIC) 20-25 MG tablet Take 1 tablet by mouth once daily 05/28/22  Yes Luetta Nutting, DO  tamsulosin (FLOMAX) 0.4 MG CAPS capsule Take 1 capsule by mouth once daily 05/09/22  Yes Luetta Nutting, DO  atorvastatin (LIPITOR) 20 MG tablet Take 1 tablet (20 mg total) by mouth daily. 07/12/21 10/10/21  Luetta Nutting, DO  meloxicam (MOBIC) 15 MG tablet One tab PO qAM with a meal for 2 weeks, then daily prn pain. Patient not taking: Reported on 06/03/2022 12/24/21   Luetta Nutting, DO  omeprazole (PRILOSEC) 20 MG capsule Take 1 capsule (20 mg total) by mouth daily. Patient not taking: Reported on 06/03/2022 03/29/22   Davonna Belling, MD  ondansetron (ZOFRAN-ODT) 4 MG disintegrating tablet Take 1 tablet (4 mg total) by mouth every 8 (eight) hours as needed for nausea or vomiting. Patient not taking: Reported on 06/03/2022 03/29/22   Davonna Belling, MD  traMADol (ULTRAM) 50 MG tablet TAKE 1 TO 2 TABLETS BY MOUTH EVERY 8 HOURS AS NEEDED FOR MODERATE PAIN . DO NOT EXCEED 6 PER 24 HOURS 05/09/22   Luetta Nutting, DO    Current Outpatient Medications  Medication Sig Dispense Refill   Cholecalciferol (VITAMIN D3) 10 MCG (400 UNIT) CAPS Take by mouth.     cyanocobalamin (VITAMIN B12) 1000 MCG tablet Take 1,000 mcg by mouth daily.     Ginkgo Biloba 40 MG TABS Take by mouth.     lisinopril-hydrochlorothiazide (ZESTORETIC) 20-25 MG tablet Take 1 tablet by mouth once daily 90 tablet 1   tamsulosin (FLOMAX) 0.4 MG CAPS capsule Take 1 capsule by mouth once daily 21  capsule 2   atorvastatin (LIPITOR) 20 MG tablet Take 1 tablet (20 mg total) by mouth daily. 90 tablet 3   meloxicam (MOBIC) 15 MG tablet One tab PO qAM with a meal for 2 weeks, then daily prn pain. (Patient not taking: Reported on 06/03/2022) 30 tablet 3   omeprazole (PRILOSEC) 20 MG capsule Take 1 capsule (20 mg total) by mouth daily. (Patient not taking: Reported on 06/03/2022) 14 capsule 0   ondansetron (ZOFRAN-ODT) 4 MG disintegrating tablet Take 1 tablet (4 mg total) by mouth every 8 (eight) hours as needed for nausea or vomiting. (Patient not taking: Reported on 06/03/2022) 8 tablet 0   traMADol (ULTRAM) 50 MG tablet TAKE 1 TO 2 TABLETS  BY MOUTH EVERY 8 HOURS AS NEEDED FOR MODERATE PAIN . DO NOT EXCEED 6 PER 24 HOURS 60 tablet 0   Current Facility-Administered Medications  Medication Dose Route Frequency Provider Last Rate Last Admin   0.9 %  sodium chloride infusion  500 mL Intravenous Once Jettie Mannor V, DO        Allergies as of 06/17/2022 - Review Complete 06/17/2022  Allergen Reaction Noted   Morphine and related  02/17/2013    Family History  Problem Relation Age of Onset   Lung disease Mother    Hypertension Mother    Lung disease Father    Alcoholism Father    Colon cancer Neg Hx    Colon polyps Neg Hx    Esophageal cancer Neg Hx    Stomach cancer Neg Hx    Rectal cancer Neg Hx     Social History   Socioeconomic History   Marital status: Married    Spouse name: [name]   Number of children: 4   Years of education: 13   Highest education level: Occupational hygienist History   Occupation: retired    Comment: Pharmacist, hospital  Tobacco Use   Smoking status: Never   Smokeless tobacco: Never  Scientific laboratory technician Use: Never used  Substance and Sexual Activity   Alcohol use: Not Currently    Comment: rarely   Drug use: No   Sexual activity: Yes    Partners: Female  Other Topics Concern   Not on file  Social History Narrative   Lives with his wife. 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   Social Determinants of Health   Financial Resource Strain: Low Risk  (09/23/2021)   Overall Financial Resource Strain (CARDIA)    Difficulty of Paying Living Expenses: Not hard at all  Food Insecurity: No Food Insecurity (09/23/2021)   Hunger Vital Sign    Worried About Running Out of Food in the Last Year: Never true    Ran Out of Food in the Last Year: Never true  Transportation Needs: No Transportation Needs (09/23/2021)   PRAPARE - Hydrologist (Medical): No    Lack of Transportation (Non-Medical): No  Physical Activity:  Inactive (09/23/2021)   Exercise Vital Sign    Days of Exercise per Week: 0 days    Minutes of Exercise per Session: 0 min  Stress: No Stress Concern Present (09/23/2021)   [place]    Feeling of Stress : Not at all  Social Connections: Moderately Integrated (09/23/2021)   Social Connection and Isolation Panel [NHANES]    Frequency of Communication with Friends and Family: More than three times a week  Frequency of Social Gatherings with Friends and Family: Once a week    Attends Religious Services: More than 4 times per year    Active Member of [employer] or Organizations: No    Attends Archivist Meetings: Never    Marital Status: Married  Human resources officer Violence: Not At Risk (09/23/2021)   Humiliation, Afraid, Rape, and Kick questionnaire    Fear of Current or Ex-Partner: No    Emotionally Abused: No    Physically Abused: No    Sexually Abused: No    Physical Exam: Vital signs in last 24 hours: '@BP'$  (!) 154/90   Pulse 68   Temp 98.6 F (37 C) (Temporal)   Ht '5\' 10"'$  (1.778 m)   Wt 290 lb (131.5 kg)   SpO2 97%   BMI 41.61 kg/m  GEN: NAD EYE: Sclerae anicteric ENT: MMM CV: Non-tachycardic Pulm: CTA b/l GI: Soft, NT/ND NEURO:  Alert & Oriented x 3   Gerrit Heck, DO [facility]   06/17/2022 3:23 PM

## 2022-06-17 NOTE — Progress Notes (Signed)
Pt's states no medical or surgical changes since previsit or office visit. 

## 2022-06-17 NOTE — Patient Instructions (Signed)
YOU HAD AN ENDOSCOPIC PROCEDURE TODAY AT THE [facility]:   Refer to the procedure report that was given to you for any specific questions about what was found during the examination.  If the procedure report does not answer your questions, please call your gastroenterologist to clarify.  If you requested that your care partner not be given the details of your procedure findings, then the procedure report has been included in a sealed envelope for you to review at your convenience later.  YOU SHOULD EXPECT: Some feelings of bloating in the abdomen. Passage of more gas than usual.  Walking can help get rid of the air that was put into your GI tract during the procedure and reduce the bloating. If you had a lower endoscopy (such as a colonoscopy or flexible sigmoidoscopy) you may notice spotting of blood in your stool or on the toilet paper. If you underwent a bowel prep for your procedure, you may not have a normal bowel movement for a few days.  Please Note:  You might notice some irritation and congestion in your nose or some drainage.  This is from the oxygen used during your procedure.  There is no need for concern and it should clear up in a day or so.  SYMPTOMS TO REPORT IMMEDIATELY:  Following lower endoscopy (colonoscopy or flexible sigmoidoscopy):  Excessive amounts of blood in the stool  Significant tenderness or worsening of abdominal pains  Swelling of the abdomen that is new, acute  Fever of 100F or higher  For urgent or emergent issues, a gastroenterologist can be reached at any hour by calling [phone]. Do not use MyChart messaging for urgent concerns.    DIET:  We do recommend a small meal at first, but then you may proceed to your regular diet.  Drink plenty of fluids but you should avoid alcoholic beverages for 24 hours.  ACTIVITY:  You should plan to take it easy for the rest of today and you should NOT DRIVE or use heavy machinery until tomorrow (because of  the sedation medicines used during the test).    FOLLOW UP: Our staff will call the number listed on your records the next business day following your procedure.  We will call around 7:15- 8:00 am to check on you and address any questions or concerns that you may have regarding the information given to you following your procedure. If we do not reach you, we will leave a message.     If any biopsies were taken you will be contacted by phone or by letter within the next 1-3 weeks.  Please call us at [phone] if you have not heard about the biopsies in 3 weeks.    SIGNATURES/CONFIDENTIALITY: You and/or your care partner have signed paperwork which will be entered into your electronic medical record.  These signatures attest to the fact that that the information above on your After Visit Summary has been reviewed and is understood.  Full responsibility of the confidentiality of this discharge information lies with you and/or your care-partner.  

## 2022-06-18 ENCOUNTER — Encounter: Payer: Medicare PPO | Admitting: Rehabilitative and Restorative Service Providers"

## 2022-06-18 ENCOUNTER — Telehealth: Payer: Self-pay

## 2022-06-18 NOTE — Telephone Encounter (Signed)
  Follow up Call-     06/17/2022    2:48 PM 05/24/2021    8:56 AM  Call back number  Post procedure Call Back phone  # [phone]  Permission to leave phone message Yes Yes     Patient questions:  Do you have a fever, pain , or abdominal swelling? No. Pain Score  0 *  Have you tolerated food without any problems? Yes.    Have you been able to return to your normal activities? Yes.    Do you have any questions about your discharge instructions: Diet   No. Medications  No. Follow up visit  No.  Do you have questions or concerns about your Care? No.  Actions: * If pain score is 4 or above: No action needed, pain <4.

## 2022-06-24 ENCOUNTER — Ambulatory Visit: Payer: Medicare PPO | Attending: Family Medicine | Admitting: Rehabilitative and Restorative Service Providers"

## 2022-06-24 ENCOUNTER — Encounter: Payer: Self-pay | Admitting: Rehabilitative and Restorative Service Providers"

## 2022-06-24 DIAGNOSIS — R29898 Other symptoms and signs involving the musculoskeletal system: Secondary | ICD-10-CM

## 2022-06-24 DIAGNOSIS — M5459 Other low back pain: Secondary | ICD-10-CM | POA: Diagnosis not present

## 2022-06-24 NOTE — Therapy (Signed)
OUTPATIENT PHYSICAL THERAPY TREATMENT   Patient Name: John Morrow MRN: [id number] DOB:06-09-1951, 71 y.o., male Today's Date: 06/24/2022   PT End of Session - 06/24/22 0811     Visit Number 17    Number of Visits 28    Date for PT Re-Evaluation 08/05/22    Authorization Time Period 06/24/22-08/05/22    Authorization - Visit Number 1    Authorization - Number of Visits 12    Progress Note Due on Visit 20    PT Start Time 0805    PT Stop Time 0845    PT Time Calculation (min) 40 min    Activity Tolerance Patient tolerated treatment well               Past Medical History:  Diagnosis Date   Arthritis    Back pain    Diverticulosis of colon without hemorrhage 06/12/2016   On Colonoscopy 11/06/2010   HLD (hyperlipidemia) 05/29/2016   Hyperlipemia    Hypertension    Joint pain    Morbid obesity ([place]) 05/29/2016   Prediabetes    Right hip pain    needs hip replacement   Sleep apnea    on CPAP   Past Surgical History:  Procedure Laterality Date   EYE SURGERY     age 41   KNEE SURGERY     REPLACEMENT TOTAL HIP W/  RESURFACING IMPLANTS Right 75/1025   Dr. Aleda Grana   Patient Active Problem List   Diagnosis Date Noted   Acute pancreatitis 04/07/2022   OSA (obstructive sleep apnea) 02/19/2022   Gout 12/24/2021   Chronic rhinitis 08/21/2021   Impotence of organic origin 02/13/2021   Lumbar spondylosis 10/09/2020   Pre-operative exam 07/18/2020   [place]ca 05/01/2020   Epiploic appendagitis 02/20/2020   Bilateral shoulder region arthritis 04/13/2019   Vitamin D deficiency 02/08/2019   Insulin resistance 02/08/2019   Class 2 severe obesity with serious comorbidity and body mass index (BMI) of 39.0 to 39.9 in adult ([place]) 02/08/2019   Prediabetes 02/08/2019   Chronic pain 09/28/2018   Hip pain 06/28/2018   Dependent edema 05/05/2017   Primary osteoarthritis of left knee 09/25/2016   BPH (benign prostatic hyperplasia) 05/29/2016   Urine frequency 05/29/2016    HTN (hypertension) 05/29/2016   HLD (hyperlipidemia) 05/29/2016   Class 2 obesity due to excess calories without serious comorbidity with body mass index (BMI) of 39.0 to 39.9 in adult 05/29/2016   Sleep apnea 03/31/2014    PCP: Dr Luetta Nutting  REFERRING PROVIDER: Dr Luetta Nutting  REFERRING DIAG: Acute LBP  Rationale for Evaluation and Treatment Rehabilitation  THERAPY DIAG:  Other low back pain  Other symptoms and signs involving the musculoskeletal system  ONSET DATE: 04/07/22; history of LBP for ~ 5 years   SUBJECTIVE:  SUBJECTIVE STATEMENT: "She got me good yesterday with her elbow".  Pt states this gives him relief each time.  He reports he went to pool 1x since last aquatic visit.   PERTINENT HISTORY:  HTN; hyperlipemia; Rt THA; obesity; back pain; prediabetes; sleep apnea;   PAIN:   Are you having pain? yes: NPRS scale:2/10  Pain location:  Lt low back/ Lt hip Pain description: tight  Aggravating factors: first thing in morning; prolonged standing; walking; steps; lifting Relieving factors: deep tissue release; sitting; not moving  PRECAUTIONS: None  PATIENT GOALS   to be pain free, or comfortable    OBJECTIVE:   TODAY'S TREATMENT:  06/24/22: There Ex: Nu-step L7 x 10 minutes LE/UEs (UE 10) Standing: Shoulder flexion facing wall x 10 reps x 2 sets Modified deadlift 15% KB from 8" stool x 15 Hip extension facing wall then counter x 10 x 2  Bow and arrow black TB x 10 reps x 2 sets Standing back at wall for glut set/hip ext 2 min LE swing standing on step each side 20-30 x Working on standing posture/alignment w/hip ext  Manual: STM Lt QL  pt sidelying  - deep tissue release work  Self release using various balls to reproduce deep tissue    release in side lying Lt  side stretch sitting with pillows under Rt trunk leaning over pillows for stretch through the Lt QL 1 min x 3 reps    Aquatic Therapy  06/11/22: Pt seen for aquatic therapy today.  Treatment took place in water 3.25-4.75 ft in depth at the [facility]. Temp of water was 92.  Pt entered/exited the pool via stairs independently with bilat rail. * Forward / backward walking;  side stepping  * Holding rainbow hand buoys- tandem gait forward / backward *Holding yellow hand buoys:  side stepping with arm / leg abdct/ add R/L; side step squat with arm abdct/add (challenge for coordination) ; walking forward lunges/ split squats  * holding wall:  single leg clam x 10; quick hip abdct/add x 10 each with core engaged; hip ext x 5 each * Plank with hip ext (hands on bench in water) *  hip flexor stretch x 20s x 2 - cues for posture and form * staggered stance kick board row - playing with stance distance and speed * STS from 4th step without UE support and 5 count descent  * Warrior 1, lifting noodle off of water towards ceiling, in staggered stance x 5 each side (improved) *forward/backward walking with yellow hand buoys at sides under water for core engagement. 06/10/22 There Ex: Nu-step L7 x 10 minutes LE/UEs (UE 10) Standing: Shoulder flexion facing wall x 10 reps x 2 sets Modified deadlift 15% KB from 8" stool x 15 Deadlift with black TB x 15 Hip extension facing wall then counter x 10 x 2 sets Bow and arrow black TB x 10 reps x 2 sets Standing back at wall for glut set/hip extension ~ 2 min  LE swing standing on step each side 20-30 x Working on standing posture and alignment w/hip ext  Manual: STM Lt QL  pt sidelying  - deep tissue release work  Self release using various balls to reproduce deep tissue    release in side lying Lt side stretch sitting with pillows under Rt trunk leaning over pillows for stretch through the Lt QL 1 min x 3 reps        From initial  evaluation: PATIENT SURVEYS:  FOTO 44  MUSCLE LENGTH: Hamstrings: tight Rt ~ 60 deg; Lt 65 deg Hip flexors tight Rt > Lt unable to tolerate [employer] test  POSTURE: rounded shoulders, forward head, decreased lumbar lordosis, increased thoracic kyphosis, posterior pelvic tilt, and flexed trunk   PALPATION: Muscular tightness Lt QL, lats, lumbar paraspinals, iliopsoas  LUMBAR ROM:   Active  A/PROM  eval AROM 05/27/22 AROM 06/24/22  Flexion 45% 70% 70%  Extension -10% 5% 10%  Right lateral flexion 35% 65% 70%  Left lateral flexion 30% 55% 60%  Right rotation 15% 30% 40%  Left rotation 10% 25% 40%   (Blank rows = not tested)  LOWER EXTREMITY ROM:     Active  Right eval Right 05/27/22 Left eval Left 05/27/22  Hip flexion tight Tight end range tight Tight end range  Hip extension -10 0 -10 0  Hip abduction tight tight tight Tight   Hip adduction      Hip internal rotation      Hip external rotation      Knee flexion 90  90   Knee extension WNL's  WNL's   Ankle dorsiflexion      Ankle plantarflexion      Ankle inversion      Ankle eversion       (Blank rows = not tested)  LOWER EXTREMITY MMT:    MMT Right eval  Right 06/24/22   Left eval  Left 06/24/22   Hip flexion 4/5 4+/5 5/5  4/5 4+/5 5/5  Hip extension 4-/5 4/5 4+/5  4/4 4/5 4+/5  Hip abduction 4-/5 4+/5 4+/5  4-/5 4/5 4/5  Hip adduction         Hip internal rotation         Hip external rotation         Knee flexion 5/5 5/5   5/5 5/5   Knee extension 5/5 5/5   5/5 5/5   Ankle dorsiflexion         Ankle plantarflexion         Ankle inversion         Ankle eversion          (Blank rows = not tested)  LUMBAR SPECIAL TESTS:  Straight leg raise test: Negative and Slump test: Negative  GAIT: Distance walked: 80 ft Assistive device utilized: None Comments: Improving gait with continued forward flexed trunk and hips   PATIENT EDUCATION:  Education details:  Person educated: Patient Education method:   Education comprehension:  HOME EXERCISE PROGRAM: Access Code: [email or web address] URL: [email or web address] Date: 06/10/2022 Prepared by: Gillermo Murdoch  Exercises - Prone Press Up On Elbows  - 2 x daily - 7 x weekly - 1 sets - 1 reps - 2-4 min  hold - Prone Gluteal Sets  - 2 x daily - 7 x weekly - 1 sets - 10 reps - 10 sec  hold - Prone Quadriceps Stretch with Strap  - 2 x daily - 7 x weekly - 1 sets - 3 reps - 30 sec  hold - Seated Hip Flexor Stretch  - 2 x daily - 7 x weekly - 1 sets - 3 reps - 30 sec  hold - Supine Bridge  - 1 x daily - 7 x weekly - 1-2 sets - 10 reps - 5-10 sec  hold - Supine Transversus Abdominis Bracing with Pelvic Floor Contraction  - 2 x daily - 7 x weekly - 1 sets - 10 reps - 10sec  hold - Supine Diaphragmatic  Breathing  - 2 x daily - 7 x weekly - 1 sets - 10 reps - 4-6 sec  hold - Standing Hip Extension with Counter Support  - 2 x daily - 7 x weekly - 1-2 sets - 10 reps - 3-5 sec  hold - Standing Piriformis Release with Ball at [employer]  - 2 x daily - 7 x weekly - 30-60 sec  hold - Hooklying Isometric Clamshell  - 2 x daily - 7 x weekly - 1 sets - 10 reps - 3 sec  hold - Wall Quarter Squat  - 2 x daily - 7 x weekly - 1-2 sets - 10 reps - 5-10 sec  hold - Anti-Rotation Lateral Stepping with Press  - 2 x daily - 7 x weekly - 1-2 sets - 10 reps - 2-3 sec  hold - Drawing Bow  - 1 x daily - 7 x weekly - 1 sets - 10 reps - 3 sec  hold - Modified Deadlift with Pelvic Contraction  - 1 x daily - 7 x weekly - 1 sets - 10 reps - Deadlift with Resistance  - 1 x daily - 3-4 x weekly - 1 sets - 10 reps - Standing shoulder flexion wall slides  - 1 x daily - 7 x weekly - 1 sets - 5-10 reps - 10 sec  hold - Standing Hip Extension at Wall  - 1 x daily - 7 x weekly - 1 sets - 10 reps - 3 sec  hold  Aquatic : - TL Sidebending Stretch - Single Arm Overhead  - 1 x daily - 7 x weekly - 2 sets - 10-30 sec hold - Side Stepping  - 1 x daily - 3 x weekly - 3 sets - 10 reps - Backwards  Walking  - 1 x daily - 3 x weekly - 3 sets - Walking Tandem Stance  - 1 x daily - 3 x weekly - 3 sets - Standing 3-Way Leg Reach  - 1 x daily - 3 x weekly - 1 sets - 10 reps - Split Stance Shoulder Row with Resistance  - 1 x daily - 3 x weekly - 2 sets - 10 reps - Single Leg Stance Clamshell  - 1 x daily - 3 x weekly - 1 sets - 10 reps - Warrior I in [employer] with [employer]  - 1 x daily - 3 x weekly - 1 sets - 5-10 reps - Sit to Stand  - 1 x daily - 7 x weekly - 1 sets - 10 reps - 3-5 sec  hold - Plank with Hip Extension at [employer]  - 1 x daily - 3 x weekly - 1 sets - 10 reps    ASSESSMENT:  CLINICAL IMPRESSION: Patient returns with flare up of Lt sided LBP. He has palpable tightness in the Lt QL and lumbar paraspinals. Good release in muscular tightness noted with treatment and exercises. Patient is gradually progressing toward goals of therapy.    OBJECTIVE IMPAIRMENTS Abnormal gait, decreased activity tolerance, decreased balance, decreased endurance, decreased mobility, decreased ROM, decreased strength, hypomobility, increased fascial restrictions, impaired flexibility, improper body mechanics, postural dysfunction, obesity, and pain.   ACTIVITY LIMITATIONS carrying, lifting, bending, sitting, standing, squatting, transfers, and bed mobility  PARTICIPATION LIMITATIONS: community activity and yard work  [place], Past/current experiences, and 1-2 comorbidities: HTN; Rt THA; prediabetes  are also affecting patient's functional outcome.   REHAB POTENTIAL: Good  CLINICAL DECISION MAKING: Stable/uncomplicated  EVALUATION COMPLEXITY: Low   GOALS: Goals reviewed with patient? Yes   LONG TERM GOALS: Target date: 08/05/2022  Improve posture and alignment with patient to demonstrate neutral spinal and hip alignment in standing Baseline:  Goal status: ongoing  2.  Decrease back pain by 50-75% allowing patient to participate in normal functional activities  with less pain and limitation  Baseline:  Goal status: on going  3.  Increase spinal extension to 10-20% range in standing  Baseline:  Goal status: on going  4.  Independent in HEP (including aquatic program) Baseline:  Goal status: on going  5.  Improve functional limitation score to 54 Baseline:  Goal status: on going     PLAN: PT FREQUENCY: 2x/wk  PT DURATION: 6 weeks  PLANNED INTERVENTIONS: Therapeutic exercises, Therapeutic activity, Neuromuscular re-education, Balance training, Gait training, Patient/Family education, Self Care, Joint mobilization, Aquatic Therapy, Dry Needling, Electrical stimulation, Spinal mobilization, Cryotherapy, Moist heat, Taping, Manual therapy, and Re-evaluation.  PLAN FOR NEXT SESSION: review and progress with HEP; continue of DN to Lt QL, lats, lumbar spine; progress with dynamic stabilization; continue education and home instruction; modalities as indicated.    Everardo All, PT, MPH 06/24/22 8:34 AM [facility] [address] [place], [place], [address] Phone: [phone]   Fax:  [phone]

## 2022-06-30 ENCOUNTER — Ambulatory Visit (HOSPITAL_BASED_OUTPATIENT_CLINIC_OR_DEPARTMENT_OTHER): Payer: Medicare PPO | Attending: Family Medicine | Admitting: Physical Therapy

## 2022-06-30 ENCOUNTER — Encounter (HOSPITAL_BASED_OUTPATIENT_CLINIC_OR_DEPARTMENT_OTHER): Payer: Self-pay | Admitting: Physical Therapy

## 2022-06-30 DIAGNOSIS — M5459 Other low back pain: Secondary | ICD-10-CM | POA: Diagnosis not present

## 2022-06-30 DIAGNOSIS — R29898 Other symptoms and signs involving the musculoskeletal system: Secondary | ICD-10-CM | POA: Diagnosis not present

## 2022-06-30 NOTE — Therapy (Signed)
OUTPATIENT PHYSICAL THERAPY TREATMENT   Patient Name: John Morrow MRN: [id number] DOB:February 14, 1951, 71 y.o., male Today's Date: 06/30/2022   PT End of Session - 06/30/22 0909     Visit Number 18    Number of Visits 28    Date for PT Re-Evaluation 08/05/22    Authorization Time Period 06/24/22-08/05/22    Authorization - Visit Number 2    Authorization - Number of Visits 12    Progress Note Due on Visit 13    PT Start Time 0858    PT Stop Time 0940    PT Time Calculation (min) 42 min    Activity Tolerance Patient tolerated treatment well    Behavior During Therapy [facility] for tasks assessed/performed               Past Medical History:  Diagnosis Date   Arthritis    Back pain    Diverticulosis of colon without hemorrhage 06/12/2016   On Colonoscopy 11/06/2010   HLD (hyperlipidemia) 05/29/2016   Hyperlipemia    Hypertension    Joint pain    Morbid obesity ([place]) 05/29/2016   Prediabetes    Right hip pain    needs hip replacement   Sleep apnea    on CPAP   Past Surgical History:  Procedure Laterality Date   EYE SURGERY     age 53   KNEE SURGERY     REPLACEMENT TOTAL HIP W/  RESURFACING IMPLANTS Right 38/2505   Dr. Aleda Grana   Patient Active Problem List   Diagnosis Date Noted   Acute pancreatitis 04/07/2022   OSA (obstructive sleep apnea) 02/19/2022   Gout 12/24/2021   Chronic rhinitis 08/21/2021   Impotence of organic origin 02/13/2021   Lumbar spondylosis 10/09/2020   Pre-operative exam 07/18/2020   [place] 05/01/2020   Epiploic appendagitis 02/20/2020   Bilateral shoulder region arthritis 04/13/2019   Vitamin D deficiency 02/08/2019   Insulin resistance 02/08/2019   Class 2 severe obesity with serious comorbidity and body mass index (BMI) of 39.0 to 39.9 in adult ([place]) 02/08/2019   Prediabetes 02/08/2019   Chronic pain 09/28/2018   Hip pain 06/28/2018   Dependent edema 05/05/2017   Primary osteoarthritis of left knee 09/25/2016   BPH (benign  prostatic hyperplasia) 05/29/2016   Urine frequency 05/29/2016   HTN (hypertension) 05/29/2016   HLD (hyperlipidemia) 05/29/2016   Class 2 obesity due to excess calories without serious comorbidity with body mass index (BMI) of 39.0 to 39.9 in adult 05/29/2016   Sleep apnea 03/31/2014    PCP: Dr Luetta Nutting  REFERRING PROVIDER: Dr Luetta Nutting  REFERRING DIAG: Acute LBP  Rationale for Evaluation and Treatment Rehabilitation  THERAPY DIAG:  Other low back pain  Other symptoms and signs involving the musculoskeletal system  ONSET DATE: 04/07/22; history of LBP for ~ 5 years   SUBJECTIVE:  SUBJECTIVE STATEMENT: Pt report his pain has been elevated for 3 wks. He's not sure what he's done.   PERTINENT HISTORY:  HTN; hyperlipemia; Rt THA; obesity; back pain; prediabetes; sleep apnea;   PAIN:   Are you having pain? yes: NPRS scale:4-5/10  Pain location:  Lt low back/ Lt hip Pain description: tight / ache Aggravating factors: first thing in morning; prolonged standing; walking; steps; lifting Relieving factors: deep tissue release; sitting; not moving  PRECAUTIONS: None  PATIENT GOALS   to be pain free, or comfortable    OBJECTIVE:   TODAY'S TREATMENT:  Aquatic Therapy  06/30/22: Pt seen for aquatic therapy today.  Treatment took place in water 3.25-4.75 ft in depth at the [place]. Temp of water was 92.  Pt entered/exited the pool via stairs independently with bilat rail. * Forward / backward walking;  side stepping - without support  * side step squat with yellow hand buoys and arm add * Holding yellow  hand buoys at surface - tandem gait forward / backward; 3 way leg reach x 10 each * staggered stance kick board row - playing with stance distance and speed * Holding  wall:  single leg clam x 10 each, cues to isolate hip and not twist truck * Warrior 1, lifting noodle off of water towards ceiling, in staggered stance x 5 each side (improved) * Plank with hip ext (hands on bench in water)- cues for form * squats pushing yellow hand buoy under water x 10  *  hip flexor stretch x 20s x 2; side stretch x 15 sec - cues for posture and form  06/24/22: There Ex: Nu-step L7 x 10 minutes LE/UEs (UE 10) Standing: Shoulder flexion facing wall x 10 reps x 2 sets Modified deadlift 15% KB from 8" stool x 15 Hip extension facing wall then counter x 10 x 2  Bow and arrow black TB x 10 reps x 2 sets Standing back at wall for glut set/hip ext 2 min LE swing standing on step each side 20-30 x Working on standing posture/alignment w/hip ext  Manual: STM Lt QL  pt sidelying  - deep tissue release work  Self release using various balls to reproduce deep tissue    release in side lying Lt side stretch sitting with pillows under Rt trunk leaning over pillows for stretch through the Lt QL 1 min x 3 reps    Aquatic Therapy  06/11/22: Pt seen for aquatic therapy today.  Treatment took place in water 3.25-4.75 ft in depth at the [facility]. Temp of water was 92.  Pt entered/exited the pool via stairs independently with bilat rail. * Forward / backward walking;  side stepping  * Holding rainbow hand buoys- tandem gait forward / backward *Holding yellow hand buoys:  side stepping with arm / leg abdct/ add R/L; side step squat with arm abdct/add (challenge for coordination) ; walking forward lunges/ split squats  * holding wall:  single leg clam x 10; quick hip abdct/add x 10 each with core engaged; hip ext x 5 each * Plank with hip ext (hands on bench in water) *  hip flexor stretch x 20s x 2 - cues for posture and form * staggered stance kick board row - playing with stance distance and speed * STS from 4th step without UE support and 5 count descent  *  Warrior 1, lifting noodle off of water towards ceiling, in staggered stance x 5 each side (improved) *forward/backward  walking with yellow hand buoys at sides under water for core engagement. 06/10/22 There Ex: Nu-step L7 x 10 minutes LE/UEs (UE 10) Standing: Shoulder flexion facing wall x 10 reps x 2 sets Modified deadlift 15% KB from 8" stool x 15 Deadlift with black TB x 15 Hip extension facing wall then counter x 10 x 2 sets Bow and arrow black TB x 10 reps x 2 sets Standing back at wall for glut set/hip extension ~ 2 min  LE swing standing on step each side 20-30 x Working on standing posture and alignment w/hip ext  Manual: STM Lt QL  pt sidelying  - deep tissue release work  Self release using various balls to reproduce deep tissue    release in side lying Lt side stretch sitting with pillows under Rt trunk leaning over pillows for stretch through the Lt QL 1 min x 3 reps        From initial evaluation: PATIENT SURVEYS:  FOTO 44  MUSCLE LENGTH: Hamstrings: tight Rt ~ 60 deg; Lt 65 deg Hip flexors tight Rt > Lt unable to tolerate [employer] test  POSTURE: rounded shoulders, forward head, decreased lumbar lordosis, increased thoracic kyphosis, posterior pelvic tilt, and flexed trunk   PALPATION: Muscular tightness Lt QL, lats, lumbar paraspinals, iliopsoas  LUMBAR ROM:   Active  A/PROM  eval AROM 05/27/22 AROM 06/24/22  Flexion 45% 70% 70%  Extension -10% 5% 10%  Right lateral flexion 35% 65% 70%  Left lateral flexion 30% 55% 60%  Right rotation 15% 30% 40%  Left rotation 10% 25% 40%   (Blank rows = not tested)  LOWER EXTREMITY ROM:     Active  Right eval Right 05/27/22 Left eval Left 05/27/22  Hip flexion tight Tight end range tight Tight end range  Hip extension -10 0 -10 0  Hip abduction tight tight tight Tight   Hip adduction      Hip internal rotation      Hip external rotation      Knee flexion 90  90   Knee extension WNL's  WNL's   Ankle  dorsiflexion      Ankle plantarflexion      Ankle inversion      Ankle eversion       (Blank rows = not tested)  LOWER EXTREMITY MMT:    MMT Right eval  Right 06/24/22  Left eval  Left 06/24/22   Hip flexion 4/5 4+/5 5/5 4/5 4+/5 5/5  Hip extension 4-/5 4/5 4+/5 4/4 4/5 4+/5  Hip abduction 4-/5 4+/5 4+/5 4-/5 4/5 4/5  Hip adduction        Hip internal rotation        Hip external rotation        Knee flexion 5/5 5/5  5/5 5/5   Knee extension 5/5 5/5  5/5 5/5   Ankle dorsiflexion        Ankle plantarflexion        Ankle inversion        Ankle eversion         (Blank rows = not tested)  LUMBAR SPECIAL TESTS:  Straight leg raise test: Negative and Slump test: Negative  GAIT: Distance walked: 80 ft Assistive device utilized: None Comments: Improving gait with continued forward flexed trunk and hips   PATIENT EDUCATION:  Education details: form with aquatics exercises Person educated: Patient Education method: Veterinary surgeon; demonstration Education comprehension:returned dem  HOME EXERCISE PROGRAM: Access Code: [email or web address] URL: [email or web address] Date: 06/10/2022 Prepared  by: Gillermo Murdoch  Exercises - Prone Press Up On Elbows  - 2 x daily - 7 x weekly - 1 sets - 1 reps - 2-4 min  hold - Prone Gluteal Sets  - 2 x daily - 7 x weekly - 1 sets - 10 reps - 10 sec  hold - Prone Quadriceps Stretch with Strap  - 2 x daily - 7 x weekly - 1 sets - 3 reps - 30 sec  hold - Seated Hip Flexor Stretch  - 2 x daily - 7 x weekly - 1 sets - 3 reps - 30 sec  hold - Supine Bridge  - 1 x daily - 7 x weekly - 1-2 sets - 10 reps - 5-10 sec  hold - Supine Transversus Abdominis Bracing with Pelvic Floor Contraction  - 2 x daily - 7 x weekly - 1 sets - 10 reps - 10sec  hold - Supine Diaphragmatic Breathing  - 2 x daily - 7 x weekly - 1 sets - 10 reps - 4-6 sec  hold - Standing Hip Extension with Counter Support  - 2 x daily - 7 x weekly - 1-2 sets - 10 reps - 3-5 sec  hold -  Standing Piriformis Release with Ball at [employer]  - 2 x daily - 7 x weekly - 30-60 sec  hold - Hooklying Isometric Clamshell  - 2 x daily - 7 x weekly - 1 sets - 10 reps - 3 sec  hold - Wall Quarter Squat  - 2 x daily - 7 x weekly - 1-2 sets - 10 reps - 5-10 sec  hold - Anti-Rotation Lateral Stepping with Press  - 2 x daily - 7 x weekly - 1-2 sets - 10 reps - 2-3 sec  hold - Drawing Bow  - 1 x daily - 7 x weekly - 1 sets - 10 reps - 3 sec  hold - Modified Deadlift with Pelvic Contraction  - 1 x daily - 7 x weekly - 1 sets - 10 reps - Deadlift with Resistance  - 1 x daily - 3-4 x weekly - 1 sets - 10 reps - Standing shoulder flexion wall slides  - 1 x daily - 7 x weekly - 1 sets - 5-10 reps - 10 sec  hold - Standing Hip Extension at Wall  - 1 x daily - 7 x weekly - 1 sets - 10 reps - 3 sec  hold  Aquatic : - TL Sidebending Stretch - Single Arm Overhead  - 1 x daily - 7 x weekly - 2 sets - 10-30 sec hold - Side Stepping  - 1 x daily - 3 x weekly - 3 sets - 10 reps - Backwards Walking  - 1 x daily - 3 x weekly - 3 sets - Walking Tandem Stance  - 1 x daily - 3 x weekly - 3 sets - Standing 3-Way Leg Reach  - 1 x daily - 3 x weekly - 1 sets - 10 reps - Split Stance Shoulder Row with Resistance  - 1 x daily - 3 x weekly - 2 sets - 10 reps - Single Leg Stance Clamshell  - 1 x daily - 3 x weekly - 1 sets - 10 reps - Warrior I in [employer] with [employer]  - 1 x daily - 3 x weekly - 1 sets - 5-10 reps - Sit to Stand  - 1 x daily - 7 x weekly - 1 sets - 10  reps - 3-5 sec  hold - Plank with Hip Extension at [employer]  - 1 x daily - 3 x weekly - 1 sets - 10 reps    ASSESSMENT:  CLINICAL IMPRESSION: Pt reports elimination of hip pain once exercising in water ~10 min.  Encouraged pt to go to pool and complete aquatic HEP on own and report back any issues/ need for modifications next session as he is close to independent with aquatic HEP.  Reviewed current program (3 wks since last session) with minor  cues need form - no cues needed for posture. Pt is progressing towards remaining goals.    OBJECTIVE IMPAIRMENTS Abnormal gait, decreased activity tolerance, decreased balance, decreased endurance, decreased mobility, decreased ROM, decreased strength, hypomobility, increased fascial restrictions, impaired flexibility, improper body mechanics, postural dysfunction, obesity, and pain.   ACTIVITY LIMITATIONS carrying, lifting, bending, sitting, standing, squatting, transfers, and bed mobility  PARTICIPATION LIMITATIONS: community activity and yard work  [place], Past/current experiences, and 1-2 comorbidities: HTN; Rt THA; prediabetes  are also affecting patient's functional outcome.   REHAB POTENTIAL: Good  CLINICAL DECISION MAKING: Stable/uncomplicated  EVALUATION COMPLEXITY: Low   GOALS: Goals reviewed with patient? Yes   LONG TERM GOALS: Target date: 08/05/2022  Improve posture and alignment with patient to demonstrate neutral spinal and hip alignment in standing Baseline:  Goal status: ongoing  2.  Decrease back pain by 50-75% allowing patient to participate in normal functional activities with less pain and limitation  Baseline:  Goal status: on going  3.  Increase spinal extension to 10-20% range in standing  Baseline:  Goal status: on going  4.  Independent in HEP (including aquatic program) Baseline:  Goal status: on going  5.  Improve functional limitation score to 54 Baseline:  Goal status: on going     PLAN: PT FREQUENCY: 2x/wk  PT DURATION: 6 weeks  PLANNED INTERVENTIONS: Therapeutic exercises, Therapeutic activity, Neuromuscular re-education, Balance training, Gait training, Patient/Family education, Self Care, Joint mobilization, Aquatic Therapy, Dry Needling, Electrical stimulation, Spinal mobilization, Cryotherapy, Moist heat, Taping, Manual therapy, and Re-evaluation.  PLAN FOR NEXT SESSION: review and progress with HEP; continue  of DN to Lt QL, lats, lumbar spine; progress with dynamic stabilization; continue education and home instruction; modalities as indicated.  Finalize aquatics    [place], [place] 06/30/22 9:39 AM [facility] [address] [place], [place], [address] Phone: [phone]   Fax:  [phone]

## 2022-07-01 ENCOUNTER — Ambulatory Visit: Payer: Medicare PPO | Admitting: Rehabilitative and Restorative Service Providers"

## 2022-07-01 ENCOUNTER — Encounter: Payer: Self-pay | Admitting: Rehabilitative and Restorative Service Providers"

## 2022-07-01 DIAGNOSIS — M5459 Other low back pain: Secondary | ICD-10-CM

## 2022-07-01 DIAGNOSIS — G4733 Obstructive sleep apnea (adult) (pediatric): Secondary | ICD-10-CM | POA: Diagnosis not present

## 2022-07-01 DIAGNOSIS — R29898 Other symptoms and signs involving the musculoskeletal system: Secondary | ICD-10-CM | POA: Diagnosis not present

## 2022-07-01 NOTE — Therapy (Addendum)
OUTPATIENT PHYSICAL THERAPY TREATMENT   Patient Name: John Morrow MRN: [id number] DOB:[birth year], 71 y.o., male Today's Date: 07/01/2022   PT End of Session - 07/01/22 0826     Visit Number 19    Number of Visits 28    Date for PT Re-Evaluation 08/05/22    Authorization Time Period 06/24/22-08/05/22    Authorization - Visit Number 3    Authorization - Number of Visits 12    Progress Note Due on Visit 63    PT Start Time 2876    PT Stop Time 8115    PT Time Calculation (min) 45 min               Past Medical History:  Diagnosis Date   Arthritis    Back pain    Diverticulosis of colon without hemorrhage 06/12/2016   On Colonoscopy 11/06/2010   HLD (hyperlipidemia) 05/29/2016   Hyperlipemia    Hypertension    Joint pain    Morbid obesity ([place]) 05/29/2016   Prediabetes    Right hip pain    needs hip replacement   Sleep apnea    on CPAP   Past Surgical History:  Procedure Laterality Date   EYE SURGERY     age 40   KNEE SURGERY     REPLACEMENT TOTAL HIP W/  RESURFACING IMPLANTS Right 72/6203   Dr. Aleda Grana   Patient Active Problem List   Diagnosis Date Noted   Acute pancreatitis 04/07/2022   OSA (obstructive sleep apnea) 02/19/2022   Gout 12/24/2021   Chronic rhinitis 08/21/2021   Impotence of organic origin 02/13/2021   Lumbar spondylosis 10/09/2020   Pre-operative exam 07/18/2020   [place]ca 05/01/2020   Epiploic appendagitis 02/20/2020   Bilateral shoulder region arthritis 04/13/2019   Vitamin D deficiency 02/08/2019   Insulin resistance 02/08/2019   Class 2 severe obesity with serious comorbidity and body mass index (BMI) of 39.0 to 39.9 in adult ([place]) 02/08/2019   Prediabetes 02/08/2019   Chronic pain 09/28/2018   Hip pain 06/28/2018   Dependent edema 05/05/2017   Primary osteoarthritis of left knee 09/25/2016   BPH (benign prostatic hyperplasia) 05/29/2016   Urine frequency 05/29/2016   HTN (hypertension) 05/29/2016   HLD (hyperlipidemia)  05/29/2016   Class 2 obesity due to excess calories without serious comorbidity with body mass index (BMI) of 39.0 to 39.9 in adult 05/29/2016   Sleep apnea 03/31/2014    PCP: Dr Luetta Nutting  REFERRING PROVIDER: Dr Luetta Nutting  REFERRING DIAG: Acute LBP  Rationale for Evaluation and Treatment Rehabilitation  THERAPY DIAG:  Other low back pain  Other symptoms and signs involving the musculoskeletal system  ONSET DATE: 04/07/22; history of LBP for ~ 5 years   SUBJECTIVE:  SUBJECTIVE STATEMENT: Patient reports that his back is better in general. Pt states deep tissue release with PT elbow with him in Rt sidelying gives him relief of pain in the Lt side. Patient has sharp pain when he takes his first few steps after lying down or sitting and standing to walk. After the first couple of steps the pain is much less and he does fine.   PERTINENT HISTORY:  HTN; hyperlipemia; Rt THA; obesity; back pain; prediabetes; sleep apnea;   PAIN:  07/01/22 Are you having pain? yes: NPRS scale:2/10  Pain location:  Lt low back/ Lt hip Pain description: tight  Aggravating factors: first thing in morning; prolonged standing; walking; steps; lifting Relieving factors: deep tissue release; sitting; not moving  PRECAUTIONS: None  PATIENT GOALS   to be pain free, or comfortable    OBJECTIVE:   TODAY'S TREATMENT:  06/30/22: There Ex: Nu-step L7 x 10 minutes LE/UEs (UE 10) Standing: Shoulder flexion facing wall x 10 reps x 2 sets Modified deadlift 20# KB from 8" stool x 15 Hip extension facing wall then counter x 10 x 2  Bow and arrow black TB x 10 reps x 2 sets Standing back at wall for glut set/hip ext 2 min Working on standing posture/alignment w/hip extension Sitting lateral trunk flexion over bolster  and pillows 30-45 sec x 3 reps   Manual: STM Lt QL  pt sidelying  - deep tissue release work using elbow for deep pressure as pt tolerates      Aquatic Therapy  06/11/22: Pt seen for aquatic therapy today.  Treatment took place in water 3.25-4.75 ft in depth at the [facility]. Temp of water was 92.  Pt entered/exited the pool via stairs independently with bilat rail. * Forward / backward walking;  side stepping  * Holding rainbow hand buoys- tandem gait forward / backward *Holding yellow hand buoys:  side stepping with arm / leg abdct/ add R/L; side step squat with arm abdct/add (challenge for coordination) ; walking forward lunges/ split squats  * holding wall:  single leg clam x 10; quick hip abdct/add x 10 each with core engaged; hip ext x 5 each * Plank with hip ext (hands on bench in water) *  hip flexor stretch x 20s x 2 - cues for posture and form * staggered stance kick board row - playing with stance distance and speed * STS from 4th step without UE support and 5 count descent  * Warrior 1, lifting noodle off of water towards ceiling, in staggered stance x 5 each side (improved) *forward/backward walking with yellow hand buoys at sides under water for core engagement.  From initial evaluation: PATIENT SURVEYS:  FOTO 44  MUSCLE LENGTH: Hamstrings: tight Rt ~ 60 deg; Lt 65 deg Hip flexors tight Rt > Lt unable to tolerate [employer] test  POSTURE: rounded shoulders, forward head, decreased lumbar lordosis, increased thoracic kyphosis, posterior pelvic tilt, and flexed trunk   PALPATION: Muscular tightness Lt QL, lats, lumbar paraspinals, iliopsoas  LUMBAR ROM:   Active  A/PROM  eval AROM 05/27/22 AROM 06/30/22  Flexion 45% 70% 70%  Extension -10% 5% 10%  Right lateral flexion 35% 65% 70%  Left lateral flexion 30% 55% 60%  Right rotation 15% 30% 40%  Left rotation 10% 25% 40%   (Blank rows = not tested)  LOWER EXTREMITY ROM:     Active  Right eval  Right 05/27/22 Left eval Left 05/27/22  Hip flexion tight Tight end range  tight Tight end range  Hip extension -10 0 -10 0  Hip abduction tight tight tight Tight   Hip adduction      Hip internal rotation      Hip external rotation      Knee flexion 90  90   Knee extension WNL's  WNL's   Ankle dorsiflexion      Ankle plantarflexion      Ankle inversion      Ankle eversion       (Blank rows = not tested)  LOWER EXTREMITY MMT:    MMT Right eval  Right 06/30/22   Left eval  Left 06/30/22   Hip flexion 4/5 4+/5 5/5  4/5 4+/5 5/5  Hip extension 4-/5 4/5 4+/5  4/4 4/5 4+/5  Hip abduction 4-/5 4+/5 4+/5  4-/5 4/5 4/5  Hip adduction         Hip internal rotation         Hip external rotation         Knee flexion 5/5 5/5   5/5 5/5   Knee extension 5/5 5/5   5/5 5/5   Ankle dorsiflexion         Ankle plantarflexion         Ankle inversion         Ankle eversion          (Blank rows = not tested)  LUMBAR SPECIAL TESTS:  Straight leg raise test: Negative and Slump test: Negative  GAIT: Distance walked: 80 ft Assistive device utilized: None Comments: Improving gait but continues to exhibit forward flexed trunk and flexed hips   PATIENT EDUCATION:  Education details:  Person educated: Patient Education method:  Education comprehension:  HOME EXERCISE PROGRAM: Access Code: [email or web address] URL: [email or web address] Date: 06/10/2022 Prepared by: Gillermo Murdoch  Exercises - Prone Press Up On Elbows  - 2 x daily - 7 x weekly - 1 sets - 1 reps - 2-4 min  hold - Prone Gluteal Sets  - 2 x daily - 7 x weekly - 1 sets - 10 reps - 10 sec  hold - Prone Quadriceps Stretch with Strap  - 2 x daily - 7 x weekly - 1 sets - 3 reps - 30 sec  hold - Seated Hip Flexor Stretch  - 2 x daily - 7 x weekly - 1 sets - 3 reps - 30 sec  hold - Supine Bridge  - 1 x daily - 7 x weekly - 1-2 sets - 10 reps - 5-10 sec  hold - Supine Transversus Abdominis Bracing with Pelvic Floor Contraction  - 2  x daily - 7 x weekly - 1 sets - 10 reps - 10sec  hold - Supine Diaphragmatic Breathing  - 2 x daily - 7 x weekly - 1 sets - 10 reps - 4-6 sec  hold - Standing Hip Extension with Counter Support  - 2 x daily - 7 x weekly - 1-2 sets - 10 reps - 3-5 sec  hold - Standing Piriformis Release with Ball at [employer]  - 2 x daily - 7 x weekly - 30-60 sec  hold - Hooklying Isometric Clamshell  - 2 x daily - 7 x weekly - 1 sets - 10 reps - 3 sec  hold - Wall Quarter Squat  - 2 x daily - 7 x weekly - 1-2 sets - 10 reps - 5-10 sec  hold - Anti-Rotation Lateral Stepping with Press  - 2 x daily - 7 x weekly -  1-2 sets - 10 reps - 2-3 sec  hold - Drawing Bow  - 1 x daily - 7 x weekly - 1 sets - 10 reps - 3 sec  hold - Modified Deadlift with Pelvic Contraction  - 1 x daily - 7 x weekly - 1 sets - 10 reps - Deadlift with Resistance  - 1 x daily - 3-4 x weekly - 1 sets - 10 reps - Standing shoulder flexion wall slides  - 1 x daily - 7 x weekly - 1 sets - 5-10 reps - 10 sec  hold - Standing Hip Extension at Wall  - 1 x daily - 7 x weekly - 1 sets - 10 reps - 3 sec  hold  Aquatic : - TL Sidebending Stretch - Single Arm Overhead  - 1 x daily - 7 x weekly - 2 sets - 10-30 sec hold - Side Stepping  - 1 x daily - 3 x weekly - 3 sets - 10 reps - Backwards Walking  - 1 x daily - 3 x weekly - 3 sets - Walking Tandem Stance  - 1 x daily - 3 x weekly - 3 sets - Standing 3-Way Leg Reach  - 1 x daily - 3 x weekly - 1 sets - 10 reps - Split Stance Shoulder Row with Resistance  - 1 x daily - 3 x weekly - 2 sets - 10 reps - Single Leg Stance Clamshell  - 1 x daily - 3 x weekly - 1 sets - 10 reps - Warrior I in [employer] with [employer]  - 1 x daily - 3 x weekly - 1 sets - 5-10 reps - Sit to Stand  - 1 x daily - 7 x weekly - 1 sets - 10 reps - 3-5 sec  hold - Plank with Hip Extension at [employer]  - 1 x daily - 3 x weekly - 1 sets - 10 reps    ASSESSMENT:  CLINICAL IMPRESSION: [name] has persistent Lt sided LBP which is  present primarily with the first few steps upon initiating gait. He has palpable tightness in the Lt QL and lumbar paraspinals as well as hip flexors. Good release in muscular tightness noted with treatment and exercises. Patient is gradually progressing toward goals of therapy.    OBJECTIVE IMPAIRMENTS Abnormal gait, decreased activity tolerance, decreased balance, decreased endurance, decreased mobility, decreased ROM, decreased strength, hypomobility, increased fascial restrictions, impaired flexibility, improper body mechanics, postural dysfunction, obesity, and pain.   ACTIVITY LIMITATIONS carrying, lifting, bending, sitting, standing, squatting, transfers, and bed mobility  PARTICIPATION LIMITATIONS: community activity and yard work  [place], Past/current experiences, and 1-2 comorbidities: HTN; Rt THA; prediabetes  are also affecting patient's functional outcome.   REHAB POTENTIAL: Good  CLINICAL DECISION MAKING: Stable/uncomplicated  EVALUATION COMPLEXITY: Low   GOALS: Goals reviewed with patient? Yes   LONG TERM GOALS: Target date: 08/05/2022  Improve posture and alignment with patient to demonstrate neutral spinal and hip alignment in standing Baseline:  Goal status: ongoing  2.  Decrease back pain by 50-75% allowing patient to participate in normal functional activities with less pain and limitation  Baseline:  Goal status: on going  3.  Increase spinal extension to 10-20% range in standing  Baseline:  Goal status: on going  4.  Independent in HEP (including aquatic program) Baseline:  Goal status: on going  5.  Improve functional limitation score to 54 Baseline:  Goal status: on going  PLAN: PT FREQUENCY: 2x/wk  PT DURATION: 6 weeks  PLANNED INTERVENTIONS: Therapeutic exercises, Therapeutic activity, Neuromuscular re-education, Balance training, Gait training, Patient/Family education, Self Care, Joint mobilization, Aquatic Therapy,  Dry Needling, Electrical stimulation, Spinal mobilization, Cryotherapy, Moist heat, Taping, Manual therapy, and Re-evaluation.  PLAN FOR NEXT SESSION: review and progress with HEP; continue deep tissue work to [employer], lats, lumbar spine; progress with dynamic stabilization; continue education and home instruction; modalities as indicated. Note sent to MD    Everardo All, PT, MPH 07/01/22 8:27 AM [facility] [address] [place], [place], [address] Phone: [phone]   Fax:  [phone]

## 2022-07-03 ENCOUNTER — Other Ambulatory Visit: Payer: Self-pay | Admitting: Family Medicine

## 2022-07-07 ENCOUNTER — Ambulatory Visit (HOSPITAL_BASED_OUTPATIENT_CLINIC_OR_DEPARTMENT_OTHER): Payer: Medicare PPO | Admitting: Physical Therapy

## 2022-07-07 DIAGNOSIS — G4710 Hypersomnia, unspecified: Secondary | ICD-10-CM | POA: Diagnosis not present

## 2022-07-07 DIAGNOSIS — G4733 Obstructive sleep apnea (adult) (pediatric): Secondary | ICD-10-CM | POA: Diagnosis not present

## 2022-07-07 DIAGNOSIS — Z6841 Body Mass Index (BMI) 40.0 and over, adult: Secondary | ICD-10-CM | POA: Diagnosis not present

## 2022-07-08 ENCOUNTER — Ambulatory Visit: Payer: Medicare PPO | Admitting: Family Medicine

## 2022-07-08 ENCOUNTER — Encounter: Payer: Self-pay | Admitting: Family Medicine

## 2022-07-08 VITALS — BP 113/74 | HR 78 | Ht 70.0 in | Wt 291.0 lb

## 2022-07-08 DIAGNOSIS — M545 Low back pain, unspecified: Secondary | ICD-10-CM | POA: Diagnosis not present

## 2022-07-08 DIAGNOSIS — G8929 Other chronic pain: Secondary | ICD-10-CM

## 2022-07-08 DIAGNOSIS — M47816 Spondylosis without myelopathy or radiculopathy, lumbar region: Secondary | ICD-10-CM

## 2022-07-08 DIAGNOSIS — I10 Essential (primary) hypertension: Secondary | ICD-10-CM | POA: Diagnosis not present

## 2022-07-08 NOTE — Assessment & Plan Note (Signed)
Continues to have low back pain.  He does have a few more physical therapy sessions.  Has not noticed any lasting improvement with this.  We will go ahead and proceed with MRI for interventional planning.

## 2022-07-08 NOTE — Therapy (Signed)
OUTPATIENT PHYSICAL THERAPY TREATMENT/PROGRESS NOTE  Patient Name: John Morrow MRN: [id number] DOB:[birth year], 71 y.o., male Today's Date: 07/09/2022  Progress Note Reporting Period 04/15/2022 to 07/08/2022   See note below for Objective Data and Assessment of Progress/Goals.    PT End of Session - 07/09/22 0806     Visit Number 20    Number of Visits 28    Date for PT Re-Evaluation 08/05/22    Authorization Type [employer]    Authorization Time Period 06/24/22-08/05/22    Authorization - Visit Number 4    Authorization - Number of Visits 12    Progress Note Due on Visit 64    PT Start Time 0807    PT Stop Time 0848    PT Time Calculation (min) 41 min    Activity Tolerance Patient tolerated treatment well    Behavior During Therapy [facility] for tasks assessed/performed                  Past Medical History:  Diagnosis Date   Arthritis    Back pain    Diverticulosis of colon without hemorrhage 06/12/2016   On Colonoscopy 11/06/2010   HLD (hyperlipidemia) 05/29/2016   Hyperlipemia    Hypertension    Joint pain    Morbid obesity ([place]) 05/29/2016   Prediabetes    Right hip pain    needs hip replacement   Sleep apnea    on CPAP   Past Surgical History:  Procedure Laterality Date   EYE SURGERY     age 51   KNEE SURGERY     REPLACEMENT TOTAL HIP W/  RESURFACING IMPLANTS Right 86/1683   Dr. Aleda Grana   Patient Active Problem List   Diagnosis Date Noted   Acute pancreatitis 04/07/2022   OSA (obstructive sleep apnea) 02/19/2022   Gout 12/24/2021   Chronic rhinitis 08/21/2021   Impotence of organic origin 02/13/2021   Lumbar spondylosis 10/09/2020   Pre-operative exam 07/18/2020   Ver[place]ca 05/01/2020   Epiploic appendagitis 02/20/2020   Bilateral shoulder region arthritis 04/13/2019   Vitamin D deficiency 02/08/2019   Insulin resistance 02/08/2019   Class 2 severe obesity with serious comorbidity and body mass index (BMI) of 39.0 to 39.9 in adult  ([place]) 02/08/2019   Prediabetes 02/08/2019   Chronic pain 09/28/2018   Hip pain 06/28/2018   Dependent edema 05/05/2017   Primary osteoarthritis of left knee 09/25/2016   BPH (benign prostatic hyperplasia) 05/29/2016   Urine frequency 05/29/2016   HTN (hypertension) 05/29/2016   HLD (hyperlipidemia) 05/29/2016   Class 2 obesity due to excess calories without serious comorbidity with body mass index (BMI) of 39.0 to 39.9 in adult 05/29/2016   Sleep apnea 03/31/2014    PCP: Dr Luetta Nutting  REFERRING PROVIDER: Dr Luetta Nutting  REFERRING DIAG: Acute LBP  Rationale for Evaluation and Treatment Rehabilitation  THERAPY DIAG:  Other low back pain  Other symptoms and signs involving the musculoskeletal system  ONSET DATE: 04/07/22; history of LBP for ~ 5 years   SUBJECTIVE:  SUBJECTIVE STATEMENT: Pt arrives with report of increased hip pain this AM. Pt states that he was doing quite well with PT but notes increased QL pain over past 4-5 weeks. Reports fair relief after sessions but states that it doesn't tend to persist. Overall pt states that he feels he has had minimal improvement.   PERTINENT HISTORY:  HTN; hyperlipemia; Rt THA; obesity; back pain; prediabetes; sleep apnea;   PAIN:  07/09/22  Are you having pain? yes: NPRS scale: 6-7/10  Pain location:  Lt low back/ Lt hip Pain description: tight  Aggravating factors: first thing in morning; prolonged standing; walking; steps; lifting Relieving factors: deep tissue release; sitting; not moving  PRECAUTIONS: None  PATIENT GOALS   to be pain free, or comfortable    OBJECTIVE:   TODAY'S TREATMENT:   [facility] Adult PT Treatment:                                DATE: 07/09/22 Therapeutic Exercise: Nu step level 5 8 min Seated/sidelying QL  stretch over pillow and bolsters 3x30sec  Standing hip extensionsx10 each LE, UE support, cues for reduced trunk lean Significant time spent with progress note activities and pt discussion/education   06/30/22: There Ex: Nu-step L7 x 10 minutes LE/UEs (UE 10) Standing: Shoulder flexion facing wall x 10 reps x 2 sets Modified deadlift 20# KB from 8" stool x 15 Hip extension facing wall then counter x 10 x 2  Bow and arrow black TB x 10 reps x 2 sets Standing back at wall for glut set/hip ext 2 min Working on standing posture/alignment w/hip extension Sitting lateral trunk flexion over bolster and pillows 30-45 sec x 3 reps   Manual: STM Lt QL  pt sidelying  - deep tissue release work using elbow for deep pressure as pt tolerates      Aquatic Therapy  06/11/22: Pt seen for aquatic therapy today.  Treatment took place in water 3.25-4.75 ft in depth at the [facility]. Temp of water was 92.  Pt entered/exited the pool via stairs independently with bilat rail. * Forward / backward walking;  side stepping  * Holding rainbow hand buoys- tandem gait forward / backward *Holding yellow hand buoys:  side stepping with arm / leg abdct/ add R/L; side step squat with arm abdct/add (challenge for coordination) ; walking forward lunges/ split squats  * holding wall:  single leg clam x 10; quick hip abdct/add x 10 each with core engaged; hip ext x 5 each * Plank with hip ext (hands on bench in water) *  hip flexor stretch x 20s x 2 - cues for posture and form * staggered stance kick board row - playing with stance distance and speed * STS from 4th step without UE support and 5 count descent  * Warrior 1, lifting noodle off of water towards ceiling, in staggered stance x 5 each side (improved) *forward/backward walking with yellow hand buoys at sides under water for core engagement.  From initial evaluation: PATIENT SURVEYS:  FOTO 44 FOTO 07/09/22: 49  MUSCLE  LENGTH: Hamstrings: tight Rt ~ 60 deg; Lt 65 deg Hip flexors tight Rt > Lt unable to tolerate Thomas test  POSTURE: rounded shoulders, forward head, decreased lumbar lordosis, increased thoracic kyphosis, posterior pelvic tilt, and flexed trunk   PALPATION: Muscular tightness Lt QL, lats, lumbar paraspinals, iliopsoas  LUMBAR ROM:   Active  A/PROM  eval AROM  05/27/22 AROM 06/30/22 AROM 07/09/22  Flexion 45% 70% 70% 90% (able to touch toes although compensates with knee bend)  Extension -10% 5% 10% 20%  Right lateral flexion 35% 65% 70% 70%  Left lateral flexion 30% 55% 60% 70%  Right rotation 15% 30% 40% 75%  Left rotation 10% 25% 40% 75%   (Blank rows = not tested)  LOWER EXTREMITY ROM:     Active  Right eval Right 05/27/22 Left eval Left 05/27/22  Hip flexion tight Tight end range tight Tight end range  Hip extension -10 0 -10 0  Hip abduction tight tight tight Tight   Hip adduction      Hip internal rotation      Hip external rotation      Knee flexion 90  90   Knee extension WNL's  WNL's   Ankle dorsiflexion      Ankle plantarflexion      Ankle inversion      Ankle eversion       (Blank rows = not tested)  LOWER EXTREMITY MMT:    MMT Right eval  Right 06/30/22  Left eval  Left 06/30/22  Right/Left 07/09/22  Hip flexion 4/5 4+/5 5/5 4/5 4+/5 5/5 5/5  Hip extension 4-/5 4/5 4+/5 4/4 4/5 4+/5   Hip abduction 4-/5 4+/5 4+/5 4-/5 4/5 4/5   Hip adduction         Hip internal rotation         Hip external rotation         Knee flexion 5/5 5/5  5/5 5/5    Knee extension 5/5 5/5  5/5 5/5    Ankle dorsiflexion         Ankle plantarflexion         Ankle inversion         Ankle eversion          (Blank rows = not tested)  LUMBAR SPECIAL TESTS:  Straight leg raise test: Negative and Slump test: Negative  GAIT: Distance walked: 80 ft Assistive device utilized: None Comments: Improving gait but continues to exhibit forward flexed trunk and flexed hips    PATIENT EDUCATION:  Education details: significant time spent with pt education re: symptom behavior, progress with PT, pain physiology, pt goals/progress Person educated: Patient Education method: verbal, visual Education comprehension:verbalized understanding  HOME EXERCISE PROGRAM: Access Code: [email or web address] URL: [email or web address] Date: 06/10/2022 Prepared by: Gillermo Murdoch  Exercises - Prone Press Up On Elbows  - 2 x daily - 7 x weekly - 1 sets - 1 reps - 2-4 min  hold - Prone Gluteal Sets  - 2 x daily - 7 x weekly - 1 sets - 10 reps - 10 sec  hold - Prone Quadriceps Stretch with Strap  - 2 x daily - 7 x weekly - 1 sets - 3 reps - 30 sec  hold - Seated Hip Flexor Stretch  - 2 x daily - 7 x weekly - 1 sets - 3 reps - 30 sec  hold - Supine Bridge  - 1 x daily - 7 x weekly - 1-2 sets - 10 reps - 5-10 sec  hold - Supine Transversus Abdominis Bracing with Pelvic Floor Contraction  - 2 x daily - 7 x weekly - 1 sets - 10 reps - 10sec  hold - Supine Diaphragmatic Breathing  - 2 x daily - 7 x weekly - 1 sets - 10 reps - 4-6 sec  hold - Standing Hip Extension with Counter  Support  - 2 x daily - 7 x weekly - 1-2 sets - 10 reps - 3-5 sec  hold - Standing Piriformis Release with Ball at [employer]  - 2 x daily - 7 x weekly - 30-60 sec  hold - Hooklying Isometric Clamshell  - 2 x daily - 7 x weekly - 1 sets - 10 reps - 3 sec  hold - Wall Quarter Squat  - 2 x daily - 7 x weekly - 1-2 sets - 10 reps - 5-10 sec  hold - Anti-Rotation Lateral Stepping with Press  - 2 x daily - 7 x weekly - 1-2 sets - 10 reps - 2-3 sec  hold - Drawing Bow  - 1 x daily - 7 x weekly - 1 sets - 10 reps - 3 sec  hold - Modified Deadlift with Pelvic Contraction  - 1 x daily - 7 x weekly - 1 sets - 10 reps - Deadlift with Resistance  - 1 x daily - 3-4 x weekly - 1 sets - 10 reps - Standing shoulder flexion wall slides  - 1 x daily - 7 x weekly - 1 sets - 5-10 reps - 10 sec  hold - Standing Hip Extension at Wall  - 1 x  daily - 7 x weekly - 1 sets - 10 reps - 3 sec  hold  Aquatic : - TL Sidebending Stretch - Single Arm Overhead  - 1 x daily - 7 x weekly - 2 sets - 10-30 sec hold - Side Stepping  - 1 x daily - 3 x weekly - 3 sets - 10 reps - Backwards Walking  - 1 x daily - 3 x weekly - 3 sets - Walking Tandem Stance  - 1 x daily - 3 x weekly - 3 sets - Standing 3-Way Leg Reach  - 1 x daily - 3 x weekly - 1 sets - 10 reps - Split Stance Shoulder Row with Resistance  - 1 x daily - 3 x weekly - 2 sets - 10 reps - Single Leg Stance Clamshell  - 1 x daily - 3 x weekly - 1 sets - 10 reps - Warrior I in [employer] with [employer]  - 1 x daily - 3 x weekly - 1 sets - 5-10 reps - Sit to Stand  - 1 x daily - 7 x weekly - 1 sets - 10 reps - 3-5 sec  hold - Plank with Hip Extension at [employer]  - 1 x daily - 3 x weekly - 1 sets - 10 reps    ASSESSMENT:  CLINICAL IMPRESSION: Pt arrives with left hip/low back pain, continues to have worst pain with initial steps. Pt reports minimal improvement overall since beginning PT - initial improvement reported but he states has had significant setback over past few weeks with QL pain. Pt continues to have improvements in MSK impairments despite report of continued pain, see objective section above. Pt denies any change in symptoms with today's session, significant time spent discussing progress note activities and pt perception of progress thus far. Pt departs today's session in no acute distress, all voiced questions/concerns addressed appropriately from PT perspective.      OBJECTIVE IMPAIRMENTS Abnormal gait, decreased activity tolerance, decreased balance, decreased endurance, decreased mobility, decreased ROM, decreased strength, hypomobility, increased fascial restrictions, impaired flexibility, improper body mechanics, postural dysfunction, obesity, and pain.   ACTIVITY LIMITATIONS carrying, lifting, bending, sitting, standing, squatting, transfers, and bed  mobility  PARTICIPATION LIMITATIONS:  community activity and yard work  PERSONAL Education officer, community, Past/current experiences, and 1-2 comorbidities: HTN; Rt THA; prediabetes  are also affecting patient's functional outcome.   REHAB POTENTIAL: Good  CLINICAL DECISION MAKING: Stable/uncomplicated  EVALUATION COMPLEXITY: Low   GOALS: Goals reviewed with patient? Yes   LONG TERM GOALS: Target date: 08/05/2022  Improve posture and alignment with patient to demonstrate neutral spinal and hip alignment in standing Baseline:  07/09/22: guarded posture, increased lordosis Goal status: ongoing  2.  Decrease back pain by 50-75% allowing patient to participate in normal functional activities with less pain and limitation  Baseline:  07/09/22: pt reports minimal improvement overall since onset of QL pain Goal status: on going  3.  Increase spinal extension to 10-20% range in standing  Baseline:  07/09/22: 25% Goal status: MET  4.  Independent in HEP (including aquatic program) Baseline:  Goal status: on going  5.  Improve functional limitation score to 54 Baseline:  07/09/22: 49 Goal status: on going     PLAN: PT FREQUENCY: 2x/wk  PT DURATION: 6 weeks  PLANNED INTERVENTIONS: Therapeutic exercises, Therapeutic activity, Neuromuscular re-education, Balance training, Gait training, Patient/Family education, Self Care, Joint mobilization, Aquatic Therapy, Dry Needling, Electrical stimulation, Spinal mobilization, Cryotherapy, Moist heat, Taping, Manual therapy, and Re-evaluation.  PLAN FOR NEXT SESSION: continue along current POC  Leeroy Cha PT, DPT 07/09/2022 12:01 PM

## 2022-07-08 NOTE — Progress Notes (Signed)
John Morrow - 71 y.o. male MRN [id number]  Date of birth: 1950/11/09  Subjective Chief Complaint  Patient presents with   Hypertension    HPI John Morrow is a 71 y.o. male here today for follow up visit.   Previously on on GLP-1 for management of prediabetes. Unfortunately he developed pancreatitis.  He has recovered well from this.  He continues to have pain in his lower back area.  He is seeing physical therapy.  He has continued this for about 5 weeks.  He does complete stretches but only gets temporary relief.  They are focusing on his quadratus lumborum.  Dry needling was not effective.  Pain is worse when first standing.  Currently using tramadol and meloxicam as well as muscle relaxer as needed.  Blood pressure has been well controlled with lisinopril/hydrochlorothiazide.  No side effects at current strength.  Denies chest pain, shortness of breath, palpitations, headaches or vision changes.  ROS:  A comprehensive ROS was completed and negative except as noted per HPI     Allergies  Allergen Reactions   Morphine And Related     Rash, vomiting, itching     Past Medical History:  Diagnosis Date   Arthritis    Back pain    Diverticulosis of colon without hemorrhage 06/12/2016   On Colonoscopy 11/06/2010   HLD (hyperlipidemia) 05/29/2016   Hyperlipemia    Hypertension    Joint pain    Morbid obesity ([place]) 05/29/2016   Prediabetes    Right hip pain    needs hip replacement   Sleep apnea    on CPAP    Past Surgical History:  Procedure Laterality Date   EYE SURGERY     age 74   KNEE SURGERY     REPLACEMENT TOTAL HIP W/  RESURFACING IMPLANTS Right 59/5638   Dr. Aleda Grana    Social History   Socioeconomic History   Marital status: Married    Spouse name: [name]   Number of children: 4   Years of education: 18   Highest education level: Occupational hygienist History   Occupation: retired    Comment: Pharmacist, hospital  Tobacco Use   Smoking status: Never    Smokeless tobacco: Never  Scientific laboratory technician Use: Never used  Substance and Sexual Activity   Alcohol use: Not Currently    Comment: rarely   Drug use: No   Sexual activity: Yes    Partners: Female  Other Topics Concern   Not on file  Social History Narrative   Lives with his wife. 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   Social Determinants of Health   Financial Resource Strain: Low Risk  (09/23/2021)   Overall Financial Resource Strain (CARDIA)    Difficulty of Paying Living Expenses: Not hard at all  Food Insecurity: No Food Insecurity (09/23/2021)   Hunger Vital Sign    Worried About Running Out of Food in the Last Year: Never true    Ran Out of Food in the Last Year: Never true  Transportation Needs: No Transportation Needs (09/23/2021)   PRAPARE - Hydrologist (Medical): No    Lack of Transportation (Non-Medical): No  Physical Activity: Inactive (09/23/2021)   Exercise Vital Sign    Days of Exercise per Week: 0 days    Minutes of Exercise per Session: 0 min  Stress: No Stress Concern Present (09/23/2021)   [place]  Stress Questionnaire    Feeling of Stress : Not at all  Social Connections: Moderately Integrated (09/23/2021)   Social Connection and Isolation Panel [NHANES]    Frequency of Communication with Friends and Family: More than three times a week    Frequency of Social Gatherings with Friends and Family: Once a week    Attends Religious Services: More than 4 times per year    Active Member of [employer] or Organizations: No    Attends Archivist Meetings: Never    Marital Status: Married    Family History  Problem Relation Age of Onset   Lung disease Mother    Hypertension Mother    Lung disease Father    Alcoholism Father    Colon cancer Neg Hx    Colon polyps Neg Hx    Esophageal cancer Neg Hx    Stomach cancer Neg Hx     Rectal cancer Neg Hx     Health Maintenance  Topic Date Due   COVID-19 Vaccine (3 - Pfizer series) 10/16/2022 (Originally 03/12/2020)   Medicare Annual Wellness (AWV)  10/23/2022   TETANUS/TDAP  05/29/2026   Pneumonia Vaccine 56+ Years old  Completed   INFLUENZA VACCINE  Completed   Hepatitis C Screening  Completed   Zoster Vaccines- Shingrix  Completed   HPV VACCINES  Aged Out   COLONOSCOPY (Pts 45-69yr Insurance coverage will need to be confirmed)  Discontinued     ----------------------------------------------------------------------------------------------------------------------------------------------------------------------------------------------------------------- Physical Exam BP 113/74 (BP Location: Left Arm, Patient Position: Sitting, Cuff Size: Large)   Pulse 78   Ht '5\' 10"'$  (1.778 m)   Wt 291 lb (132 kg)   SpO2 95%   BMI 41.75 kg/m   Physical Exam Constitutional:      Appearance: Normal appearance.  Eyes:     General: No scleral icterus. Cardiovascular:     Rate and Rhythm: Normal rate and regular rhythm.  Pulmonary:     Effort: Pulmonary effort is normal.     Breath sounds: Normal breath sounds.  Musculoskeletal:     Cervical back: Neck supple.  Neurological:     Mental Status: He is alert.  Psychiatric:        Mood and Affect: Mood normal.        Behavior: Behavior normal.     ------------------------------------------------------------------------------------------------------------------------------------------------------------------------------------------------------------------- Assessment and Plan  Lumbar spondylosis Continues to have low back pain.  He does have a few more physical therapy sessions.  Has not noticed any lasting improvement with this.  We will go ahead and proceed with MRI for interventional planning.  HTN (hypertension) Blood pressure is well controlled at this time.  Recommend continuation of current medication for  management of hypertension.   No orders of the defined types were placed in this encounter.   Return in about 6 months (around 01/07/2023) for HTN/Prediabetes/Fasting labs.    This visit occurred during the SARS-CoV-2 public health emergency.  Safety protocols were in place, including screening questions prior to the visit, additional usage of staff PPE, and extensive cleaning of exam room while observing appropriate contact time as indicated for disinfecting solutions.

## 2022-07-08 NOTE — Assessment & Plan Note (Signed)
Blood pressure is well-controlled at this time.  Recommend continuation of current medication for management of hypertension. 

## 2022-07-09 ENCOUNTER — Encounter: Payer: Self-pay | Admitting: Physical Therapy

## 2022-07-09 ENCOUNTER — Ambulatory Visit: Payer: Medicare PPO | Admitting: Physical Therapy

## 2022-07-09 DIAGNOSIS — R29898 Other symptoms and signs involving the musculoskeletal system: Secondary | ICD-10-CM | POA: Diagnosis not present

## 2022-07-09 DIAGNOSIS — M5459 Other low back pain: Secondary | ICD-10-CM

## 2022-07-13 ENCOUNTER — Ambulatory Visit (INDEPENDENT_AMBULATORY_CARE_PROVIDER_SITE_OTHER): Payer: Medicare PPO

## 2022-07-13 ENCOUNTER — Encounter (HOSPITAL_BASED_OUTPATIENT_CLINIC_OR_DEPARTMENT_OTHER): Payer: Self-pay | Admitting: Physical Therapy

## 2022-07-13 DIAGNOSIS — G8929 Other chronic pain: Secondary | ICD-10-CM | POA: Diagnosis not present

## 2022-07-13 DIAGNOSIS — M545 Low back pain, unspecified: Secondary | ICD-10-CM

## 2022-07-13 DIAGNOSIS — M5136 Other intervertebral disc degeneration, lumbar region: Secondary | ICD-10-CM | POA: Diagnosis not present

## 2022-07-14 ENCOUNTER — Ambulatory Visit (HOSPITAL_BASED_OUTPATIENT_CLINIC_OR_DEPARTMENT_OTHER): Payer: Medicare PPO | Admitting: Physical Therapy

## 2022-07-14 ENCOUNTER — Ambulatory Visit: Payer: Medicare PPO | Admitting: Rehabilitative and Restorative Service Providers"

## 2022-07-14 ENCOUNTER — Encounter: Payer: Self-pay | Admitting: Rehabilitative and Restorative Service Providers"

## 2022-07-14 DIAGNOSIS — M5459 Other low back pain: Secondary | ICD-10-CM | POA: Diagnosis not present

## 2022-07-14 DIAGNOSIS — R29898 Other symptoms and signs involving the musculoskeletal system: Secondary | ICD-10-CM | POA: Diagnosis not present

## 2022-07-14 NOTE — Therapy (Addendum)
OUTPATIENT PHYSICAL THERAPY TREATMENT/PROGRESS NOTE AND DISCHARGE STATUS PHYSICAL THERAPY DISCHARGE SUMMARY  Visits from Start of Care: 21  Current functional level related to goals / functional outcomes: SEE PROGRESS NOTE FOR DISCHARGE STATUS   Remaining deficits: Unknown    Education / Equipment: HEP    Patient agrees to discharge. Patient goals were partially met. Patient is being discharged due to  return to MD for further evaluation.  Amayah Staheli P. Helene Kelp PT, MPH 09/03/22 8:32 AM  Patient Name: John Morrow MRN: [id number] DOB:1951/05/20, 71 y.o., male Today's Date: 07/14/2022  Progress Note Reporting Period 04/15/2022 to 07/08/2022   See note below for Objective Data and Assessment of Progress/Goals.    PT End of Session - 07/14/22 0852     Visit Number 21    Number of Visits 28    Date for PT Re-Evaluation 08/05/22    Authorization Type [employer]    Authorization Time Period 06/24/22-08/05/22    Authorization - Visit Number 5    Authorization - Number of Visits 12    Progress Note Due on Visit 8    PT Start Time 0845    PT Stop Time 0930    PT Time Calculation (min) 45 min                  Past Medical History:  Diagnosis Date   Arthritis    Back pain    Diverticulosis of colon without hemorrhage 06/12/2016   On Colonoscopy 11/06/2010   HLD (hyperlipidemia) 05/29/2016   Hyperlipemia    Hypertension    Joint pain    Morbid obesity ([place]) 05/29/2016   Prediabetes    Right hip pain    needs hip replacement   Sleep apnea    on CPAP   Past Surgical History:  Procedure Laterality Date   EYE SURGERY     age 71   KNEE SURGERY     REPLACEMENT TOTAL HIP W/  RESURFACING IMPLANTS Right 72/3845   Dr. Aleda Grana   Patient Active Problem List   Diagnosis Date Noted   Acute pancreatitis 04/07/2022   OSA (obstructive sleep apnea) 02/19/2022   Gout 12/24/2021   Chronic rhinitis 08/21/2021   Impotence of organic origin 02/13/2021   Lumbar  spondylosis 10/09/2020   Pre-operative exam 07/18/2020   Verruca 05/01/2020   Epiploic appendagitis 02/20/2020   Bilateral shoulder region arthritis 04/13/2019   Vitamin D deficiency 02/08/2019   Insulin resistance 02/08/2019   Class 2 severe obesity with serious comorbidity and body mass index (BMI) of 39.0 to 39.9 in adult ([place]) 02/08/2019   Prediabetes 02/08/2019   Chronic pain 09/28/2018   Hip pain 06/28/2018   Dependent edema 05/05/2017   Primary osteoarthritis of left knee 09/25/2016   BPH (benign prostatic hyperplasia) 05/29/2016   Urine frequency 05/29/2016   HTN (hypertension) 05/29/2016   HLD (hyperlipidemia) 05/29/2016   Class 2 obesity due to excess calories without serious comorbidity with body mass index (BMI) of 39.0 to 39.9 in adult 05/29/2016   Sleep apnea 03/31/2014    PCP: Dr Luetta Nutting  REFERRING PROVIDER: Dr Luetta Nutting  REFERRING DIAG: Acute LBP  Rationale for Evaluation and Treatment Rehabilitation  THERAPY DIAG:  Other low back pain  Other symptoms and signs involving the musculoskeletal system  ONSET DATE: 04/07/22; history of LBP for ~ 5 years   SUBJECTIVE:  SUBJECTIVE STATEMENT: Increased pain in the past couple weeks. Not sure what irritated symptoms. Had MD appointment last week and had an MRI yesterday.    PERTINENT HISTORY:  HTN; hyperlipemia; Rt THA; obesity; back pain; prediabetes; sleep apnea;   PAIN:  07/14/22  Are you having pain? yes: NPRS scale: 8/10  Pain location:  Lt low back/ Lt hip Pain description: tight  Aggravating factors: first thing in morning; prolonged standing; walking; steps; lifting Relieving factors: deep tissue release; sitting; not moving  PRECAUTIONS: None  PATIENT GOALS   to be pain free, or comfortable     OBJECTIVE:   TODAY'S TREATMENT:  [facility] Adult PT Treatment:                                DATE: 07/14/22 Therapeutic Exercise: Nu step level 5 x 12 min Seated/sidelying QL stretch over pillow and bolsters 3x30sec  Standing hip extensionsx10 each LE, UE support, cues for reduced trunk lean Significant time spent with progress note activities and pt discussion/education  Manual: STM Lt QL  pt sidelying  - deep tissue release work using elbow for deep pressure as pt tolerates deep pressure with extension of lt Le in sidelying   [facility] Adult PT Treatment:                                DATE: 07/09/22 Therapeutic Exercise: Nu step level 5 8 min Seated/sidelying QL stretch over pillow and bolsters 3x30sec  Added hip depression in sidelying 10-20 sec hold x 5 reps    Manual work:    06/30/22: There Ex: Nu-step L7 x 10 minutes LE/UEs (UE 10) Standing: Shoulder flexion facing wall x 10 reps x 2 sets Modified deadlift 20# KB from 8" stool x 15 Hip extension facing wall then counter x 10 x 2  Bow and arrow black TB x 10 reps x 2 sets Standing back at wall for glut set/hip ext 2 min Working on standing posture/alignment w/hip extension Sitting lateral trunk flexion over bolster and pillows 30-45 sec x 3 reps   Manual: STM Lt QL  pt sidelying  - deep tissue release work using elbow for deep pressure as pt tolerates      Aquatic Therapy  06/11/22: Pt seen for aquatic therapy today.  Treatment took place in water 3.25-4.75 ft in depth at the [facility]. Temp of water was 92.  Pt entered/exited the pool via stairs independently with bilat rail. * Forward / backward walking;  side stepping  * Holding rainbow hand buoys- tandem gait forward / backward *Holding yellow hand buoys:  side stepping with arm / leg abdct/ add R/L; side step squat with arm abdct/add (challenge for coordination) ; walking forward lunges/ split squats  * holding wall:  single leg clam x 10; quick  hip abdct/add x 10 each with core engaged; hip ext x 5 each * Plank with hip ext (hands on bench in water) *  hip flexor stretch x 20s x 2 - cues for posture and form * staggered stance kick board row - playing with stance distance and speed * STS from 4th step without UE support and 5 count descent  * Warrior 1, lifting noodle off of water towards ceiling, in staggered stance x 5 each side (improved) *forward/backward walking with yellow hand buoys at sides under water for core engagement.  From initial evaluation: PATIENT SURVEYS:  FOTO 44 FOTO 07/09/22: 49  MUSCLE LENGTH: Hamstrings: tight Rt ~ 60 deg; Lt 65 deg Hip flexors tight Rt > Lt unable to tolerate [employer] test  POSTURE: rounded shoulders, forward head, decreased lumbar lordosis, increased thoracic kyphosis, posterior pelvic tilt, and flexed trunk   PALPATION: Muscular tightness Lt QL, lats, lumbar paraspinals, iliopsoas  LUMBAR ROM:   07/14/22: significant decrease in AROM lumbar spine - unable to assess due to pain  Active  A/PROM  eval AROM 05/27/22 AROM 06/30/22 AROM 07/09/22  Flexion 45% 70% 70% 90% (able to touch toes although compensates with knee bend)  Extension -10% 5% 10% 20%  Right lateral flexion 35% 65% 70% 70%  Left lateral flexion 30% 55% 60% 70%  Right rotation 15% 30% 40% 75%  Left rotation 10% 25% 40% 75%   (Blank rows = not tested)  LOWER EXTREMITY ROM:     Active  Right eval Right 05/27/22 Left eval Left 05/27/22  Hip flexion tight Tight end range tight Tight end range  Hip extension -10 0 -10 0  Hip abduction tight tight tight Tight   Hip adduction      Hip internal rotation      Hip external rotation      Knee flexion 90  90   Knee extension WNL's  WNL's   Ankle dorsiflexion      Ankle plantarflexion      Ankle inversion      Ankle eversion       (Blank rows = not tested)  LOWER EXTREMITY MMT:    MMT Right eval  Right 06/30/22  Left eval  Left 06/30/22  Right/Left 07/09/22   Hip flexion 4/5 4+/5 5/5 4/5 4+/5 5/5 5/5  Hip extension 4-/5 4/5 4+/5 4/4 4/5 4+/5   Hip abduction 4-/5 4+/5 4+/5 4-/5 4/5 4/5   Hip adduction         Hip internal rotation         Hip external rotation         Knee flexion 5/5 5/5  5/5 5/5    Knee extension 5/5 5/5  5/5 5/5    Ankle dorsiflexion         Ankle plantarflexion         Ankle inversion         Ankle eversion          (Blank rows = not tested)  LUMBAR SPECIAL TESTS:  Straight leg raise test: Negative and Slump test: Negative  GAIT: Distance walked: 80 ft Assistive device utilized: None Comments: Improving gait but continues to exhibit forward flexed trunk and flexed hips   PATIENT EDUCATION:  Education details: significant time spent with pt education re: symptom behavior, progress with PT, pain physiology, pt goals/progress Person educated: Patient Education method: verbal, visual Education comprehension:verbalized understanding  HOME EXERCISE PROGRAM: Access Code: [email or web address] URL: [email or web address] Date: 06/10/2022 Prepared by: Gillermo Murdoch  Exercises - Prone Press Up On Elbows  - 2 x daily - 7 x weekly - 1 sets - 1 reps - 2-4 min  hold - Prone Gluteal Sets  - 2 x daily - 7 x weekly - 1 sets - 10 reps - 10 sec  hold - Prone Quadriceps Stretch with Strap  - 2 x daily - 7 x weekly - 1 sets - 3 reps - 30 sec  hold - Seated Hip Flexor Stretch  - 2 x daily - 7 x weekly - 1 sets -  3 reps - 30 sec  hold - Supine Bridge  - 1 x daily - 7 x weekly - 1-2 sets - 10 reps - 5-10 sec  hold - Supine Transversus Abdominis Bracing with Pelvic Floor Contraction  - 2 x daily - 7 x weekly - 1 sets - 10 reps - 10sec  hold - Supine Diaphragmatic Breathing  - 2 x daily - 7 x weekly - 1 sets - 10 reps - 4-6 sec  hold - Standing Hip Extension with Counter Support  - 2 x daily - 7 x weekly - 1-2 sets - 10 reps - 3-5 sec  hold - Standing Piriformis Release with Ball at [employer]  - 2 x daily - 7 x weekly - 30-60 sec  hold -  Hooklying Isometric Clamshell  - 2 x daily - 7 x weekly - 1 sets - 10 reps - 3 sec  hold - Wall Quarter Squat  - 2 x daily - 7 x weekly - 1-2 sets - 10 reps - 5-10 sec  hold - Anti-Rotation Lateral Stepping with Press  - 2 x daily - 7 x weekly - 1-2 sets - 10 reps - 2-3 sec  hold - Drawing Bow  - 1 x daily - 7 x weekly - 1 sets - 10 reps - 3 sec  hold - Modified Deadlift with Pelvic Contraction  - 1 x daily - 7 x weekly - 1 sets - 10 reps - Deadlift with Resistance  - 1 x daily - 3-4 x weekly - 1 sets - 10 reps - Standing shoulder flexion wall slides  - 1 x daily - 7 x weekly - 1 sets - 5-10 reps - 10 sec  hold - Standing Hip Extension at Wall  - 1 x daily - 7 x weekly - 1 sets - 10 reps - 3 sec  hold  Aquatic : - TL Sidebending Stretch - Single Arm Overhead  - 1 x daily - 7 x weekly - 2 sets - 10-30 sec hold - Side Stepping  - 1 x daily - 3 x weekly - 3 sets - 10 reps - Backwards Walking  - 1 x daily - 3 x weekly - 3 sets - Walking Tandem Stance  - 1 x daily - 3 x weekly - 3 sets - Standing 3-Way Leg Reach  - 1 x daily - 3 x weekly - 1 sets - 10 reps - Split Stance Shoulder Row with Resistance  - 1 x daily - 3 x weekly - 2 sets - 10 reps - Single Leg Stance Clamshell  - 1 x daily - 3 x weekly - 1 sets - 10 reps - Warrior I in [employer] with [employer]  - 1 x daily - 3 x weekly - 1 sets - 5-10 reps - Sit to Stand  - 1 x daily - 7 x weekly - 1 sets - 10 reps - 3-5 sec  hold - Plank with Hip Extension at [employer]  - 1 x daily - 3 x weekly - 1 sets - 10 reps    ASSESSMENT:  CLINICAL IMPRESSION: 07/14/22:Significant increase in left hip/low back pain, which is worse with initial steps when he begins to walk but now having increased pain with any movement. Patient demonstrated and reported good improvement initially but he has had significant increase in pain in weeks with QL pain.  Awaiting results of MRI     OBJECTIVE IMPAIRMENTS Abnormal gait, decreased activity tolerance, decreased  balance, decreased endurance, decreased mobility, decreased ROM, decreased strength, hypomobility, increased fascial restrictions, impaired flexibility, improper body mechanics, postural dysfunction, obesity, and pain.   ACTIVITY LIMITATIONS carrying, lifting, bending, sitting, standing, squatting, transfers, and bed mobility  PARTICIPATION LIMITATIONS: community activity and yard work  [place], Past/current experiences, and 1-2 comorbidities: HTN; Rt THA; prediabetes  are also affecting patient's functional outcome.   REHAB POTENTIAL: Good  CLINICAL DECISION MAKING: Stable/uncomplicated  EVALUATION COMPLEXITY: Low   GOALS: Goals reviewed with patient? Yes   LONG TERM GOALS: Target date: 08/05/2022  Improve posture and alignment with patient to demonstrate neutral spinal and hip alignment in standing Baseline:  07/09/22: guarded posture, increased lordosis Goal status: ongoing  2.  Decrease back pain by 50-75% allowing patient to participate in normal functional activities with less pain and limitation  Baseline:  07/09/22: pt reports minimal improvement overall since onset of QL pain Goal status: on going  3.  Increase spinal extension to 10-20% range in standing  Baseline:  07/09/22: 25% Goal status: MET  4.  Independent in HEP (including aquatic program) Baseline:  Goal status: on going  5.  Improve functional limitation score to 54 Baseline:  07/09/22: 49 Goal status: on going     PLAN: PT FREQUENCY: 2x/wk  PT DURATION: 6 weeks  PLANNED INTERVENTIONS: Therapeutic exercises, Therapeutic activity, Neuromuscular re-education, Balance training, Gait training, Patient/Family education, Self Care, Joint mobilization, Aquatic Therapy, Dry Needling, Electrical stimulation, Spinal mobilization, Cryotherapy, Moist heat, Taping, Manual therapy, and Re-evaluation.  PLAN FOR NEXT SESSION: hold PT unit RTD and MRI results   Alieu Finnigan P. Helene Kelp PT,  MPH 07/14/22 8:53 AM

## 2022-07-16 ENCOUNTER — Ambulatory Visit: Payer: Medicare PPO | Admitting: Family Medicine

## 2022-07-16 ENCOUNTER — Encounter: Payer: Self-pay | Admitting: Family Medicine

## 2022-07-16 ENCOUNTER — Telehealth: Payer: Medicare PPO | Admitting: Family Medicine

## 2022-07-16 VITALS — BP 135/81 | HR 70 | Ht 70.0 in | Wt 291.0 lb

## 2022-07-16 DIAGNOSIS — M47816 Spondylosis without myelopathy or radiculopathy, lumbar region: Secondary | ICD-10-CM | POA: Diagnosis not present

## 2022-07-16 DIAGNOSIS — M5416 Radiculopathy, lumbar region: Secondary | ICD-10-CM | POA: Diagnosis not present

## 2022-07-16 DIAGNOSIS — G4710 Hypersomnia, unspecified: Secondary | ICD-10-CM | POA: Diagnosis not present

## 2022-07-16 DIAGNOSIS — G4733 Obstructive sleep apnea (adult) (pediatric): Secondary | ICD-10-CM | POA: Diagnosis not present

## 2022-07-16 MED ORDER — HYDROCODONE-ACETAMINOPHEN 5-325 MG PO TABS: 1.0000 | ORAL_TABLET | Freq: Four times a day (QID) | ORAL | 0 refills | Status: DC | PRN

## 2022-07-16 NOTE — Patient Instructions (Signed)
Use hydrocodone if needed.  Referral entered to neurosurgery-You will be contacted to set up appt.    Radicular Pain Radicular pain is a type of pain that spreads from your back or neck along a spinal nerve. Spinal nerves are nerves that leave the spinal cord and go to the muscles. Radicular pain is sometimes called radiculopathy, radiculitis, or a pinched nerve. When you have this type of pain, you may also have weakness, numbness, or tingling in the area of your body that is supplied by the nerve. The pain may feel sharp and burning. Depending on which spinal nerve is affected, the pain may occur in the: Neck area (cervical radicular pain). You may also feel pain, numbness, weakness, or tingling in the arms. Mid-spine area (thoracic radicular pain). You would feel this pain in the back and chest. This type is rare. Lower back area (lumbar radicular pain). You would feel this pain as low back pain. You may feel pain, numbness, weakness, or tingling in the buttocks or legs. Sciatica is a type of lumbar radicular pain that shoots down the back of the leg. Radicular pain occurs when one of the spinal nerves becomes irritated or squeezed (compressed). It is often caused by something pushing on a spinal nerve, such as one of the bones of the spine (vertebrae) or one of the round cushions between vertebrae (intervertebral disks). This can result from: An injury. Wear and tear or aging of a disk. The growth of a bone spur that pushes on the nerve. Radicular pain often goes away when you follow instructions from your health care provider for relieving pain at home. How is this treated? Treatment may depend on the cause of the condition and may include: Working with a physical therapist. Taking pain medicine. Applying heat or ice or both to the affected areas. Doing stretches to improve flexibility. Having surgery. This may be needed if other treatments do not help. Different types of surgery may be done  depending on the cause of this condition. Follow these instructions at home: Managing pain     If directed, put ice on the affected area. To do this: Put ice in a plastic bag. Place a towel between your skin and the bag. Leave the ice on for 20 minutes, 2-3 times a day. Remove the ice if your skin turns bright red. This is very important. If you cannot feel pain, heat, or cold, you have a greater risk of damage to the area. If directed, apply heat to the affected area as often as told by your health care provider. Use the heat source that your health care provider recommends, such as a moist heat pack or a heating pad. Place a towel between your skin and the heat source. Leave the heat on for 20-30 minutes. Remove the heat if your skin turns bright red. This is especially important if you are unable to feel pain, heat, or cold. You have a greater risk of getting burned. Activity Do not sit or rest in bed for long periods of time. Try to stay as active as possible. Ask your health care provider what type of exercise or activity is best for you. Avoid activities that make your pain worse, such as bending and lifting. You may have to avoid lifting. Ask your health care provider how much you can safely lift. Practice using proper technique when lifting items. Proper lifting technique involves bending your knees and rising up. Do strength and range-of-motion exercises only as told by  your health care provider or physical therapist. General instructions Take over-the-counter and prescription medicines only as told by your health care provider. Pay attention to any changes in your symptoms. Keep all follow-up visits. This is important. Contact a health care provider if: Your pain and other symptoms get worse. Your pain medicine is not helping. Your pain has not improved after a few weeks of home care. You have a fever. Get help right away if: You have severe pain, weakness, or numbness. You  have difficulty with bladder or bowel control. Summary Radicular pain is a type of pain that spreads from your back or neck along a spinal nerve. When you have radicular pain, you may also have weakness, numbness, or tingling in the area of your body that is supplied by the nerve. The pain may feel sharp or burning. Radicular pain may be treated with ice, heat, medicines, or physical therapy. This information is not intended to replace advice given to you by your health care provider. Make sure you discuss any questions you have with your health care provider. Document Revised: 03/07/2021 Document Reviewed: 03/07/2021 Elsevier Patient Education  [place].

## 2022-07-16 NOTE — Progress Notes (Signed)
Tori Cupps - 71 y.o. male MRN [id number]  Date of birth: 07-Aug-1951  Subjective Chief Complaint  Patient presents with   Results    HPI [name] is a 71 year old male here today for follow-up of low back pain.  He had MRI completed over the weekend which resulted today.  He reports that his back pain continues to be quite bad.  He is having some difficulty walking due to his pain.  Combination of tramadol and meloxicam have not really been helpful.  His MRI does show multilevel degenerative disc disease with retrolisthesis at L1-L2 and L2-L3.  He does have a bulge at L2-L3 with possible impingement on the traversing nerve root On either side.  Additionally he has an area at L5-S1 with foraminal stenosis possibly contacting the exiting L5 nerve root.  He denies any problems with bowel or bladder continence.  ROS:  A comprehensive ROS was completed and negative except as noted per HPI  Allergies  Allergen Reactions   Morphine And Related     Rash, vomiting, itching     Past Medical History:  Diagnosis Date   Arthritis    Back pain    Diverticulosis of colon without hemorrhage 06/12/2016   On Colonoscopy 11/06/2010   HLD (hyperlipidemia) 05/29/2016   Hyperlipemia    Hypertension    Joint pain    Morbid obesity ([place]) 05/29/2016   Prediabetes    Right hip pain    needs hip replacement   Sleep apnea    on CPAP    Past Surgical History:  Procedure Laterality Date   EYE SURGERY     age 72   KNEE SURGERY     REPLACEMENT TOTAL HIP W/  RESURFACING IMPLANTS Right 48/1856   Dr. Aleda Grana    Social History   Socioeconomic History   Marital status: Married    Spouse name: [name]   Number of children: 4   Years of education: 18   Highest education level: Occupational hygienist History   Occupation: retired    Comment: Pharmacist, hospital  Tobacco Use   Smoking status: Never   Smokeless tobacco: Never  Scientific laboratory technician Use: Never used  Substance and Sexual Activity   Alcohol use:  Not Currently    Comment: rarely   Drug use: No   Sexual activity: Yes    Partners: Female  Other Topics Concern   Not on file  Social History Narrative   Lives with his wife. 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   Social Determinants of Health   Financial Resource Strain: Low Risk  (09/23/2021)   Overall Financial Resource Strain (CARDIA)    Difficulty of Paying Living Expenses: Not hard at all  Food Insecurity: No Food Insecurity (09/23/2021)   Hunger Vital Sign    Worried About Running Out of Food in the Last Year: Never true    Ran Out of Food in the Last Year: Never true  Transportation Needs: No Transportation Needs (09/23/2021)   PRAPARE - Hydrologist (Medical): No    Lack of Transportation (Non-Medical): No  Physical Activity: Inactive (09/23/2021)   Exercise Vital Sign    Days of Exercise per Week: 0 days    Minutes of Exercise per Session: 0 min  Stress: No Stress Concern Present (09/23/2021)   [place]    Feeling of Stress : Not at all  Social Connections:  Moderately Integrated (09/23/2021)   Social Connection and Isolation Panel [NHANES]    Frequency of Communication with Friends and Family: More than three times a week    Frequency of Social Gatherings with Friends and Family: Once a week    Attends Religious Services: More than 4 times per year    Active Member of [employer] or Organizations: No    Attends Archivist Meetings: Never    Marital Status: Married    Family History  Problem Relation Age of Onset   Lung disease Mother    Hypertension Mother    Lung disease Father    Alcoholism Father    Colon cancer Neg Hx    Colon polyps Neg Hx    Esophageal cancer Neg Hx    Stomach cancer Neg Hx    Rectal cancer Neg Hx     Health Maintenance  Topic Date Due   COVID-19 Vaccine (3 - Pfizer series) 10/16/2022  (Originally 03/12/2020)   Medicare Annual Wellness (AWV)  09/23/2022   TETANUS/TDAP  05/29/2026   Pneumonia Vaccine 49+ Years old  Completed   INFLUENZA VACCINE  Completed   Hepatitis C Screening  Completed   Zoster Vaccines- Shingrix  Completed   HPV VACCINES  Aged Out   COLONOSCOPY (Pts 45-73yr Insurance coverage will need to be confirmed)  Discontinued     ----------------------------------------------------------------------------------------------------------------------------------------------------------------------------------------------------------------- Physical Exam BP 135/81 (BP Location: Left Arm, Patient Position: Sitting, Cuff Size: Large)   Pulse 70   Ht '5\' 10"'$  (1.778 m)   Wt 291 lb (132 kg)   SpO2 97%   BMI 41.75 kg/m   Physical Exam Constitutional:      Appearance: Normal appearance.  Neurological:     Mental Status: He is alert.  Psychiatric:        Mood and Affect: Mood normal.        Behavior: Behavior normal.     ------------------------------------------------------------------------------------------------------------------------------------------------------------------------------------------------------------------- Assessment and Plan  Lumbar spondylosis Given his symptoms I suspect the impingement on the left L5 nerve root is the likely cause of his symptoms.  He would like referral to neurosurgery and referral placed today.  Adding hydrocodone to help with pain control.     Meds ordered this encounter  Medications   HYDROcodone-acetaminophen (NORCO) 5-325 MG tablet    Sig: Take 1 tablet by mouth every 6 (six) hours as needed for moderate pain.    Dispense:  30 tablet    Refill:  0    No follow-ups on file.    This visit occurred during the SARS-CoV-2 public health emergency.  Safety protocols were in place, including screening questions prior to the visit, additional usage of staff PPE, and extensive cleaning of exam room while  observing appropriate contact time as indicated for disinfecting solutions.

## 2022-07-16 NOTE — Assessment & Plan Note (Signed)
Given his symptoms I suspect the impingement on the left L5 nerve root is the likely cause of his symptoms.  He would like referral to neurosurgery and referral placed today.  Adding hydrocodone to help with pain control.

## 2022-07-18 ENCOUNTER — Encounter: Payer: Self-pay | Admitting: Family Medicine

## 2022-07-24 ENCOUNTER — Other Ambulatory Visit: Payer: Self-pay | Admitting: Family Medicine

## 2022-07-29 ENCOUNTER — Other Ambulatory Visit: Payer: Self-pay

## 2022-07-29 MED ORDER — HYDROCODONE-ACETAMINOPHEN 5-325 MG PO TABS: 1.0000 | ORAL_TABLET | Freq: Four times a day (QID) | ORAL | 0 refills | Status: DC | PRN

## 2022-07-31 DIAGNOSIS — M791 Myalgia, unspecified site: Secondary | ICD-10-CM | POA: Diagnosis not present

## 2022-07-31 DIAGNOSIS — Z6841 Body Mass Index (BMI) 40.0 and over, adult: Secondary | ICD-10-CM | POA: Diagnosis not present

## 2022-07-31 DIAGNOSIS — M5137 Other intervertebral disc degeneration, lumbosacral region: Secondary | ICD-10-CM | POA: Diagnosis not present

## 2022-08-11 DIAGNOSIS — G4733 Obstructive sleep apnea (adult) (pediatric): Secondary | ICD-10-CM | POA: Diagnosis not present

## 2022-08-12 DIAGNOSIS — C4442 Squamous cell carcinoma of skin of scalp and neck: Secondary | ICD-10-CM | POA: Diagnosis not present

## 2022-08-12 DIAGNOSIS — L570 Actinic keratosis: Secondary | ICD-10-CM | POA: Diagnosis not present

## 2022-08-16 ENCOUNTER — Other Ambulatory Visit: Payer: Self-pay | Admitting: Family Medicine

## 2022-08-16 DIAGNOSIS — E782 Mixed hyperlipidemia: Secondary | ICD-10-CM

## 2022-08-21 DIAGNOSIS — C4442 Squamous cell carcinoma of skin of scalp and neck: Secondary | ICD-10-CM | POA: Diagnosis not present

## 2022-08-25 ENCOUNTER — Other Ambulatory Visit: Payer: Self-pay | Admitting: Family Medicine

## 2022-08-26 DIAGNOSIS — U071 COVID-19: Secondary | ICD-10-CM | POA: Diagnosis not present

## 2022-08-26 DIAGNOSIS — R4182 Altered mental status, unspecified: Secondary | ICD-10-CM | POA: Diagnosis not present

## 2022-08-26 DIAGNOSIS — R531 Weakness: Secondary | ICD-10-CM | POA: Diagnosis not present

## 2022-08-26 DIAGNOSIS — R930 Abnormal findings on diagnostic imaging of skull and head, not elsewhere classified: Secondary | ICD-10-CM | POA: Diagnosis not present

## 2022-08-26 DIAGNOSIS — R9431 Abnormal electrocardiogram [ECG] [EKG]: Secondary | ICD-10-CM | POA: Diagnosis not present

## 2022-08-29 ENCOUNTER — Ambulatory Visit: Payer: Medicare PPO | Admitting: Family Medicine

## 2022-09-02 ENCOUNTER — Ambulatory Visit (INDEPENDENT_AMBULATORY_CARE_PROVIDER_SITE_OTHER): Payer: Medicare PPO | Admitting: Family Medicine

## 2022-09-02 ENCOUNTER — Encounter: Payer: Self-pay | Admitting: Family Medicine

## 2022-09-02 VITALS — BP 133/83 | HR 68 | Ht 70.0 in | Wt 288.0 lb

## 2022-09-02 DIAGNOSIS — M47816 Spondylosis without myelopathy or radiculopathy, lumbar region: Secondary | ICD-10-CM

## 2022-09-02 DIAGNOSIS — R930 Abnormal findings on diagnostic imaging of skull and head, not elsewhere classified: Secondary | ICD-10-CM | POA: Diagnosis not present

## 2022-09-02 DIAGNOSIS — U071 COVID-19: Secondary | ICD-10-CM

## 2022-09-02 NOTE — Progress Notes (Signed)
John Morrow - 71 y.o. male MRN [id number]  Date of birth: February 23, 1951  Subjective Chief Complaint  Patient presents with   Hospitalization Follow-up   Results    HPI John Morrow is a 71 year old male here today for hospital follow-up.  He was seen in the ED at [facility] due to alteration of mental status.  Noted to have dehydration and tested positive for COVID.  He was given IV fluids and had additional workup for altered mental status.  CT of the head did show a slightly enlarged lateral ventricles with recommendation to correlate for normal pressure hydrocephalus.  He did have improvement with observation and IV fluids.  No significant abnormalities or metabolic derangements noted.  Reports he is feeling well at this time.  No continued symptoms related to COVID at this time.  He does have concerns about his CT scan.  He denies any gait changes, bowel or bladder changes, increased confusion or significant memory changes since recovered from John Morrow.  Back pain is improved at this point.  He did see neurosurgery who recommended that he see Dr. Margaretha Seeds pain measures.  ROS:  A comprehensive ROS was completed and negative except as noted per HPI  Allergies  Allergen Reactions   Morphine And Related     Rash, vomiting, itching     Past Medical History:  Diagnosis Date   Arthritis    Back pain    Diverticulosis of colon without hemorrhage 06/12/2016   On Colonoscopy 11/06/2010   HLD (hyperlipidemia) 05/29/2016   Hyperlipemia    Hypertension    Joint pain    Morbid obesity ([place]) 05/29/2016   Prediabetes    Right hip pain    needs hip replacement   Sleep apnea    on CPAP    Past Surgical History:  Procedure Laterality Date   EYE SURGERY     age 29   KNEE SURGERY     REPLACEMENT TOTAL HIP W/  RESURFACING IMPLANTS Right 92/3300   Dr. Aleda Grana    Social History   Socioeconomic History   Marital status: Married    Spouse name: [name]   Number of children: 4    Years of education: 18   Highest education level: Occupational hygienist History   Occupation: retired    Comment: Pharmacist, hospital  Tobacco Use   Smoking status: Never   Smokeless tobacco: Never  Scientific laboratory technician Use: Never used  Substance and Sexual Activity   Alcohol use: Not Currently    Comment: rarely   Drug use: No   Sexual activity: Yes    Partners: Female  Other Topics Concern   Not on file  Social History Narrative   Lives with his wife. 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   Social Determinants of Health   Financial Resource Strain: Low Risk  (09/23/2021)   Overall Financial Resource Strain (CARDIA)    Difficulty of Paying Living Expenses: Not hard at all  Food Insecurity: No Food Insecurity (09/23/2021)   Hunger Vital Sign    Worried About Running Out of Food in the Last Year: Never true    Ran Out of Food in the Last Year: Never true  Transportation Needs: No Transportation Needs (09/23/2021)   PRAPARE - Hydrologist (Medical): No    Lack of Transportation (Non-Medical): No  Physical Activity: Inactive (09/23/2021)   Exercise Vital Sign    Days of Exercise per Week:  0 days    Minutes of Exercise per Session: 0 min  Stress: No Stress Concern Present (09/23/2021)   [place]    Feeling of Stress : Not at all  Social Connections: Moderately Integrated (09/23/2021)   Social Connection and Isolation Panel [NHANES]    Frequency of Communication with Friends and Family: More than three times a week    Frequency of Social Gatherings with Friends and Family: Once a week    Attends Religious Services: More than 4 times per year    Active Member of Clubs or Organizations: No    Attends Archivist Meetings: Never    Marital Status: Married    Family History  Problem Relation Age of Onset   Lung disease Mother     Hypertension Mother    Lung disease Father    Alcoholism Father    Colon cancer Neg Hx    Colon polyps Neg Hx    Esophageal cancer Neg Hx    Stomach cancer Neg Hx    Rectal cancer Neg Hx     Health Maintenance  Topic Date Due   Medicare Annual Wellness (AWV)  09/23/2022   COVID-19 Vaccine (3 - 2023-24 season) 10/16/2022 (Originally 05/16/2022)   DTaP/Tdap/Td (2 - Td or Tdap) 05/29/2026   Pneumonia Vaccine 84+ Years old  Completed   INFLUENZA VACCINE  Completed   Hepatitis C Screening  Completed   Zoster Vaccines- Shingrix  Completed   HPV VACCINES  Aged Out   COLONOSCOPY (Pts 45-11yr Insurance coverage will need to be confirmed)  Discontinued     ----------------------------------------------------------------------------------------------------------------------------------------------------------------------------------------------------------------- Physical Exam BP 133/83 (BP Location: Right Arm, Patient Position: Sitting, Cuff Size: Large)   Pulse 68   Ht '5\' 10"'$  (1.778 m)   Wt 288 lb (130.6 kg)   SpO2 97%   BMI 41.32 kg/m   Physical Exam Constitutional:      Appearance: Normal appearance.  HENT:     Head: Normocephalic and atraumatic.  Eyes:     General: No scleral icterus. Cardiovascular:     Rate and Rhythm: Normal rate and regular rhythm.  Pulmonary:     Effort: Pulmonary effort is normal.     Breath sounds: Normal breath sounds.  Musculoskeletal:     Cervical back: Neck supple.  Neurological:     Mental Status: He is alert.  Psychiatric:        Mood and Affect: Mood normal.        Behavior: Behavior normal.     ------------------------------------------------------------------------------------------------------------------------------------------------------------------------------------------------------------------- Assessment and Plan  COVID-19 Recent alteration of mental status related t secondary to dehydration from COVID-19.  Resolved at this  point.  Abnormal CT scan of head Borderline enlargement of lateral ventricles recent CT scan.  No symptoms suggestive of normal pressure hydrocephalus at this time.  Given handout with recommendations to monitor for evolving symptoms.  Lumbar spondylosis Back pain stable at this time.  Follow-up with interventional pain.   No orders of the defined types were placed in this encounter.   No follow-ups on file.    This visit occurred during the SARS-CoV-2 public health emergency.  Safety protocols were in place, including screening questions prior to the visit, additional usage of staff PPE, and extensive cleaning of exam room while observing appropriate contact time as indicated for disinfecting solutions.

## 2022-09-02 NOTE — Patient Instructions (Signed)
Normal-Pressure Hydrocephalus Normal-pressure hydrocephalus (NPH) is a buildup of fluid (cerebrospinal fluid, or CSF) inside the brain. This does not typically increase the pressure inside the brain (intracranial pressure). CSF is normally present in the brain, but when too much CSF is produced in the brain, it can affect brain function. NPH usually occurs in people who are older than age 72. Many symptoms of NPH are also associated with aging or certain medical conditions. It is important to pay attention to changes in behavior and mental function. What are the causes? This condition may be caused by anything that blocks the flow of CSF, such as: Head injury. Infections. Bleeding from a blood vessel in the brain. In many cases, the cause of this condition is not known. What are the signs or symptoms? Symptoms of this condition may include: Difficulty walking, such as: Shuffling feet when walking. Unsteadiness. Problems when beginning to walk. Feet feeling as though they are stuck or "frozen" to the floor. Problems with bowel and bladder control. Problems with memory and thinking, such as: Forgetfulness. Lack of concentration. Mood problems. Confusion. How is this diagnosed? This condition is diagnosed based on: Your medical history. A physical exam. This can reveal walking (gait) changes or twitching or sudden muscle tightening (spasms) in the legs. Other tests to confirm the diagnosis and identify the best options for treatment. These tests include: Removal and examination of a large amount of CSF (large-volume lumbar puncture). CT scan of your head. MRI scan of your head. Cisternogram of your head. This test involves a lumbar puncture where a radioactive dye is put into your CSF. Nuclear medicine images are taken to see how well the dye is flowing through the network of cavities within your brain (ventricles). How is this treated?  This condition may be treated with surgery in  which a narrow tube (ventriculoperitoneal shunt, or VP shunt) is placed in the brain to drain excess CSF into another part of the body. After a shunt is placed, you will be monitored to make sure CSF is draining properly. Depending on your age and overall health condition, you may not be a candidate for surgery. In some cases, a shunt may be placed in the lumbar spine (lumbar peritoneal shunt) instead of the brain. If your symptoms are mild, your condition may be monitored on a regular basis before a decision is made about whether a shunt is needed. Follow these instructions at home: Medicines Take over-the-counter and prescription medicines only as told by your health care provider. Avoid taking medicines that can affect thinking, such as pain or sleeping medicines. Lifestyle Do not use any products that contain nicotine or tobacco. These products include cigarettes, chewing tobacco, and vaping devices, such as e-cigarettes. If you need help quitting, ask your health care provider. Drink enough fluid to keep your urine pale yellow. If you have a shunt: Do not wear tight-fitting hats or headgear. Before having an MRI or abdominal surgery, tell your health care provider that you have a shunt. Watch for signs of infection, such as: Fever. Redness, pain, or swelling of the skin along the shunt path. Headache or stiff neck. Nausea or vomiting. General instructions Work with your health care provider to determine what you need help with and what your safety needs are. Ask your health care provider when you can return to your normal activities. Keep all follow-up visits. This is important. Contact a health care provider if: You have any new symptoms. Your symptoms get worse. Your symptoms do  not improve after surgery. You lose coordination or balance. You or your family members become concerned for your safety. Get help right away if: You have: A fever or other signs of infection. New or  worsening confusion. New or worsening sleepiness. A hard time staying awake. A headache that is getting worse. Blurred vision, especially early in the morning. You start to twitch or shake (seizure). These symptoms may represent a serious problem that is an emergency. Do not wait to see if the symptoms will go away. Get medical help right away. Call your local emergency services (911 in the [place] Do not drive yourself to the hospital. Summary Normal-pressure hydrocephalus (NPH) is a buildup of fluid (cerebrospinal fluid, or CSF) inside the brain. When too much CSF is produced in the brain, it can affect brain function. Anything that blocks the flow of CSF can cause this condition. In some cases, the cause of this condition is not known. This condition may be treated with surgery in which a narrow tube (ventriculoperitoneal shunt, or VP shunt) is placed in the brain to drain excess CSF into another part of the body. This information is not intended to replace advice given to you by your health care provider. Make sure you discuss any questions you have with your health care provider. Document Revised: 07/30/2020 Document Reviewed: 07/30/2020 Elsevier Patient Education  [place].

## 2022-09-02 NOTE — Assessment & Plan Note (Signed)
Back pain stable at this time.  Follow-up with interventional pain.

## 2022-09-02 NOTE — Assessment & Plan Note (Signed)
Recent alteration of mental status related t secondary to dehydration from COVID-19.  Resolved at this point.

## 2022-09-02 NOTE — Assessment & Plan Note (Signed)
Borderline enlargement of lateral ventricles recent CT scan.  No symptoms suggestive of normal pressure hydrocephalus at this time.  Given handout with recommendations to monitor for evolving symptoms.

## 2022-09-28 ENCOUNTER — Other Ambulatory Visit: Payer: Self-pay | Admitting: Family Medicine

## 2022-09-28 DIAGNOSIS — M1712 Unilateral primary osteoarthritis, left knee: Secondary | ICD-10-CM

## 2022-09-29 NOTE — Telephone Encounter (Signed)
[facility] requesting med refill for tramadol. Rx not listed in active med list.

## 2022-10-01 DIAGNOSIS — M47816 Spondylosis without myelopathy or radiculopathy, lumbar region: Secondary | ICD-10-CM | POA: Diagnosis not present

## 2022-10-02 ENCOUNTER — Ambulatory Visit (INDEPENDENT_AMBULATORY_CARE_PROVIDER_SITE_OTHER): Payer: Medicare PPO | Admitting: Family Medicine

## 2022-10-02 DIAGNOSIS — Z0000 Encounter for general adult medical examination without abnormal findings: Secondary | ICD-10-CM | POA: Diagnosis not present

## 2022-10-02 NOTE — Patient Instructions (Signed)
[place] Maintenance Summary and Written Plan of Care  John Morrow ,  Thank you for allowing me to perform your Medicare Annual Wellness Visit and for your ongoing commitment to your health.   Health Maintenance & Immunization History Health Maintenance  Topic Date Due   COVID-19 Vaccine (3 - 2023-24 season) 10/16/2022 (Originally 05/16/2022)   Medicare Annual Wellness (AWV)  10/03/2023   DTaP/Tdap/Td (2 - Td or Tdap) 05/29/2026   Pneumonia Vaccine 82+ Years old  Completed   INFLUENZA VACCINE  Completed   Hepatitis C Screening  Completed   Zoster Vaccines- Shingrix  Completed   HPV VACCINES  Aged Out   COLONOSCOPY (Pts 45-45yr Insurance coverage will need to be confirmed)  Discontinued   Immunization History  Administered Date(s) Administered   Fluad Quad(high Dose 65+) 07/03/2021, 05/29/2022   Influenza, High Dose Seasonal PF 05/05/2017, 05/18/2019, 06/15/2020   Influenza,inj,Quad PF,6+ Mos 05/29/2016, 05/27/2018   Influenza-Unspecified 06/20/2020   PFIZER(Purple Top)SARS-COV-2 Vaccination 12/26/2019, 01/16/2020   Pneumococcal Conjugate-13 06/10/2016   Pneumococcal Polysaccharide-23 06/17/2017   Tdap 05/29/2016   Zoster Recombinat (Shingrix) 11/29/2016, 01/30/2017, 06/28/2020   Zoster, Live 01/17/2012    These are the patient goals that we discussed:  Goals Addressed               This Visit's Progress     Patient Stated (pt-stated)        Patient stated that he would like to have better mobility without pain.         This is a list of Health Maintenance Items that are overdue or due now: There are no preventive care reminders to display for this patient.    Orders/Referrals Placed Today: No orders of the defined types were placed in this encounter.  (Contact our referral department at [phone] you have not spoken with someone about your referral appointment within the next 5 days)    Follow-up Plan Follow-up with  MLuetta Nutting DO as planned Medicare wellness visit in one year. Patient will access AVS on my chart.      Health Maintenance, Male Adopting a healthy lifestyle and getting preventive care are important in promoting health and wellness. Ask your health care provider about: The right schedule for you to have regular tests and exams. Things you can do on your own to prevent diseases and keep yourself healthy. What should I know about diet, weight, and exercise? Eat a healthy diet  Eat a diet that includes plenty of vegetables, fruits, low-fat dairy products, and lean protein. Do not eat a lot of foods that are high in solid fats, added sugars, or sodium. Maintain a healthy weight Body mass index (BMI) is a measurement that can be used to identify possible weight problems. It estimates body fat based on height and weight. Your health care provider can help determine your BMI and help you achieve or maintain a healthy weight. Get regular exercise Get regular exercise. This is one of the most important things you can do for your health. Most adults should: Exercise for at least 150 minutes each week. The exercise should increase your heart rate and make you sweat (moderate-intensity exercise). Do strengthening exercises at least twice a week. This is in addition to the moderate-intensity exercise. Spend less time sitting. Even light physical activity can be beneficial. Watch cholesterol and blood lipids Have your blood tested for lipids and cholesterol at 73years of age, then have this test every 5 years. You may  need to have your cholesterol levels checked more often if: Your lipid or cholesterol levels are high. You are older than 72 years of age. You are at high risk for heart disease. What should I know about cancer screening? Many types of cancers can be detected early and may often be prevented. Depending on your health history and family history, you may need to have cancer  screening at various ages. This may include screening for: Colorectal cancer. Prostate cancer. Skin cancer. Lung cancer. What should I know about heart disease, diabetes, and high blood pressure? Blood pressure and heart disease High blood pressure causes heart disease and increases the risk of stroke. This is more likely to develop in people who have high blood pressure readings or are overweight. Talk with your health care provider about your target blood pressure readings. Have your blood pressure checked: Every 3-5 years if you are 3-22 years of age. Every year if you are 64 years old or older. If you are between the ages of 76 and 104 and are a current or former smoker, ask your health care provider if you should have a one-time screening for abdominal aortic aneurysm (AAA). Diabetes Have regular diabetes screenings. This checks your fasting blood sugar level. Have the screening done: Once every three years after age 28 if you are at a normal weight and have a low risk for diabetes. More often and at a younger age if you are overweight or have a high risk for diabetes. What should I know about preventing infection? Hepatitis B If you have a higher risk for hepatitis B, you should be screened for this virus. Talk with your health care provider to find out if you are at risk for hepatitis B infection. Hepatitis C Blood testing is recommended for: Everyone born from [birth year] through 1966. Anyone with known risk factors for hepatitis C. Sexually transmitted infections (STIs) You should be screened each year for STIs, including gonorrhea and chlamydia, if: You are sexually active and are younger than 73 years of age. You are older than 73 years of age and your health care provider tells you that you are at risk for this type of infection. Your sexual activity has changed since you were last screened, and you are at increased risk for chlamydia or gonorrhea. Ask your health care provider if  you are at risk. Ask your health care provider about whether you are at high risk for HIV. Your health care provider may recommend a prescription medicine to help prevent HIV infection. If you choose to take medicine to prevent HIV, you should first get tested for HIV. You should then be tested every 3 months for as long as you are taking the medicine. Follow these instructions at home: Alcohol use Do not drink alcohol if your health care provider tells you not to drink. If you drink alcohol: Limit how much you have to 0-2 drinks a day. Know how much alcohol is in your drink. In the [place] one drink equals one 12 oz bottle of beer (355 mL), one 5 oz glass of wine (148 mL), or one 1 oz glass of hard liquor (44 mL). Lifestyle Do not use any products that contain nicotine or tobacco. These products include cigarettes, chewing tobacco, and vaping devices, such as e-cigarettes. If you need help quitting, ask your health care provider. Do not use street drugs. Do not share needles. Ask your health care provider for help if you need support or information about quitting drugs.  General instructions Schedule regular health, dental, and eye exams. Stay current with your vaccines. Tell your health care provider if: You often feel depressed. You have ever been abused or do not feel safe at home. Summary Adopting a healthy lifestyle and getting preventive care are important in promoting health and wellness. Follow your health care provider's instructions about healthy diet, exercising, and getting tested or screened for diseases. Follow your health care provider's instructions on monitoring your cholesterol and blood pressure. This information is not intended to replace advice given to you by your health care provider. Make sure you discuss any questions you have with your health care provider. Document Revised: 01/21/2021 Document Reviewed: 01/21/2021 Elsevier Patient Education  [place].

## 2022-10-02 NOTE — Progress Notes (Signed)
MEDICARE ANNUAL WELLNESS VISIT  10/02/2022  Telephone Visit Disclaimer This Medicare AWV was conducted by telephone due to national recommendations for restrictions regarding the COVID-19 Pandemic (e.g. social distancing).  I verified, using two identifiers, that I am speaking with John Morrow or their authorized healthcare agent. I discussed the limitations, risks, security, and privacy concerns of performing an evaluation and management service by telephone and the potential availability of an in-person appointment in the future. The patient expressed understanding and agreed to proceed.  Location of Patient: Home Location of Provider (nurse):  In the office.  Subjective:    John Morrow is a 72 y.o. male patient of Luetta Nutting, DO who had a Medicare Annual Wellness Visit today via telephone. John Morrow is Retired and lives with their spouse. he has 4 children. he reports that he is socially active and does interact with friends/family regularly. 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  Patient Care Team: Luetta Nutting, DO as PCP - General (Family Medicine)     10/02/2022    8:17 AM 04/15/2022    8:03 AM 03/29/2022   12:04 PM 09/23/2021    8:17 AM 02/13/2020   10:10 AM 09/07/2019    9:06 AM 09/06/2018    9:12 AM  Advanced Directives  Does Patient Have a Medical Advance Directive? Yes No No Yes Yes Yes Yes  Type of Advance Directive Living will   Living will;Healthcare Power of [place];Living will [place];Living will  Does patient want to make changes to medical advance directive? No - Patient declined   No - Patient declined  No - Patient declined No - Patient declined  Copy of [place] in Chart?    No - copy requested  Yes - validated most recent copy scanned in chart (See row information) Yes - validated most recent copy scanned in  chart (See row information)  Would patient like information on creating a medical advance directive?  No - Patient declined         Hospital Utilization Over the Past 12 Months: # of hospitalizations or ER visits: 2 # of surgeries: 0  Review of Systems    Patient reports that his overall health is unchanged compared to last year.  History obtained from chart review and the patient  Patient Reported Readings (BP, Pulse, CBG, Weight, etc) none  Pain Assessment Pain : No/denies pain     Current Medications & Allergies (verified) Allergies as of 10/02/2022       Reactions   Morphine And Related    Rash, vomiting, itching        Medication List        Accurate as of October 02, 2022  8:22 AM. If you have any questions, ask your nurse or doctor.          atorvastatin 20 MG tablet Commonly known as: LIPITOR Take 1 tablet by mouth once daily   cyanocobalamin 1000 MCG tablet Commonly known as: VITAMIN B12 Take 1,000 mcg by mouth daily.   Ginkgo Biloba 40 MG Tabs Take by mouth.   HYDROcodone-acetaminophen 5-325 MG tablet Commonly known as: Norco Take 1 tablet by mouth every 6 (six) hours as needed for moderate pain.   lisinopril-hydrochlorothiazide 20-25 MG tablet Commonly known as: ZESTORETIC Take 1 tablet by mouth once daily   meloxicam 15 MG tablet Commonly known as: MOBIC One tab PO qAM with  a meal for 2 weeks, then daily prn pain.   Misc. Devices Misc Start auto cpap 5-15 cm. Water with mask and supplies   omeprazole 20 MG capsule Commonly known as: PRILOSEC Take 1 capsule (20 mg total) by mouth daily.   tamsulosin 0.4 MG Caps capsule Commonly known as: FLOMAX Take 1 capsule by mouth once daily   traMADol 50 MG tablet Commonly known as: ULTRAM TAKE 1 TO 2 TABLETS BY MOUTH EVERY 8 HOURS AS NEEDED FOR MODERATE PAIN . DO NOT EXCEED 6 PER 24 HOURS   Vitamin D3 10 MCG (400 UNIT) Caps Take by mouth.        History (reviewed): Past Medical  History:  Diagnosis Date   Allergy    Arthritis    Back pain    Diverticulosis of colon without hemorrhage 06/12/2016   On Colonoscopy 11/06/2010   HLD (hyperlipidemia) 05/29/2016   Hyperlipemia    Hypertension    Joint pain    Morbid obesity ([place]) 05/29/2016   Prediabetes    Right hip pain    needs hip replacement   Sleep apnea    on CPAP   Past Surgical History:  Procedure Laterality Date   EYE SURGERY     age 73   JOINT REPLACEMENT     Hip   KNEE SURGERY     REPLACEMENT TOTAL HIP W/  RESURFACING IMPLANTS Right 24/2683   Dr. Aleda Grana   Family History  Problem Relation Age of Onset   Lung disease Mother    Hypertension Mother    Lung disease Father    Alcoholism Father    Colon cancer Neg Hx    Colon polyps Neg Hx    Esophageal cancer Neg Hx    Stomach cancer Neg Hx    Rectal cancer Neg Hx    Social History   Socioeconomic History   Marital status: Married    Spouse name: John Morrow   Number of children: 4   Years of education: 80   Highest education level: Occupational hygienist History   Occupation: retired    Comment: Pharmacist, hospital  Tobacco Use   Smoking status: Never   Smokeless tobacco: Never  Scientific laboratory technician Use: Never used  Substance and Sexual Activity   Alcohol use: Not Currently    Comment: rarely   Drug use: No   Sexual activity: Not Currently    Partners: Female  Other Topics Concern   Not on file  Social History Narrative   Lives with his wife. 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   Social Determinants of Health   Financial Resource Strain: Low Risk  (09/28/2022)   Overall Financial Resource Strain (CARDIA)    Difficulty of Paying Living Expenses: Not hard at all  Food Insecurity: No Food Insecurity (09/28/2022)   Hunger Vital Sign    Worried About Running Out of Food in the Last Year: Never true    Ran Out of Food in the Last Year: Never true  Transportation Needs: No  Transportation Needs (09/28/2022)   PRAPARE - Hydrologist (Medical): No    Lack of Transportation (Non-Medical): No  Physical Activity: Insufficiently Active (09/28/2022)   Exercise Vital Sign    Days of Exercise per Week: 3 days    Minutes of Exercise per Session: 20 min  Stress: No Stress Concern Present (09/28/2022)   [place]  Feeling of Stress : Not at all  Social Connections: Moderately Integrated (10/02/2022)   Social Connection and Isolation Panel [NHANES]    Frequency of Communication with Friends and Family: Three times a week    Frequency of Social Gatherings with Friends and Family: Twice a week    Attends Religious Services: More than 4 times per year    Active Member of [employer] or Organizations: No    Attends Archivist Meetings: Never    Marital Status: Married    Activities of Daily Living    09/28/2022    8:47 AM  In your present state of health, do you have any difficulty performing the following activities:  Hearing? 0  Vision? 0  Difficulty concentrating or making decisions? 0  Walking or climbing stairs? 0  Dressing or bathing? 0  Doing errands, shopping? 0  Preparing Food and eating ? N  Using the Toilet? N  In the past six months, have you accidently leaked urine? N  Do you have problems with loss of bowel control? N  Managing your Medications? N  Managing your Finances? N  Housekeeping or managing your Housekeeping? N    Patient Education/ Literacy How often do you need to have someone help you when you read instructions, pamphlets, or other written materials from your doctor or pharmacy?: 1 - Never What is the last grade level you completed in school?: Doctoral degree  Exercise Current Exercise Habits: Home exercise routine, Type of exercise: walking, Time (Minutes): 30, Frequency (Times/Week): 3, Weekly Exercise (Minutes/Week): 90, Intensity:  Moderate, Exercise limited by: orthopedic condition(s)  Diet Patient reports consuming  2-3  meals a day and 2 snack(s) a day Patient reports that his primary diet is: Regular Patient reports that she does have regular access to food.   Depression Screen    10/02/2022    8:17 AM 04/07/2022   11:26 AM 02/19/2022    8:43 AM 12/24/2021    2:31 PM 09/23/2021    8:17 AM 02/19/2021    8:09 AM 09/07/2019    9:07 AM  PHQ 2/9 Scores  PHQ - 2 Score 0 2 2 0 0 0 0  PHQ- 9 Score  6 2         Fall Risk    10/02/2022    8:17 AM 09/28/2022    8:47 AM 12/24/2021    2:31 PM 09/23/2021    8:17 AM 02/19/2021    8:09 AM  Fall Risk   Falls in the past year? 0 0 1 0 0  Number falls in past yr: 0 0 0 0 0  Injury with Fall? 0 0 0 0 0  Risk for fall due to : Impaired mobility  No Fall Risks No Fall Risks No Fall Risks  Follow up Falls evaluation completed  Falls evaluation completed Falls evaluation completed Falls evaluation completed     Objective:  John Morrow seemed alert and oriented and he participated appropriately during our telephone visit.  Blood Pressure Weight BMI  BP Readings from Last 3 Encounters:  09/02/22 133/83  07/16/22 135/81  07/08/22 113/74   Wt Readings from Last 3 Encounters:  09/02/22 288 lb (130.6 kg)  07/16/22 291 lb (132 kg)  07/08/22 291 lb (132 kg)   BMI Readings from Last 1 Encounters:  09/02/22 41.32 kg/m    *Unable to obtain current vital signs, weight, and BMI due to telephone visit type  Hearing/Vision  John Morrow did not seem to have difficulty with hearing/understanding  during the telephone conversation Reports that he has had a formal eye exam by an eye care professional within the past year Reports that he has not had a formal hearing evaluation within the past year *Unable to fully assess hearing and vision during telephone visit type  Cognitive Function:    10/02/2022    8:19 AM 09/23/2021    8:22 AM 09/07/2019    9:10 AM 09/06/2018    9:16 AM 06/17/2017     9:24 AM  6CIT Screen  What Year? 0 points 0 points 0 points 0 points 0 points  What month? 0 points 0 points 0 points 0 points 0 points  What time? 0 points 0 points 0 points 0 points 0 points  Count back from 20 0 points 0 points 0 points 0 points 0 points  Months in reverse 0 points 0 points 0 points 0 points 0 points  Repeat phrase 0 points 0 points 0 points 0 points 2 points  Total Score 0 points 0 points 0 points 0 points 2 points   (Normal:0-7, Significant for Dysfunction: >8)  Normal Cognitive Function Screening: Yes   Immunization & Health Maintenance Record Immunization History  Administered Date(s) Administered   Fluad Quad(high Dose 65+) 07/03/2021, 05/29/2022   Influenza, High Dose Seasonal PF 05/05/2017, 05/18/2019, 06/15/2020   Influenza,inj,Quad PF,6+ Mos 05/29/2016, 05/27/2018   Influenza-Unspecified 06/20/2020   PFIZER(Purple Top)SARS-COV-2 Vaccination 12/26/2019, 01/16/2020   Pneumococcal Conjugate-13 06/10/2016   Pneumococcal Polysaccharide-23 06/17/2017   Tdap 05/29/2016   Zoster Recombinat (Shingrix) 11/29/2016, 01/30/2017, 06/28/2020   Zoster, Live 01/17/2012    Health Maintenance  Topic Date Due   COVID-19 Vaccine (3 - 2023-24 season) 10/16/2022 (Originally 05/16/2022)   Medicare Annual Wellness (AWV)  10/03/2023   DTaP/Tdap/Td (2 - Td or Tdap) 05/29/2026   Pneumonia Vaccine 47+ Years old  Completed   INFLUENZA VACCINE  Completed   Hepatitis C Screening  Completed   Zoster Vaccines- Shingrix  Completed   HPV VACCINES  Aged Out   COLONOSCOPY (Pts 45-96yr Insurance coverage will need to be confirmed)  Discontinued       Assessment  This is a routine wellness examination for [employer]  Health Maintenance: Due or Overdue There are no preventive care reminders to display for this patient.   RTraci Sermondoes not need a referral for Community Assistance: Care Management:   no Social Work:    no Prescription  Assistance:  no Nutrition/Diabetes Education:  no   Plan:  Personalized Goals  Goals Addressed               This Visit's Progress     Patient Stated (pt-stated)        Patient stated that he would like to have better mobility without pain.       Personalized Health Maintenance & Screening Recommendations  There are no preventive care reminders to display for this patient.  Lung Cancer Screening Recommended: no (Low Dose CT Chest recommended if Age 73-80years, 30 pack-year currently smoking OR have quit w/in past 15 years) Hepatitis C Screening recommended: no HIV Screening recommended: no  Advanced Directives: Written information was not prepared per patient's request.  Referrals & Orders No orders of the defined types were placed in this encounter.   Follow-up Plan Follow-up with MLuetta Nutting DO as planned Medicare wellness visit in one year. Patient will access AVS on my chart.   I have personally reviewed and noted the following in the patient's chart:  Medical and social history Use of alcohol, tobacco or illicit drugs  Current medications and supplements Functional ability and status Nutritional status Physical activity Advanced directives List of other physicians Hospitalizations, surgeries, and ER visits in previous 12 months Vitals Screenings to include cognitive, depression, and falls Referrals and appointments  In addition, I have reviewed and discussed with John Morrow certain preventive protocols, quality metrics, and best practice recommendations. A written personalized care plan for preventive services as well as general preventive health recommendations is available and can be mailed to the patient at his request.      Tinnie Gens, RN BSN  10/02/2022

## 2022-10-05 DIAGNOSIS — G4733 Obstructive sleep apnea (adult) (pediatric): Secondary | ICD-10-CM | POA: Diagnosis not present

## 2022-10-21 DIAGNOSIS — M47816 Spondylosis without myelopathy or radiculopathy, lumbar region: Secondary | ICD-10-CM | POA: Diagnosis not present

## 2022-10-28 ENCOUNTER — Other Ambulatory Visit: Payer: Self-pay | Admitting: Family Medicine

## 2022-10-30 ENCOUNTER — Other Ambulatory Visit: Payer: Self-pay | Admitting: Family Medicine

## 2022-10-30 DIAGNOSIS — M1712 Unilateral primary osteoarthritis, left knee: Secondary | ICD-10-CM

## 2022-11-05 DIAGNOSIS — G4733 Obstructive sleep apnea (adult) (pediatric): Secondary | ICD-10-CM | POA: Diagnosis not present

## 2022-11-06 DIAGNOSIS — M47816 Spondylosis without myelopathy or radiculopathy, lumbar region: Secondary | ICD-10-CM | POA: Diagnosis not present

## 2022-11-12 ENCOUNTER — Other Ambulatory Visit: Payer: Self-pay | Admitting: Family Medicine

## 2022-11-12 DIAGNOSIS — E782 Mixed hyperlipidemia: Secondary | ICD-10-CM

## 2022-11-12 DIAGNOSIS — G4733 Obstructive sleep apnea (adult) (pediatric): Secondary | ICD-10-CM | POA: Diagnosis not present

## 2022-11-17 ENCOUNTER — Other Ambulatory Visit: Payer: Self-pay | Admitting: Family Medicine

## 2022-11-20 DIAGNOSIS — L570 Actinic keratosis: Secondary | ICD-10-CM | POA: Diagnosis not present

## 2022-11-20 DIAGNOSIS — L578 Other skin changes due to chronic exposure to nonionizing radiation: Secondary | ICD-10-CM | POA: Diagnosis not present

## 2022-11-20 DIAGNOSIS — C4442 Squamous cell carcinoma of skin of scalp and neck: Secondary | ICD-10-CM | POA: Diagnosis not present

## 2022-11-26 DIAGNOSIS — M47816 Spondylosis without myelopathy or radiculopathy, lumbar region: Secondary | ICD-10-CM | POA: Diagnosis not present

## 2022-12-03 DIAGNOSIS — G4733 Obstructive sleep apnea (adult) (pediatric): Secondary | ICD-10-CM | POA: Diagnosis not present

## 2022-12-04 DIAGNOSIS — G4733 Obstructive sleep apnea (adult) (pediatric): Secondary | ICD-10-CM | POA: Diagnosis not present

## 2022-12-30 ENCOUNTER — Other Ambulatory Visit: Payer: Self-pay | Admitting: Family Medicine

## 2023-01-04 DIAGNOSIS — G4733 Obstructive sleep apnea (adult) (pediatric): Secondary | ICD-10-CM | POA: Diagnosis not present

## 2023-01-07 ENCOUNTER — Encounter: Payer: Self-pay | Admitting: Family Medicine

## 2023-01-07 ENCOUNTER — Ambulatory Visit: Payer: Medicare PPO | Admitting: Family Medicine

## 2023-01-07 VITALS — BP 125/81 | HR 91 | Ht 70.0 in | Wt 301.0 lb

## 2023-01-07 DIAGNOSIS — I10 Essential (primary) hypertension: Secondary | ICD-10-CM | POA: Diagnosis not present

## 2023-01-07 DIAGNOSIS — R4189 Other symptoms and signs involving cognitive functions and awareness: Secondary | ICD-10-CM | POA: Diagnosis not present

## 2023-01-07 DIAGNOSIS — M47816 Spondylosis without myelopathy or radiculopathy, lumbar region: Secondary | ICD-10-CM | POA: Diagnosis not present

## 2023-01-07 DIAGNOSIS — M791 Myalgia, unspecified site: Secondary | ICD-10-CM | POA: Diagnosis not present

## 2023-01-07 DIAGNOSIS — R7303 Prediabetes: Secondary | ICD-10-CM | POA: Diagnosis not present

## 2023-01-07 LAB — POCT GLYCOSYLATED HEMOGLOBIN (HGB A1C): HbA1c, POC (controlled diabetic range): 5.8 % (ref 0.0–7.0)

## 2023-01-07 MED ORDER — ROSUVASTATIN CALCIUM 10 MG PO TABS
10.0000 mg | ORAL_TABLET | Freq: Every day | ORAL | 3 refills | Status: DC
Start: 2023-01-07 — End: 2023-12-29

## 2023-01-07 MED ORDER — MELOXICAM 15 MG PO TABS: ORAL_TABLET | ORAL | 3 refills | Status: DC

## 2023-01-07 NOTE — Assessment & Plan Note (Signed)
He continues to have low back pain.  Status post ablation which only lasted a few days.  Adding meloxicam back on to replace ibuprofen.

## 2023-01-07 NOTE — Assessment & Plan Note (Addendum)
Mild in nature at this time.  No family history of dementia.  Checking B12, TSH and RPR.

## 2023-01-07 NOTE — Patient Instructions (Addendum)
Let's try changing your statin from atorvastatin to crestor.  We'll be in touch with lab results.  Try meloxicam for back pain to replace ibuprofen Follow up in 4 months.

## 2023-01-07 NOTE — Assessment & Plan Note (Signed)
Checking CK levels.  Changing atorvastatin to rosuvastatin.

## 2023-01-07 NOTE — Assessment & Plan Note (Signed)
Blood pressure is well-controlled.  Continue lisinopril with hydrochlorothiazide at current strength.

## 2023-01-07 NOTE — Progress Notes (Signed)
Have been John Morrow - 73 y.o. male MRN [id number]  Date of birth: [birth year]  Subjective Chief Complaint  Patient presents with   Diabetes   Hypertension    HPI John Morrow is a 72 y.o. here today for follow up visit.  He reports he is doing pretty well.  He continues on lisinopril/hydrochlorothiazide for management of hypertension.  Blood pressure remains well-controlled.  No side effects at this time.  He has not had chest pain, shortness of breath, palpitations, headaches or vision changes.    He has noted some cognitive changes.  These are mild but he does find himself to be a little more forgetful.  Also endorses some pain in the thighs and calf muscles.  He has been on atorvastatin for quite some time.  No family history of dementia which he is aware of.  Continues to struggle with back pain.  He did have ablation but this only lasted for about a week.  He is using ibuprofen which does provide some relief.  He has had a prescription for tramadol but did not feel like this was very effective.  ROS:  A comprehensive ROS was completed and negative except as noted per HPI    Allergies  Allergen Reactions   Morphine And Related     Rash, vomiting, itching     Past Medical History:  Diagnosis Date   Allergy    Arthritis    Back pain    Diverticulosis of colon without hemorrhage 06/12/2016   On Colonoscopy 11/06/2010   HLD (hyperlipidemia) 05/29/2016   Hyperlipemia    Hypertension    Joint pain    Morbid obesity 05/29/2016   Prediabetes    Right hip pain    needs hip replacement   Sleep apnea    on CPAP    Past Surgical History:  Procedure Laterality Date   EYE SURGERY     age 34   JOINT REPLACEMENT     Hip   KNEE SURGERY     REPLACEMENT TOTAL HIP W/  RESURFACING IMPLANTS Right 03/2019   Dr. Marcell Barlow    Social History   Socioeconomic History   Marital status: Married    Spouse name: Chip Boer   Number of children: 4   Years of education: 18   Highest  education level: Patent examiner History   Occupation: retired    Comment: Runner, broadcasting/film/video  Tobacco Use   Smoking status: Never   Smokeless tobacco: Never  Building services engineer Use: Never used  Substance and Sexual Activity   Alcohol use: Not Currently    Comment: rarely   Drug use: No   Sexual activity: Not Currently    Partners: Female  Other Topics Concern   Not on file  Social History Narrative   Lives with his wife. 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   Social Determinants of Health   Financial Resource Strain: Low Risk  (01/03/2023)   Overall Financial Resource Strain (CARDIA)    Difficulty of Paying Living Expenses: Not hard at all  Food Insecurity: No Food Insecurity (01/03/2023)   Hunger Vital Sign    Worried About Running Out of Food in the Last Year: Never true    Ran Out of Food in the Last Year: Never true  Transportation Needs: No Transportation Needs (01/03/2023)   PRAPARE - Administrator, Civil Service (Medical): No    Lack of Transportation (Non-Medical): No  Physical  Activity: Insufficiently Active (01/03/2023)   Exercise Vital Sign    Days of Exercise per Week: 2 days    Minutes of Exercise per Session: 20 min  Stress: Stress Concern Present (01/03/2023)   [employer] of Occupational Health - Occupational Stress Questionnaire    Feeling of Stress : To some extent  Social Connections: Socially Integrated (01/03/2023)   Social Connection and Isolation Panel [NHANES]    Frequency of Communication with Friends and Family: Twice a week    Frequency of Social Gatherings with Friends and Family: Twice a week    Attends Religious Services: More than 4 times per year    Active Member of Clubs or Organizations: Yes    Attends Engineer, structural: More than 4 times per year    Marital Status: Married    Family History  Problem Relation Age of Onset   Lung disease Mother     Hypertension Mother    Lung disease Father    Alcoholism Father    Colon cancer Neg Hx    Colon polyps Neg Hx    Esophageal cancer Neg Hx    Stomach cancer Neg Hx    Rectal cancer Neg Hx     Health Maintenance  Topic Date Due   COVID-19 Vaccine (3 - 2023-24 season) 06/25/2023 (Originally 05/16/2022)   INFLUENZA VACCINE  04/16/2023   Medicare Annual Wellness (AWV)  10/03/2023   DTaP/Tdap/Td (2 - Td or Tdap) 05/29/2026   Pneumonia Vaccine 19+ Years old  Completed   Hepatitis C Screening  Completed   Zoster Vaccines- Shingrix  Completed   HPV VACCINES  Aged Out   COLONOSCOPY (Pts 45-67yrs Insurance coverage will need to be confirmed)  Discontinued     ----------------------------------------------------------------------------------------------------------------------------------------------------------------------------------------------------------------- Physical Exam BP 125/81 (BP Location: Left Arm, Patient Position: Sitting, Cuff Size: Large)   Pulse 91   Ht 5\' 10"  (1.778 m)   Wt (!) 301 lb (136.5 kg)   SpO2 98%   BMI 43.19 kg/m   Physical Exam Constitutional:      Appearance: Normal appearance.  HENT:     Head: Normocephalic and atraumatic.  Eyes:     General: No scleral icterus. Cardiovascular:     Rate and Rhythm: Normal rate and regular rhythm.  Pulmonary:     Effort: Pulmonary effort is normal.     Breath sounds: Normal breath sounds.  Musculoskeletal:     Cervical back: Neck supple.  Neurological:     Mental Status: He is alert.  Psychiatric:        Mood and Affect: Mood normal.        Behavior: Behavior normal.     ------------------------------------------------------------------------------------------------------------------------------------------------------------------------------------------------------------------- Assessment and Plan  HTN (hypertension) Blood pressure is well-controlled.  Continue lisinopril with hydrochlorothiazide at  current strength.  Lumbar spondylosis He continues to have low back pain.  Status post ablation which only lasted a few days.  Adding meloxicam back on to replace ibuprofen.  Prediabetes A1c is stable at this time.  Continue to work on dietary efforts as well as increased activity.  Myalgia Checking CK levels.  Changing atorvastatin to rosuvastatin.  Cognitive changes Mild in nature at this time.  No family history of dementia.  Checking B12, TSH and RPR.   Meds ordered this encounter  Medications   rosuvastatin (CRESTOR) 10 MG tablet    Sig: Take 1 tablet (10 mg total) by mouth daily.    Dispense:  90 tablet    Refill:  3   meloxicam (  MOBIC) 15 MG tablet    Sig: Take daily as needed for pain.    Dispense:  30 tablet    Refill:  3    Return in about 4 months (around 05/09/2023) for F/u HTN/Cognitive change/HLD.    This visit occurred during the SARS-CoV-2 public health emergency.  Safety protocols were in place, including screening questions prior to the visit, additional usage of staff PPE, and extensive cleaning of exam room while observing appropriate contact time as indicated for disinfecting solutions.

## 2023-01-07 NOTE — Assessment & Plan Note (Signed)
A1c is stable at this time.  Continue to work on dietary efforts as well as increased activity.

## 2023-01-08 DIAGNOSIS — G4733 Obstructive sleep apnea (adult) (pediatric): Secondary | ICD-10-CM | POA: Diagnosis not present

## 2023-01-08 LAB — RPR: RPR Ser Ql: NONREACTIVE

## 2023-01-08 LAB — CK: Total CK: 93 U/L (ref 44–196)

## 2023-01-08 LAB — TSH: TSH: 1.13 mIU/L (ref 0.40–4.50)

## 2023-01-08 LAB — VITAMIN B12: Vitamin B-12: 452 pg/mL (ref 200–1100)

## 2023-01-19 DIAGNOSIS — M5137 Other intervertebral disc degeneration, lumbosacral region: Secondary | ICD-10-CM | POA: Diagnosis not present

## 2023-01-22 LAB — HM DIABETES EYE EXAM

## 2023-02-03 DIAGNOSIS — G4733 Obstructive sleep apnea (adult) (pediatric): Secondary | ICD-10-CM | POA: Diagnosis not present

## 2023-02-10 ENCOUNTER — Other Ambulatory Visit: Payer: Self-pay | Admitting: Family Medicine

## 2023-02-10 DIAGNOSIS — L570 Actinic keratosis: Secondary | ICD-10-CM | POA: Diagnosis not present

## 2023-02-10 DIAGNOSIS — B079 Viral wart, unspecified: Secondary | ICD-10-CM | POA: Diagnosis not present

## 2023-02-10 DIAGNOSIS — I10 Essential (primary) hypertension: Secondary | ICD-10-CM

## 2023-02-15 ENCOUNTER — Other Ambulatory Visit: Payer: Self-pay | Admitting: Family Medicine

## 2023-02-27 ENCOUNTER — Encounter: Payer: Self-pay | Admitting: Family Medicine

## 2023-03-06 DIAGNOSIS — G4733 Obstructive sleep apnea (adult) (pediatric): Secondary | ICD-10-CM | POA: Diagnosis not present

## 2023-03-13 ENCOUNTER — Other Ambulatory Visit: Payer: Self-pay | Admitting: Family Medicine

## 2023-03-16 DEATH — deceased

## 2023-04-05 DIAGNOSIS — G4733 Obstructive sleep apnea (adult) (pediatric): Secondary | ICD-10-CM | POA: Diagnosis not present

## 2023-04-08 DIAGNOSIS — G4733 Obstructive sleep apnea (adult) (pediatric): Secondary | ICD-10-CM | POA: Diagnosis not present

## 2023-04-30 DIAGNOSIS — M47816 Spondylosis without myelopathy or radiculopathy, lumbar region: Secondary | ICD-10-CM | POA: Diagnosis not present

## 2023-04-30 DIAGNOSIS — Z6841 Body Mass Index (BMI) 40.0 and over, adult: Secondary | ICD-10-CM | POA: Diagnosis not present

## 2023-05-03 ENCOUNTER — Other Ambulatory Visit: Payer: Self-pay | Admitting: Family Medicine

## 2023-05-06 DIAGNOSIS — G4733 Obstructive sleep apnea (adult) (pediatric): Secondary | ICD-10-CM | POA: Diagnosis not present

## 2023-05-12 DIAGNOSIS — C4442 Squamous cell carcinoma of skin of scalp and neck: Secondary | ICD-10-CM | POA: Diagnosis not present

## 2023-05-12 DIAGNOSIS — L570 Actinic keratosis: Secondary | ICD-10-CM | POA: Diagnosis not present

## 2023-05-13 ENCOUNTER — Ambulatory Visit: Payer: Medicare PPO | Admitting: Family Medicine

## 2023-05-13 DIAGNOSIS — M47816 Spondylosis without myelopathy or radiculopathy, lumbar region: Secondary | ICD-10-CM | POA: Diagnosis not present

## 2023-05-19 ENCOUNTER — Encounter: Payer: Self-pay | Admitting: Family Medicine

## 2023-05-19 ENCOUNTER — Ambulatory Visit: Payer: Medicare PPO | Admitting: Family Medicine

## 2023-05-19 VITALS — BP 130/84 | HR 74 | Ht 70.0 in | Wt 301.0 lb

## 2023-05-19 DIAGNOSIS — Z23 Encounter for immunization: Secondary | ICD-10-CM

## 2023-05-19 DIAGNOSIS — N400 Enlarged prostate without lower urinary tract symptoms: Secondary | ICD-10-CM

## 2023-05-19 DIAGNOSIS — R4189 Other symptoms and signs involving cognitive functions and awareness: Secondary | ICD-10-CM

## 2023-05-19 DIAGNOSIS — I10 Essential (primary) hypertension: Secondary | ICD-10-CM

## 2023-05-19 DIAGNOSIS — M47816 Spondylosis without myelopathy or radiculopathy, lumbar region: Secondary | ICD-10-CM

## 2023-05-19 NOTE — Progress Notes (Signed)
John Morrow - 72 y.o. male MRN [id number]  Date of birth: [birth year]  Subjective Chief Complaint  Patient presents with   Hypertension    HPI John Morrow is a 72 y.o. male here today for follow up visit.   Since his last visit he has seen spine surgery for continued management of his chronic low back pain.  He recently had spinal injection but didn't feel that it was very effective.   Has norco prn for pain.  Some constipation for this but nothing too bad.  Remains on lisinopril/hydrochlorothiazide for mgmt of HTN.  Tolerating well.  BP is well controlled.  He denies chest pain, shortness of breath, headaches or vision changes.    He remains concerned about his memory.  Recent labs did not show any abnormalities to suggest reversible causes for cognitive change.  Notices continued forgetfulness.  We discussed referral for further evaluation with neurology however he declines at this time.  ROS:  A comprehensive ROS was completed and negative except as noted per HPI  Allergies  Allergen Reactions   Morphine And Codeine     Rash, vomiting, itching     Past Medical History:  Diagnosis Date   Allergy    Arthritis    Back pain    Diverticulosis of colon without hemorrhage 06/12/2016   On Colonoscopy 11/06/2010   HLD (hyperlipidemia) 05/29/2016   Hyperlipemia    Hypertension    Joint pain    Morbid obesity (HCC) 05/29/2016   Prediabetes    Right hip pain    needs hip replacement   Sleep apnea    on CPAP    Past Surgical History:  Procedure Laterality Date   EYE SURGERY     age 89   JOINT REPLACEMENT     Hip   KNEE SURGERY     REPLACEMENT TOTAL HIP W/  RESURFACING IMPLANTS Right 03/2019   Dr. Marcell Barlow    Social History   Socioeconomic History   Marital status: Married    Spouse name: Chip Boer   Number of children: 4   Years of education: 18   Highest education level: Patent examiner History   Occupation: retired    Comment: Runner, broadcasting/film/video  Tobacco Use    Smoking status: Never   Smokeless tobacco: Never  Vaping Use   Vaping status: Never Used  Substance and Sexual Activity   Alcohol use: Not Currently    Comment: rarely   Drug use: No   Sexual activity: Not Currently    Partners: Female  Other Topics Concern   Not on file  Social History Narrative   Lives with his wife. 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   Social Determinants of Health   Financial Resource Strain: Low Risk  (01/03/2023)   Overall Financial Resource Strain (CARDIA)    Difficulty of Paying Living Expenses: Not hard at all  Food Insecurity: No Food Insecurity (01/03/2023)   Hunger Vital Sign    Worried About Running Out of Food in the Last Year: Never true    Ran Out of Food in the Last Year: Never true  Transportation Needs: No Transportation Needs (01/03/2023)   PRAPARE - Administrator, Civil Service (Medical): No    Lack of Transportation (Non-Medical): No  Physical Activity: Insufficiently Active (01/03/2023)   Exercise Vital Sign    Days of Exercise per Week: 2 days    Minutes of Exercise per Session: 20 min  Stress:  Stress Concern Present (01/03/2023)   [employer] of Occupational Health - Occupational Stress Questionnaire    Feeling of Stress : To some extent  Social Connections: Socially Integrated (01/03/2023)   Social Connection and Isolation Panel [NHANES]    Frequency of Communication with Friends and Family: Twice a week    Frequency of Social Gatherings with Friends and Family: Twice a week    Attends Religious Services: More than 4 times per year    Active Member of [employer] or Organizations: Yes    Attends Engineer, structural: More than 4 times per year    Marital Status: Married    Family History  Problem Relation Age of Onset   Lung disease Mother    Hypertension Mother    Lung disease Father    Alcoholism Father    Colon cancer Neg Hx    Colon polyps Neg Hx     Esophageal cancer Neg Hx    Stomach cancer Neg Hx    Rectal cancer Neg Hx     Health Maintenance  Topic Date Due   Colonoscopy  06/18/2023   COVID-19 Vaccine (3 - 2023-24 season) 08/04/2023 (Originally 05/17/2023)   Medicare Annual Wellness (AWV)  10/03/2023   DTaP/Tdap/Td (2 - Td or Tdap) 05/29/2026   Pneumonia Vaccine 6+ Years old  Completed   INFLUENZA VACCINE  Completed   Hepatitis C Screening  Completed   Zoster Vaccines- Shingrix  Completed   HPV VACCINES  Aged Out     ----------------------------------------------------------------------------------------------------------------------------------------------------------------------------------------------------------------- Physical Exam BP 130/84 (BP Location: Left Arm, Patient Position: Sitting, Cuff Size: Large)   Pulse 74   Ht 5\' 10"  (1.778 m)   Wt (!) 301 lb (136.5 kg)   SpO2 97%   BMI 43.19 kg/m   Physical Exam Constitutional:      Appearance: Normal appearance.  HENT:     Head: Normocephalic and atraumatic.  Eyes:     General: No scleral icterus. Cardiovascular:     Rate and Rhythm: Normal rate and regular rhythm.  Pulmonary:     Effort: Pulmonary effort is normal.     Breath sounds: Normal breath sounds.  Musculoskeletal:     Cervical back: Neck supple.  Neurological:     Mental Status: He is alert.  Psychiatric:        Mood and Affect: Mood normal.        Behavior: Behavior normal.     ------------------------------------------------------------------------------------------------------------------------------------------------------------------------------------------------------------------- Assessment and Plan  Cognitive changes Declines referral to neurology and/or neuropsych testing at this time.  Lumbar spondylosis Continues to have low back pain.  Currently seeing pain management and treated with injections and chronic opioids at this time.  Pain is manageable for now.  HTN  (hypertension) Blood pressure is well-controlled.  Continue lisinopril with hydrochlorothiazide at current strength.  BPH (benign prostatic hyperplasia) Stable lower urinary tract symptoms with tamsulosin.  Recommend continuation.   No orders of the defined types were placed in this encounter.   Return in about 6 months (around 11/16/2023).    This visit occurred during the SARS-CoV-2 public health emergency.  Safety protocols were in place, including screening questions prior to the visit, additional usage of staff PPE, and extensive cleaning of exam room while observing appropriate contact time as indicated for disinfecting solutions.

## 2023-05-19 NOTE — Patient Instructions (Signed)
 Mild Neurocognitive Disorder Mild neurocognitive disorder, formerly known as mild cognitive impairment, is a disorder in which memory does not work as well as it should. This disorder may also cause problems with other mental functions, including thought, communication, behavior, and completion of tasks. These problems can be noticed and measured, but they usually do not interfere with daily activities or the ability to live independently. Mild neurocognitive disorder typically develops after 73 years of age, but it can also develop at younger ages. It is not as serious as major neurocognitive disorder, also known as dementia, but it may be the first sign of it. Generally, symptoms of this condition get worse over time. In rare cases, symptoms can get better. What are the causes? This condition may be caused by: Brain disorders like Alzheimer's disease, Parkinson's disease, and other conditions that gradually damage nerve cells (neurodegenerative conditions). Diseases that affect blood vessels in the brain and result in small strokes. Certain infections, such as HIV. Traumatic brain injury. Other medical conditions, such as brain tumors, underactive thyroid (hypothyroidism), and vitamin B12 deficiency. Use of certain drugs or prescription medicines. What increases the risk? The following factors may make you more likely to develop this condition: Being older than 65 years. Being male. Low education level. Diabetes, high blood pressure, high cholesterol, and other conditions that increase the risk for blood vessel diseases. Untreated or undertreated sleep apnea. Having a certain type of gene that can be passed from parent to child (inherited). Chronic health problems such as heart disease, lung disease, liver disease, kidney disease, or depression. What are the signs or symptoms? Symptoms of this condition include: Difficulty remembering. You may: Forget names, phone numbers, or details of  recent events. Forget social events and appointments. Repeatedly forget where you put your car keys or other items. Difficulty thinking and solving problems. You may have trouble with complex tasks, such as: Paying bills. Driving in unfamiliar places. Difficulty communicating. You may have trouble: Finding the right word or naming an object. Forming a sentence that makes sense, or understanding what you read or hear. Changes in your behavior or personality. When this happens, you may: Lose interest in the things that you used to enjoy. Withdraw from social situations. Get angry more easily than usual. Act before thinking. How is this diagnosed? This condition is diagnosed based on: Your symptoms. Your health care provider may ask you and the people you spend time with, such as family and friends, about your symptoms. Evaluation of mental functions (neuropsychological testing). Your health care provider may refer you to a neurologist or mental health specialist to evaluate your mental functions in detail. To identify the cause of your condition, your health care provider may: Get a detailed medical history. Ask about use of alcohol, drugs, and prescription medicines. Do a physical exam. Order blood tests and brain imaging exams. How is this treated? Mild neurocognitive disorder that is caused by medicine use, drug use, infection, or another medical condition may improve when the cause is treated, or when medicines or drugs are stopped. If this disorder has another cause, it generally does not improve and may get worse. In these cases, the goal of treatment is to help you manage the loss of mental function. Treatments in these cases include: Medicine. Medicine mainly helps memory and behavior symptoms. Talk therapy. Talk therapy provides education, emotional support, memory aids, and other ways of making up for problems with mental function. Lifestyle changes, including: Getting regular  exercise. Eating a healthy diet  that includes omega-3 fatty acids. Challenging your thinking and memory skills. Having more social interaction. Follow these instructions at home: Eating and drinking  Drink enough fluid to keep your urine pale yellow. Eat a healthy diet that includes omega-3 fatty acids. These can be found in: Fish. Nuts. Leafy vegetables. Vegetable oils. If you drink alcohol: Limit how much you use to: 0-1 drink a day for women. 0-2 drinks a day for men. Be aware of how much alcohol is in your drink. In the [place] one drink equals one 12 oz bottle of beer (355 mL), one 5 oz glass of wine (148 mL), or one 1 oz glass of hard liquor (44 mL). Lifestyle  Get regular exercise as told by your health care provider. Do not use any products that contain nicotine or tobacco, such as cigarettes, e-cigarettes, and chewing tobacco. If you need help quitting, ask your health care provider. Practice ways to manage stress. If you need help managing stress, ask your health care provider. Continue to have social interaction. Keep your mind active with stimulating activities you enjoy, such as reading or playing games. Make sure to get quality sleep. Follow these tips: Avoid napping during the day. Keep your sleeping area dark and cool. Avoid exercising during the few hours before you go to bed. Avoid caffeine products in the evening. General instructions Take over-the-counter and prescription medicines only as told by your health care provider. Your health care provider may recommend that you avoid taking medicines that can affect thinking, such as pain medicines or sleep medicines. Work with your health care provider to find out what you need help with and what your safety needs are. Keep all follow-up visits. This is important. Where to find more information [employer] on Aging: https://walker.com/ Contact a health care provider if: You have any new symptoms. Get help right  away if: You develop new confusion or your confusion gets worse. You act in ways that place you or your family in danger. Summary Mild neurocognitive disorder is a disorder in which memory does not work as well as it should. Mild neurocognitive disorder can have many causes. It may be the first stage of dementia. To manage your condition, get regular exercise, keep your mind active, get quality sleep, and eat a healthy diet. This information is not intended to replace advice given to you by your health care provider. Make sure you discuss any questions you have with your health care provider. Document Revised: 01/16/2020 Document Reviewed: 01/16/2020 Elsevier Patient Education  2024 [employer].

## 2023-05-24 NOTE — Assessment & Plan Note (Signed)
Declines referral to neurology and/or neuropsych testing at this time.

## 2023-05-24 NOTE — Assessment & Plan Note (Signed)
Blood pressure is well-controlled.  Continue lisinopril with hydrochlorothiazide at current strength.

## 2023-05-24 NOTE — Assessment & Plan Note (Signed)
Stable lower urinary tract symptoms with tamsulosin.  Recommend continuation.

## 2023-05-24 NOTE — Assessment & Plan Note (Signed)
Continues to have low back pain.  Currently seeing pain management and treated with injections and chronic opioids at this time.  Pain is manageable for now.

## 2023-06-05 ENCOUNTER — Other Ambulatory Visit: Payer: Self-pay | Admitting: Family Medicine

## 2023-06-06 DIAGNOSIS — G4733 Obstructive sleep apnea (adult) (pediatric): Secondary | ICD-10-CM | POA: Diagnosis not present

## 2023-06-10 DIAGNOSIS — M5137 Other intervertebral disc degeneration, lumbosacral region: Secondary | ICD-10-CM | POA: Diagnosis not present

## 2023-06-10 DIAGNOSIS — Z6841 Body Mass Index (BMI) 40.0 and over, adult: Secondary | ICD-10-CM | POA: Diagnosis not present

## 2023-06-10 DIAGNOSIS — M47816 Spondylosis without myelopathy or radiculopathy, lumbar region: Secondary | ICD-10-CM | POA: Diagnosis not present

## 2023-06-16 NOTE — Therapy (Unsigned)
OUTPATIENT PHYSICAL THERAPY THORACOLUMBAR EVALUATION   Patient Name: John Morrow MRN: [id number] DOB:October 24, 1950, 72 y.o., male Today's Date: 06/17/2023  END OF SESSION:  PT End of Session - 06/17/23 1309     Visit Number 1    Number of Visits 24    Date for PT Re-Evaluation 09/10/23    Authorization Type humana state plan copay $20    Progress Note Due on Visit 10    PT Start Time 1146    PT Stop Time 1235    PT Time Calculation (min) 49 min    Activity Tolerance Patient tolerated treatment well             Past Medical History:  Diagnosis Date   Allergy    Arthritis    Back pain    Diverticulosis of colon without hemorrhage 06/12/2016   On Colonoscopy 11/06/2010   HLD (hyperlipidemia) 05/29/2016   Hyperlipemia    Hypertension    Joint pain    Morbid obesity (HCC) 05/29/2016   Prediabetes    Right hip pain    needs hip replacement   Sleep apnea    on CPAP   Past Surgical History:  Procedure Laterality Date   EYE SURGERY     age 82   JOINT REPLACEMENT     Hip   KNEE SURGERY     REPLACEMENT TOTAL HIP W/  RESURFACING IMPLANTS Right 03/2019   Dr. Marcell Barlow   Patient Active Problem List   Diagnosis Date Noted   Myalgia 01/07/2023   Cognitive changes 01/07/2023   COVID-19 09/02/2022   Abnormal CT scan of head 09/02/2022   Lumbar radiculopathy 07/16/2022   Acute pancreatitis 04/07/2022   OSA (obstructive sleep apnea) 02/19/2022   Gout 12/24/2021   Chronic rhinitis 08/21/2021   Impotence of organic origin 02/13/2021   Lumbar spondylosis 10/09/2020   Pre-operative exam 07/18/2020   Verruca 05/01/2020   Epiploic appendagitis 02/20/2020   Bilateral shoulder region arthritis 04/13/2019   Vitamin D deficiency 02/08/2019   Insulin resistance 02/08/2019   Class 2 severe obesity with serious comorbidity and body mass index (BMI) of 39.0 to 39.9 in adult (HCC) 02/08/2019   Prediabetes 02/08/2019   Chronic pain 09/28/2018   Hip pain 06/28/2018   Dependent  edema 05/05/2017   Primary osteoarthritis of left knee 09/25/2016   BPH (benign prostatic hyperplasia) 05/29/2016   HTN (hypertension) 05/29/2016   HLD (hyperlipidemia) 05/29/2016   Class 2 obesity due to excess calories without serious comorbidity with body mass index (BMI) of 39.0 to 39.9 in adult 05/29/2016   Sleep apnea 03/31/2014    PCP: Dr Everrett Coombe   REFERRING PROVIDER: Clent Jacks Crist Infante, PA-C  REFERRING DIAG: Spondylosis without myelopathy or radiculopathy   Rationale for Evaluation and Treatment: Rehabilitation  THERAPY DIAG:  Other low back pain  Other symptoms and signs involving the musculoskeletal system  Muscle weakness (generalized)  ONSET DATE: 09/15/22; history of LBP ~ 6 years   SUBJECTIVE:  SUBJECTIVE STATEMENT: Patient reports that he has continued to have back pain since he was last in Physical Therapy. He has seen neurologist and received several injections including an ablation with no change in pain.   PERTINENT HISTORY:  HTN; hyperlipemia; Rt THA; obesity; back pain; prediabetes; sleep apnea  PAIN:  Are you having pain? Yes: NPRS scale: 4-5/10 Pain location: low back R > L  Pain description: sharp and dull  Aggravating factors: getting up from sitting or lying down; lifting less than 10 # Relieving factors: meds;   PRECAUTIONS: None  RED FLAGS: None   WEIGHT BEARING RESTRICTIONS: No  FALLS:  Has patient fallen in last 6 months? 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  DIAGNOSTIC FINDINGS:  MRI 07/17/23: 1. Grade 1 retrolisthesis of L1 on L2 and L2 on L3 with associated disc degeneration and bulge at L2-L3 resulting in mild spinal canal stenosis with bilateral subarticular zone narrowing and possible irritation of either traversing L3 nerve root, and moderate bilateral neural foraminal stenosis. 2. Moderate left worse than right facet arthropathy at L4-L5 without significant spinal canal or neural foraminal stenosis. 3. Disc degeneration at L5-S1 with left foraminal and right subarticular zone protrusions resulting in mild left neural foraminal stenosis with possible contact of the exiting L5 nerve root, and right subarticular zone narrowing with possible contact of the traversing right S1 nerve root.  PATIENT SURVEYS:  FOTO   SCREENING FOR RED FLAGS: Bowel or bladder incontinence: No Spinal tumors: No Cauda equina syndrome: No Compression fracture: No Abdominal aneurysm: No  COGNITION: Overall cognitive status: Within functional limits for tasks assessed     SENSATION: WFL  MUSCLE LENGTH: Hamstrings: Right 45 deg; Left 50 deg Thomas test: approximately - Right -30 deg; Left -25 deg  POSTURE: rounded shoulders, forward head, decreased lumbar lordosis, increased thoracic kyphosis, and extremely flexed trunk   PALPATION: Muscular tightness R > L hip flexors and R lumbar paraspinals   LUMBAR ROM:   AROM eval  Flexion 70% pulling   Extension 30%  Right lateral flexion 65% discomfort   Left lateral flexion 60%   Right rotation 20%  Left rotation 20%   (Blank rows = not tested)  LOWER EXTREMITY ROM:   tight R hip in all planes; bilat LE's limited ROM due to patient obesity    Approximate limitations in hip extension   Active  Right eval Left eval  Hip flexion    Hip extension -30 -25  Hip abduction    Hip adduction    Hip  internal rotation    Hip external rotation    Knee flexion    Knee extension    Ankle dorsiflexion    Ankle plantarflexion    Ankle inversion    Ankle eversion     (Blank rows = not tested)  LOWER EXTREMITY MMT:    MMT Right eval Left eval  Hip flexion 4 4+  Hip extension 4- 4-  Hip abduction 4 4  Hip adduction    Hip internal rotation    Hip external rotation    Knee flexion 5 5  Knee extension 5 5  Ankle dorsiflexion    Ankle plantarflexion    Ankle inversion    Ankle eversion     (Blank rows = not tested)  LUMBAR SPECIAL TESTS:  Straight leg raise test: Negative and Slump test: Negative SLS - unable to stand on one leg without UE support; 10 sec with UE support   FUNCTIONAL TESTS:  5 times sit to stand: 12.98  GAIT: Distance walked: 40 ft Assistive device utilized: None Level of assistance: Complete Independence Comments: forward flexed posture through trunk and hips bilat; poor trunk stability with lateral shift of upper body opposite to wt shift   [facility] Adult PT Treatment:                                                DATE: 06/17/23 Therapeutic Exercise: Standing Back to wall lifting hips away from wall 10 sec hold x 5   Supine  Piriformis 30 sec x 3 R/L Hamstring with strap 30 sec x 2 R/L  Hip flexor 30 sec x 2 R/L    PATIENT EDUCATION:  Education details: POC; HEP  Person educated: Patient Education method: Programmer, multimedia, Demonstration, Tactile cues, Verbal cues, and Handouts Education comprehension: verbalized understanding, returned demonstration, verbal cues required, tactile cues required, and needs further education  HOME EXERCISE PROGRAM: Access Code: [email or web address] URL: [email or web address] Date: 06/17/2023 Prepared by: Corlis Leak  Exercises - Supine Piriformis Stretch with Leg Straight  - 2 x daily - 7 x weekly - 1 sets - 3 reps - 30 sec  hold - Hooklying Hamstring Stretch with Strap  - 2 x daily - 7 x weekly - 1 sets - 3 reps -  30 sec  hold - Hip Flexor Stretch at Edge of Bed  - 2 x daily - 7 x weekly - 1 sets - 3 reps - 30 sec  hold - Correct Standing Posture  - 2 x daily - 7 x weekly - 1 sets - 3 reps - 30 sec  hold  ASSESSMENT:  CLINICAL IMPRESSION: Patient is a 72 y.o. male who was seen today for physical therapy evaluation and treatment for lumbar spondylosis. Patient has a history of LBP over the past several years. He was treated by neurologist with injection and ablation with no change in symptoms. Patient presents with chronic R > L LBP; poor posture and alignment; limited trunk ROM/mobility; LE and core weakness; pain limiting functional activities and ADL's. Patient will benefit from PT to address problems identified.     OBJECTIVE IMPAIRMENTS: Abnormal gait, decreased activity tolerance, decreased balance, decreased mobility, difficulty walking, decreased ROM, decreased strength, hypomobility, increased fascial restrictions, impaired flexibility, improper body mechanics, postural dysfunction, and pain.   ACTIVITY LIMITATIONS: carrying, lifting, bending, sitting, standing, squatting, sleeping, stairs, and locomotion level  PARTICIPATION LIMITATIONS: driving, shopping, community activity, occupation, and yard work  PERSONAL FACTORS: Age, Behavior pattern, Fitness, Past/current experiences, and Time since onset of injury/illness/exacerbation are also affecting patient's functional outcome.   REHAB POTENTIAL: Good  CLINICAL DECISION MAKING: Stable/uncomplicated  EVALUATION COMPLEXITY: Low   GOALS: Goals reviewed with patient? Yes  SHORT TERM GOALS: Target date: 07/29/2023   Independent in initial HEP Baseline: Goal status: INITIAL  2.  Improve hip extension with patient to demonstrate improve upright posture and alignment.  Baseline:  Goal status: INITIAL   LONG TERM GOALS: Target date: 09/09/2023   Increase LE  strength to 4/5 to 5/5 througout Baseline:  Goal status: INITIAL  2.   Improve bilat hip extension to neutral  Baseline:  Goal status: INITIAL  3.  Decrease LBP by 50-70% allowing patient to increase functional activity level  Baseline:  Goal status: INITIAL  4.  Decrease 5 times sit to stand to 10 seconds  Baseline: 12.98 Goal status: INITIAL  5.  Independent in HEP, including aquatic program as indicated  Baseline:  Goal status: INITIAL  6.  Improve functional limitation score to 53 Baseline: 40 Goal status: INITIAL  PLAN:  PT FREQUENCY: 2x/week  PT DURATION: 12 weeks  PLANNED INTERVENTIONS: Therapeutic exercises, Therapeutic activity, Neuromuscular re-education, Balance training, Gait training, Patient/Family education, Self Care, Joint mobilization, Stair training, Aquatic Therapy, Dry Needling, Electrical stimulation, Spinal mobilization, Cryotherapy, Moist heat, Taping, Traction, Ultrasound, Ionotophoresis 4mg /ml Dexamethasone, Manual therapy, and Re-evaluation.  PLAN FOR NEXT SESSION: review and progress HEP; continue back care and body mechanics education; manual work, DN, modalities as indicated    [employer], PT 06/17/2023, 3:22 PM

## 2023-06-17 ENCOUNTER — Ambulatory Visit: Payer: Medicare PPO | Attending: Neurosurgery | Admitting: Rehabilitative and Restorative Service Providers"

## 2023-06-17 ENCOUNTER — Other Ambulatory Visit: Payer: Self-pay

## 2023-06-17 ENCOUNTER — Encounter: Payer: Self-pay | Admitting: Rehabilitative and Restorative Service Providers"

## 2023-06-17 DIAGNOSIS — M5459 Other low back pain: Secondary | ICD-10-CM | POA: Insufficient documentation

## 2023-06-17 DIAGNOSIS — R898 Other abnormal findings in specimens from other organs, systems and tissues: Secondary | ICD-10-CM | POA: Diagnosis not present

## 2023-06-17 DIAGNOSIS — M47816 Spondylosis without myelopathy or radiculopathy, lumbar region: Secondary | ICD-10-CM | POA: Insufficient documentation

## 2023-06-17 DIAGNOSIS — M6281 Muscle weakness (generalized): Secondary | ICD-10-CM | POA: Diagnosis not present

## 2023-06-17 DIAGNOSIS — R29898 Other symptoms and signs involving the musculoskeletal system: Secondary | ICD-10-CM

## 2023-06-23 ENCOUNTER — Encounter: Payer: Self-pay | Admitting: Rehabilitative and Restorative Service Providers"

## 2023-06-23 ENCOUNTER — Ambulatory Visit: Payer: Medicare PPO | Admitting: Rehabilitative and Restorative Service Providers"

## 2023-06-23 DIAGNOSIS — R29898 Other symptoms and signs involving the musculoskeletal system: Secondary | ICD-10-CM

## 2023-06-23 DIAGNOSIS — M5459 Other low back pain: Secondary | ICD-10-CM

## 2023-06-23 DIAGNOSIS — R898 Other abnormal findings in specimens from other organs, systems and tissues: Secondary | ICD-10-CM | POA: Diagnosis not present

## 2023-06-23 DIAGNOSIS — M6281 Muscle weakness (generalized): Secondary | ICD-10-CM | POA: Diagnosis not present

## 2023-06-23 DIAGNOSIS — M47816 Spondylosis without myelopathy or radiculopathy, lumbar region: Secondary | ICD-10-CM | POA: Diagnosis not present

## 2023-06-23 NOTE — Therapy (Signed)
OUTPATIENT PHYSICAL THERAPY THORACOLUMBAR EVALUATION   Patient Name: John Morrow MRN: [id number] DOB:[birth year], 72 y.o., male Today's Date: 06/23/2023  END OF SESSION:  PT End of Session - 06/23/23 0931     Visit Number 2    Number of Visits 24    Date for PT Re-Evaluation 09/10/23    Authorization Type [employer] copay $20    Progress Note Due on Visit 10    PT Start Time 0929    PT Stop Time 1020    PT Time Calculation (min) 51 min    Activity Tolerance Patient tolerated treatment well             Past Medical History:  Diagnosis Date   Allergy    Arthritis    Back pain    Diverticulosis of colon without hemorrhage 06/12/2016   On Colonoscopy 11/06/2010   HLD (hyperlipidemia) 05/29/2016   Hyperlipemia    Hypertension    Joint pain    Morbid obesity (HCC) 05/29/2016   Prediabetes    Right hip pain    needs hip replacement   Sleep apnea    on CPAP   Past Surgical History:  Procedure Laterality Date   EYE SURGERY     age 21   JOINT REPLACEMENT     Hip   KNEE SURGERY     REPLACEMENT TOTAL HIP W/  RESURFACING IMPLANTS Right 03/2019   Dr. Marcell Barlow   Patient Active Problem List   Diagnosis Date Noted   Myalgia 01/07/2023   Cognitive changes 01/07/2023   COVID-19 09/02/2022   Abnormal CT scan of head 09/02/2022   Lumbar radiculopathy 07/16/2022   Acute pancreatitis 04/07/2022   OSA (obstructive sleep apnea) 02/19/2022   Gout 12/24/2021   Chronic rhinitis 08/21/2021   Impotence of organic origin 02/13/2021   Lumbar spondylosis 10/09/2020   Pre-operative exam 07/18/2020   Verruca 05/01/2020   Epiploic appendagitis 02/20/2020   Bilateral shoulder region arthritis 04/13/2019   Vitamin D deficiency 02/08/2019   Insulin resistance 02/08/2019   Class 2 severe obesity with serious comorbidity and body mass index (BMI) of 39.0 to 39.9 in adult (HCC) 02/08/2019   Prediabetes 02/08/2019   Chronic pain 09/28/2018   Hip pain 06/28/2018   Dependent  edema 05/05/2017   Primary osteoarthritis of left knee 09/25/2016   BPH (benign prostatic hyperplasia) 05/29/2016   HTN (hypertension) 05/29/2016   HLD (hyperlipidemia) 05/29/2016   Class 2 obesity due to excess calories without serious comorbidity with body mass index (BMI) of 39.0 to 39.9 in adult 05/29/2016   Sleep apnea 03/31/2014    PCP: Dr Everrett Coombe   REFERRING PROVIDER: Clent Jacks Crist Infante, PA-C  REFERRING DIAG: Spondylosis without myelopathy or radiculopathy   Rationale for Evaluation and Treatment: Rehabilitation  THERAPY DIAG:  Other low back pain  Other symptoms and signs involving the musculoskeletal system  Muscle weakness (generalized)  ONSET DATE: 09/15/22; history of LBP ~ 6 years   SUBJECTIVE:  SUBJECTIVE STATEMENT: Patient reports that he has continued to have back pain since he was last in Physical Therapy. He has seen neurologist and received several injections including an ablation with no change in pain.   PERTINENT HISTORY:  HTN; hyperlipemia; Rt THA; obesity; back pain; prediabetes; sleep apnea  PAIN:  Are you having pain? Yes: NPRS scale: 3-4/10 Pain location: low back R > L  Pain description: sharp and dull  Aggravating factors: getting up from sitting or lying down; lifting less than 10 # Relieving factors: meds;   PRECAUTIONS: None   WEIGHT BEARING RESTRICTIONS: No  FALLS:  Has patient fallen in last 6 months? 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  DIAGNOSTIC FINDINGS:  MRI 07/17/23: 1. Grade 1 retrolisthesis of L1 on L2 and L2 on L3 with associated disc degeneration and bulge at L2-L3 resulting in mild spinal canal stenosis with bilateral subarticular zone narrowing and possible irritation of either traversing L3 nerve root, and moderate bilateral neural foraminal stenosis. 2. Moderate left worse than right facet arthropathy at L4-L5 without significant spinal canal or neural foraminal stenosis. 3. Disc degeneration at L5-S1 with left foraminal and right subarticular zone protrusions resulting in mild left neural foraminal stenosis with possible contact of the exiting L5 nerve root, and right subarticular zone narrowing with possible contact of the traversing right S1 nerve root.   MUSCLE LENGTH: Hamstrings: Right 45 deg; Left 50 deg Thomas test: approximately - Right -30 deg; Left -25 deg  POSTURE: rounded shoulders, forward head, decreased lumbar lordosis, increased thoracic kyphosis, and extremely flexed trunk   PALPATION: Muscular tightness R > L hip flexors and R lumbar paraspinals   LUMBAR ROM:   AROM eval  Flexion 70% pulling   Extension 30%  Right lateral flexion 65% discomfort   Left lateral flexion 60%   Right rotation 20%  Left rotation 20%   (Blank rows = not tested)  LOWER EXTREMITY ROM:   tight R hip in all planes; bilat LE's limited ROM due to patient obesity    Approximate limitations in hip extension   Active  Right eval Left eval  Hip flexion    Hip extension -30 -25  Hip abduction    Hip adduction    Hip internal rotation    Hip external rotation    Knee flexion    Knee extension    Ankle dorsiflexion    Ankle plantarflexion    Ankle inversion    Ankle eversion     (Blank rows = not tested)  LOWER EXTREMITY MMT:    MMT Right eval Left eval  Hip flexion 4 4+  Hip extension 4- 4-  Hip abduction 4 4  Hip  adduction    Hip internal rotation    Hip external rotation    Knee flexion 5 5  Knee extension 5 5  Ankle dorsiflexion    Ankle plantarflexion    Ankle inversion    Ankle eversion     (Blank rows = not tested)  LUMBAR SPECIAL TESTS:  Straight leg raise test: Negative and Slump test: Negative SLS - unable to stand on one leg without UE  support; 10 sec with UE support   FUNCTIONAL TESTS:  5 times sit to stand: 12.98  GAIT: Distance walked: 40 ft Assistive device utilized: None Level of assistance: Complete Independence Comments: forward flexed posture through trunk and hips bilat; poor trunk stability with lateral shift of upper body opposite to wt shift   [facility] Adult PT Treatment:                                                DATE: 06/23/23 Therapeutic Exercise: Treadmill 1.2 mph x 4 in fwd; then .5 mph x 2 min walking backward working on posture and alignment.  Standing Back to wall lifting hips away from wall 10 sec hold x 5   Hip extension 5 sec x 10 R/L  Hip abduction leading with heel x 10 R/L  Sitting  Hip flexor stretch 30 sec x 3 R/L  Prone  Lying prone ~ 3-4 min Glut set 10 sec x 10  Supine (Home Program) Piriformis 30 sec x 3 R/L Hamstring with strap 30 sec x 2 R/L  Hip flexor 30 sec x 2 R/L    [facility] Adult PT Treatment:                                                DATE: 06/17/23 Therapeutic Exercise: Standing Back to wall lifting hips away from wall 10 sec hold x 5   Supine  Piriformis 30 sec x 3 R/L Hamstring with strap 30 sec x 2 R/L  Hip flexor 30 sec x 2 R/L  Modalities:   Moist heat - lumbar spine x 10 min   PATIENT EDUCATION:  Education details: POC; HEP  Person educated: Patient Education method: Programmer, multimedia, Demonstration, Tactile cues, Verbal cues, and Handouts Education comprehension: verbalized understanding, returned demonstration, verbal cues required, tactile cues required, and needs further education  HOME EXERCISE PROGRAM: Access  Code: [email or web address] URL: [email or web address] Date: 06/23/2023 Prepared by: Corlis Leak  Exercises - Supine Piriformis Stretch with Leg Straight  - 2 x daily - 7 x weekly - 1 sets - 3 reps - 30 sec  hold - Hooklying Hamstring Stretch with Strap  - 2 x daily - 7 x weekly - 1 sets - 3 reps - 30 sec  hold - Hip Flexor Stretch at Edge of Bed  - 2 x daily - 7 x weekly - 1 sets - 3 reps - 30 sec  hold - Correct Standing Posture  - 2 x daily - 7 x weekly - 1 sets - 3 reps - 30 sec  hold - Seated Hip Flexor Stretch  - 2 x daily - 7 x weekly - 1 sets - 3 reps - 30 sec  hold - Standing Hip Extension with Counter Support  - 2 x daily - 7 x weekly - 1-2 sets - 10 reps - 3-5 sec  hold - Standing Hip Abduction with Unilateral Counter Support  - 2 x daily - 7 x weekly - 1 sets - 10 reps - 3-5 sec  hold - Standing Hip Flexor Stretch  - 2 x daily - 7 x weekly - 1 sets - 3-5 reps - 10 sec  hold  ASSESSMENT:  CLINICAL IMPRESSION: Patient reports that  he has been working on exercises at home. Reviewed and progressed exercises. Will add backward walking for home along a hallway or counter. Encouraged frequent standing stretch during the day.    EVAL: Patient is a 72 y.o. male who was seen today for physical therapy evaluation and treatment for lumbar spondylosis. Patient has a history of LBP over the past several years. He was treated by neurologist with injection and ablation with no change in symptoms. Patient presents with chronic R > L LBP; poor posture and alignment; limited trunk ROM/mobility; LE and core weakness; pain limiting functional activities and ADL's. Patient will benefit from PT to address problems identified.     OBJECTIVE IMPAIRMENTS: Abnormal gait, decreased activity tolerance, decreased balance, decreased mobility, difficulty walking, decreased ROM, decreased strength, hypomobility, increased fascial restrictions, impaired flexibility, improper body mechanics, postural dysfunction,  and pain.    GOALS: Goals reviewed with patient? Yes  SHORT TERM GOALS: Target date: 07/29/2023   Independent in initial HEP Baseline: Goal status: INITIAL  2.  Improve hip extension with patient to demonstrate improve upright posture and alignment.  Baseline:  Goal status: INITIAL   LONG TERM GOALS: Target date: 09/09/2023   Increase LE strength to 4/5 to 5/5 througout Baseline:  Goal status: INITIAL  2.  Improve bilat hip extension to neutral  Baseline:  Goal status: INITIAL  3.  Decrease LBP by 50-70% allowing patient to increase functional activity level  Baseline:  Goal status: INITIAL  4.  Decrease 5 times sit to stand to 10 seconds  Baseline: 12.98 Goal status: INITIAL  5.  Independent in HEP, including aquatic program as indicated  Baseline:  Goal status: INITIAL  6.  Improve functional limitation score to 53 Baseline: 40 Goal status: INITIAL  PLAN:  PT FREQUENCY: 2x/week  PT DURATION: 12 weeks  PLANNED INTERVENTIONS: Therapeutic exercises, Therapeutic activity, Neuromuscular re-education, Balance training, Gait training, Patient/Family education, Self Care, Joint mobilization, Stair training, Aquatic Therapy, Dry Needling, Electrical stimulation, Spinal mobilization, Cryotherapy, Moist heat, Taping, Traction, Ultrasound, Ionotophoresis 4mg /ml Dexamethasone, Manual therapy, and Re-evaluation.  PLAN FOR NEXT SESSION: review and progress HEP; continue back care and body mechanics education; manual work, DN, modalities as indicated    [employer], PT 06/23/2023, 10:14 AM

## 2023-06-25 ENCOUNTER — Ambulatory Visit: Payer: Medicare PPO | Admitting: Rehabilitative and Restorative Service Providers"

## 2023-06-25 ENCOUNTER — Encounter: Payer: Self-pay | Admitting: Rehabilitative and Restorative Service Providers"

## 2023-06-25 DIAGNOSIS — M5459 Other low back pain: Secondary | ICD-10-CM | POA: Diagnosis not present

## 2023-06-25 DIAGNOSIS — M6281 Muscle weakness (generalized): Secondary | ICD-10-CM | POA: Diagnosis not present

## 2023-06-25 DIAGNOSIS — R898 Other abnormal findings in specimens from other organs, systems and tissues: Secondary | ICD-10-CM | POA: Diagnosis not present

## 2023-06-25 DIAGNOSIS — M47816 Spondylosis without myelopathy or radiculopathy, lumbar region: Secondary | ICD-10-CM | POA: Diagnosis not present

## 2023-06-25 DIAGNOSIS — R29898 Other symptoms and signs involving the musculoskeletal system: Secondary | ICD-10-CM

## 2023-06-25 NOTE — Therapy (Signed)
OUTPATIENT PHYSICAL THERAPY THORACOLUMBAR EVALUATION   Patient Name: John Morrow MRN: [id number] DOB:1951-05-18, 72 y.o., male Today's Date: 06/25/2023  END OF SESSION:  PT End of Session - 06/25/23 1306     Visit Number 3    Number of Visits 24    Date for PT Re-Evaluation 09/10/23    Authorization Type [employer] copay $20    Progress Note Due on Visit 10    PT Start Time 1307    PT Stop Time 1355    PT Time Calculation (min) 48 min    Activity Tolerance Patient tolerated treatment well             Past Medical History:  Diagnosis Date   Allergy    Arthritis    Back pain    Diverticulosis of colon without hemorrhage 06/12/2016   On Colonoscopy 11/06/2010   HLD (hyperlipidemia) 05/29/2016   Hyperlipemia    Hypertension    Joint pain    Morbid obesity (HCC) 05/29/2016   Prediabetes    Right hip pain    needs hip replacement   Sleep apnea    on CPAP   Past Surgical History:  Procedure Laterality Date   EYE SURGERY     age 43   JOINT REPLACEMENT     Hip   KNEE SURGERY     REPLACEMENT TOTAL HIP W/  RESURFACING IMPLANTS Right 03/2019   Dr. Marcell Barlow   Patient Active Problem List   Diagnosis Date Noted   Myalgia 01/07/2023   Cognitive changes 01/07/2023   COVID-19 09/02/2022   Abnormal CT scan of head 09/02/2022   Lumbar radiculopathy 07/16/2022   Acute pancreatitis 04/07/2022   OSA (obstructive sleep apnea) 02/19/2022   Gout 12/24/2021   Chronic rhinitis 08/21/2021   Impotence of organic origin 02/13/2021   Lumbar spondylosis 10/09/2020   Pre-operative exam 07/18/2020   Verruca 05/01/2020   Epiploic appendagitis 02/20/2020   Bilateral shoulder region arthritis 04/13/2019   Vitamin D deficiency 02/08/2019   Insulin resistance 02/08/2019   Class 2 severe obesity with serious comorbidity and body mass index (BMI) of 39.0 to 39.9 in adult (HCC) 02/08/2019   Prediabetes 02/08/2019   Chronic pain 09/28/2018   Hip pain 06/28/2018   Dependent  edema 05/05/2017   Primary osteoarthritis of left knee 09/25/2016   BPH (benign prostatic hyperplasia) 05/29/2016   HTN (hypertension) 05/29/2016   HLD (hyperlipidemia) 05/29/2016   Class 2 obesity due to excess calories without serious comorbidity with body mass index (BMI) of 39.0 to 39.9 in adult 05/29/2016   Sleep apnea 03/31/2014    PCP: Dr Everrett Coombe   REFERRING PROVIDER: Clent Jacks Crist Infante, PA-C  REFERRING DIAG: Spondylosis without myelopathy or radiculopathy   Rationale for Evaluation and Treatment: Rehabilitation  THERAPY DIAG:  Other low back pain  Other symptoms and signs involving the musculoskeletal system  Muscle weakness (generalized)  ONSET DATE: 09/15/22; history of LBP ~ 6 years   SUBJECTIVE:  SUBJECTIVE STATEMENT: Patient reports that he went to the fitness center and the back extension and flexion machines seem to help. He has been doing "a lot of stretching" at home.   EVAL: Patient reports that he has continued to have back pain since he was last in Physical Therapy. He has seen neurologist and received several injections including an ablation with no change in pain.   PERTINENT HISTORY:  HTN; hyperlipemia; Rt THA; obesity; back pain; prediabetes; sleep apnea  PAIN:  Are you having pain? Yes: NPRS scale: 2-3/10 Pain location: low back R > L  Pain description: sharp and dull  Aggravating factors: getting up from sitting or lying down; lifting less than 10 # Relieving factors: meds;   PRECAUTIONS: None   WEIGHT BEARING RESTRICTIONS: No  FALLS:  Has patient fallen in last 6 months? 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  DIAGNOSTIC FINDINGS:  MRI 07/17/23: 1. Grade 1 retrolisthesis of L1 on L2 and L2 on L3 with associated disc degeneration and bulge at L2-L3 resulting in mild spinal canal stenosis with bilateral subarticular zone narrowing and possible irritation of either traversing L3 nerve root, and moderate bilateral neural foraminal stenosis. 2. Moderate left worse than right facet arthropathy at L4-L5 without significant spinal canal or neural foraminal stenosis. 3. Disc degeneration at L5-S1 with left foraminal and right subarticular zone protrusions resulting in mild left neural foraminal stenosis with possible contact of the exiting L5 nerve root, and right subarticular zone narrowing with possible contact of the traversing right S1 nerve root.   MUSCLE LENGTH: Hamstrings: Right 45 deg; Left 50 deg Thomas test: approximately - Right -30 deg; Left -25 deg  POSTURE: rounded shoulders, forward head, decreased lumbar lordosis, increased thoracic kyphosis, and extremely flexed trunk   PALPATION: Muscular tightness R > L hip flexors and R lumbar paraspinals   LUMBAR ROM:   AROM eval  Flexion 70% pulling   Extension 30%  Right lateral flexion 65% discomfort   Left lateral flexion 60%   Right rotation 20%  Left rotation 20%   (Blank rows = not tested)  LOWER EXTREMITY ROM:   tight R hip in all planes; bilat LE's limited ROM due to patient obesity    Approximate limitations in hip extension   Active  Right eval Left eval  Hip flexion    Hip extension -30 -25  Hip abduction    Hip adduction    Hip internal rotation    Hip external rotation    Knee flexion    Knee extension    Ankle dorsiflexion    Ankle plantarflexion    Ankle inversion    Ankle  eversion     (Blank rows = not tested)  LOWER EXTREMITY MMT:    MMT Right eval Left eval  Hip flexion 4 4+  Hip extension 4- 4-  Hip abduction 4 4  Hip adduction    Hip internal rotation    Hip external rotation    Knee flexion 5 5  Knee extension 5 5  Ankle dorsiflexion    Ankle plantarflexion    Ankle inversion    Ankle eversion     (  Blank rows = not tested)  LUMBAR SPECIAL TESTS:  Straight leg raise test: Negative and Slump test: Negative SLS - unable to stand on one leg without UE support; 10 sec with UE support   FUNCTIONAL TESTS:  5 times sit to stand: 12.98  GAIT: Distance walked: 40 ft Assistive device utilized: None Level of assistance: Complete Independence Comments: forward flexed posture through trunk and hips bilat; poor trunk stability with lateral shift of upper body opposite to wt shift    [facility] Adult PT Treatment:                                                DATE: 06/25/23 Therapeutic Exercise: Treadmill .5 mph x 2 min walking backward working on posture and alignment.  Standing Back to wall lifting hips away from wall 10 sec hold x 5   Hip extension 5 sec x 10 x R/L  Hip abduction leading with heel x 10 R/L  Partial lunge on 12 inch step 20 sec x 5 R/L Slider hip extension 3 sec x 10 R/L  Backward step up 4 in step x 10 x 2 R/L  Sitting  Hip flexor stretch 30 sec x 3 R/L  Prone  Lying prone ~ 3-4 min Quad stretch 30 sec x 2 R/L Glut set 10 sec x 10  Supine  Bridging 10 sec x 10  Piriformis 30 sec x 2 R/L  Hamstring with strap 30 sec x 2 R/L  Hip flexor 30 sec x 2 R/L  [facility] Adult PT Treatment:                                                DATE: 06/23/23 Therapeutic Exercise: Treadmill 1.2 mph x 4 in fwd; then .5 mph x 2 min walking backward working on posture and alignment.  Standing Back to wall lifting hips away from wall 10 sec hold x 5   Hip extension 5 sec x 10 R/L  Hip abduction leading with heel x 10 R/L  Sitting  Hip flexor  stretch 30 sec x 3 R/L  Prone  Lying prone ~ 3-4 min Glut set 10 sec x 10  Supine (Home Program) Piriformis 30 sec x 3 R/L Hamstring with strap 30 sec x 2 R/L  Hip flexor 30 sec x 2 R/L    PATIENT EDUCATION:  Education details: POC; HEP  Person educated: Patient Education method: Explanation, Demonstration, Tactile cues, Verbal cues, and Handouts Education comprehension: verbalized understanding, returned demonstration, verbal cues required, tactile cues required, and needs further education  HOME EXERCISE PROGRAM:  Access Code: [email or web address] URL: [email or web address] Date: 06/25/2023 Prepared by: Corlis Leak  Exercises - Supine Piriformis Stretch with Leg Straight  - 2 x daily - 7 x weekly - 1 sets - 3 reps - 30 sec  hold - Hooklying Hamstring Stretch with Strap  - 2 x daily - 7 x weekly - 1 sets - 3 reps - 30 sec  hold - Hip Flexor Stretch at Edge of Bed  - 2 x daily - 7 x weekly - 1 sets - 3 reps - 30 sec  hold - Correct Standing Posture  - 2 x daily - 7 x weekly - 1 sets -  3 reps - 30 sec  hold - Seated Hip Flexor Stretch  - 2 x daily - 7 x weekly - 1 sets - 3 reps - 30 sec  hold - Standing Hip Extension with Counter Support  - 2 x daily - 7 x weekly - 1-2 sets - 10 reps - 3-5 sec  hold - Standing Hip Abduction with Unilateral Counter Support  - 2 x daily - 7 x weekly - 1 sets - 10 reps - 3-5 sec  hold - Standing Hip Flexor Stretch  - 2 x daily - 7 x weekly - 1 sets - 3-5 reps - 10 sec  hold - Standing Knee Flexion Stretch on Step  - 2 x daily - 7 x weekly - 1 sets - 5-10 reps - 1-20 sec  hold - Standing Hip Extension with Unilateral Counter Support  - 2 x daily - 7 x weekly - 1 sets - 3 reps - 30 sec  hold ASSESSMENT:  CLINICAL IMPRESSION: Patient has been working on exercises at home and at the gym and has been doing some backward walking at home. Reviewed and progressed exercises. Encouraged frequent standing stretch during the day. Anticipate gradual progress  with core strengthening and stretch for the hip flexors due to the longstanding nature of LBP and hip tightness.    EVAL: Patient is a 72 y.o. male who was seen today for physical therapy evaluation and treatment for lumbar spondylosis. Patient has a history of LBP over the past several years. He was treated by neurologist with injection and ablation with no change in symptoms. Patient presents with chronic R > L LBP; poor posture and alignment; limited trunk ROM/mobility; LE and core weakness; pain limiting functional activities and ADL's. Patient will benefit from PT to address problems identified.     OBJECTIVE IMPAIRMENTS: Abnormal gait, decreased activity tolerance, decreased balance, decreased mobility, difficulty walking, decreased ROM, decreased strength, hypomobility, increased fascial restrictions, impaired flexibility, improper body mechanics, postural dysfunction, and pain.    GOALS: Goals reviewed with patient? Yes  SHORT TERM GOALS: Target date: 07/29/2023   Independent in initial HEP Baseline: Goal status: INITIAL  2.  Improve hip extension with patient to demonstrate improve upright posture and alignment.  Baseline:  Goal status: INITIAL   LONG TERM GOALS: Target date: 09/09/2023   Increase LE strength to 4/5 to 5/5 througout Baseline:  Goal status: INITIAL  2.  Improve bilat hip extension to neutral  Baseline:  Goal status: INITIAL  3.  Decrease LBP by 50-70% allowing patient to increase functional activity level  Baseline:  Goal status: INITIAL  4.  Decrease 5 times sit to stand to 10 seconds  Baseline: 12.98 Goal status: INITIAL  5.  Independent in HEP, including aquatic program as indicated  Baseline:  Goal status: INITIAL  6.  Improve functional limitation score to 53 Baseline: 40 Goal status: INITIAL  PLAN:  PT FREQUENCY: 2x/week  PT DURATION: 12 weeks  PLANNED INTERVENTIONS: Therapeutic exercises, Therapeutic activity, Neuromuscular  re-education, Balance training, Gait training, Patient/Family education, Self Care, Joint mobilization, Stair training, Aquatic Therapy, Dry Needling, Electrical stimulation, Spinal mobilization, Cryotherapy, Moist heat, Taping, Traction, Ultrasound, Ionotophoresis 4mg /ml Dexamethasone, Manual therapy, and Re-evaluation.  PLAN FOR NEXT SESSION: review and progress HEP; continue back care and body mechanics education; manual work, DN, modalities as indicated    [employer], PT 06/25/2023, 1:07 PM

## 2023-06-30 ENCOUNTER — Telehealth: Payer: Self-pay

## 2023-06-30 ENCOUNTER — Ambulatory Visit: Payer: Medicare PPO | Admitting: Rehabilitative and Restorative Service Providers"

## 2023-06-30 ENCOUNTER — Encounter: Payer: Self-pay | Admitting: Rehabilitative and Restorative Service Providers"

## 2023-06-30 DIAGNOSIS — R898 Other abnormal findings in specimens from other organs, systems and tissues: Secondary | ICD-10-CM | POA: Diagnosis not present

## 2023-06-30 DIAGNOSIS — M47816 Spondylosis without myelopathy or radiculopathy, lumbar region: Secondary | ICD-10-CM | POA: Diagnosis not present

## 2023-06-30 DIAGNOSIS — M5459 Other low back pain: Secondary | ICD-10-CM | POA: Diagnosis not present

## 2023-06-30 DIAGNOSIS — M6281 Muscle weakness (generalized): Secondary | ICD-10-CM

## 2023-06-30 DIAGNOSIS — R29898 Other symptoms and signs involving the musculoskeletal system: Secondary | ICD-10-CM

## 2023-06-30 NOTE — Telephone Encounter (Signed)
Opened in error

## 2023-06-30 NOTE — Therapy (Signed)
OUTPATIENT PHYSICAL THERAPY THORACOLUMBAR EVALUATION   Patient Name: John Morrow MRN: [id number] DOB:07-Feb-1951, 72 y.o., male Today's Date: 06/30/2023  END OF SESSION:  PT End of Session - 06/30/23 0817     Visit Number 4    Number of Visits 24    Date for PT Re-Evaluation 09/10/23    Authorization Type [employer] copay $[id number]    PT Start Time 0800    PT Stop Time 0845    PT Time Calculation (min) 45 min    Activity Tolerance Patient tolerated treatment well             Past Medical History:  Diagnosis Date   Allergy    Arthritis    Back pain    Diverticulosis of colon without hemorrhage 06/12/2016   On Colonoscopy 11/06/2010   HLD (hyperlipidemia) 05/29/2016   Hyperlipemia    Hypertension    Joint pain    Morbid obesity (HCC) 05/29/2016   Prediabetes    Right hip pain    needs hip replacement   Sleep apnea    on CPAP   Past Surgical History:  Procedure Laterality Date   EYE SURGERY     age 5   JOINT REPLACEMENT     Hip   KNEE SURGERY     REPLACEMENT TOTAL HIP W/  RESURFACING IMPLANTS Right 03/2019   Dr. Marcell Barlow   Patient Active Problem List   Diagnosis Date Noted   Myalgia 01/07/2023   Cognitive changes 01/07/2023   COVID-19 09/02/2022   Abnormal CT scan of head 09/02/2022   Lumbar radiculopathy 07/16/2022   Acute pancreatitis 04/07/2022   OSA (obstructive sleep apnea) 02/19/2022   Gout 12/24/2021   Chronic rhinitis 08/21/2021   Impotence of organic origin 02/13/2021   Lumbar spondylosis 10/09/2020   Pre-operative exam 07/18/2020   Verruca 05/01/2020   Epiploic appendagitis 02/20/2020   Bilateral shoulder region arthritis 04/13/2019   Vitamin D deficiency 02/08/2019   Insulin resistance 02/08/2019   Class 2 severe obesity with serious comorbidity and body mass index (BMI) of 39.0 to 39.9 in adult (HCC) 02/08/2019   Prediabetes 02/08/2019   Chronic pain 09/28/2018   Hip pain 06/28/2018   Dependent edema 05/05/2017   Primary  osteoarthritis of left knee 09/25/2016   BPH (benign prostatic hyperplasia) 05/29/2016   HTN (hypertension) 05/29/2016   HLD (hyperlipidemia) 05/29/2016   Class 2 obesity due to excess calories without serious comorbidity with body mass index (BMI) of 39.0 to 39.9 in adult 05/29/2016   Sleep apnea 03/31/2014    PCP: Dr Everrett Coombe   REFERRING PROVIDER: Clent Jacks Crist Infante, PA-C  REFERRING DIAG: Spondylosis without myelopathy or radiculopathy   Rationale for Evaluation and Treatment: Rehabilitation  THERAPY DIAG:  Other low back pain  Other symptoms and signs involving the musculoskeletal system  Muscle weakness (generalized)  ONSET DATE: 09/15/22; history of LBP ~ 6 years   SUBJECTIVE:  SUBJECTIVE STATEMENT: Patient reports that his back is doing ok. He is working on exercises at home.     EVAL: Patient reports that he has continued to have back pain since he was last in Physical Therapy. He has seen neurologist and received several injections including an ablation with no change in pain.   PERTINENT HISTORY:  HTN; hyperlipemia; Rt THA; obesity; back pain; prediabetes; sleep apnea  PAIN:  Are you having pain? Yes: NPRS scale: 1-2/10 Pain location: low back R > L  Pain description: sharp and dull  Aggravating factors: getting up from sitting or lying down; lifting less than 10 # Relieving factors: meds;   PRECAUTIONS: None   WEIGHT BEARING RESTRICTIONS: No  FALLS:  Has patient fallen in last 6 months? 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  DIAGNOSTIC FINDINGS:  MRI 07/17/23: 1. Grade 1 retrolisthesis of L1 on L2 and L2 on L3 with associated disc degeneration and bulge at L2-L3 resulting in mild spinal canal stenosis with bilateral subarticular zone narrowing and possible irritation of either traversing L3 nerve root, and moderate bilateral neural foraminal stenosis. 2. Moderate left worse than right facet arthropathy at L4-L5 without significant spinal canal or neural foraminal stenosis. 3. Disc degeneration at L5-S1 with left foraminal and right subarticular zone protrusions resulting in mild left neural foraminal stenosis with possible contact of the exiting L5 nerve root, and right subarticular zone narrowing with possible contact of the traversing right S1 nerve root.   MUSCLE LENGTH: Hamstrings: Right 45 deg; Left 50 deg Thomas test: approximately - Right -30 deg; Left -25 deg  POSTURE: rounded shoulders, forward head, decreased lumbar lordosis, increased thoracic kyphosis, and extremely flexed trunk   PALPATION: Muscular tightness R > L hip flexors and R lumbar paraspinals   LUMBAR ROM:   AROM eval  Flexion 70% pulling   Extension 30%  Right lateral flexion 65% discomfort   Left lateral flexion 60%   Right rotation 20%  Left rotation 20%   (Blank rows = not tested)  LOWER EXTREMITY ROM:   tight R hip in all planes; bilat LE's limited ROM due to patient obesity    Approximate limitations in hip extension   Active  Right eval Left eval  Hip flexion    Hip extension -30 -25  Hip abduction    Hip adduction    Hip internal rotation    Hip external rotation    Knee flexion    Knee extension    Ankle dorsiflexion    Ankle plantarflexion    Ankle inversion    Ankle eversion     (Blank rows = not tested)  LOWER EXTREMITY MMT:    MMT Right eval Left eval  Hip  flexion 4 4+  Hip extension 4- 4-  Hip abduction 4 4  Hip adduction    Hip internal rotation    Hip external rotation    Knee flexion 5 5  Knee extension 5 5  Ankle dorsiflexion    Ankle plantarflexion    Ankle inversion    Ankle eversion     (Blank rows = not tested)  LUMBAR SPECIAL TESTS:  Straight leg raise test: Negative and Slump test: Negative SLS - unable to stand on one leg without UE support; 10 sec with UE support   FUNCTIONAL TESTS:  5 times sit to stand: 12.98  GAIT: Distance walked: 40 ft Assistive device utilized: None Level of assistance: Complete Independence Comments: forward flexed posture through trunk and hips bilat; poor trunk stability with lateral shift of upper body opposite to wt shift    [facility] Adult PT Treatment:                                                DATE: 06/30/23 Therapeutic Exercise: Treadmill .5 mph x 2 min walking backward working on posture and alignment.  Standing Back to wall lifting hips away from wall 10 sec hold x 5   Hip extension green TB 5 sec x 10 x R/L  Hip abduction leading with heel green TB x 10 R/L  Sitting  Hip flexor stretch 30 sec x 3 R/L  Prone  Lying prone ~ 3-4 min Quad set toe resting on table 3 sec x 10 R/L  Quad stretch 30 sec x 2 R/L Glut set 10 sec x 10  Hip extension 3 sec x 10 R/L  Supine  Bridging painful stopped Piriformis stretch 30 sec x 2 R/L  Hamstring with strap 30 sec x 2 R/L  Hip flexor 30 sec x 2 R/L   [facility] Adult PT Treatment:                                                DATE: 06/25/23 Therapeutic Exercise: Treadmill .5 mph x 2 min walking backward working on posture and alignment.  Standing Back to wall lifting hips away from wall 10 sec hold x 5   Hip extension 5 sec x 10 x R/L  Hip abduction leading with heel x 10 R/L  Partial lunge on 12 inch step 20 sec x 5 R/L Slider hip extension 3 sec x 10 R/L  Backward step up 4 in step x 10 x 2 R/L  Sitting  Hip flexor stretch 30 sec x  3 R/L  Prone  Lying prone ~ 3-4 min Quad stretch 30 sec x 2 R/L Glut set 10 sec x 10  Supine  Bridging 10 sec x 10  Piriformis 30 sec x 2 R/L  Hamstring with strap 30 sec x 2 R/L  Hip flexor 30 sec x 2 R/L   PATIENT EDUCATION:  Education details: POC; HEP  Person educated: Patient Education method: Programmer, multimedia, Demonstration, Tactile cues, Verbal cues, and Handouts Education comprehension: verbalized understanding, returned demonstration, verbal cues required, tactile cues required, and needs further education  HOME EXERCISE PROGRAM:  Access Code: [email or web address] URL: [email or web address] Date: 06/25/2023 Prepared by: Corlis Leak  Exercises - Supine Piriformis Stretch with Leg Straight  - 2 x daily - 7 x weekly - 1 sets - 3 reps - 30 sec  hold - Hooklying Hamstring Stretch with Strap  - 2 x daily - 7 x weekly - 1 sets - 3 reps - 30 sec  hold - Hip Flexor Stretch at Edge of Bed  - 2 x daily - 7 x weekly - 1 sets - 3 reps - 30  sec  hold - Correct Standing Posture  - 2 x daily - 7 x weekly - 1 sets - 3 reps - 30 sec  hold - Seated Hip Flexor Stretch  - 2 x daily - 7 x weekly - 1 sets - 3 reps - 30 sec  hold - Standing Hip Extension with Counter Support  - 2 x daily - 7 x weekly - 1-2 sets - 10 reps - 3-5 sec  hold - Standing Hip Abduction with Unilateral Counter Support  - 2 x daily - 7 x weekly - 1 sets - 10 reps - 3-5 sec  hold - Standing Hip Flexor Stretch  - 2 x daily - 7 x weekly - 1 sets - 3-5 reps - 10 sec  hold - Standing Knee Flexion Stretch on Step  - 2 x daily - 7 x weekly - 1 sets - 5-10 reps - 1-20 sec  hold - Standing Hip Extension with Unilateral Counter Support  - 2 x daily - 7 x weekly - 1 sets - 3 reps - 30 sec  hold ASSESSMENT:  CLINICAL IMPRESSION: Patient reports continued gradual improvement. He has been working on exercises at home and at the gym and has been doing some backward walking at home. Reviewed and progressed exercises. Encouraged frequent  standing stretch during the day. Anticipate gradual progress with core strengthening and stretch for the hip flexors due to the longstanding nature of LBP and hip tightness.    EVAL: Patient is a 72 y.o. male who was seen today for physical therapy evaluation and treatment for lumbar spondylosis. Patient has a history of LBP over the past several years. He was treated by neurologist with injection and ablation with no change in symptoms. Patient presents with chronic R > L LBP; poor posture and alignment; limited trunk ROM/mobility; LE and core weakness; pain limiting functional activities and ADL's. Patient will benefit from PT to address problems identified.     OBJECTIVE IMPAIRMENTS: Abnormal gait, decreased activity tolerance, decreased balance, decreased mobility, difficulty walking, decreased ROM, decreased strength, hypomobility, increased fascial restrictions, impaired flexibility, improper body mechanics, postural dysfunction, and pain.    GOALS: Goals reviewed with patient? Yes  SHORT TERM GOALS: Target date: 07/29/2023   Independent in initial HEP Baseline: Goal status: INITIAL  2.  Improve hip extension with patient to demonstrate improve upright posture and alignment.  Baseline:  Goal status: INITIAL   LONG TERM GOALS: Target date: 09/09/2023   Increase LE strength to 4/5 to 5/5 througout Baseline:  Goal status: INITIAL  2.  Improve bilat hip extension to neutral  Baseline:  Goal status: INITIAL  3.  Decrease LBP by 50-70% allowing patient to increase functional activity level  Baseline:  Goal status: INITIAL  4.  Decrease 5 times sit to stand to 10 seconds  Baseline: 12.98 Goal status: INITIAL  5.  Independent in HEP, including aquatic program as indicated  Baseline:  Goal status: INITIAL  6.  Improve functional limitation score to 53 Baseline: 40 Goal status: INITIAL  PLAN:  PT FREQUENCY: 2x/week  PT DURATION: 12 weeks  PLANNED INTERVENTIONS:  Therapeutic exercises, Therapeutic activity, Neuromuscular re-education, Balance training, Gait training, Patient/Family education, Self Care, Joint mobilization, Stair training, Aquatic Therapy, Dry Needling, Electrical stimulation, Spinal mobilization, Cryotherapy, Moist heat, Taping, Traction, Ultrasound, Ionotophoresis 4mg /ml Dexamethasone, Manual therapy, and Re-evaluation.  PLAN FOR NEXT SESSION: review and progress HEP; continue back care and body mechanics education; manual work, DN, modalities as indicated  Val Riles, PT 06/30/2023, 8:18 AM

## 2023-07-02 ENCOUNTER — Ambulatory Visit: Payer: Medicare PPO | Admitting: Rehabilitative and Restorative Service Providers"

## 2023-07-02 ENCOUNTER — Encounter: Payer: Self-pay | Admitting: Rehabilitative and Restorative Service Providers"

## 2023-07-02 DIAGNOSIS — R29898 Other symptoms and signs involving the musculoskeletal system: Secondary | ICD-10-CM

## 2023-07-02 DIAGNOSIS — M6281 Muscle weakness (generalized): Secondary | ICD-10-CM | POA: Diagnosis not present

## 2023-07-02 DIAGNOSIS — M5459 Other low back pain: Secondary | ICD-10-CM | POA: Diagnosis not present

## 2023-07-02 DIAGNOSIS — R898 Other abnormal findings in specimens from other organs, systems and tissues: Secondary | ICD-10-CM | POA: Diagnosis not present

## 2023-07-02 DIAGNOSIS — M47816 Spondylosis without myelopathy or radiculopathy, lumbar region: Secondary | ICD-10-CM | POA: Diagnosis not present

## 2023-07-02 NOTE — Therapy (Signed)
OUTPATIENT PHYSICAL THERAPY THORACOLUMBAR EVALUATION   Patient Name: John Morrow MRN: [id number] DOB:October 29, 1950, 72 y.o., male Today's Date: 07/02/2023  END OF SESSION:  PT End of Session - 07/02/23 0808     Visit Number 5    Number of Visits 24    Date for PT Re-Evaluation 09/10/23    Authorization Type [employer] state [employer] copay $20    Progress Note Due on Visit 10    PT Start Time 0802    PT Stop Time 0845    PT Time Calculation (min) 43 min    Activity Tolerance Patient tolerated treatment well             Past Medical History:  Diagnosis Date   Allergy    Arthritis    Back pain    Diverticulosis of colon without hemorrhage 06/12/2016   On Colonoscopy 11/06/2010   HLD (hyperlipidemia) 05/29/2016   Hyperlipemia    Hypertension    Joint pain    Morbid obesity (HCC) 05/29/2016   Prediabetes    Right hip pain    needs hip replacement   Sleep apnea    on CPAP   Past Surgical History:  Procedure Laterality Date   EYE SURGERY     age 66   JOINT REPLACEMENT     Hip   KNEE SURGERY     REPLACEMENT TOTAL HIP W/  RESURFACING IMPLANTS Right 03/2019   Dr. Marcell Barlow   Patient Active Problem List   Diagnosis Date Noted   Myalgia 01/07/2023   Cognitive changes 01/07/2023   COVID-19 09/02/2022   Abnormal CT scan of head 09/02/2022   Lumbar radiculopathy 07/16/2022   Acute pancreatitis 04/07/2022   OSA (obstructive sleep apnea) 02/19/2022   Gout 12/24/2021   Chronic rhinitis 08/21/2021   Impotence of organic origin 02/13/2021   Lumbar spondylosis 10/09/2020   Pre-operative exam 07/18/2020   Verruca 05/01/2020   Epiploic appendagitis 02/20/2020   Bilateral shoulder region arthritis 04/13/2019   Vitamin D deficiency 02/08/2019   Insulin resistance 02/08/2019   Class 2 severe obesity with serious comorbidity and body mass index (BMI) of 39.0 to 39.9 in adult (HCC) 02/08/2019   Prediabetes 02/08/2019   Chronic pain 09/28/2018   Hip pain 06/28/2018   Dependent  edema 05/05/2017   Primary osteoarthritis of left knee 09/25/2016   BPH (benign prostatic hyperplasia) 05/29/2016   HTN (hypertension) 05/29/2016   HLD (hyperlipidemia) 05/29/2016   Class 2 obesity due to excess calories without serious comorbidity with body mass index (BMI) of 39.0 to 39.9 in adult 05/29/2016   Sleep apnea 03/31/2014    PCP: Dr Everrett Coombe   REFERRING PROVIDER: Clent Jacks Crist Infante, PA-C  REFERRING DIAG: Spondylosis without myelopathy or radiculopathy   Rationale for Evaluation and Treatment: Rehabilitation  THERAPY DIAG:  Other low back pain  Other symptoms and signs involving the musculoskeletal system  Muscle weakness (generalized)  ONSET DATE: 09/15/22; history of LBP ~ 6 years   SUBJECTIVE:  SUBJECTIVE STATEMENT: Patient reports that his back is stiff but less painful. He is working on exercises at home.     EVAL: Patient reports that he has continued to have back pain since he was last in Physical Therapy. He has seen neurologist and received several injections including an ablation with no change in pain.   PERTINENT HISTORY:  HTN; hyperlipemia; Rt THA; obesity; back pain; prediabetes; sleep apnea  PAIN:  Are you having pain? Yes: NPRS scale: 2-3/10 Pain location: low back R > L  Pain description: sharp and dull  Aggravating factors: getting up from sitting or lying down; lifting less than 10 # Relieving factors: meds;   PRECAUTIONS: None   WEIGHT BEARING RESTRICTIONS: No  FALLS:  Has patient fallen in last 6 months? 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  DIAGNOSTIC FINDINGS:  MRI 07/17/23: 1. Grade 1 retrolisthesis of L1 on L2 and L2 on L3 with associated disc degeneration and bulge at L2-L3 resulting in mild spinal canal stenosis with bilateral subarticular zone narrowing and possible irritation of either traversing L3 nerve root, and moderate bilateral neural foraminal stenosis. 2. Moderate left worse than right facet arthropathy at L4-L5 without significant spinal canal or neural foraminal stenosis. 3. Disc degeneration at L5-S1 with left foraminal and right subarticular zone protrusions resulting in mild left neural foraminal stenosis with possible contact of the exiting L5 nerve root, and right subarticular zone narrowing with possible contact of the traversing right S1 nerve root.   MUSCLE LENGTH: Hamstrings: Right 45 deg; Left 50 deg Thomas test: approximately - Right -30 deg; Left -25 deg  POSTURE: rounded shoulders, forward head, decreased lumbar lordosis, increased thoracic kyphosis, and extremely flexed trunk   PALPATION: Muscular tightness R > L hip flexors and R lumbar paraspinals   LUMBAR ROM:   AROM eval  Flexion 70% pulling   Extension 30%  Right lateral flexion 65% discomfort   Left lateral flexion 60%   Right rotation 20%  Left rotation 20%   (Blank rows = not tested)  LOWER EXTREMITY ROM:   tight R hip in all planes; bilat LE's limited ROM due to patient obesity    Approximate limitations in hip extension   Active  Right eval Left eval  Hip flexion    Hip extension -30 -25  Hip abduction    Hip adduction    Hip internal rotation    Hip external rotation    Knee flexion    Knee extension    Ankle dorsiflexion    Ankle plantarflexion    Ankle inversion    Ankle eversion     (Blank rows = not tested)  LOWER EXTREMITY MMT:     MMT Right eval Left eval  Hip flexion 4 4+  Hip extension 4- 4-  Hip abduction 4 4  Hip adduction    Hip internal rotation    Hip external rotation    Knee flexion 5 5  Knee extension 5 5  Ankle dorsiflexion    Ankle plantarflexion    Ankle inversion    Ankle eversion     (Blank rows = not tested)  LUMBAR  SPECIAL TESTS:  Straight leg raise test: Negative and Slump test: Negative SLS - unable to stand on one leg without UE support; 10 sec with UE support   FUNCTIONAL TESTS:  5 times sit to stand: 12.98  GAIT: Distance walked: 40 ft Assistive device utilized: None Level of assistance: Complete Independence Comments: forward flexed posture through trunk and hips bilat; poor trunk stability with lateral shift of upper body opposite to wt shift    [facility] Adult PT Treatment:                                                DATE: 07/02/23 Therapeutic Exercise: Treadmill .7 mph x 5 min walking backward working on posture and alignment.  Standing Back to wall lifting hips away from wall 10 sec hold x 10   Isometric row blue TB 5 sec x 10  Antirotation blue TB 3 sec x 10  Hip extension forearms on counter 3 sec x 10 x R/L  Hip flexor stretch one foot on 12 inch step partial lunge 30 sec x 3 R/L  Sitting  Hip flexor stretch 30 sec x 3 R/L  Prone  Hip flexor stretch at table one leg on one foot on floor 30 sec x 2 Reviewed for HEP  Lying prone ~ 3-4 min Quad set toe resting on table 3 sec x 10 R/L  Quad stretch 30 sec x 2 R/L Glut set 10 sec x 10  Hip extension 3 sec x 10 R/L  Supine  Piriformis stretch 30 sec x 2 R/L  Hamstring with strap 30 sec x 2 R/L  Hip flexor 30 sec x 2 R/L   [facility] Adult PT Treatment:                                                DATE: 06/30/23 Therapeutic Exercise: Treadmill .5 mph x 2 min walking backward working on posture and alignment.  Standing Back to wall lifting hips away from wall 10 sec hold x 5   Hip extension green TB 5 sec x 10 x  R/L  Hip abduction leading with heel green TB x 10 R/L  Sitting  Hip flexor stretch 30 sec x 3 R/L  Prone  Lying prone ~ 3-4 min Quad set toe resting on table 3 sec x 10 R/L  Quad stretch 30 sec x 2 R/L Glut set 10 sec x 10  Hip extension 3 sec x 10 R/L  Supine  Bridging painful stopped Piriformis stretch 30 sec x 2 R/L  Hamstring with strap 30 sec x 2 R/L  Hip flexor 30 sec x 2 R/L   PATIENT EDUCATION:  Education details: POC; HEP  Person educated: Patient Education method: Programmer, multimedia, Demonstration, Tactile cues, Verbal cues, and Handouts Education comprehension: verbalized understanding, returned demonstration, verbal cues required, tactile cues required, and needs further education  HOME EXERCISE PROGRAM: Access Code: [email or web address] URL: [email or web address] Date: 07/02/2023 Prepared by: Corlis Leak  Exercises - Supine Piriformis Stretch with Leg Straight  - 2 x daily - 7 x weekly - 1 sets - 3 reps - 30 sec  hold - Hooklying Hamstring Stretch with Strap  - 2 x daily - 7 x weekly - 1 sets -  3 reps - 30 sec  hold - Hip Flexor Stretch at Edge of Bed  - 2 x daily - 7 x weekly - 1 sets - 3 reps - 30 sec  hold - Correct Standing Posture  - 2 x daily - 7 x weekly - 1 sets - 3 reps - 30 sec  hold - Seated Hip Flexor Stretch  - 2 x daily - 7 x weekly - 1 sets - 3 reps - 30 sec  hold - Standing Hip Extension with Counter Support  - 2 x daily - 7 x weekly - 1-2 sets - 10 reps - 3-5 sec  hold - Standing Hip Abduction with Unilateral Counter Support  - 2 x daily - 7 x weekly - 1 sets - 10 reps - 3-5 sec  hold - Standing Hip Flexor Stretch  - 2 x daily - 7 x weekly - 1 sets - 3-5 reps - 10 sec  hold - Standing Knee Flexion Stretch on Step  - 2 x daily - 7 x weekly - 1 sets - 5-10 reps - 1-20 sec  hold - Standing Hip Extension with Unilateral Counter Support  - 2 x daily - 7 x weekly - 1 sets - 3 reps - 30 sec  hold - Standing Shoulder Row Reactive Isometric  - 2 x daily - 7 x  weekly - 1 sets - 10 reps - 30-45 sec  hold - Anti-Rotation Lateral Stepping with Press  - 2 x daily - 7 x weekly - 1-2 sets - 10 reps - 2-3 sec  hold  ASSESSMENT:  CLINICAL IMPRESSION: Patient reports less pain but persistent stiffness in the LB. He has been working on exercises at home and at the gym and has been doing some backward walking at home. Reviewed and progressed exercises. Encouraged frequent standing stretch during the day. Anticipate gradual progress with core strengthening and stretch for the hip flexors due to the longstanding nature of LBP and hip tightness.    EVAL: Patient is a 72 y.o. male who was seen today for physical therapy evaluation and treatment for lumbar spondylosis. Patient has a history of LBP over the past several years. He was treated by neurologist with injection and ablation with no change in symptoms. Patient presents with chronic R > L LBP; poor posture and alignment; limited trunk ROM/mobility; LE and core weakness; pain limiting functional activities and ADL's. Patient will benefit from PT to address problems identified.     OBJECTIVE IMPAIRMENTS: Abnormal gait, decreased activity tolerance, decreased balance, decreased mobility, difficulty walking, decreased ROM, decreased strength, hypomobility, increased fascial restrictions, impaired flexibility, improper body mechanics, postural dysfunction, and pain.    GOALS: Goals reviewed with patient? Yes  SHORT TERM GOALS: Target date: 07/29/2023   Independent in initial HEP Baseline: Goal status: INITIAL  2.  Improve hip extension with patient to demonstrate improve upright posture and alignment.  Baseline:  Goal status: INITIAL   LONG TERM GOALS: Target date: 09/09/2023   Increase LE strength to 4/5 to 5/5 througout Baseline:  Goal status: INITIAL  2.  Improve bilat hip extension to neutral  Baseline:  Goal status: INITIAL  3.  Decrease LBP by 50-70% allowing patient to increase functional  activity level  Baseline:  Goal status: INITIAL  4.  Decrease 5 times sit to stand to 10 seconds  Baseline: 12.98 Goal status: INITIAL  5.  Independent in HEP, including aquatic program as indicated  Baseline:  Goal status: INITIAL  6.  Improve functional limitation score to 53 Baseline: 40 Goal status: INITIAL  PLAN:  PT FREQUENCY: 2x/week  PT DURATION: 12 weeks  PLANNED INTERVENTIONS: Therapeutic exercises, Therapeutic activity, Neuromuscular re-education, Balance training, Gait training, Patient/Family education, Self Care, Joint mobilization, Stair training, Aquatic Therapy, Dry Needling, Electrical stimulation, Spinal mobilization, Cryotherapy, Moist heat, Taping, Traction, Ultrasound, Ionotophoresis 4mg /ml Dexamethasone, Manual therapy, and Re-evaluation.  PLAN FOR NEXT SESSION: review and progress HEP; continue back care and body mechanics education; manual work, DN, modalities as indicated    [employer], PT 07/02/2023, 8:10 AM

## 2023-07-03 ENCOUNTER — Other Ambulatory Visit: Payer: Self-pay | Admitting: Family Medicine

## 2023-07-06 ENCOUNTER — Encounter: Payer: Self-pay | Admitting: Rehabilitative and Restorative Service Providers"

## 2023-07-06 ENCOUNTER — Ambulatory Visit: Payer: Medicare PPO | Admitting: Rehabilitative and Restorative Service Providers"

## 2023-07-06 DIAGNOSIS — R898 Other abnormal findings in specimens from other organs, systems and tissues: Secondary | ICD-10-CM | POA: Diagnosis not present

## 2023-07-06 DIAGNOSIS — M5459 Other low back pain: Secondary | ICD-10-CM

## 2023-07-06 DIAGNOSIS — M47816 Spondylosis without myelopathy or radiculopathy, lumbar region: Secondary | ICD-10-CM | POA: Diagnosis not present

## 2023-07-06 DIAGNOSIS — R29898 Other symptoms and signs involving the musculoskeletal system: Secondary | ICD-10-CM

## 2023-07-06 DIAGNOSIS — M6281 Muscle weakness (generalized): Secondary | ICD-10-CM | POA: Diagnosis not present

## 2023-07-06 DIAGNOSIS — G4733 Obstructive sleep apnea (adult) (pediatric): Secondary | ICD-10-CM | POA: Diagnosis not present

## 2023-07-06 NOTE — Therapy (Signed)
OUTPATIENT PHYSICAL THERAPY THORACOLUMBAR EVALUATION   Patient Name: John Morrow MRN: [id number] DOB:1951/08/29, 72 y.o., male Today's Date: 07/06/2023  END OF SESSION:    Past Medical History:  Diagnosis Date   Allergy    Arthritis    Back pain    Diverticulosis of colon without hemorrhage 06/12/2016   On Colonoscopy 11/06/2010   HLD (hyperlipidemia) 05/29/2016   Hyperlipemia    Hypertension    Joint pain    Morbid obesity (HCC) 05/29/2016   Prediabetes    Right hip pain    needs hip replacement   Sleep apnea    on CPAP   Past Surgical History:  Procedure Laterality Date   EYE SURGERY     age 38   JOINT REPLACEMENT     Hip   KNEE SURGERY     REPLACEMENT TOTAL HIP W/  RESURFACING IMPLANTS Right 03/2019   Dr. Marcell Barlow   Patient Active Problem List   Diagnosis Date Noted   Myalgia 01/07/2023   Cognitive changes 01/07/2023   COVID-19 09/02/2022   Abnormal CT scan of head 09/02/2022   Lumbar radiculopathy 07/16/2022   Acute pancreatitis 04/07/2022   OSA (obstructive sleep apnea) 02/19/2022   Gout 12/24/2021   Chronic rhinitis 08/21/2021   Impotence of organic origin 02/13/2021   Lumbar spondylosis 10/09/2020   Pre-operative exam 07/18/2020   Verruca 05/01/2020   Epiploic appendagitis 02/20/2020   Bilateral shoulder region arthritis 04/13/2019   Vitamin D deficiency 02/08/2019   Insulin resistance 02/08/2019   Class 2 severe obesity with serious comorbidity and body mass index (BMI) of 39.0 to 39.9 in adult (HCC) 02/08/2019   Prediabetes 02/08/2019   Chronic pain 09/28/2018   Hip pain 06/28/2018   Dependent edema 05/05/2017   Primary osteoarthritis of left knee 09/25/2016   BPH (benign prostatic hyperplasia) 05/29/2016   HTN (hypertension) 05/29/2016   HLD (hyperlipidemia) 05/29/2016   Class 2 obesity due to excess calories without serious comorbidity with body mass index (BMI) of 39.0 to 39.9 in adult 05/29/2016   Sleep apnea 03/31/2014    PCP: Dr  Everrett Coombe   REFERRING PROVIDER: Clent Jacks Crist Infante, PA-C  REFERRING DIAG: Spondylosis without myelopathy or radiculopathy   Rationale for Evaluation and Treatment: Rehabilitation  THERAPY DIAG:  No diagnosis found.  ONSET DATE: 09/15/22; history of LBP ~ 6 years   SUBJECTIVE:                                                                                                                                                                                           SUBJECTIVE STATEMENT: Patient reports that his back is "  okay". It is stiff but less painful. He is working on exercises at home.     EVAL: Patient reports that he has continued to have back pain since he was last in Physical Therapy. He has seen neurologist and received several injections including an ablation with no change in pain.   PERTINENT HISTORY:  HTN; hyperlipemia; Rt THA; obesity; back pain; prediabetes; sleep apnea  PAIN:  Are you having pain? Yes: NPRS scale: 4-5/10 Pain location: low back R > L  Pain description: sharp and dull  Aggravating factors: getting up from sitting or lying down; lifting less than 10 # Relieving factors: meds;   PRECAUTIONS: None   WEIGHT BEARING RESTRICTIONS: No  FALLS:  Has patient fallen in last 6 months? 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  DIAGNOSTIC FINDINGS:  MRI 07/17/23: 1. Grade 1 retrolisthesis of L1 on L2 and L2 on L3 with associated disc degeneration and bulge at L2-L3 resulting in mild spinal canal stenosis with bilateral subarticular zone narrowing and possible irritation of either  traversing L3 nerve root, and moderate bilateral neural foraminal stenosis. 2. Moderate left worse than right facet arthropathy at L4-L5 without significant spinal canal or neural foraminal stenosis. 3. Disc degeneration at L5-S1 with left foraminal and right subarticular zone protrusions resulting in mild left neural foraminal stenosis with possible contact of the exiting L5 nerve root, and right subarticular zone narrowing with possible contact of the traversing right S1 nerve root.   MUSCLE LENGTH: Hamstrings: Right 45 deg; Left 50 deg Thomas test: approximately - Right -30 deg; Left -25 deg  POSTURE: rounded shoulders, forward head, decreased lumbar lordosis, increased thoracic kyphosis, and extremely flexed trunk   PALPATION: Muscular tightness R > L hip flexors and R lumbar paraspinals   LUMBAR ROM:   AROM eval  Flexion 70% pulling   Extension 30%  Right lateral flexion 65% discomfort   Left lateral flexion 60%   Right rotation 20%  Left rotation 20%   (Blank rows = not tested)  LOWER EXTREMITY ROM:   tight R hip in all planes; bilat LE's limited ROM due to patient obesity    Approximate limitations in hip extension   Active  Right eval Left eval  Hip flexion    Hip extension -30 -25  Hip abduction    Hip adduction    Hip internal rotation    Hip external rotation    Knee flexion    Knee extension    Ankle dorsiflexion    Ankle plantarflexion    Ankle inversion    Ankle eversion     (Blank rows = not tested)  LOWER EXTREMITY MMT:    MMT Right eval Left eval  Hip flexion 4 4+  Hip extension 4- 4-  Hip abduction 4 4  Hip adduction    Hip internal rotation    Hip external rotation    Knee flexion 5 5  Knee extension 5 5  Ankle dorsiflexion    Ankle plantarflexion    Ankle inversion    Ankle eversion     (Blank rows = not tested)  LUMBAR SPECIAL TESTS:  Straight leg raise  test: Negative and Slump test: Negative SLS - unable to stand on one leg  without UE support; 10 sec with UE support   FUNCTIONAL TESTS:  5 times sit to stand: 12.98  GAIT: Distance walked: 40 ft Assistive device utilized: None Level of assistance: Complete Independence Comments: forward flexed posture through trunk and hips bilat; poor trunk stability with lateral shift of upper body opposite to wt shift   [facility] Adult PT Treatment:                                                DATE: 07/06/23 Therapeutic Exercise: Treadmill .7 mph x 5 min walking backward working on posture and alignment.  Standing Back to wall lifting hips away from wall 10 sec hold x 10   Row blue TB 5 sec x 10  Antirotation blue TB 3 sec x 10 R/L Sports cord stepping back 10 sec x 5  Hip extension forearms on counter 3 sec x 10 x R/L  Hip flexor stretch one foot on 12 inch step partial lunge 30 sec x 3 R/L  Sitting  Hip flexor stretch 30 sec x 3 R/L  Prone  Quad stretch w/ strap 30 sec x 3 R/L  Glut set 10 sec x 10  Lying prone ~ 3-4 min Prone prop prone on elbows  Quad set toe resting on table 3 sec x 10 R/L  Hip extension 3 sec x 10 R/L  Supine  Piriformis stretch 30 sec x 2 R/L  Hamstring with strap 30 sec x 2 R/L  Hip flexor 30 sec x 2 R/L   [facility] Adult PT Treatment:                                                DATE: 07/02/23 Therapeutic Exercise: Treadmill .7 mph x 5 min walking backward working on posture and alignment.  Standing Back to wall lifting hips away from wall 10 sec hold x 10   Isometric row blue TB 5 sec x 10  Antirotation blue TB 3 sec x 10  Hip extension forearms on counter 3 sec x 10 x R/L  Hip flexor stretch one foot on 12 inch step partial lunge 30 sec x 3 R/L  Sitting  Hip flexor stretch 30 sec x 3 R/L  Prone  Hip flexor stretch at table one leg on one foot on floor 30 sec x 2 Reviewed for HEP  Lying prone ~ 3-4 min Quad set toe resting on table 3 sec x 10 R/L  Quad stretch 30 sec x 2 R/L Glut set 10 sec x 10  Hip extension 3 sec x 10 R/L   Supine  Piriformis stretch 30 sec x 2 R/L  Hamstring with strap 30 sec x 2 R/L  Hip flexor 30 sec x 2 R/L   PATIENT EDUCATION:  Education details: POC; HEP  Person educated: Patient Education method: Programmer, multimedia, Demonstration, Tactile cues, Verbal cues, and Handouts Education comprehension: verbalized understanding, returned demonstration, verbal cues required, tactile cues required, and needs further education  HOME EXERCISE PROGRAM: Access Code: [email or web address] URL: [email or web address] Date: 07/06/2023 Prepared by: Corlis Leak  Exercises - Supine Piriformis Stretch with Leg Straight  - 2 x daily -  7 x weekly - 1 sets - 3 reps - 30 sec  hold - Hooklying Hamstring Stretch with Strap  - 2 x daily - 7 x weekly - 1 sets - 3 reps - 30 sec  hold - Hip Flexor Stretch at Edge of Bed  - 2 x daily - 7 x weekly - 1 sets - 3 reps - 30 sec  hold - Correct Standing Posture  - 2 x daily - 7 x weekly - 1 sets - 3 reps - 30 sec  hold - Seated Hip Flexor Stretch  - 2 x daily - 7 x weekly - 1 sets - 3 reps - 30 sec  hold - Standing Hip Extension with Counter Support  - 2 x daily - 7 x weekly - 1-2 sets - 10 reps - 3-5 sec  hold - Standing Hip Abduction with Unilateral Counter Support  - 2 x daily - 7 x weekly - 1 sets - 10 reps - 3-5 sec  hold - Standing Hip Flexor Stretch  - 2 x daily - 7 x weekly - 1 sets - 3-5 reps - 10 sec  hold - Standing Knee Flexion Stretch on Step  - 2 x daily - 7 x weekly - 1 sets - 5-10 reps - 1-20 sec  hold - Standing Hip Extension with Unilateral Counter Support  - 2 x daily - 7 x weekly - 1 sets - 3 reps - 30 sec  hold - Standing Shoulder Row Reactive Isometric  - 2 x daily - 7 x weekly - 1 sets - 10 reps - 30-45 sec  hold - Anti-Rotation Lateral Stepping with Press  - 2 x daily - 7 x weekly - 1-2 sets - 10 reps - 2-3 sec  hold - Supine Diaphragmatic Breathing  - 2 x daily - 7 x weekly - 1 sets - 10 reps - 4-6 sec  hold  ASSESSMENT:  CLINICAL  IMPRESSION: Patient continues to have variable pain but reports less pain and more persistent stiffness in the LB. He has been working on exercises at home and at the gym and has been doing some backward walking at home. Reviewed and progressed exercises. Encouraged frequent standing stretch during the day. Anticipate gradual progress with core strengthening and stretch for the hip flexors due to the longstanding nature of LBP and hip tightness.    EVAL: Patient is a 72 y.o. male who was seen today for physical therapy evaluation and treatment for lumbar spondylosis. Patient has a history of LBP over the past several years. He was treated by neurologist with injection and ablation with no change in symptoms. Patient presents with chronic R > L LBP; poor posture and alignment; limited trunk ROM/mobility; LE and core weakness; pain limiting functional activities and ADL's. Patient will benefit from PT to address problems identified.     OBJECTIVE IMPAIRMENTS: Abnormal gait, decreased activity tolerance, decreased balance, decreased mobility, difficulty walking, decreased ROM, decreased strength, hypomobility, increased fascial restrictions, impaired flexibility, improper body mechanics, postural dysfunction, and pain.    GOALS: Goals reviewed with patient? Yes  SHORT TERM GOALS: Target date: 07/29/2023   Independent in initial HEP Baseline: Goal status: met   2.  Improve hip extension with patient to demonstrate improve upright posture and alignment.  Baseline:  Goal status: on going    LONG TERM GOALS: Target date: 09/09/2023   Increase LE strength to 4/5 to 5/5 througout Baseline:  Goal status: INITIAL  2.  Improve bilat hip  extension to neutral  Baseline:  Goal status: INITIAL  3.  Decrease LBP by 50-70% allowing patient to increase functional activity level  Baseline:  Goal status: INITIAL  4.  Decrease 5 times sit to stand to 10 seconds  Baseline: 12.98 Goal status:  INITIAL  5.  Independent in HEP, including aquatic program as indicated  Baseline:  Goal status: INITIAL  6.  Improve functional limitation score to 53 Baseline: 40 Goal status: INITIAL  PLAN:  PT FREQUENCY: 2x/week  PT DURATION: 12 weeks  PLANNED INTERVENTIONS: Therapeutic exercises, Therapeutic activity, Neuromuscular re-education, Balance training, Gait training, Patient/Family education, Self Care, Joint mobilization, Stair training, Aquatic Therapy, Dry Needling, Electrical stimulation, Spinal mobilization, Cryotherapy, Moist heat, Taping, Traction, Ultrasound, Ionotophoresis 4mg /ml Dexamethasone, Manual therapy, and Re-evaluation.  PLAN FOR NEXT SESSION: review and progress HEP; continue back care and body mechanics education; manual work, DN, modalities as indicated    [employer], PT 07/06/2023, 7:20 AM

## 2023-07-07 DIAGNOSIS — G4733 Obstructive sleep apnea (adult) (pediatric): Secondary | ICD-10-CM | POA: Diagnosis not present

## 2023-07-08 ENCOUNTER — Ambulatory Visit: Payer: Medicare PPO

## 2023-07-08 DIAGNOSIS — M6281 Muscle weakness (generalized): Secondary | ICD-10-CM | POA: Diagnosis not present

## 2023-07-08 DIAGNOSIS — R898 Other abnormal findings in specimens from other organs, systems and tissues: Secondary | ICD-10-CM | POA: Diagnosis not present

## 2023-07-08 DIAGNOSIS — M5459 Other low back pain: Secondary | ICD-10-CM | POA: Diagnosis not present

## 2023-07-08 DIAGNOSIS — M47816 Spondylosis without myelopathy or radiculopathy, lumbar region: Secondary | ICD-10-CM | POA: Diagnosis not present

## 2023-07-08 DIAGNOSIS — R29898 Other symptoms and signs involving the musculoskeletal system: Secondary | ICD-10-CM

## 2023-07-08 NOTE — Therapy (Signed)
OUTPATIENT PHYSICAL THERAPY THORACOLUMBAR TREATMENT   Patient Name: John Morrow MRN: [id number] DOB:07-20-1951, 72 y.o., male Today's Date: 07/08/2023  END OF SESSION:  PT End of Session - 07/08/23 0844     Visit Number 7    Number of Visits 24    Date for PT Re-Evaluation 09/10/23    Authorization Type [employer] copay $[id number]    Authorization Time Period 12 VISITS APPROVED FOR PT 06/17/2023-09/10/2023    Authorization - Visit Number 7    Authorization - Number of Visits 12    Progress Note Due on Visit 10    PT Start Time 0848    PT Stop Time 0943    PT Time Calculation (min) 55 min    Activity Tolerance Patient tolerated treatment well    Behavior During Therapy [facility] for tasks assessed/performed              Past Medical History:  Diagnosis Date   Allergy    Arthritis    Back pain    Diverticulosis of colon without hemorrhage 06/12/2016   On Colonoscopy 11/06/2010   HLD (hyperlipidemia) 05/29/2016   Hyperlipemia    Hypertension    Joint pain    Morbid obesity (HCC) 05/29/2016   Prediabetes    Right hip pain    needs hip replacement   Sleep apnea    on CPAP   Past Surgical History:  Procedure Laterality Date   EYE SURGERY     age 27   JOINT REPLACEMENT     Hip   KNEE SURGERY     REPLACEMENT TOTAL HIP W/  RESURFACING IMPLANTS Right 03/2019   Dr. Marcell Barlow   Patient Active Problem List   Diagnosis Date Noted   Myalgia 01/07/2023   Cognitive changes 01/07/2023   COVID-19 09/02/2022   Abnormal CT scan of head 09/02/2022   Lumbar radiculopathy 07/16/2022   Acute pancreatitis 04/07/2022   OSA (obstructive sleep apnea) 02/19/2022   Gout 12/24/2021   Chronic rhinitis 08/21/2021   Impotence of organic origin 02/13/2021   Lumbar spondylosis 10/09/2020   Pre-operative exam 07/18/2020   Verruca 05/01/2020   Epiploic appendagitis 02/20/2020   Bilateral shoulder region arthritis 04/13/2019   Vitamin D deficiency 02/08/2019   Insulin resistance  02/08/2019   Class 2 severe obesity with serious comorbidity and body mass index (BMI) of 39.0 to 39.9 in adult (HCC) 02/08/2019   Prediabetes 02/08/2019   Chronic pain 09/28/2018   Hip pain 06/28/2018   Dependent edema 05/05/2017   Primary osteoarthritis of left knee 09/25/2016   BPH (benign prostatic hyperplasia) 05/29/2016   HTN (hypertension) 05/29/2016   HLD (hyperlipidemia) 05/29/2016   Class 2 obesity due to excess calories without serious comorbidity with body mass index (BMI) of 39.0 to 39.9 in adult 05/29/2016   Sleep apnea 03/31/2014    PCP: Dr Everrett Coombe   REFERRING PROVIDER: Clent Jacks Crist Infante, PA-C  REFERRING DIAG: Spondylosis without myelopathy or radiculopathy   Rationale for Evaluation and Treatment: Rehabilitation  THERAPY DIAG:  Other low back pain  Other symptoms and signs involving the musculoskeletal system  Muscle weakness (generalized)  ONSET DATE: 09/15/22; history of LBP ~ 6 years   SUBJECTIVE:  SUBJECTIVE STATEMENT: Patient reports 3-4/10 pain in low back today (R>L). Patient is compliant with HEP.  EVAL: Patient reports that he has continued to have back pain since he was last in Physical Therapy. He has seen neurologist and received several injections including an ablation with no change in pain.   PERTINENT HISTORY:  HTN; hyperlipemia; Rt THA; obesity; back pain; prediabetes; sleep apnea  PAIN:  Are you having pain? Yes: NPRS scale: 4-5/10 Pain location: low back R > L  Pain description: sharp and dull  Aggravating factors: getting up from sitting or lying down; lifting less than 10 # Relieving factors: meds;   PRECAUTIONS: None   WEIGHT BEARING RESTRICTIONS: No  FALLS:  Has patient fallen in last 6 months? 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  DIAGNOSTIC FINDINGS:  MRI 07/17/23: 1. Grade 1 retrolisthesis of L1 on L2 and L2 on L3 with associated disc degeneration and bulge at L2-L3 resulting in mild spinal canal stenosis with bilateral subarticular zone narrowing and possible irritation of either traversing L3 nerve root, and moderate bilateral neural foraminal stenosis. 2. Moderate left worse than right facet arthropathy at L4-L5 without significant spinal canal or neural foraminal stenosis. 3. Disc degeneration at L5-S1 with left foraminal and right subarticular zone protrusions resulting in mild left neural foraminal stenosis with possible contact of the exiting L5 nerve root, and right subarticular zone narrowing with possible contact of the traversing right S1 nerve root.   MUSCLE LENGTH: Hamstrings: Right 45 deg; Left 50 deg Thomas test: approximately - Right -30 deg; Left -25 deg  POSTURE: rounded shoulders, forward head, decreased lumbar lordosis, increased thoracic kyphosis, and extremely flexed trunk   PALPATION: Muscular tightness R > L hip flexors and R lumbar paraspinals   LUMBAR ROM:   AROM eval  Flexion 70% pulling   Extension 30%  Right lateral flexion 65% discomfort   Left lateral flexion 60%   Right rotation 20%  Left rotation 20%   (Blank rows = not tested)  LOWER EXTREMITY ROM:   tight R hip in all planes; bilat LE's limited ROM due to patient obesity    Approximate limitations in hip extension   Active  Right eval Left eval  Hip flexion    Hip extension -30 -25  Hip abduction    Hip adduction    Hip internal rotation     Hip external rotation    Knee flexion    Knee extension    Ankle dorsiflexion    Ankle plantarflexion    Ankle inversion    Ankle eversion     (Blank rows = not tested)  LOWER EXTREMITY MMT:    MMT Right eval Left eval  Hip flexion 4 4+  Hip extension 4- 4-  Hip abduction 4 4  Hip adduction    Hip internal rotation    Hip external rotation    Knee flexion 5 5  Knee extension 5 5  Ankle dorsiflexion    Ankle plantarflexion    Ankle inversion    Ankle eversion     (Blank rows = not tested)  LUMBAR SPECIAL TESTS:  Straight leg raise  test: Negative and Slump test: Negative SLS - unable to stand on one leg without UE support; 10 sec with UE support   FUNCTIONAL TESTS:  5 times sit to stand: 12.98  GAIT: Distance walked: 40 ft Assistive device utilized: None Level of assistance: Complete Independence Comments: forward flexed posture through trunk and hips bilat; poor trunk stability with lateral shift of upper body opposite to wt shift    [facility] Adult PT Treatment:                                                DATE: 07/08/2023 Therapeutic Exercise: Fwd walking on treadmill --> 1.4 mph, 0% incline x 3 min Bkwd walking on treadmill --> 0.7 mph, 0% incline x Standing: Back to wall lifting hips away from wall 10 sec hold x 10   Row blue TB 10x5" Shoulder extension blue TB 10x3"  Paloff press blue TB 10x3" (B) Hip extension forearms on counter 12x3" (B) Resisted walking with pulley: fwd/bkwd/side stepping 10#  Prone: Quad stretch w/ strap 3x30" (B) Prone prop on elbows  Hip extension x10 (B) Seated: Hip flexor stretch x30" (B)   [facility] Adult PT Treatment:                                                DATE: 07/06/23 Therapeutic Exercise: Treadmill .7 mph x 5 min walking backward working on posture and alignment.  Standing Back to wall lifting hips away from wall 10 sec hold x 10   Row blue TB 5 sec x 10  Antirotation blue TB 3 sec x 10 R/L Sports cord  stepping back 10 sec x 5  Hip extension forearms on counter 3 sec x 10 x R/L  Hip flexor stretch one foot on 12 inch step partial lunge 30 sec x 3 R/L  Sitting  Hip flexor stretch 30 sec x 3 R/L  Prone  Quad stretch w/ strap 30 sec x 3 R/L  Glut set 10 sec x 10  Lying prone ~ 3-4 min Prone prop prone on elbows  Quad set toe resting on table 3 sec x 10 R/L  Hip extension 3 sec x 10 R/L  Supine  Piriformis stretch 30 sec x 2 R/L  Hamstring with strap 30 sec x 2 R/L  Hip flexor 30 sec x 2 R/L   [facility] Adult PT Treatment:                                                DATE: 07/02/23 Therapeutic Exercise: Treadmill .7 mph x 5 min walking backward working on posture and alignment.  Standing Back to wall lifting hips away from wall 10 sec hold x 10   Isometric row blue TB 5 sec x 10  Antirotation blue TB 3 sec x 10  Hip extension forearms on counter 3 sec x 10 x R/L  Hip flexor stretch one foot on 12 inch step partial lunge 30 sec x 3 R/L  Sitting  Hip flexor stretch 30 sec x 3 R/L  Prone  Hip flexor stretch at table  one leg on one foot on floor 30 sec x 2 Reviewed for HEP  Lying prone ~ 3-4 min Quad set toe resting on table 3 sec x 10 R/L  Quad stretch 30 sec x 2 R/L Glut set 10 sec x 10  Hip extension 3 sec x 10 R/L  Supine  Piriformis stretch 30 sec x 2 R/L  Hamstring with strap 30 sec x 2 R/L  Hip flexor 30 sec x 2 R/L   PATIENT EDUCATION:  Education details: POC; HEP  Person educated: Patient Education method: Programmer, multimedia, Demonstration, Tactile cues, Verbal cues, and Handouts Education comprehension: verbalized understanding, returned demonstration, verbal cues required, tactile cues required, and needs further education  HOME EXERCISE PROGRAM: Access Code: [email or web address] URL: [email or web address] Date: 07/06/2023 Prepared by: Corlis Leak  Exercises - Supine Piriformis Stretch with Leg Straight  - 2 x daily - 7 x weekly - 1 sets - 3 reps - 30 sec  hold -  Hooklying Hamstring Stretch with Strap  - 2 x daily - 7 x weekly - 1 sets - 3 reps - 30 sec  hold - Hip Flexor Stretch at Edge of Bed  - 2 x daily - 7 x weekly - 1 sets - 3 reps - 30 sec  hold - Correct Standing Posture  - 2 x daily - 7 x weekly - 1 sets - 3 reps - 30 sec  hold - Seated Hip Flexor Stretch  - 2 x daily - 7 x weekly - 1 sets - 3 reps - 30 sec  hold - Standing Hip Extension with Counter Support  - 2 x daily - 7 x weekly - 1-2 sets - 10 reps - 3-5 sec  hold - Standing Hip Abduction with Unilateral Counter Support  - 2 x daily - 7 x weekly - 1 sets - 10 reps - 3-5 sec  hold - Standing Hip Flexor Stretch  - 2 x daily - 7 x weekly - 1 sets - 3-5 reps - 10 sec  hold - Standing Knee Flexion Stretch on Step  - 2 x daily - 7 x weekly - 1 sets - 5-10 reps - 1-20 sec  hold - Standing Hip Extension with Unilateral Counter Support  - 2 x daily - 7 x weekly - 1 sets - 3 reps - 30 sec  hold - Standing Shoulder Row Reactive Isometric  - 2 x daily - 7 x weekly - 1 sets - 10 reps - 30-45 sec  hold - Anti-Rotation Lateral Stepping with Press  - 2 x daily - 7 x weekly - 1-2 sets - 10 reps - 2-3 sec  hold - Supine Diaphragmatic Breathing  - 2 x daily - 7 x weekly - 1 sets - 10 reps - 4-6 sec  hold  ASSESSMENT:  CLINICAL IMPRESSION: Hip strengthening and stretches continued to progress upright postural alignment. Frequent cueing provided during treadmill warm-up to decrease forward flexed hip posture. Resisted walking continued in all directions; decreased step length noted during backwards stepping.   EVAL: Patient is a 72 y.o. male who was seen today for physical therapy evaluation and treatment for lumbar spondylosis. Patient has a history of LBP over the past several years. He was treated by neurologist with injection and ablation with no change in symptoms. Patient presents with chronic R > L LBP; poor posture and alignment; limited trunk ROM/mobility; LE and core weakness; pain limiting functional  activities and ADL's. Patient will benefit from PT to address problems identified.  OBJECTIVE IMPAIRMENTS: Abnormal gait, decreased activity tolerance, decreased balance, decreased mobility, difficulty walking, decreased ROM, decreased strength, hypomobility, increased fascial restrictions, impaired flexibility, improper body mechanics, postural dysfunction, and pain.    GOALS: Goals reviewed with patient? Yes  SHORT TERM GOALS: Target date: 07/29/2023  Independent in initial HEP Baseline: Goal status: met   2.  Improve hip extension with patient to demonstrate improve upright posture and alignment.  Baseline:  Goal status: on going    LONG TERM GOALS: Target date: 09/09/2023  Increase LE strength to 4/5 to 5/5 througout Baseline:  Goal status: INITIAL  2.  Improve bilat hip extension to neutral  Baseline:  Goal status: INITIAL  3.  Decrease LBP by 50-70% allowing patient to increase functional activity level  Baseline:  Goal status: INITIAL  4.  Decrease 5 times sit to stand to 10 seconds  Baseline: 12.98 Goal status: INITIAL  5.  Independent in HEP, including aquatic program as indicated  Baseline:  Goal status: INITIAL  6.  Improve functional limitation score to 53 Baseline: 40 Goal status: INITIAL  PLAN:  PT FREQUENCY: 2x/week  PT DURATION: 12 weeks  PLANNED INTERVENTIONS: Therapeutic exercises, Therapeutic activity, Neuromuscular re-education, Balance training, Gait training, Patient/Family education, Self Care, Joint mobilization, Stair training, Aquatic Therapy, Dry Needling, Electrical stimulation, Spinal mobilization, Cryotherapy, Moist heat, Taping, Traction, Ultrasound, Ionotophoresis 4mg /ml Dexamethasone, Manual therapy, and Re-evaluation.  PLAN FOR NEXT SESSION: review and progress HEP; continue back care and body mechanics education; manual work, DN, modalities as indicated    Sanjuana Mae, PTA 07/08/2023, 9:47 AM

## 2023-07-13 ENCOUNTER — Encounter: Payer: Self-pay | Admitting: Rehabilitative and Restorative Service Providers"

## 2023-07-13 ENCOUNTER — Ambulatory Visit: Payer: Medicare PPO | Admitting: Rehabilitative and Restorative Service Providers"

## 2023-07-13 DIAGNOSIS — M6281 Muscle weakness (generalized): Secondary | ICD-10-CM

## 2023-07-13 DIAGNOSIS — R898 Other abnormal findings in specimens from other organs, systems and tissues: Secondary | ICD-10-CM | POA: Diagnosis not present

## 2023-07-13 DIAGNOSIS — M47816 Spondylosis without myelopathy or radiculopathy, lumbar region: Secondary | ICD-10-CM | POA: Diagnosis not present

## 2023-07-13 DIAGNOSIS — R29898 Other symptoms and signs involving the musculoskeletal system: Secondary | ICD-10-CM

## 2023-07-13 DIAGNOSIS — M5459 Other low back pain: Secondary | ICD-10-CM | POA: Diagnosis not present

## 2023-07-13 NOTE — Therapy (Signed)
OUTPATIENT PHYSICAL THERAPY THORACOLUMBAR TREATMENT   Patient Name: John Morrow MRN: [id number] DOB:1951-08-19, 72 y.o., male Today's Date: 07/13/2023  END OF SESSION:  PT End of Session - 07/13/23 0804     Visit Number 8    Number of Visits 24    Date for PT Re-Evaluation 09/10/23    Authorization Type [employer] copay $[id number]    Authorization Time Period 12 VISITS APPROVED FOR PT 06/17/2023-09/10/2023    Authorization - Visit Number 8    Authorization - Number of Visits 12    Progress Note Due on Visit 10    PT Start Time 0800    PT Stop Time 0848    PT Time Calculation (min) 48 min    Activity Tolerance Patient tolerated treatment well              Past Medical History:  Diagnosis Date   Allergy    Arthritis    Back pain    Diverticulosis of colon without hemorrhage 06/12/2016   On Colonoscopy 11/06/2010   HLD (hyperlipidemia) 05/29/2016   Hyperlipemia    Hypertension    Joint pain    Morbid obesity (HCC) 05/29/2016   Prediabetes    Right hip pain    needs hip replacement   Sleep apnea    on CPAP   Past Surgical History:  Procedure Laterality Date   EYE SURGERY     age 20   JOINT REPLACEMENT     Hip   KNEE SURGERY     REPLACEMENT TOTAL HIP W/  RESURFACING IMPLANTS Right 03/2019   Dr. Marcell Barlow   Patient Active Problem List   Diagnosis Date Noted   Myalgia 01/07/2023   Cognitive changes 01/07/2023   COVID-19 09/02/2022   Abnormal CT scan of head 09/02/2022   Lumbar radiculopathy 07/16/2022   Acute pancreatitis 04/07/2022   OSA (obstructive sleep apnea) 02/19/2022   Gout 12/24/2021   Chronic rhinitis 08/21/2021   Impotence of organic origin 02/13/2021   Lumbar spondylosis 10/09/2020   Pre-operative exam 07/18/2020   Verruca 05/01/2020   Epiploic appendagitis 02/20/2020   Bilateral shoulder region arthritis 04/13/2019   Vitamin D deficiency 02/08/2019   Insulin resistance 02/08/2019   Class 2 severe obesity with serious comorbidity and  body mass index (BMI) of 39.0 to 39.9 in adult (HCC) 02/08/2019   Prediabetes 02/08/2019   Chronic pain 09/28/2018   Hip pain 06/28/2018   Dependent edema 05/05/2017   Primary osteoarthritis of left knee 09/25/2016   BPH (benign prostatic hyperplasia) 05/29/2016   HTN (hypertension) 05/29/2016   HLD (hyperlipidemia) 05/29/2016   Class 2 obesity due to excess calories without serious comorbidity with body mass index (BMI) of 39.0 to 39.9 in adult 05/29/2016   Sleep apnea 03/31/2014    PCP: Dr Everrett Coombe   REFERRING PROVIDER: Clent Jacks Crist Infante, PA-C  REFERRING DIAG: Spondylosis without myelopathy or radiculopathy   Rationale for Evaluation and Treatment: Rehabilitation  THERAPY DIAG:  Other low back pain  Other symptoms and signs involving the musculoskeletal system  Muscle weakness (generalized)  ONSET DATE: 09/15/22; history of LBP ~ 6 years   SUBJECTIVE:  SUBJECTIVE STATEMENT: Patient reports that his low back today is feeling better. Generally having less pain. Would like to try the dry needling.   EVAL: Patient reports that he has continued to have back pain since he was last in Physical Therapy. He has seen neurologist and received several injections including an ablation with no change in pain.   PERTINENT HISTORY:  HTN; hyperlipemia; Rt THA; obesity; back pain; prediabetes; sleep apnea  PAIN:  Are you having pain? Yes: NPRS scale: 1/10 Pain location: low back R > L  Pain description: sharp and dull  Aggravating factors: getting up from sitting or lying down; lifting less than 10 # Relieving factors: meds;   PRECAUTIONS: None   WEIGHT BEARING RESTRICTIONS: No  FALLS:  Has patient fallen in last 6 months? 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  DIAGNOSTIC FINDINGS:  MRI 07/17/23: 1. Grade 1 retrolisthesis of L1 on L2 and L2 on L3 with associated disc degeneration and bulge at L2-L3 resulting in mild spinal canal stenosis with bilateral subarticular zone narrowing and possible irritation of either traversing L3 nerve root, and moderate bilateral neural foraminal stenosis. 2. Moderate left worse than right facet arthropathy at L4-L5 without significant spinal canal or neural foraminal stenosis. 3. Disc degeneration at L5-S1 with left foraminal and right subarticular zone protrusions resulting in mild left neural foraminal stenosis with possible contact of the exiting L5 nerve root, and right subarticular zone narrowing with possible contact of the traversing right S1 nerve root.   MUSCLE LENGTH: Hamstrings: Right 45 deg; Left 50 deg Thomas test: approximately - Right -30 deg; Left -25 deg  POSTURE: rounded shoulders, forward head, decreased lumbar lordosis, increased thoracic kyphosis, and extremely flexed trunk   PALPATION: Muscular tightness R > L hip flexors and R lumbar paraspinals   LUMBAR ROM:   AROM eval  Flexion 70% pulling   Extension 30%  Right lateral flexion 65% discomfort   Left lateral flexion 60%   Right rotation 20%  Left rotation 20%   (Blank rows = not tested)  LOWER EXTREMITY ROM:   tight R hip in all planes; bilat LE's limited ROM due to patient obesity    Approximate limitations in hip extension   Active  Right eval Left eval  Hip flexion    Hip extension -30 -25  Hip abduction    Hip adduction    Hip internal rotation    Hip external rotation     Knee flexion    Knee extension    Ankle dorsiflexion    Ankle plantarflexion    Ankle inversion    Ankle eversion     (Blank rows = not tested)  LOWER EXTREMITY MMT:    MMT Right eval Left eval  Hip flexion 4 4+  Hip extension 4- 4-  Hip abduction 4 4  Hip adduction    Hip internal rotation    Hip external rotation    Knee flexion 5 5  Knee extension 5 5  Ankle dorsiflexion    Ankle plantarflexion    Ankle inversion    Ankle eversion     (Blank rows = not tested)  LUMBAR SPECIAL TESTS:  Straight leg raise test: Negative and Slump test: Negative SLS - unable to stand on one leg without UE support; 10 sec with UE support   FUNCTIONAL TESTS:  5 times sit to stand: 12.98  GAIT: Distance walked: 40 ft Assistive device utilized: None Level of assistance: Complete Independence Comments: forward flexed posture through trunk and hips bilat; poor trunk stability with lateral shift of upper body opposite to wt shift    [facility] Adult PT Treatment:                                                DATE: 07/13/23 Therapeutic Exercise: Treadmill 1.0 mph x 5 min walking backward working on posture and alignment.  Manual  STM lumbar paraspinals to QL  Skilled palpation to assess response to manual work and dry needling  Trigger Point Dry-Needling  Treatment instructions: Expect mild to moderate muscle soreness. Patient verbalized understanding of these instructions and education.  Patient Consent Given: Yes Education handout provided: Previously provided Muscles treated: lumbar paraspinals; QL R  Electrical stimulation performed: Yes Parameters:  mAmp current intensity to patient tolerance  Treatment response/outcome: decreased palpable tightness; decreased tenderness  Standing  TA activation  SLS 20 sec x 3 R/L  Sitting  Anterior/posterior pelvic tilt without and with dynadisc  Hip flexor stretch 30 sec x 3 R/L  Modalities:   Moist heat lumbar spine x 10 min   [facility] Adult  PT Treatment:                                                DATE: 07/08/2023 Therapeutic Exercise: Fwd walking on treadmill --> 1.4 mph, 0% incline x 3 min Bkwd walking on treadmill --> 0.7 mph, 0% incline x Standing: Back to wall lifting hips away from wall 10 sec hold x 10   Row blue TB 10x5" Shoulder extension blue TB 10x3"  Paloff press blue TB 10x3" (B) Hip extension forearms on counter 12x3" (B) Resisted walking with pulley: fwd/bkwd/side stepping 10#  Prone: Quad stretch w/ strap 3x30" (B) Prone prop on elbows  Hip extension x10 (B) Seated: Hip flexor stretch x30" (B)   [facility] Adult PT Treatment:                                                DATE: 07/06/23 Therapeutic Exercise: Treadmill .7 mph x 5 min walking backward working on posture and alignment.  Standing Back to wall lifting hips away from wall 10 sec hold x 10   Row blue TB 5 sec x 10  Antirotation blue TB 3 sec x 10 R/L Sports cord stepping back 10 sec x 5  Hip extension forearms on counter 3 sec x 10 x R/L  Hip flexor stretch one foot on 12 inch step partial lunge 30 sec x 3 R/L  Sitting  Hip flexor stretch 30 sec x 3 R/L  Prone  Quad stretch w/ strap 30 sec x 3 R/L  Glut set 10 sec x 10  Lying prone ~ 3-4 min Prone prop prone on elbows  Quad set toe resting on table 3 sec x 10 R/L  Hip extension 3 sec x 10 R/L  Supine  Piriformis stretch 30 sec x 2 R/L  Hamstring with strap 30 sec x 2 R/L  Hip flexor 30 sec x 2 R/L   PATIENT EDUCATION:  Education details: POC; HEP  Person educated: Patient Education method: Programmer, multimedia, Demonstration, Tactile cues, Verbal cues, and Handouts Education comprehension: verbalized understanding, returned demonstration, verbal cues required, tactile cues required, and needs further education  HOME EXERCISE PROGRAM: Access Code: [email or web address] URL: [email or web address] Date: 07/06/2023 Prepared by: Corlis Leak  Exercises - Supine Piriformis Stretch  with Leg Straight  - 2 x daily - 7 x weekly - 1 sets - 3 reps - 30 sec  hold - Hooklying Hamstring Stretch with Strap  - 2 x daily - 7 x weekly - 1 sets - 3 reps - 30 sec  hold - Hip Flexor Stretch at Edge of Bed  - 2 x daily - 7 x weekly - 1 sets - 3 reps - 30 sec  hold - Correct Standing Posture  - 2 x daily - 7 x weekly - 1 sets - 3 reps - 30 sec  hold - Seated Hip Flexor Stretch  - 2 x daily - 7 x weekly - 1 sets - 3 reps - 30 sec  hold - Standing Hip Extension with Counter Support  - 2 x daily - 7 x weekly - 1-2 sets - 10 reps - 3-5 sec  hold - Standing Hip Abduction with Unilateral Counter Support  - 2 x daily - 7 x weekly - 1 sets - 10 reps - 3-5 sec  hold - Standing Hip Flexor Stretch  - 2 x daily - 7 x weekly - 1 sets - 3-5 reps - 10 sec  hold - Standing Knee Flexion Stretch on Step  - 2 x daily - 7 x weekly - 1 sets - 5-10 reps - 1-20 sec  hold - Standing Hip Extension with Unilateral Counter Support  - 2 x daily - 7 x weekly - 1 sets - 3 reps - 30 sec  hold - Standing Shoulder Row Reactive Isometric  - 2 x daily - 7 x weekly - 1 sets - 10 reps - 30-45 sec  hold - Anti-Rotation Lateral Stepping with Press  - 2 x daily - 7 x weekly - 1-2 sets - 10 reps - 2-3 sec  hold - Supine Diaphragmatic Breathing  - 2 x daily - 7 x weekly - 1 sets - 10 reps - 4-6 sec  hold  ASSESSMENT:  CLINICAL IMPRESSION: Patient reports that back pain is decreased. Note improved upright posture and alignment. Increased speed with backward walking. Continued with hip strengthening and stretches continued to progress upright postural alignment. Worked on segmental mobility in siting with and without the dynadisc. Patient has difficulty isolating lumbar segmental movement.    EVAL: Patient is a 72 y.o. male who was seen today for physical therapy evaluation and treatment for lumbar spondylosis. Patient has a history of LBP over the past several years. He was treated by neurologist with injection and ablation with no  change in symptoms. Patient presents with chronic R > L LBP; poor posture and alignment; limited trunk ROM/mobility; LE and core weakness; pain limiting functional activities and ADL's. Patient will benefit from PT to address problems identified.     OBJECTIVE IMPAIRMENTS: Abnormal gait, decreased activity tolerance, decreased balance, decreased mobility, difficulty walking, decreased ROM, decreased  strength, hypomobility, increased fascial restrictions, impaired flexibility, improper body mechanics, postural dysfunction, and pain.    GOALS: Goals reviewed with patient? Yes  SHORT TERM GOALS: Target date: 07/29/2023  Independent in initial HEP Baseline: Goal status: met   2.  Improve hip extension with patient to demonstrate improve upright posture and alignment.  Baseline:  Goal status: on going    LONG TERM GOALS: Target date: 09/09/2023  Increase LE strength to 4/5 to 5/5 througout Baseline:  Goal status: INITIAL  2.  Improve bilat hip extension to neutral  Baseline:  Goal status: INITIAL  3.  Decrease LBP by 50-70% allowing patient to increase functional activity level  Baseline:  Goal status: INITIAL  4.  Decrease 5 times sit to stand to 10 seconds  Baseline: 12.98 Goal status: INITIAL  5.  Independent in HEP, including aquatic program as indicated  Baseline:  Goal status: INITIAL  6.  Improve functional limitation score to 53 Baseline: 40 Goal status: INITIAL  PLAN:  PT FREQUENCY: 2x/week  PT DURATION: 12 weeks  PLANNED INTERVENTIONS: Therapeutic exercises, Therapeutic activity, Neuromuscular re-education, Balance training, Gait training, Patient/Family education, Self Care, Joint mobilization, Stair training, Aquatic Therapy, Dry Needling, Electrical stimulation, Spinal mobilization, Cryotherapy, Moist heat, Taping, Traction, Ultrasound, Ionotophoresis 4mg /ml Dexamethasone, Manual therapy, and Re-evaluation.  PLAN FOR NEXT SESSION: review and progress HEP;  continue back care and body mechanics education; manual work, DN, modalities as indicated    [employer], PT 07/13/2023, 8:05 AM

## 2023-07-15 ENCOUNTER — Ambulatory Visit: Payer: Medicare PPO | Admitting: Rehabilitative and Restorative Service Providers"

## 2023-07-15 ENCOUNTER — Encounter: Payer: Self-pay | Admitting: Rehabilitative and Restorative Service Providers"

## 2023-07-15 DIAGNOSIS — M6281 Muscle weakness (generalized): Secondary | ICD-10-CM

## 2023-07-15 DIAGNOSIS — M5459 Other low back pain: Secondary | ICD-10-CM | POA: Diagnosis not present

## 2023-07-15 DIAGNOSIS — R898 Other abnormal findings in specimens from other organs, systems and tissues: Secondary | ICD-10-CM | POA: Diagnosis not present

## 2023-07-15 DIAGNOSIS — R29898 Other symptoms and signs involving the musculoskeletal system: Secondary | ICD-10-CM

## 2023-07-15 DIAGNOSIS — M47816 Spondylosis without myelopathy or radiculopathy, lumbar region: Secondary | ICD-10-CM | POA: Diagnosis not present

## 2023-07-15 NOTE — Therapy (Signed)
OUTPATIENT PHYSICAL THERAPY THORACOLUMBAR TREATMENT   Patient Name: John Morrow MRN: [id number] DOB:1951-07-10, 72 y.o., male Today's Date: 07/15/2023  END OF SESSION:  PT End of Session - 07/15/23 0809     Visit Number 9    Number of Visits 24    Date for PT Re-Evaluation 09/10/23    Authorization Type [employer] copay $20    Authorization Time Period 12 VISITS APPROVED FOR PT 06/17/2023-09/10/2023    Authorization - Visit Number 9    Authorization - Number of Visits 12    Progress Note Due on Visit 10    PT Start Time 0800    PT Stop Time 0845    PT Time Calculation (min) 45 min    Activity Tolerance Patient tolerated treatment well              Past Medical History:  Diagnosis Date   Allergy    Arthritis    Back pain    Diverticulosis of colon without hemorrhage 06/12/2016   On Colonoscopy 11/06/2010   HLD (hyperlipidemia) 05/29/2016   Hyperlipemia    Hypertension    Joint pain    Morbid obesity (HCC) 05/29/2016   Prediabetes    Right hip pain    needs hip replacement   Sleep apnea    on CPAP   Past Surgical History:  Procedure Laterality Date   EYE SURGERY     age 9   JOINT REPLACEMENT     Hip   KNEE SURGERY     REPLACEMENT TOTAL HIP W/  RESURFACING IMPLANTS Right 03/2019   Dr. Marcell Barlow   Patient Active Problem List   Diagnosis Date Noted   Myalgia 01/07/2023   Cognitive changes 01/07/2023   COVID-19 09/02/2022   Abnormal CT scan of head 09/02/2022   Lumbar radiculopathy 07/16/2022   Acute pancreatitis 04/07/2022   OSA (obstructive sleep apnea) 02/19/2022   Gout 12/24/2021   Chronic rhinitis 08/21/2021   Impotence of organic origin 02/13/2021   Lumbar spondylosis 10/09/2020   Pre-operative exam 07/18/2020   Verruca 05/01/2020   Epiploic appendagitis 02/20/2020   Bilateral shoulder region arthritis 04/13/2019   Vitamin D deficiency 02/08/2019   Insulin resistance 02/08/2019   Class 2 severe obesity with serious comorbidity and  body mass index (BMI) of 39.0 to 39.9 in adult (HCC) 02/08/2019   Prediabetes 02/08/2019   Chronic pain 09/28/2018   Hip pain 06/28/2018   Dependent edema 05/05/2017   Primary osteoarthritis of left knee 09/25/2016   BPH (benign prostatic hyperplasia) 05/29/2016   HTN (hypertension) 05/29/2016   HLD (hyperlipidemia) 05/29/2016   Class 2 obesity due to excess calories without serious comorbidity with body mass index (BMI) of 39.0 to 39.9 in adult 05/29/2016   Sleep apnea 03/31/2014    PCP: Dr Everrett Coombe   REFERRING PROVIDER: Clent Jacks Crist Infante, PA-C  REFERRING DIAG: Spondylosis without myelopathy or radiculopathy   Rationale for Evaluation and Treatment: Rehabilitation  THERAPY DIAG:  Other low back pain  Other symptoms and signs involving the musculoskeletal system  Muscle weakness (generalized)  ONSET DATE: 09/15/22; history of LBP ~ 6 years   SUBJECTIVE:  SUBJECTIVE STATEMENT: Patient reports that he can not tell a difference with the dry needling. His back pain is generally decreasing in intensity. He has periods of time during the day when he has no pain.   EVAL: Patient reports that he has continued to have back pain since he was last in Physical Therapy. He has seen neurologist and received several injections including an ablation with no change in pain.   PERTINENT HISTORY:  HTN; hyperlipemia; Rt THA; obesity; back pain; prediabetes; sleep apnea  PAIN:  Are you having pain? Yes: NPRS scale: 1-2/10 Pain location: low back R > L  Pain description: sharp and dull  Aggravating factors: getting up from sitting or lying down; lifting less than 10 # Relieving factors: meds;   PRECAUTIONS: None   WEIGHT BEARING RESTRICTIONS: No  FALLS:  Has patient fallen in last 6  months? 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  DIAGNOSTIC FINDINGS:  MRI 07/17/23: 1. Grade 1 retrolisthesis of L1 on L2 and L2 on L3 with associated disc degeneration and bulge at L2-L3 resulting in mild spinal canal stenosis with bilateral subarticular zone narrowing and possible irritation of either traversing L3 nerve root, and moderate bilateral neural foraminal stenosis. 2. Moderate left worse than right facet arthropathy at L4-L5 without significant spinal canal or neural foraminal stenosis. 3. Disc degeneration at L5-S1 with left foraminal and right subarticular zone protrusions resulting in mild left neural foraminal stenosis with possible contact of the exiting L5 nerve root, and right subarticular zone narrowing with possible contact of the traversing right S1 nerve root.   MUSCLE LENGTH: Hamstrings: Right 45 deg; Left 50 deg Thomas test: approximately - Right -30 deg; Left -25 deg  07/15/23:  Hamstrings: Right 55 deg; Left 60 deg Thomas test: approximately - Right -20 deg; Left -20 deg  POSTURE: rounded shoulders, forward head, decreased lumbar lordosis, increased thoracic kyphosis, and extremely flexed trunk   PALPATION: Muscular tightness R > L hip flexors and R lumbar paraspinals   LUMBAR ROM:   AROM eval  Flexion 70% pulling   Extension 30%  Right lateral flexion 65% discomfort   Left lateral flexion 60%   Right rotation 20%  Left rotation 20%   (Blank rows = not tested)  LOWER EXTREMITY ROM:   tight R hip in all planes; bilat LE's limited ROM due to patient obesity    Approximate limitations in  hip extension   Active  Right eval Left eval  Hip flexion    Hip extension -30 -25  Hip abduction    Hip adduction    Hip internal rotation    Hip external rotation    Knee flexion    Knee extension    Ankle dorsiflexion    Ankle plantarflexion    Ankle inversion    Ankle eversion     (Blank rows = not tested)  LOWER EXTREMITY MMT:    MMT Right eval Left eval  Hip flexion 4 4+  Hip extension 4- 4-  Hip abduction 4 4  Hip adduction    Hip internal rotation    Hip external rotation    Knee flexion 5  5  Knee extension 5 5  Ankle dorsiflexion    Ankle plantarflexion    Ankle inversion    Ankle eversion     (Blank rows = not tested)  LUMBAR SPECIAL TESTS:  Straight leg raise test: Negative and Slump test: Negative SLS - unable to stand on one leg without UE support; 10 sec with UE support   FUNCTIONAL TESTS:  5 times sit to stand: 12.98  GAIT: Distance walked: 40 ft Assistive device utilized: None Level of assistance: Complete Independence Comments: forward flexed posture through trunk and hips bilat; poor trunk stability with lateral shift of upper body opposite to wt shift    [facility] Adult PT Treatment:                                                DATE: 07/15/23 Therapeutic Exercise: Treadmill 1.0 mph x 5 min walking backward working on posture and alignment.    Standing  Hip flexor stretch using strap at door leaning back 30-45 sec x 3  TA activation  SLS 20 sec x 3 R/L  Row blue TB x 20 Palloff press blue TB x 15 R/L  Hip extension with hold 3 sec x10 R/L  Supine  Hip flexor stretch 30 sec x 2 R/L  Hamstring stretch w/ strap 30 sec x 2 R/L Sitting  Anterior/posterior pelvic tilt without and with dynadisc   Modalities:   Moist heat lumbar spine x 10 min    [facility] Adult PT Treatment:                                                DATE: 07/13/23 Therapeutic Exercise: Treadmill 1.0 mph x 5 min walking backward working on posture and alignment.   Manual  STM lumbar paraspinals to QL  Skilled palpation to assess response to manual work and dry needling  Trigger Point Dry-Needling  Treatment instructions: Expect mild to moderate muscle soreness. Patient verbalized understanding of these instructions and education.  Patient Consent Given: Yes Education handout provided: Previously provided Muscles treated: lumbar paraspinals; QL R  Electrical stimulation performed: Yes Parameters:  mAmp current intensity to patient tolerance  Treatment response/outcome: decreased palpable tightness; decreased tenderness  Standing  TA activation  SLS 20 sec x 3 R/L  Sitting  Anterior/posterior pelvic tilt without and with dynadisc  Hip flexor stretch 30 sec x 3 R/L  Modalities:   Moist heat lumbar spine x 10 min   [facility] Adult PT Treatment:                                                DATE: 07/08/2023 Therapeutic Exercise: Fwd walking on treadmill --> 1.4 mph, 0% incline x 3 min Bkwd walking on treadmill --> 0.7 mph, 0% incline x Standing: Back to wall lifting hips away from wall 10 sec hold x 10   Row blue TB 10x5" Shoulder extension blue TB 10x3"  Paloff press blue TB 10x3" (B) Hip extension forearms on counter 12x3" (B) Resisted walking with pulley: fwd/bkwd/side stepping 10#  Prone: Quad stretch w/ strap 3x30" (B)  Prone prop on elbows  Hip extension x10 (B) Seated: Hip flexor stretch x30" (B)   PATIENT EDUCATION:  Education details: POC; HEP  Person educated: Patient Education method: Explanation, Demonstration, Tactile cues, Verbal cues, and Handouts Education comprehension: verbalized understanding, returned demonstration, verbal cues required, tactile cues required, and needs further education  HOME EXERCISE PROGRAM: Access Code: [email or web address] URL: [email or web address] Date: 07/06/2023 Prepared by: Corlis Leak  Exercises - Supine Piriformis Stretch with Leg Straight  - 2 x daily - 7 x weekly - 1 sets - 3  reps - 30 sec  hold - Hooklying Hamstring Stretch with Strap  - 2 x daily - 7 x weekly - 1 sets - 3 reps - 30 sec  hold - Hip Flexor Stretch at Edge of Bed  - 2 x daily - 7 x weekly - 1 sets - 3 reps - 30 sec  hold - Correct Standing Posture  - 2 x daily - 7 x weekly - 1 sets - 3 reps - 30 sec  hold - Seated Hip Flexor Stretch  - 2 x daily - 7 x weekly - 1 sets - 3 reps - 30 sec  hold - Standing Hip Extension with Counter Support  - 2 x daily - 7 x weekly - 1-2 sets - 10 reps - 3-5 sec  hold - Standing Hip Abduction with Unilateral Counter Support  - 2 x daily - 7 x weekly - 1 sets - 10 reps - 3-5 sec  hold - Standing Hip Flexor Stretch  - 2 x daily - 7 x weekly - 1 sets - 3-5 reps - 10 sec  hold - Standing Knee Flexion Stretch on Step  - 2 x daily - 7 x weekly - 1 sets - 5-10 reps - 1-20 sec  hold - Standing Hip Extension with Unilateral Counter Support  - 2 x daily - 7 x weekly - 1 sets - 3 reps - 30 sec  hold - Standing Shoulder Row Reactive Isometric  - 2 x daily - 7 x weekly - 1 sets - 10 reps - 30-45 sec  hold - Anti-Rotation Lateral Stepping with Press  - 2 x daily - 7 x weekly - 1-2 sets - 10 reps - 2-3 sec  hold - Supine Diaphragmatic Breathing  - 2 x daily - 7 x weekly - 1 sets - 10 reps - 4-6 sec  hold  ASSESSMENT:  CLINICAL IMPRESSION: Patient reports that back pain is decreased and he is now having some periods during the day when he has no pain. Continued with hip strengthening and stretches continued to progress upright postural alignment. Worked on segmental mobility in siting with and without the dynadisc. Patient has difficulty isolating lumbar segmental movement.    EVAL: Patient is a 72 y.o. male who was seen today for physical therapy evaluation and treatment for lumbar spondylosis. Patient has a history of LBP over the past several years. He was treated by neurologist with injection and ablation with no change in symptoms. Patient presents with chronic R > L LBP; poor posture  and alignment; limited trunk ROM/mobility; LE and core weakness; pain limiting functional activities and ADL's. Patient will benefit from PT to address problems identified.     OBJECTIVE IMPAIRMENTS: Abnormal gait, decreased activity tolerance, decreased balance, decreased mobility, difficulty walking, decreased ROM, decreased strength, hypomobility, increased fascial restrictions, impaired flexibility, improper body mechanics, postural dysfunction, and pain.    GOALS: Goals reviewed with patient? Yes  SHORT TERM GOALS:  Target date: 07/29/2023  Independent in initial HEP Baseline: Goal status: met   2.  Improve hip extension with patient to demonstrate improve upright posture and alignment.  Baseline:  Goal status: on going    LONG TERM GOALS: Target date: 09/09/2023  Increase LE strength to 4/5 to 5/5 througout Baseline:  Goal status: INITIAL  2.  Improve bilat hip extension to neutral  Baseline:  Goal status: INITIAL  3.  Decrease LBP by 50-70% allowing patient to increase functional activity level  Baseline:  Goal status: INITIAL  4.  Decrease 5 times sit to stand to 10 seconds  Baseline: 12.98 Goal status: INITIAL  5.  Independent in HEP, including aquatic program as indicated  Baseline:  Goal status: INITIAL  6.  Improve functional limitation score to 53 Baseline: 40 Goal status: INITIAL  PLAN:  PT FREQUENCY: 2x/week  PT DURATION: 12 weeks  PLANNED INTERVENTIONS: Therapeutic exercises, Therapeutic activity, Neuromuscular re-education, Balance training, Gait training, Patient/Family education, Self Care, Joint mobilization, Stair training, Aquatic Therapy, Dry Needling, Electrical stimulation, Spinal mobilization, Cryotherapy, Moist heat, Taping, Traction, Ultrasound, Ionotophoresis 4mg /ml Dexamethasone, Manual therapy, and Re-evaluation.  PLAN FOR NEXT SESSION: review and progress HEP; continue back care and body mechanics education; manual work, DN,  modalities as indicated    [employer], PT 07/15/2023, 8:10 AM

## 2023-07-20 ENCOUNTER — Ambulatory Visit: Payer: Medicare PPO | Attending: Neurosurgery | Admitting: Rehabilitative and Restorative Service Providers"

## 2023-07-20 ENCOUNTER — Encounter: Payer: Self-pay | Admitting: Rehabilitative and Restorative Service Providers"

## 2023-07-20 DIAGNOSIS — M6281 Muscle weakness (generalized): Secondary | ICD-10-CM | POA: Diagnosis not present

## 2023-07-20 DIAGNOSIS — R29898 Other symptoms and signs involving the musculoskeletal system: Secondary | ICD-10-CM | POA: Diagnosis not present

## 2023-07-20 DIAGNOSIS — M5459 Other low back pain: Secondary | ICD-10-CM

## 2023-07-20 NOTE — Therapy (Signed)
OUTPATIENT PHYSICAL THERAPY THORACOLUMBAR TREATMENT and MEDICARE10TH VISIT NOTE   Progress Note Reporting Period 06/17/23 to 07/20/23  See note below for Objective Data and Assessment of Progress/Goals.     Patient Name: John Morrow MRN: [id number] DOB:Jun 15, 1951, 72 y.o., male Today's Date: 07/20/2023  END OF SESSION:  PT End of Session - 07/20/23 0810     Visit Number 10    Number of Visits 24    Date for PT Re-Evaluation 09/10/23    Authorization Type [employer] copay $[id number]    Authorization Time Period 12 VISITS APPROVED FOR PT 06/17/2023-09/10/2023    Authorization - Visit Number 10    Authorization - Number of Visits 12    Progress Note Due on Visit 10    PT Start Time 0802    PT Stop Time 0845    PT Time Calculation (min) 43 min    Activity Tolerance Patient tolerated treatment well              Past Medical History:  Diagnosis Date   Allergy    Arthritis    Back pain    Diverticulosis of colon without hemorrhage 06/12/2016   On Colonoscopy 11/06/2010   HLD (hyperlipidemia) 05/29/2016   Hyperlipemia    Hypertension    Joint pain    Morbid obesity (HCC) 05/29/2016   Prediabetes    Right hip pain    needs hip replacement   Sleep apnea    on CPAP   Past Surgical History:  Procedure Laterality Date   EYE SURGERY     age 72   JOINT REPLACEMENT     Hip   KNEE SURGERY     REPLACEMENT TOTAL HIP W/  RESURFACING IMPLANTS Right 03/2019   Dr. Marcell Barlow   Patient Active Problem List   Diagnosis Date Noted   Myalgia 01/07/2023   Cognitive changes 01/07/2023   COVID-19 09/02/2022   Abnormal CT scan of head 09/02/2022   Lumbar radiculopathy 07/16/2022   Acute pancreatitis 04/07/2022   OSA (obstructive sleep apnea) 02/19/2022   Gout 12/24/2021   Chronic rhinitis 08/21/2021   Impotence of organic origin 02/13/2021   Lumbar spondylosis 10/09/2020   Pre-operative exam 07/18/2020   Verruca 05/01/2020   Epiploic appendagitis 02/20/2020   Bilateral  shoulder region arthritis 04/13/2019   Vitamin D deficiency 02/08/2019   Insulin resistance 02/08/2019   Class 2 severe obesity with serious comorbidity and body mass index (BMI) of 39.0 to 39.9 in adult (HCC) 02/08/2019   Prediabetes 02/08/2019   Chronic pain 09/28/2018   Hip pain 06/28/2018   Dependent edema 05/05/2017   Primary osteoarthritis of left knee 09/25/2016   BPH (benign prostatic hyperplasia) 05/29/2016   HTN (hypertension) 05/29/2016   HLD (hyperlipidemia) 05/29/2016   Class 2 obesity due to excess calories without serious comorbidity with body mass index (BMI) of 39.0 to 39.9 in adult 05/29/2016   Sleep apnea 03/31/2014    PCP: Dr Everrett Coombe   REFERRING PROVIDER: Clent Jacks Crist Infante, PA-C  REFERRING DIAG: Spondylosis without myelopathy or radiculopathy   Rationale for Evaluation and Treatment: Rehabilitation  THERAPY DIAG:  Other low back pain  Other symptoms and signs involving the musculoskeletal system  Muscle weakness (generalized)  ONSET DATE: 09/15/22; history of LBP ~ 6 years   SUBJECTIVE:  SUBJECTIVE STATEMENT: Patient reports that he was driving and sitting at a soccer tournament for his granddaughter for the past two days. He was also carrying chairs across soccer fields. He was not able to do exercises as he normally would. Notes increased pain today. His back pain is generally decreasing in intensity. He has periods of time during the day when he has no pain.   EVAL: Patient reports that he has continued to have back pain since he was last in Physical Therapy. He has seen neurologist and received several injections including an ablation with no change in pain.   PERTINENT HISTORY:  HTN; hyperlipemia; Rt THA; obesity; back pain; prediabetes; sleep  apnea  PAIN:  Are you having pain? Yes: NPRS scale: 4-5/10; best 0/10; at times 1-2/10 Pain location: low back R > L  Pain description: sharp and dull; mostly dull  Aggravating factors: sometimes with getting up from sitting or lying down but less frequent; lifting less than 10 # Relieving factors: meds; exercises  PRECAUTIONS: None   WEIGHT BEARING RESTRICTIONS: No  FALLS:  Has patient fallen in last 6 months? 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  DIAGNOSTIC FINDINGS:  MRI 07/17/23: 1. Grade 1 retrolisthesis of L1 on L2 and L2 on L3 with associated disc degeneration and bulge at L2-L3 resulting in mild spinal canal stenosis with bilateral subarticular zone narrowing and possible irritation of either traversing L3 nerve root, and moderate bilateral neural foraminal stenosis. 2. Moderate left worse than right facet arthropathy at L4-L5 without significant spinal canal or neural foraminal stenosis. 3. Disc degeneration at L5-S1 with left foraminal and right subarticular zone protrusions resulting in mild left neural foraminal stenosis with possible contact of the exiting L5 nerve root, and right subarticular zone narrowing with possible contact of the traversing right S1 nerve root.   MUSCLE LENGTH: Hamstrings: Right 45 deg; Left 50 deg Thomas test: approximately - Right -30 deg; Left -25 deg  07/15/23:  Hamstrings: Right 55 deg; Left 60 deg Thomas test: approximately - Right -20 deg; Left -20 deg  POSTURE: rounded shoulders, forward head, decreased lumbar lordosis, increased thoracic kyphosis, and extremely flexed  trunk   PALPATION: Muscular tightness R > L hip flexors and R lumbar paraspinals   LUMBAR ROM:   AROM eval  Flexion 70% pulling   Extension 30%  Right lateral flexion 65% discomfort   Left lateral flexion 60%   Right rotation 20%  Left rotation 20%   (Blank rows = not tested)  LOWER EXTREMITY ROM:   tight R hip in all planes; bilat LE's limited ROM due to patient obesity    Approximate limitations in hip extension    07/20/23: good improvement in hip extension Active  Right eval Right  07/20/23 Left eval Left  07/20/23  Hip flexion      Hip extension -30 -15 -25 -10  Hip abduction      Hip adduction      Hip internal rotation      Hip external rotation      Knee flexion      Knee extension      Ankle dorsiflexion  Ankle plantarflexion      Ankle inversion      Ankle eversion       (Blank rows = not tested)  LOWER EXTREMITY MMT:    MMT Right eval Right 07/20/23 Left eval Left 07/20/23  Hip flexion 4 5 4+ 5  Hip extension 4- 4 4- 4  Hip abduction 4 4+ 4 4+  Hip adduction      Hip internal rotation      Hip external rotation      Knee flexion 5  5   Knee extension 5  5   Ankle dorsiflexion      Ankle plantarflexion      Ankle inversion      Ankle eversion       (Blank rows = not tested)  LUMBAR SPECIAL TESTS:  Straight leg raise test: Negative and Slump test: Negative SLS - unable to stand on one leg without UE support; 10 sec with UE support   FUNCTIONAL TESTS:  5 times sit to stand: 12.98 07/20/23: 5 times sit to stand 13.13 no use of UE's and no pain  FOTO:  07/20/23: 52  GAIT: Distance walked: 40 ft Assistive device utilized: None Level of assistance: Complete Independence Comments: forward flexed posture through trunk and hips bilat; poor trunk stability with lateral shift of upper body opposite to wt shift    [facility] Adult PT Treatment:                                                DATE: 07/20/23 Therapeutic Exercise: Treadmill 1.0 mph x 5  min walking backward working on posture and alignment.    Standing  Hip flexor stretch using strap at door leaning back 30-45 sec x 3  TA activation  Wall slide 10 sec x 10  SLS 20 sec x 3 R/L  Hip extension with hold 3 sec x10 R/L  Sidelying  Assisted hip extension stretching hip flexors  Hip abduction x 2 R/L  Supine  Hip flexor stretch 30 sec x 4 R/L   Sitting  Sit to stand x 5; repeated with slow stand to sit x 10    Modalities:   Moist heat lumbar spine x 10 min    [facility] Adult PT Treatment:                                                DATE: 07/15/23 Therapeutic Exercise: Treadmill 1.0 mph x 5 min walking backward working on posture and alignment.    Standing  Hip flexor stretch using strap at door leaning back 30-45 sec x 3  TA activation  SLS 20 sec x 3 R/L  Row blue TB x 20 Palloff press blue TB x 15 R/L  Hip extension with hold 3 sec x10 R/L  Supine  Hip flexor stretch 30 sec x 2 R/L  Hamstring stretch w/ strap 30 sec x 2 R/L Sitting  Anterior/posterior pelvic tilt without and with dynadisc   Modalities:   Moist heat lumbar spine x 10 min    [facility] Adult PT Treatment:  DATE: 07/13/23 Therapeutic Exercise: Treadmill 1.0 mph x 5 min walking backward working on posture and alignment.  Manual  STM lumbar paraspinals to QL  Skilled palpation to assess response to manual work and dry needling  Trigger Point Dry-Needling  Treatment instructions: Expect mild to moderate muscle soreness. Patient verbalized understanding of these instructions and education.  Patient Consent Given: Yes Education handout provided: Previously provided Muscles treated: lumbar paraspinals; QL R  Electrical stimulation performed: Yes Parameters:  mAmp current intensity to patient tolerance  Treatment response/outcome: decreased palpable tightness; decreased tenderness  Standing  TA activation  SLS 20 sec x 3 R/L  Sitting   Anterior/posterior pelvic tilt without and with dynadisc  Hip flexor stretch 30 sec x 3 R/L  Modalities:   Moist heat lumbar spine x 10 min   PATIENT EDUCATION:  Education details: POC; HEP  Person educated: Patient Education method: Programmer, multimedia, Demonstration, Actor cues, Verbal cues, and Handouts Education comprehension: verbalized understanding, returned demonstration, verbal cues required, tactile cues required, and needs further education  HOME EXERCISE PROGRAM: Access Code: [email or web address] URL: [email or web address] Date: 07/06/2023 Prepared by: Corlis Leak  Exercises - Supine Piriformis Stretch with Leg Straight  - 2 x daily - 7 x weekly - 1 sets - 3 reps - 30 sec  hold - Hooklying Hamstring Stretch with Strap  - 2 x daily - 7 x weekly - 1 sets - 3 reps - 30 sec  hold - Hip Flexor Stretch at Edge of Bed  - 2 x daily - 7 x weekly - 1 sets - 3 reps - 30 sec  hold - Correct Standing Posture  - 2 x daily - 7 x weekly - 1 sets - 3 reps - 30 sec  hold - Seated Hip Flexor Stretch  - 2 x daily - 7 x weekly - 1 sets - 3 reps - 30 sec  hold - Standing Hip Extension with Counter Support  - 2 x daily - 7 x weekly - 1-2 sets - 10 reps - 3-5 sec  hold - Standing Hip Abduction with Unilateral Counter Support  - 2 x daily - 7 x weekly - 1 sets - 10 reps - 3-5 sec  hold - Standing Hip Flexor Stretch  - 2 x daily - 7 x weekly - 1 sets - 3-5 reps - 10 sec  hold - Standing Knee Flexion Stretch on Step  - 2 x daily - 7 x weekly - 1 sets - 5-10 reps - 1-20 sec  hold - Standing Hip Extension with Unilateral Counter Support  - 2 x daily - 7 x weekly - 1 sets - 3 reps - 30 sec  hold - Standing Shoulder Row Reactive Isometric  - 2 x daily - 7 x weekly - 1 sets - 10 reps - 30-45 sec  hold - Anti-Rotation Lateral Stepping with Press  - 2 x daily - 7 x weekly - 1-2 sets - 10 reps - 2-3 sec  hold - Supine Diaphragmatic Breathing  - 2 x daily - 7 x weekly - 1 sets - 10 reps - 4-6 sec   hold  ASSESSMENT:  CLINICAL IMPRESSION: Patient reports that back pain is increased from activities of the weekend involving more sitting and driving. Overall he reports good decrease in frequency and intensity of back pain and can achieve periods without pain at times. Continued with hip strengthening and stretches continued to progress upright postural alignment. Worked on segmental mobility in siting with and without the  dynadisc. Patient has difficulty isolating lumbar segmental movement. Progressing well toward stated goals of therapy.    EVAL: Patient is a 72 y.o. male who was seen today for physical therapy evaluation and treatment for lumbar spondylosis. Patient has a history of LBP over the past several years. He was treated by neurologist with injection and ablation with no change in symptoms. Patient presents with chronic R > L LBP; poor posture and alignment; limited trunk ROM/mobility; LE and core weakness; pain limiting functional activities and ADL's. Patient will benefit from PT to address problems identified.     OBJECTIVE IMPAIRMENTS: Abnormal gait, decreased activity tolerance, decreased balance, decreased mobility, difficulty walking, decreased ROM, decreased strength, hypomobility, increased fascial restrictions, impaired flexibility, improper body mechanics, postural dysfunction, and pain.    GOALS: Goals reviewed with patient? Yes  SHORT TERM GOALS: Target date: 07/29/2023  Independent in initial HEP Baseline: Goal status: met   2.  Improve hip extension with patient to demonstrate improve upright posture and alignment.  Baseline:  Goal status: on going    LONG TERM GOALS: Target date: 09/09/2023  Increase LE strength to 4/5 to 5/5 througout Baseline:  Goal status: on going   2.  Improve bilat hip extension to neutral  Baseline:  Goal status: on going   3.  Decrease LBP by 50-70% allowing patient to increase functional activity level  Baseline:  Goal  status: on going   4.  Decrease 5 times sit to stand to 10 seconds  Baseline: 12.98 Goal status: on going   5.  Independent in HEP, including aquatic program as indicated  Baseline:  Goal status: on going   6.  Improve functional limitation score to 53 Baseline: 40 07/20/23; 52 Goal status: on going   PLAN:  PT FREQUENCY: 2x/week  PT DURATION: 12 weeks  PLANNED INTERVENTIONS: Therapeutic exercises, Therapeutic activity, Neuromuscular re-education, Balance training, Gait training, Patient/Family education, Self Care, Joint mobilization, Stair training, Aquatic Therapy, Dry Needling, Electrical stimulation, Spinal mobilization, Cryotherapy, Moist heat, Taping, Traction, Ultrasound, Ionotophoresis 4mg /ml Dexamethasone, Manual therapy, and Re-evaluation.  PLAN FOR NEXT SESSION: review and progress HEP; continue back care and body mechanics education; manual work, DN, modalities as indicated    [employer], PT 07/20/2023, 8:46 AM

## 2023-07-22 ENCOUNTER — Encounter: Payer: Self-pay | Admitting: Rehabilitative and Restorative Service Providers"

## 2023-07-22 ENCOUNTER — Ambulatory Visit: Payer: Medicare PPO | Admitting: Rehabilitative and Restorative Service Providers"

## 2023-07-22 DIAGNOSIS — M5459 Other low back pain: Secondary | ICD-10-CM

## 2023-07-22 DIAGNOSIS — M6281 Muscle weakness (generalized): Secondary | ICD-10-CM | POA: Diagnosis not present

## 2023-07-22 DIAGNOSIS — R29898 Other symptoms and signs involving the musculoskeletal system: Secondary | ICD-10-CM

## 2023-07-22 NOTE — Therapy (Signed)
OUTPATIENT PHYSICAL THERAPY THORACOLUMBAR TREATMENT Reporting Period 06/17/23 to 07/20/23  See note below for Objective Data and Assessment of Progress/Goals.     Patient Name: John Morrow MRN: [id number] DOB:August 26, 1951, 72 y.o., male Today's Date: 07/22/2023  END OF SESSION:  PT End of Session - 07/22/23 0803     Visit Number 11    Number of Visits 24    Date for PT Re-Evaluation 09/10/23    Authorization Type [employer] copay $[id number]    Authorization Time Period 12 VISITS APPROVED FOR PT 06/17/2023-09/10/2023    Authorization - Visit Number 11    Authorization - Number of Visits 12    Progress Note Due on Visit 20    PT Start Time 0800    PT Stop Time 0845    PT Time Calculation (min) 45 min    Activity Tolerance Patient tolerated treatment well              Past Medical History:  Diagnosis Date   Allergy    Arthritis    Back pain    Diverticulosis of colon without hemorrhage 06/12/2016   On Colonoscopy 11/06/2010   HLD (hyperlipidemia) 05/29/2016   Hyperlipemia    Hypertension    Joint pain    Morbid obesity (HCC) 05/29/2016   Prediabetes    Right hip pain    needs hip replacement   Sleep apnea    on CPAP   Past Surgical History:  Procedure Laterality Date   EYE SURGERY     age 34   JOINT REPLACEMENT     Hip   KNEE SURGERY     REPLACEMENT TOTAL HIP W/  RESURFACING IMPLANTS Right 03/2019   Dr. Marcell Barlow   Patient Active Problem List   Diagnosis Date Noted   Myalgia 01/07/2023   Cognitive changes 01/07/2023   COVID-19 09/02/2022   Abnormal CT scan of head 09/02/2022   Lumbar radiculopathy 07/16/2022   Acute pancreatitis 04/07/2022   OSA (obstructive sleep apnea) 02/19/2022   Gout 12/24/2021   Chronic rhinitis 08/21/2021   Impotence of organic origin 02/13/2021   Lumbar spondylosis 10/09/2020   Pre-operative exam 07/18/2020   Verruca 05/01/2020   Epiploic appendagitis 02/20/2020   Bilateral shoulder region arthritis 04/13/2019   Vitamin D  deficiency 02/08/2019   Insulin resistance 02/08/2019   Class 2 severe obesity with serious comorbidity and body mass index (BMI) of 39.0 to 39.9 in adult (HCC) 02/08/2019   Prediabetes 02/08/2019   Chronic pain 09/28/2018   Hip pain 06/28/2018   Dependent edema 05/05/2017   Primary osteoarthritis of left knee 09/25/2016   BPH (benign prostatic hyperplasia) 05/29/2016   HTN (hypertension) 05/29/2016   HLD (hyperlipidemia) 05/29/2016   Class 2 obesity due to excess calories without serious comorbidity with body mass index (BMI) of 39.0 to 39.9 in adult 05/29/2016   Sleep apnea 03/31/2014    PCP: Dr Everrett Coombe   REFERRING PROVIDER: Clent Jacks Crist Infante, PA-C  REFERRING DIAG: Spondylosis without myelopathy or radiculopathy   Rationale for Evaluation and Treatment: Rehabilitation  THERAPY DIAG:  Other low back pain  Other symptoms and signs involving the musculoskeletal system  Muscle weakness (generalized)  ONSET DATE: 09/15/22; history of LBP ~ 6 years   SUBJECTIVE:  SUBJECTIVE STATEMENT: Patient reports that he is feeling some better today. He has recovered from driving and sitting at a soccer tournament for his granddaughter over the weekend. He feels the best with the stretch off the edge of the table which really helps. His back pain is generally decreasing in intensity. He has periods of time during the day when he has no pain.   EVAL: Patient reports that he has continued to have back pain since he was last in Physical Therapy. He has seen neurologist and received several injections including an ablation with no change in pain.   PERTINENT HISTORY:  HTN; hyperlipemia; Rt THA; obesity; back pain; prediabetes; sleep apnea  PAIN:  Are you having pain? Yes: NPRS scale: 3-4/10; best  0/10; at times 1-2/10 Pain location: low back R > L  Pain description: sharp and dull; mostly dull  Aggravating factors: sometimes with getting up from sitting or lying down but less frequent; lifting less than 10 # Relieving factors: meds; exercises  PRECAUTIONS: None   WEIGHT BEARING RESTRICTIONS: No  FALLS:  Has patient fallen in last 6 months? 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  DIAGNOSTIC FINDINGS:  MRI 07/17/23: 1. Grade 1 retrolisthesis of L1 on L2 and L2 on L3 with associated disc degeneration and bulge at L2-L3 resulting in mild spinal canal stenosis with bilateral subarticular zone narrowing and possible irritation of either traversing L3 nerve root, and moderate bilateral neural foraminal stenosis. 2. Moderate left worse than right facet arthropathy at L4-L5 without significant spinal canal or neural foraminal stenosis. 3. Disc degeneration at L5-S1 with left foraminal and right subarticular zone protrusions resulting in mild left neural foraminal stenosis with possible contact of the exiting L5 nerve root, and right subarticular zone narrowing with possible contact of the traversing right S1 nerve root.   MUSCLE LENGTH: Hamstrings: Right 45 deg; Left 50 deg Thomas test: approximately - Right -30 deg; Left -25 deg  07/15/23:  Hamstrings: Right 55 deg; Left 60 deg Thomas test: approximately - Right -20 deg; Left -20 deg  POSTURE: rounded shoulders, forward head, decreased lumbar lordosis, increased thoracic kyphosis, and extremely flexed trunk   PALPATION: Muscular tightness R > L hip flexors and R  lumbar paraspinals   LUMBAR ROM:   AROM eval  Flexion 70% pulling   Extension 30%  Right lateral flexion 65% discomfort   Left lateral flexion 60%   Right rotation 20%  Left rotation 20%   (Blank rows = not tested)  LOWER EXTREMITY ROM:   tight R hip in all planes; bilat LE's limited ROM due to patient obesity    Approximate limitations in hip extension    07/20/23: good improvement in hip extension Active  Right eval Right  07/20/23 Left eval Left  07/20/23  Hip flexion      Hip extension -30 -15 -25 -10  Hip abduction      Hip adduction      Hip internal rotation      Hip external rotation      Knee flexion      Knee extension      Ankle dorsiflexion  Ankle plantarflexion      Ankle inversion      Ankle eversion       (Blank rows = not tested)  LOWER EXTREMITY MMT:    MMT Right eval Right 07/20/23 Left eval Left 07/20/23  Hip flexion 4 5 4+ 5  Hip extension 4- 4 4- 4  Hip abduction 4 4+ 4 4+  Hip adduction      Hip internal rotation      Hip external rotation      Knee flexion 5  5   Knee extension 5  5   Ankle dorsiflexion      Ankle plantarflexion      Ankle inversion      Ankle eversion       (Blank rows = not tested)  LUMBAR SPECIAL TESTS:  Straight leg raise test: Negative and Slump test: Negative SLS - unable to stand on one leg without UE support; 10 sec with UE support   FUNCTIONAL TESTS:  5 times sit to stand: 12.98 07/20/23: 5 times sit to stand 13.13 no use of UE's and no pain  FOTO:  07/20/23: 52  GAIT: Distance walked: 40 ft Assistive device utilized: None Level of assistance: Complete Independence Comments: forward flexed posture through trunk and hips bilat; poor trunk stability with lateral shift of upper body opposite to wt shift    [facility] Adult PT Treatment:                                                DATE: 07/22/23 Therapeutic Exercise: Treadmill 1.0 to 1.2 mph x 5 min walking backward working on posture and alignment   Standing  Hip flexor stretch using strap at door leaning back 30-45 sec x 3  TA activation  Wall slide 10 sec x 10  SLS 20 sec x 3 R/L  Hip extension with hold 3 sec x10 R/L  Backward walking 20# cable x 10  Sidelying  Assisted hip extension stretching hip flexors  Hip abduction x 10 R/L (PT assist for maintaining hip extension)  Supine  Hip flexor stretch 30 sec x 4 R/L  Sitting  Sit to stand x 5; repeated with slow stand to sit x 10    Modalities:   Moist heat lumbar spine x 10 min    [facility] Adult PT Treatment:                                                DATE: 07/20/23 Therapeutic Exercise: Treadmill 1.0 mph x 5 min walking backward working on posture and alignment.    Standing  Hip flexor stretch using strap at door leaning back 30-45 sec x 3  TA activation  Wall slide 10 sec x 10  SLS 20 sec x 3 R/L  Hip extension with hold 3 sec x10 R/L  Sidelying  Assisted hip extension stretching hip flexors  Hip abduction x 2 R/L  Supine  Hip flexor stretch 30 sec x 4 R/L   Sitting  Sit to stand x 5; repeated with slow stand to sit x 10    Modalities:   Moist heat lumbar spine x 10 min    [facility] Adult PT Treatment:  DATE: 07/20/23 Therapeutic Exercise: Treadmill 1.0 mph x 5 min walking backward working on posture and alignment.    Standing  Hip flexor stretch using strap at door leaning back 30-45 sec x 3  TA activation  Wall slide 10 sec x 10  SLS 20 sec x 3 R/L  Hip extension with hold 3 sec x10 R/L  Sidelying  Assisted hip extension stretching hip flexors  Hip abduction x 2 R/L  Supine  Hip flexor stretch 30 sec x 4 R/L   Sitting  Sit to stand x 5; repeated with slow stand to sit x 10    Modalities:   Moist heat lumbar spine x 10 min    [facility] Adult PT Treatment:                                                DATE: 07/15/23 Therapeutic Exercise: Treadmill 1.0 mph x 5 min walking backward working on posture and  alignment.    Standing  Hip flexor stretch using strap at door leaning back 30-45 sec x 3  TA activation  SLS 20 sec x 3 R/L  Row blue TB x 20 Palloff press blue TB x 15 R/L  Hip extension with hold 3 sec x10 R/L  Supine  Hip flexor stretch 30 sec x 2 R/L  Hamstring stretch w/ strap 30 sec x 2 R/L Sitting  Anterior/posterior pelvic tilt without and with dynadisc   Modalities:   Moist heat lumbar spine x 10 min    [facility] Adult PT Treatment:       (dry needling)                   DATE: 07/13/23 Therapeutic Exercise: Treadmill 1.0 mph x 5 min walking backward working on posture and alignment.  Manual  STM lumbar paraspinals to QL  Skilled palpation to assess response to manual work and dry needling  Trigger Point Dry-Needling  Treatment instructions: Expect mild to moderate muscle soreness. Patient verbalized understanding of these instructions and education.  Patient Consent Given: Yes Education handout provided: Previously provided Muscles treated: lumbar paraspinals; QL R  Electrical stimulation performed: Yes Parameters:  mAmp current intensity to patient tolerance  Treatment response/outcome: decreased palpable tightness; decreased tenderness  Standing  TA activation  SLS 20 sec x 3 R/L  Sitting  Anterior/posterior pelvic tilt without and with dynadisc  Hip flexor stretch 30 sec x 3 R/L  Modalities:   Moist heat lumbar spine x 10 min   PATIENT EDUCATION:  Education details: POC; HEP  Person educated: Patient Education method: Programmer, multimedia, Demonstration, Actor cues, Verbal cues, and Handouts Education comprehension: verbalized understanding, returned demonstration, verbal cues required, tactile cues required, and needs further education  HOME EXERCISE PROGRAM: Access Code: [email or web address] URL: [email or web address] Date: 07/06/2023 Prepared by: Corlis Leak  Exercises - Supine Piriformis Stretch with Leg Straight  - 2 x daily - 7 x weekly - 1 sets - 3  reps - 30 sec  hold - Hooklying Hamstring Stretch with Strap  - 2 x daily - 7 x weekly - 1 sets - 3 reps - 30 sec  hold - Hip Flexor Stretch at Edge of Bed  - 2 x daily - 7 x weekly - 1 sets - 3 reps - 30 sec  hold - Correct Standing Posture  - 2 x daily - 7  x weekly - 1 sets - 3 reps - 30 sec  hold - Seated Hip Flexor Stretch  - 2 x daily - 7 x weekly - 1 sets - 3 reps - 30 sec  hold - Standing Hip Extension with Counter Support  - 2 x daily - 7 x weekly - 1-2 sets - 10 reps - 3-5 sec  hold - Standing Hip Abduction with Unilateral Counter Support  - 2 x daily - 7 x weekly - 1 sets - 10 reps - 3-5 sec  hold - Standing Hip Flexor Stretch  - 2 x daily - 7 x weekly - 1 sets - 3-5 reps - 10 sec  hold - Standing Knee Flexion Stretch on Step  - 2 x daily - 7 x weekly - 1 sets - 5-10 reps - 1-20 sec  hold - Standing Hip Extension with Unilateral Counter Support  - 2 x daily - 7 x weekly - 1 sets - 3 reps - 30 sec  hold - Standing Shoulder Row Reactive Isometric  - 2 x daily - 7 x weekly - 1 sets - 10 reps - 30-45 sec  hold - Anti-Rotation Lateral Stepping with Press  - 2 x daily - 7 x weekly - 1-2 sets - 10 reps - 2-3 sec  hold - Supine Diaphragmatic Breathing  - 2 x daily - 7 x weekly - 1 sets - 10 reps - 4-6 sec  hold  ASSESSMENT:  CLINICAL IMPRESSION: Patient reports that back pain is decreased today. Overall he reports good decrease in frequency and intensity of back pain and can achieve periods without pain at times. Continued with hip strengthening and stretches continued to progress upright postural alignment. Worked on segmental mobility in siting with and without the dynadisc. Patient has difficulty isolating lumbar segmental movement. Progressing well toward stated goals of therapy.    EVAL: Patient is a 72 y.o. male who was seen today for physical therapy evaluation and treatment for lumbar spondylosis. Patient has a history of LBP over the past several years. He was treated by neurologist  with injection and ablation with no change in symptoms. Patient presents with chronic R > L LBP; poor posture and alignment; limited trunk ROM/mobility; LE and core weakness; pain limiting functional activities and ADL's. Patient will benefit from PT to address problems identified.     OBJECTIVE IMPAIRMENTS: Abnormal gait, decreased activity tolerance, decreased balance, decreased mobility, difficulty walking, decreased ROM, decreased strength, hypomobility, increased fascial restrictions, impaired flexibility, improper body mechanics, postural dysfunction, and pain.    GOALS: Goals reviewed with patient? Yes  SHORT TERM GOALS: Target date: 07/29/2023  Independent in initial HEP Baseline: Goal status: met   2.  Improve hip extension with patient to demonstrate improve upright posture and alignment.  Baseline:  Goal status: on going    LONG TERM GOALS: Target date: 09/09/2023  Increase LE strength to 4/5 to 5/5 througout Baseline:  Goal status: on going   2.  Improve bilat hip extension to neutral  Baseline:  Goal status: on going   3.  Decrease LBP by 50-70% allowing patient to increase functional activity level  Baseline:  Goal status: on going   4.  Decrease 5 times sit to stand to 10 seconds  Baseline: 12.98 Goal status: on going   5.  Independent in HEP, including aquatic program as indicated  Baseline:  Goal status: on going   6.  Improve functional limitation score to 53 Baseline: 40 07/20/23; 52  Goal status: on going   PLAN:  PT FREQUENCY: 2x/week  PT DURATION: 12 weeks  PLANNED INTERVENTIONS: Therapeutic exercises, Therapeutic activity, Neuromuscular re-education, Balance training, Gait training, Patient/Family education, Self Care, Joint mobilization, Stair training, Aquatic Therapy, Dry Needling, Electrical stimulation, Spinal mobilization, Cryotherapy, Moist heat, Taping, Traction, Ultrasound, Ionotophoresis 4mg /ml Dexamethasone, Manual therapy, and  Re-evaluation.  PLAN FOR NEXT SESSION: review and progress HEP; continue back care and body mechanics education; manual work, DN, modalities as indicated    [employer], PT 07/22/2023, 8:04 AM

## 2023-07-23 ENCOUNTER — Other Ambulatory Visit: Payer: Self-pay | Admitting: Family Medicine

## 2023-07-23 DIAGNOSIS — M47816 Spondylosis without myelopathy or radiculopathy, lumbar region: Secondary | ICD-10-CM

## 2023-07-27 ENCOUNTER — Ambulatory Visit: Payer: Medicare PPO

## 2023-07-27 DIAGNOSIS — M5459 Other low back pain: Secondary | ICD-10-CM | POA: Diagnosis not present

## 2023-07-27 DIAGNOSIS — M6281 Muscle weakness (generalized): Secondary | ICD-10-CM | POA: Diagnosis not present

## 2023-07-27 DIAGNOSIS — R29898 Other symptoms and signs involving the musculoskeletal system: Secondary | ICD-10-CM | POA: Diagnosis not present

## 2023-07-27 NOTE — Therapy (Signed)
OUTPATIENT PHYSICAL THERAPY THORACOLUMBAR TREATMENT   Patient Name: John Morrow MRN: [id number] DOB:26-Mar-1951, 72 y.o., male Today's Date: 07/27/2023  END OF SESSION:  PT End of Session - 07/27/23 0847     Visit Number 12    Date for PT Re-Evaluation 09/10/23    Authorization Type [employer] copay $[id number]    Authorization Time Period 12 VISITS APPROVED FOR PT 06/17/2023-09/10/2023    Authorization - Visit Number 12    Authorization - Number of Visits 12    Progress Note Due on Visit 20    PT Start Time 0847    PT Stop Time 0937    PT Time Calculation (min) 50 min    Activity Tolerance Patient tolerated treatment well    Behavior During Therapy [facility] for tasks assessed/performed              Past Medical History:  Diagnosis Date   Allergy    Arthritis    Back pain    Diverticulosis of colon without hemorrhage 06/12/2016   On Colonoscopy 11/06/2010   HLD (hyperlipidemia) 05/29/2016   Hyperlipemia    Hypertension    Joint pain    Morbid obesity (HCC) 05/29/2016   Prediabetes    Right hip pain    needs hip replacement   Sleep apnea    on CPAP   Past Surgical History:  Procedure Laterality Date   EYE SURGERY     age 75   JOINT REPLACEMENT     Hip   KNEE SURGERY     REPLACEMENT TOTAL HIP W/  RESURFACING IMPLANTS Right 03/2019   Dr. Marcell Barlow   Patient Active Problem List   Diagnosis Date Noted   Myalgia 01/07/2023   Cognitive changes 01/07/2023   COVID-19 09/02/2022   Abnormal CT scan of head 09/02/2022   Lumbar radiculopathy 07/16/2022   Acute pancreatitis 04/07/2022   OSA (obstructive sleep apnea) 02/19/2022   Gout 12/24/2021   Chronic rhinitis 08/21/2021   Impotence of organic origin 02/13/2021   Lumbar spondylosis 10/09/2020   Pre-operative exam 07/18/2020   Verruca 05/01/2020   Epiploic appendagitis 02/20/2020   Bilateral shoulder region arthritis 04/13/2019   Vitamin D deficiency 02/08/2019   Insulin resistance 02/08/2019   Class 2 severe  obesity with serious comorbidity and body mass index (BMI) of 39.0 to 39.9 in adult (HCC) 02/08/2019   Prediabetes 02/08/2019   Chronic pain 09/28/2018   Hip pain 06/28/2018   Dependent edema 05/05/2017   Primary osteoarthritis of left knee 09/25/2016   BPH (benign prostatic hyperplasia) 05/29/2016   HTN (hypertension) 05/29/2016   HLD (hyperlipidemia) 05/29/2016   Class 2 obesity due to excess calories without serious comorbidity with body mass index (BMI) of 39.0 to 39.9 in adult 05/29/2016   Sleep apnea 03/31/2014    PCP: Dr Everrett Coombe   REFERRING PROVIDER: Clent Jacks Crist Infante, PA-C  REFERRING DIAG: Spondylosis without myelopathy or radiculopathy   Rationale for Evaluation and Treatment: Rehabilitation  THERAPY DIAG:  Other low back pain  Other symptoms and signs involving the musculoskeletal system  Muscle weakness (generalized)  ONSET DATE: 09/15/22; history of LBP ~ 6 years   SUBJECTIVE:  SUBJECTIVE STATEMENT: Patient reports 3-4/10 pain in low back, states it continues to feel sore in low back the majority of the time.  PERTINENT HISTORY:  HTN; hyperlipemia; Rt THA; obesity; back pain; prediabetes; sleep apnea  PAIN:  Are you having pain? Yes: NPRS scale: 3-4/10; best 0/10; at times 1-2/10 Pain location: low back R > L  Pain description: sharp and dull; mostly dull  Aggravating factors: sometimes with getting up from sitting or lying down but less frequent; lifting less than 10 # Relieving factors: meds; exercises  PRECAUTIONS: None   WEIGHT BEARING RESTRICTIONS: No  FALLS:  Has patient fallen in last 6 months? 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  DIAGNOSTIC FINDINGS:  MRI 07/17/23: 1. Grade 1 retrolisthesis of L1 on L2 and L2 on L3 with associated disc degeneration and bulge at L2-L3 resulting in mild spinal canal stenosis with bilateral subarticular zone narrowing and possible irritation of either traversing L3 nerve root, and moderate bilateral neural foraminal stenosis. 2. Moderate left worse than right facet arthropathy at L4-L5 without significant spinal canal or neural foraminal stenosis. 3. Disc degeneration at L5-S1 with left foraminal and right subarticular zone protrusions resulting in mild left neural foraminal stenosis with possible contact of the exiting L5 nerve root, and right subarticular zone narrowing with possible contact of the traversing right S1 nerve root.   MUSCLE LENGTH: Hamstrings: Right 45 deg; Left 50 deg Thomas test: approximately - Right -30 deg; Left -25 deg  07/15/23:  Hamstrings: Right 55 deg; Left 60 deg Thomas test: approximately - Right -20 deg; Left -20 deg  POSTURE: rounded shoulders, forward head, decreased lumbar lordosis, increased thoracic kyphosis, and extremely flexed trunk   PALPATION: Muscular tightness R > L hip flexors and R lumbar paraspinals   LUMBAR ROM:   AROM eval  Flexion 70% pulling   Extension 30%  Right lateral flexion 65% discomfort   Left lateral flexion 60%   Right rotation 20%  Left rotation 20%   (Blank rows = not tested)  LOWER EXTREMITY ROM:   tight R hip in all planes; bilat LE's limited ROM due to patient obesity    Approximate limitations in hip extension    07/20/23: good improvement in hip extension Active  Right eval Right  07/20/23 Left eval Left  07/20/23  Hip flexion      Hip extension -30  -15 -25 -10  Hip abduction      Hip adduction      Hip internal rotation      Hip external rotation      Knee flexion      Knee extension      Ankle dorsiflexion      Ankle plantarflexion      Ankle inversion      Ankle eversion       (Blank rows = not tested)  LOWER EXTREMITY MMT:    MMT Right eval Right 07/20/23 Left eval Left 07/20/23  Hip flexion 4 5 4+ 5  Hip extension 4- 4 4- 4  Hip abduction 4 4+ 4 4+  Hip adduction      Hip internal rotation  Hip external rotation      Knee flexion 5  5   Knee extension 5  5   Ankle dorsiflexion      Ankle plantarflexion      Ankle inversion      Ankle eversion       (Blank rows = not tested)  LUMBAR SPECIAL TESTS:  Straight leg raise test: Negative and Slump test: Negative SLS - unable to stand on one leg without UE support; 10 sec with UE support   FUNCTIONAL TESTS:  5 times sit to stand: 12.98 07/20/23: 5 times sit to stand 13.13 no use of UE's and no pain  FOTO:  07/20/23: 52  GAIT: Distance walked: 40 ft Assistive device utilized: None Level of assistance: Complete Independence Comments: forward flexed posture through trunk and hips bilat; poor trunk stability with lateral shift of upper body opposite to wt shift    [facility] Adult PT Treatment:                                                DATE: 07/27/2023 Therapeutic Exercise: Treadmill 1.2 mph x 5 min --> fwd walking Walking backwards 4x20' Counter: Standing spinal extension stretch Hip extension stretch --> toe on ground 4x20" S/L assisted hip flexor stretch 3x30" (B) Standing hip hinge deadlift --> seat tap against wall --> added reach across to opp side + squat Quarter wall squat x10 S/L hip abd in extension --> assist from therapist to maintain pelvic alignment Side stepping w/focus on upright standing posture  Bkwd zig zag stepping on diagonal  Modalities: Moist heat lumbar spine x 10 min     [facility] Adult PT Treatment:                                                 DATE: 07/22/23 Therapeutic Exercise: Treadmill 1.0 to 1.2 mph x 5 min walking backward working on posture and alignment  Standing  Hip flexor stretch using strap at door leaning back 30-45 sec x 3  TA activation  Wall slide 10 sec x 10  SLS 20 sec x 3 R/L  Hip extension with hold 3 sec x10 R/L  Backward walking 20# cable x 10  Sidelying  Assisted hip extension stretching hip flexors  Hip abduction x 10 R/L (PT assist for maintaining hip extension)  Supine  Hip flexor stretch 30 sec x 4 R/L  Sitting  Sit to stand x 5; repeated with slow stand to sit x 10    Modalities:   Moist heat lumbar spine x 10 min    [facility] Adult PT Treatment:       (dry needling)                   DATE: 07/13/23 Therapeutic Exercise: Treadmill 1.0 mph x 5 min walking backward working on posture and alignment.  Manual  STM lumbar paraspinals to QL  Skilled palpation to assess response to manual work and dry needling  Trigger Point Dry-Needling  Treatment instructions: Expect mild to moderate muscle soreness. Patient verbalized understanding of these instructions and education.  Patient Consent Given: Yes Education handout provided: Previously provided Muscles treated: lumbar paraspinals; QL R  Electrical stimulation performed: Yes Parameters:  mAmp current intensity to patient tolerance  Treatment response/outcome: decreased palpable tightness; decreased tenderness  Standing  TA activation  SLS 20 sec x 3 R/L  Sitting  Anterior/posterior pelvic tilt without and with dynadisc  Hip flexor stretch 30 sec x 3 R/L  Modalities:   Moist heat lumbar spine x 10 min   PATIENT EDUCATION:  Education details: POC; HEP  Person educated: Patient Education method: Programmer, multimedia, Demonstration, Actor cues, Verbal cues, and Handouts Education comprehension: verbalized understanding, returned demonstration, verbal cues required, tactile cues required, and needs further education  HOME EXERCISE  PROGRAM: Access Code: [email or web address] URL: [email or web address] Date: 07/06/2023 Prepared by: Corlis Leak  Exercises - Supine Piriformis Stretch with Leg Straight  - 2 x daily - 7 x weekly - 1 sets - 3 reps - 30 sec  hold - Hooklying Hamstring Stretch with Strap  - 2 x daily - 7 x weekly - 1 sets - 3 reps - 30 sec  hold - Hip Flexor Stretch at Edge of Bed  - 2 x daily - 7 x weekly - 1 sets - 3 reps - 30 sec  hold - Correct Standing Posture  - 2 x daily - 7 x weekly - 1 sets - 3 reps - 30 sec  hold - Seated Hip Flexor Stretch  - 2 x daily - 7 x weekly - 1 sets - 3 reps - 30 sec  hold - Standing Hip Extension with Counter Support  - 2 x daily - 7 x weekly - 1-2 sets - 10 reps - 3-5 sec  hold - Standing Hip Abduction with Unilateral Counter Support  - 2 x daily - 7 x weekly - 1 sets - 10 reps - 3-5 sec  hold - Standing Hip Flexor Stretch  - 2 x daily - 7 x weekly - 1 sets - 3-5 reps - 10 sec  hold - Standing Knee Flexion Stretch on Step  - 2 x daily - 7 x weekly - 1 sets - 5-10 reps - 1-20 sec  hold - Standing Hip Extension with Unilateral Counter Support  - 2 x daily - 7 x weekly - 1 sets - 3 reps - 30 sec  hold - Standing Shoulder Row Reactive Isometric  - 2 x daily - 7 x weekly - 1 sets - 10 reps - 30-45 sec  hold - Anti-Rotation Lateral Stepping with Press  - 2 x daily - 7 x weekly - 1-2 sets - 10 reps - 2-3 sec  hold - Supine Diaphragmatic Breathing  - 2 x daily - 7 x weekly - 1 sets - 10 reps - 4-6 sec  hold  ASSESSMENT:  CLINICAL IMPRESSION: Multi-directional stepping incorporated to challenge upright postural stability and functional hip mobility. Patient continues to demonstrate increased forward flexed hip posture and requires cueing to increase postural awareness. Patient challenged with maintaining pelvic alignment and hip extension mechanics during side lying leg raises.   EVAL: Patient is a 72 y.o. male who was seen today for physical therapy evaluation and treatment for  lumbar spondylosis. Patient has a history of LBP over the past several years. He was treated by neurologist with injection and ablation with no change in symptoms. Patient presents with chronic R > L LBP; poor posture and alignment; limited trunk ROM/mobility; LE and core weakness; pain limiting functional activities and ADL's. Patient will benefit from PT to address problems identified.     OBJECTIVE IMPAIRMENTS: Abnormal gait, decreased activity tolerance, decreased balance, decreased mobility, difficulty  walking, decreased ROM, decreased strength, hypomobility, increased fascial restrictions, impaired flexibility, improper body mechanics, postural dysfunction, and pain.    GOALS: Goals reviewed with patient? Yes  SHORT TERM GOALS: Target date: 07/29/2023  Independent in initial HEP Baseline: Goal status: MET   2.  Improve hip extension with patient to demonstrate improve upright posture and alignment.  Baseline:  Goal status: IN PROGRESS   LONG TERM GOALS: Target date: 09/09/2023  Increase LE strength to 4/5 to 5/5 througout Baseline:  Goal status: on going   2.  Improve bilat hip extension to neutral  Baseline:  Goal status: on going   3.  Decrease LBP by 50-70% allowing patient to increase functional activity level  Baseline:  Goal status: on going   4.  Decrease 5 times sit to stand to 10 seconds  Baseline: 12.98 Goal status: on going   5.  Independent in HEP, including aquatic program as indicated  Baseline:  Goal status: on going   6.  Improve functional limitation score to 53 Baseline: 40 07/20/23; 52 Goal status: on going   PLAN:  PT FREQUENCY: 2x/week  PT DURATION: 12 weeks  PLANNED INTERVENTIONS: Therapeutic exercises, Therapeutic activity, Neuromuscular re-education, Balance training, Gait training, Patient/Family education, Self Care, Joint mobilization, Stair training, Aquatic Therapy, Dry Needling, Electrical stimulation, Spinal mobilization,  Cryotherapy, Moist heat, Taping, Traction, Ultrasound, Ionotophoresis 4mg /ml Dexamethasone, Manual therapy, and Re-evaluation.  PLAN FOR NEXT SESSION: Re-eval for more auth? review and progress HEP; continue back care and body mechanics education; manual work, DN, modalities as indicated    Sanjuana Mae, PTA 07/27/2023, 9:32 AM

## 2023-07-29 ENCOUNTER — Ambulatory Visit: Payer: Medicare PPO

## 2023-07-29 DIAGNOSIS — R29898 Other symptoms and signs involving the musculoskeletal system: Secondary | ICD-10-CM

## 2023-07-29 DIAGNOSIS — M5459 Other low back pain: Secondary | ICD-10-CM

## 2023-07-29 DIAGNOSIS — M6281 Muscle weakness (generalized): Secondary | ICD-10-CM | POA: Diagnosis not present

## 2023-07-29 NOTE — Therapy (Signed)
OUTPATIENT PHYSICAL THERAPY THORACOLUMBAR TREATMENT   Patient Name: John Morrow MRN: [id number] DOB:Jun 06, 1951, 72 y.o., male Today's Date: 07/29/2023  END OF SESSION:  PT End of Session - 07/29/23 0849     Visit Number 13    Number of Visits 24    Date for PT Re-Evaluation 09/10/23    Authorization Type [employer] copay $[id number]    Authorization Time Period 12 VISITS APPROVED FOR PT 06/17/2023-09/10/2023    Progress Note Due on Visit 20    PT Start Time 0850    PT Stop Time 0942    PT Time Calculation (min) 52 min    Activity Tolerance Patient tolerated treatment well    Behavior During Therapy [facility] for tasks assessed/performed            Past Medical History:  Diagnosis Date   Allergy    Arthritis    Back pain    Diverticulosis of colon without hemorrhage 06/12/2016   On Colonoscopy 11/06/2010   HLD (hyperlipidemia) 05/29/2016   Hyperlipemia    Hypertension    Joint pain    Morbid obesity (HCC) 05/29/2016   Prediabetes    Right hip pain    needs hip replacement   Sleep apnea    on CPAP   Past Surgical History:  Procedure Laterality Date   EYE SURGERY     age 57   JOINT REPLACEMENT     Hip   KNEE SURGERY     REPLACEMENT TOTAL HIP W/  RESURFACING IMPLANTS Right 03/2019   Dr. Marcell Barlow   Patient Active Problem List   Diagnosis Date Noted   Myalgia 01/07/2023   Cognitive changes 01/07/2023   COVID-19 09/02/2022   Abnormal CT scan of head 09/02/2022   Lumbar radiculopathy 07/16/2022   Acute pancreatitis 04/07/2022   OSA (obstructive sleep apnea) 02/19/2022   Gout 12/24/2021   Chronic rhinitis 08/21/2021   Impotence of organic origin 02/13/2021   Lumbar spondylosis 10/09/2020   Pre-operative exam 07/18/2020   Verruca 05/01/2020   Epiploic appendagitis 02/20/2020   Bilateral shoulder region arthritis 04/13/2019   Vitamin D deficiency 02/08/2019   Insulin resistance 02/08/2019   Class 2 severe obesity with serious comorbidity and body mass index  (BMI) of 39.0 to 39.9 in adult (HCC) 02/08/2019   Prediabetes 02/08/2019   Chronic pain 09/28/2018   Hip pain 06/28/2018   Dependent edema 05/05/2017   Primary osteoarthritis of left knee 09/25/2016   BPH (benign prostatic hyperplasia) 05/29/2016   HTN (hypertension) 05/29/2016   HLD (hyperlipidemia) 05/29/2016   Class 2 obesity due to excess calories without serious comorbidity with body mass index (BMI) of 39.0 to 39.9 in adult 05/29/2016   Sleep apnea 03/31/2014    PCP: Dr Everrett Coombe   REFERRING PROVIDER: Clent Jacks Crist Infante, PA-C  REFERRING DIAG: Spondylosis without myelopathy or radiculopathy   Rationale for Evaluation and Treatment: Rehabilitation  THERAPY DIAG:  Other low back pain  Other symptoms and signs involving the musculoskeletal system  Muscle weakness (generalized)  ONSET DATE: 09/15/22; history of LBP ~ 6 years   SUBJECTIVE:  SUBJECTIVE STATEMENT: Patient reports he gave a platelet donation yesterday and was in a reclining chair for 3 hours and his low back has been very sore since then.   PERTINENT HISTORY:  HTN; hyperlipemia; Rt THA; obesity; back pain; prediabetes; sleep apnea  PAIN:  Are you having pain? Yes: NPRS scale: 3-4/10; best 0/10; at times 1-2/10 Pain location: low back R > L  Pain description: sharp and dull; mostly dull  Aggravating factors: sometimes with getting up from sitting or lying down but less frequent; lifting less than 10 # Relieving factors: meds; exercises  PRECAUTIONS: None   WEIGHT BEARING RESTRICTIONS: No  FALLS:  Has patient fallen in last 6 months? 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  DIAGNOSTIC FINDINGS:  MRI 07/17/23: 1. Grade 1 retrolisthesis of L1 on L2 and L2 on L3 with associated disc degeneration and bulge at L2-L3 resulting in mild spinal canal stenosis with bilateral subarticular zone narrowing and possible irritation of either traversing L3 nerve root, and moderate bilateral neural foraminal stenosis. 2. Moderate left worse than right facet arthropathy at L4-L5 without significant spinal canal or neural foraminal stenosis. 3. Disc degeneration at L5-S1 with left foraminal and right subarticular zone protrusions resulting in mild left neural foraminal stenosis with possible contact of the exiting L5 nerve root, and right subarticular zone narrowing with possible contact of the traversing right S1 nerve root.   MUSCLE LENGTH: Hamstrings: Right 45 deg; Left 50 deg Thomas test: approximately - Right -30 deg; Left -25 deg  07/15/23:  Hamstrings: Right 55 deg; Left 60 deg Thomas test: approximately - Right -20 deg; Left -20 deg  POSTURE: rounded shoulders, forward head, decreased lumbar lordosis, increased thoracic kyphosis, and extremely flexed trunk   PALPATION: Muscular tightness R > L hip flexors and R lumbar paraspinals   LUMBAR ROM:   AROM eval  Flexion 70% pulling   Extension 30%  Right lateral flexion 65% discomfort   Left lateral flexion 60%   Right rotation 20%  Left rotation 20%   (Blank rows = not tested)  LOWER EXTREMITY ROM:   tight R hip in all planes; bilat LE's limited ROM due to patient obesity    Approximate limitations in hip extension    07/20/23: good improvement in hip extension Active  Right eval Right  07/20/23 Left eval Left  07/20/23  Hip flexion      Hip extension -30 -15 -25 -10  Hip  abduction      Hip adduction      Hip internal rotation      Hip external rotation      Knee flexion      Knee extension      Ankle dorsiflexion      Ankle plantarflexion      Ankle inversion      Ankle eversion       (Blank rows = not tested)  LOWER EXTREMITY MMT:    MMT Right eval Right 07/20/23 Left eval Left 07/20/23  Hip flexion 4 5 4+ 5  Hip extension 4- 4 4- 4  Hip abduction 4 4+ 4 4+  Hip adduction  Hip internal rotation      Hip external rotation      Knee flexion 5  5   Knee extension 5  5   Ankle dorsiflexion      Ankle plantarflexion      Ankle inversion      Ankle eversion       (Blank rows = not tested)  LUMBAR SPECIAL TESTS:  Straight leg raise test: Negative and Slump test: Negative SLS - unable to stand on one leg without UE support; 10 sec with UE support   FUNCTIONAL TESTS:  5 times sit to stand: 12.98 07/20/23: 5 times sit to stand 13.13 no use of UE's and no pain  FOTO:  07/20/23: 52  GAIT: Distance walked: 40 ft Assistive device utilized: None Level of assistance: Complete Independence Comments: forward flexed posture through trunk and hips bilat; poor trunk stability with lateral shift of upper body opposite to wt shift    [facility] Adult PT Treatment:                                                DATE: 07/29/2023 Therapeutic Exercise: Passive lumbar stretch --> prone on elbows x 2 min Fwd walking --> full track x 5 laps Walking backwards 4x20' Side stepping 4x20' Side Lying: Assisted hip flexor stretch Hip extension in abduction 2x8 Straight leg hip abd x8 Small circles in abd x5 CW/CCW each  Prone knee flexion x10 (B) Supine bridges x10 Modalities: Moist heat lumbar spine x 10 min     [facility] Adult PT Treatment:                                                DATE: 07/27/2023 Therapeutic Exercise: Treadmill 1.2 mph x 5 min --> fwd walking Walking backwards 4x20' Counter: Standing spinal extension stretch Hip extension stretch  --> toe on ground 4x20" S/L assisted hip flexor stretch 3x30" (B) Standing hip hinge deadlift --> seat tap against wall --> added reach across to opp side + squat Quarter wall squat x10 S/L hip abd in extension --> assist from therapist to maintain pelvic alignment Side stepping w/focus on upright standing posture  Bkwd zig zag stepping on diagonal  Modalities: Moist heat lumbar spine x 10 min     [facility] Adult PT Treatment:       (dry needling)                   DATE: 07/13/23 Therapeutic Exercise: Treadmill 1.0 mph x 5 min walking backward working on posture and alignment.  Manual  STM lumbar paraspinals to QL  Skilled palpation to assess response to manual work and dry needling  Trigger Point Dry-Needling  Treatment instructions: Expect mild to moderate muscle soreness. Patient verbalized understanding of these instructions and education.  Patient Consent Given: Yes Education handout provided: Previously provided Muscles treated: lumbar paraspinals; QL R  Electrical stimulation performed: Yes Parameters:  mAmp current intensity to patient tolerance  Treatment response/outcome: decreased palpable tightness; decreased tenderness  Standing  TA activation  SLS 20 sec x 3 R/L  Sitting  Anterior/posterior pelvic tilt without and with dynadisc  Hip flexor stretch 30 sec x 3 R/L  Modalities:   Moist heat lumbar  spine x 10 min   PATIENT EDUCATION:  Education details: POC; HEP  Person educated: Patient Education method: Explanation, Demonstration, Tactile cues, Verbal cues, and Handouts Education comprehension: verbalized understanding, returned demonstration, verbal cues required, tactile cues required, and needs further education  HOME EXERCISE PROGRAM: Access Code: [email or web address] URL: [email or web address] Date: 07/29/2023 Prepared by: Carlynn Herald  Exercises - Supine Piriformis Stretch with Leg Straight  - 2 x daily - 7 x weekly - 1 sets - 3 reps - 30 sec  hold -  Hooklying Hamstring Stretch with Strap  - 2 x daily - 7 x weekly - 1 sets - 3 reps - 30 sec  hold - Hip Flexor Stretch at Edge of Bed  - 2 x daily - 7 x weekly - 1 sets - 3 reps - 30 sec  hold - Correct Standing Posture  - 2 x daily - 7 x weekly - 1 sets - 3 reps - 30 sec  hold - Seated Hip Flexor Stretch  - 2 x daily - 7 x weekly - 1 sets - 3 reps - 30 sec  hold - Standing Hip Extension with Counter Support  - 2 x daily - 7 x weekly - 1-2 sets - 10 reps - 3-5 sec  hold - Standing Hip Abduction with Unilateral Counter Support  - 2 x daily - 7 x weekly - 1 sets - 10 reps - 3-5 sec  hold - Standing Hip Flexor Stretch  - 2 x daily - 7 x weekly - 1 sets - 3-5 reps - 10 sec  hold - Standing Knee Flexion Stretch on Step  - 2 x daily - 7 x weekly - 1 sets - 5-10 reps - 1-20 sec  hold - Standing Hip Extension with Unilateral Counter Support  - 2 x daily - 7 x weekly - 1 sets - 3 reps - 30 sec  hold - Standing Shoulder Row Reactive Isometric  - 2 x daily - 7 x weekly - 1 sets - 10 reps - 30-45 sec  hold - Anti-Rotation Lateral Stepping with Press  - 2 x daily - 7 x weekly - 1-2 sets - 10 reps - 2-3 sec  hold - Supine Diaphragmatic Breathing  - 2 x daily - 7 x weekly - 1 sets - 10 reps - 4-6 sec  hold - Static Prone on Elbows  - 1 x daily - 7 x weekly - 3 sets - 1-3 reps - 1-2 min hold - Prone Knee Flexion  - 1 x daily - 7 x weekly - 3 sets - 10 reps - Sidelying Hip Extension in Abduction  - 1 x daily - 7 x weekly - 3 sets - 10 reps  ASSESSMENT:  CLINICAL IMPRESSION: Session focused on progression hip extension mobility and hip abduction strengthening. Tactile cues required throughout session to maintain pelvic stability and proper alignment. Patient fatigues quickly with side lying hip abd variations (L>R).   EVAL: Patient is a 71 y.o. male who was seen today for physical therapy evaluation and treatment for lumbar spondylosis. Patient has a history of LBP over the past several years. He was treated by  neurologist with injection and ablation with no change in symptoms. Patient presents with chronic R > L LBP; poor posture and alignment; limited trunk ROM/mobility; LE and core weakness; pain limiting functional activities and ADL's. Patient will benefit from PT to address problems identified.     OBJECTIVE IMPAIRMENTS: Abnormal gait, decreased activity tolerance, decreased balance, decreased  mobility, difficulty walking, decreased ROM, decreased strength, hypomobility, increased fascial restrictions, impaired flexibility, improper body mechanics, postural dysfunction, and pain.    GOALS: Goals reviewed with patient? Yes  SHORT TERM GOALS: Target date: 07/29/2023  Independent in initial HEP Baseline: Goal status: MET   2.  Improve hip extension with patient to demonstrate improve upright posture and alignment.  Baseline:  Goal status: IN PROGRESS   LONG TERM GOALS: Target date: 09/09/2023  Increase LE strength to 4/5 to 5/5 througout Baseline:  Goal status: on going   2.  Improve bilat hip extension to neutral  Baseline:  Goal status: on going   3.  Decrease LBP by 50-70% allowing patient to increase functional activity level  Baseline:  Goal status: on going   4.  Decrease 5 times sit to stand to 10 seconds  Baseline: 12.98 Goal status: on going   5.  Independent in HEP, including aquatic program as indicated  Baseline:  Goal status: on going   6.  Improve functional limitation score to 53 Baseline: 40 07/20/23; 52 Goal status: on going   PLAN:  PT FREQUENCY: 2x/week  PT DURATION: 12 weeks  PLANNED INTERVENTIONS: Therapeutic exercises, Therapeutic activity, Neuromuscular re-education, Balance training, Gait training, Patient/Family education, Self Care, Joint mobilization, Stair training, Aquatic Therapy, Dry Needling, Electrical stimulation, Spinal mobilization, Cryotherapy, Moist heat, Taping, Traction, Ultrasound, Ionotophoresis 4mg /ml Dexamethasone, Manual  therapy, and Re-evaluation.  PLAN FOR NEXT SESSION: Progress hip extension mobility, postural awareness with walking, glute/hip strengthening; manual work, DN, modalities as indicated    Sanjuana Mae, PTA 07/29/2023, 9:40 AM

## 2023-08-03 ENCOUNTER — Ambulatory Visit: Payer: Medicare PPO | Admitting: Rehabilitative and Restorative Service Providers"

## 2023-08-03 ENCOUNTER — Encounter: Payer: Self-pay | Admitting: Rehabilitative and Restorative Service Providers"

## 2023-08-03 DIAGNOSIS — M6281 Muscle weakness (generalized): Secondary | ICD-10-CM

## 2023-08-03 DIAGNOSIS — M5459 Other low back pain: Secondary | ICD-10-CM

## 2023-08-03 DIAGNOSIS — R29898 Other symptoms and signs involving the musculoskeletal system: Secondary | ICD-10-CM

## 2023-08-03 NOTE — Therapy (Signed)
OUTPATIENT PHYSICAL THERAPY THORACOLUMBAR TREATMENT   Patient Name: John Morrow MRN: [id number] DOB:10/15/1950, 72 y.o., male Today's Date: 08/03/2023  END OF SESSION:  PT End of Session - 08/03/23 0809     Visit Number 14    Number of Visits 24    Date for PT Re-Evaluation 09/10/23    Authorization Type [employer] copay $[id number]    Authorization Time Period 12 VISITS APPROVED FOR PT 06/17/2023-09/10/2023    Authorization - Visit Number 14    Authorization - Number of Visits 14    Progress Note Due on Visit 20    PT Start Time 0805    PT Stop Time 0854    PT Time Calculation (min) 49 min    Activity Tolerance Patient tolerated treatment well            Past Medical History:  Diagnosis Date   Allergy    Arthritis    Back pain    Diverticulosis of colon without hemorrhage 06/12/2016   On Colonoscopy 11/06/2010   HLD (hyperlipidemia) 05/29/2016   Hyperlipemia    Hypertension    Joint pain    Morbid obesity (HCC) 05/29/2016   Prediabetes    Right hip pain    needs hip replacement   Sleep apnea    on CPAP   Past Surgical History:  Procedure Laterality Date   EYE SURGERY     age 76   JOINT REPLACEMENT     Hip   KNEE SURGERY     REPLACEMENT TOTAL HIP W/  RESURFACING IMPLANTS Right 03/2019   Dr. Marcell Barlow   Patient Active Problem List   Diagnosis Date Noted   Myalgia 01/07/2023   Cognitive changes 01/07/2023   COVID-19 09/02/2022   Abnormal CT scan of head 09/02/2022   Lumbar radiculopathy 07/16/2022   Acute pancreatitis 04/07/2022   OSA (obstructive sleep apnea) 02/19/2022   Gout 12/24/2021   Chronic rhinitis 08/21/2021   Impotence of organic origin 02/13/2021   Lumbar spondylosis 10/09/2020   Pre-operative exam 07/18/2020   Verruca 05/01/2020   Epiploic appendagitis 02/20/2020   Bilateral shoulder region arthritis 04/13/2019   Vitamin D deficiency 02/08/2019   Insulin resistance 02/08/2019   Class 2 severe obesity with serious comorbidity and body  mass index (BMI) of 39.0 to 39.9 in adult (HCC) 02/08/2019   Prediabetes 02/08/2019   Chronic pain 09/28/2018   Hip pain 06/28/2018   Dependent edema 05/05/2017   Primary osteoarthritis of left knee 09/25/2016   BPH (benign prostatic hyperplasia) 05/29/2016   HTN (hypertension) 05/29/2016   HLD (hyperlipidemia) 05/29/2016   Class 2 obesity due to excess calories without serious comorbidity with body mass index (BMI) of 39.0 to 39.9 in adult 05/29/2016   Sleep apnea 03/31/2014    PCP: Dr Everrett Coombe   REFERRING PROVIDER: Clent Jacks Crist Infante, PA-C  REFERRING DIAG: Spondylosis without myelopathy or radiculopathy   Rationale for Evaluation and Treatment: Rehabilitation  THERAPY DIAG:  Other low back pain  Other symptoms and signs involving the musculoskeletal system  Muscle weakness (generalized)  ONSET DATE: 09/15/22; history of LBP ~ 6 years   SUBJECTIVE:  SUBJECTIVE STATEMENT: Patient reports that he is doing OK.   PERTINENT HISTORY:  HTN; hyperlipemia; Rt THA; obesity; back pain; prediabetes; sleep apnea  PAIN:  Are you having pain? Yes: NPRS scale: 2-3/10; best 0/10; at times 1-2/10 Pain location: low back R > L  Pain description: sharp and dull; mostly dull  Aggravating factors: sometimes with getting up from sitting or lying down but less frequent; lifting less than 10 # Relieving factors: meds; exercises  PRECAUTIONS: None   WEIGHT BEARING RESTRICTIONS: No  FALLS:  Has patient fallen in last 6 months? 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  DIAGNOSTIC FINDINGS:  MRI 07/17/23: 1. Grade 1 retrolisthesis of L1 on L2 and L2 on L3 with associated disc degeneration and bulge at L2-L3 resulting in mild spinal canal stenosis with bilateral subarticular zone narrowing and possible irritation of either traversing L3 nerve root, and moderate bilateral neural foraminal stenosis. 2. Moderate left worse than right facet arthropathy at L4-L5 without significant spinal canal or neural foraminal stenosis. 3. Disc degeneration at L5-S1 with left foraminal and right subarticular zone protrusions resulting in mild left neural foraminal stenosis with possible contact of the exiting L5 nerve root, and right subarticular zone narrowing with possible contact of the traversing right S1 nerve root.   MUSCLE LENGTH: Hamstrings: Right 45 deg; Left 50 deg Thomas test: approximately - Right -30 deg; Left -25 deg  07/15/23:  Hamstrings: Right 55 deg; Left 60 deg Thomas test: approximately - Right -20 deg; Left -20 deg  POSTURE: rounded shoulders, forward head, decreased lumbar lordosis, increased thoracic kyphosis, and extremely flexed trunk   PALPATION: Muscular tightness R > L hip flexors and R lumbar paraspinals   LUMBAR ROM:   AROM eval  Flexion 70% pulling   Extension 30%  Right lateral flexion 65% discomfort   Left lateral flexion 60%   Right rotation 20%  Left rotation 20%   (Blank rows = not tested)  LOWER EXTREMITY ROM:   tight R hip in all planes; bilat LE's limited ROM due to patient obesity    Approximate limitations in hip extension    07/20/23: good improvement in hip extension Active  Right eval Right  07/20/23 Left eval Left  07/20/23  Hip flexion      Hip extension -30 -15 -25 -10  Hip abduction      Hip adduction      Hip internal rotation      Hip external rotation       Knee flexion      Knee extension      Ankle dorsiflexion      Ankle plantarflexion      Ankle inversion      Ankle eversion       (Blank rows = not tested)  LOWER EXTREMITY MMT:    MMT Right eval Right 07/20/23 Left eval Left 07/20/23  Hip flexion 4 5 4+ 5  Hip extension 4- 4 4- 4  Hip abduction 4 4+ 4 4+  Hip adduction      Hip internal rotation      Hip external rotation      Knee  flexion 5  5   Knee extension 5  5   Ankle dorsiflexion      Ankle plantarflexion      Ankle inversion      Ankle eversion       (Blank rows = not tested)  LUMBAR SPECIAL TESTS:  Straight leg raise test: Negative and Slump test: Negative SLS - unable to stand on one leg without UE support; 10 sec with UE support   FUNCTIONAL TESTS:  5 times sit to stand: 12.98 07/20/23: 5 times sit to stand 13.13 no use of UE's and no pain  FOTO:  07/20/23: 52  GAIT: Distance walked: 40 ft Assistive device utilized: None Level of assistance: Complete Independence Comments: forward flexed posture through trunk and hips bilat; poor trunk stability with lateral shift of upper body opposite to wt shift   [facility] Adult PT Treatment:                                                DATE: 08/03/23 Therapeutic Exercise: Walking backwards 40 feet x 5   Standing  Hip flexor stretch standing reaching back with LE 10 sec hold x 8 R/L  Hip flexor stretch using strap at door leaning back 30-45 sec x 3  TA activation  Wall slide 10 sec x 10  SLS 20 sec x 3 R/L  Sidelying  Assisted hip extension stretching hip flexors  Hip abduction x 2 R/L  Prone  Prone partial press up x 10  Supine  Hip flexor stretch 30 sec x 3 R/L  Bridging 10sec x 10    Modalities:            Moist heat lumbar spine x 10 min    [facility] Adult PT Treatment:                                                DATE: 07/29/2023 Therapeutic Exercise: Passive lumbar stretch --> prone on elbows x 2 min Fwd walking --> full track x 5 laps Walking  backwards 4x20' Side stepping 4x20' Side Lying: Assisted hip flexor stretch Hip extension in abduction 2x8 Straight leg hip abd x8 Small circles in abd x5 CW/CCW each  Prone knee flexion x10 (B) Supine bridges x10 Modalities: Moist heat lumbar spine x 10 min     [facility] Adult PT Treatment:                                                DATE: 07/27/2023 Therapeutic Exercise: Treadmill 1.2 mph x 5 min --> fwd walking Walking backwards 4x20' Counter: Standing spinal extension stretch Hip extension stretch --> toe on ground 4x20" S/L assisted hip flexor stretch 3x30" (B) Standing hip hinge deadlift --> seat tap against wall --> added reach across to opp side + squat Quarter wall squat x10 S/L hip abd in extension --> assist from therapist to maintain pelvic alignment Side stepping w/focus on upright standing posture  Bkwd zig zag stepping on diagonal  Modalities: Moist heat lumbar spine x 10 min     [facility] Adult PT Treatment:       (  dry needling)                   DATE: 07/13/23 Therapeutic Exercise: Treadmill 1.0 mph x 5 min walking backward working on posture and alignment.  Manual  STM lumbar paraspinals to QL  Skilled palpation to assess response to manual work and dry needling  Trigger Point Dry-Needling  Treatment instructions: Expect mild to moderate muscle soreness. Patient verbalized understanding of these instructions and education.  Patient Consent Given: Yes Education handout provided: Previously provided Muscles treated: lumbar paraspinals; QL R  Electrical stimulation performed: Yes Parameters:  mAmp current intensity to patient tolerance  Treatment response/outcome: decreased palpable tightness; decreased tenderness  Standing  TA activation  SLS 20 sec x 3 R/L  Sitting  Anterior/posterior pelvic tilt without and with dynadisc  Hip flexor stretch 30 sec x 3 R/L  Modalities:   Moist heat lumbar spine x 10 min   PATIENT EDUCATION:  Education details: POC;  HEP  Person educated: Patient Education method: Programmer, multimedia, Demonstration, Actor cues, Verbal cues, and Handouts Education comprehension: verbalized understanding, returned demonstration, verbal cues required, tactile cues required, and needs further education  HOME EXERCISE PROGRAM: Access Code: [email or web address] URL: [email or web address] Date: 07/29/2023 Prepared by: Carlynn Herald  Exercises - Supine Piriformis Stretch with Leg Straight  - 2 x daily - 7 x weekly - 1 sets - 3 reps - 30 sec  hold - Hooklying Hamstring Stretch with Strap  - 2 x daily - 7 x weekly - 1 sets - 3 reps - 30 sec  hold - Hip Flexor Stretch at Edge of Bed  - 2 x daily - 7 x weekly - 1 sets - 3 reps - 30 sec  hold - Correct Standing Posture  - 2 x daily - 7 x weekly - 1 sets - 3 reps - 30 sec  hold - Seated Hip Flexor Stretch  - 2 x daily - 7 x weekly - 1 sets - 3 reps - 30 sec  hold - Standing Hip Extension with Counter Support  - 2 x daily - 7 x weekly - 1-2 sets - 10 reps - 3-5 sec  hold - Standing Hip Abduction with Unilateral Counter Support  - 2 x daily - 7 x weekly - 1 sets - 10 reps - 3-5 sec  hold - Standing Hip Flexor Stretch  - 2 x daily - 7 x weekly - 1 sets - 3-5 reps - 10 sec  hold - Standing Knee Flexion Stretch on Step  - 2 x daily - 7 x weekly - 1 sets - 5-10 reps - 1-20 sec  hold - Standing Hip Extension with Unilateral Counter Support  - 2 x daily - 7 x weekly - 1 sets - 3 reps - 30 sec  hold - Standing Shoulder Row Reactive Isometric  - 2 x daily - 7 x weekly - 1 sets - 10 reps - 30-45 sec  hold - Anti-Rotation Lateral Stepping with Press  - 2 x daily - 7 x weekly - 1-2 sets - 10 reps - 2-3 sec  hold - Supine Diaphragmatic Breathing  - 2 x daily - 7 x weekly - 1 sets - 10 reps - 4-6 sec  hold - Static Prone on Elbows  - 1 x daily - 7 x weekly - 3 sets - 1-3 reps - 1-2 min hold - Prone Knee Flexion  - 1 x daily - 7 x weekly - 3 sets - 10 reps - Sidelying  Hip Extension in Abduction  - 1 x  daily - 7 x weekly - 3 sets - 10 reps  ASSESSMENT:  CLINICAL IMPRESSION: Continued focus on progression hip extension mobility and hip abduction strengthening. Patient fatigues with hip extension and side lying hip abd variations (L>R).   EVAL: Patient is a 72 y.o. male who was seen today for physical therapy evaluation and treatment for lumbar spondylosis. Patient has a history of LBP over the past several years. He was treated by neurologist with injection and ablation with no change in symptoms. Patient presents with chronic R > L LBP; poor posture and alignment; limited trunk ROM/mobility; LE and core weakness; pain limiting functional activities and ADL's. Patient will benefit from PT to address problems identified.     OBJECTIVE IMPAIRMENTS: Abnormal gait, decreased activity tolerance, decreased balance, decreased mobility, difficulty walking, decreased ROM, decreased strength, hypomobility, increased fascial restrictions, impaired flexibility, improper body mechanics, postural dysfunction, and pain.    GOALS: Goals reviewed with patient? Yes  SHORT TERM GOALS: Target date: 07/29/2023  Independent in initial HEP Baseline: Goal status: MET   2.  Improve hip extension with patient to demonstrate improve upright posture and alignment.  Baseline:  Goal status: IN PROGRESS   LONG TERM GOALS: Target date: 09/09/2023  Increase LE strength to 4/5 to 5/5 througout Baseline:  Goal status: on going   2.  Improve bilat hip extension to neutral  Baseline:  Goal status: on going   3.  Decrease LBP by 50-70% allowing patient to increase functional activity level  Baseline:  Goal status: on going   4.  Decrease 5 times sit to stand to 10 seconds  Baseline: 12.98 Goal status: on going   5.  Independent in HEP, including aquatic program as indicated  Baseline:  Goal status: on going   6.  Improve functional limitation score to 53 Baseline: 40 07/20/23; 52 Goal status: on going    PLAN:  PT FREQUENCY: 2x/week  PT DURATION: 12 weeks  PLANNED INTERVENTIONS: Therapeutic exercises, Therapeutic activity, Neuromuscular re-education, Balance training, Gait training, Patient/Family education, Self Care, Joint mobilization, Stair training, Aquatic Therapy, Dry Needling, Electrical stimulation, Spinal mobilization, Cryotherapy, Moist heat, Taping, Traction, Ultrasound, Ionotophoresis 4mg /ml Dexamethasone, Manual therapy, and Re-evaluation.  PLAN FOR NEXT SESSION: Progress hip extension mobility, postural awareness with walking, glute/hip strengthening; manual work, DN, modalities as indicated    [employer], PT 08/03/2023, 8:12 AM

## 2023-08-04 DIAGNOSIS — L570 Actinic keratosis: Secondary | ICD-10-CM | POA: Diagnosis not present

## 2023-08-05 ENCOUNTER — Ambulatory Visit: Payer: Medicare PPO | Admitting: Rehabilitative and Restorative Service Providers"

## 2023-08-05 DIAGNOSIS — M6281 Muscle weakness (generalized): Secondary | ICD-10-CM | POA: Diagnosis not present

## 2023-08-05 DIAGNOSIS — R29898 Other symptoms and signs involving the musculoskeletal system: Secondary | ICD-10-CM

## 2023-08-05 DIAGNOSIS — M5459 Other low back pain: Secondary | ICD-10-CM

## 2023-08-05 NOTE — Therapy (Signed)
OUTPATIENT PHYSICAL THERAPY THORACOLUMBAR TREATMENT   Patient Name: John Morrow MRN: [id number] DOB:1951-08-29, 72 y.o., male Today's Date: 08/05/2023  END OF SESSION:  PT End of Session - 08/05/23 0759     Visit Number 15    Number of Visits 24    Date for PT Re-Evaluation 09/10/23    Authorization Type [employer] copay $[id number]    Authorization Time Period 12 VISITS APPROVED FOR PT 06/17/2023-09/10/2023    Authorization - Visit Number 15    Authorization - Number of Visits 24    Progress Note Due on Visit 20    PT Start Time 0800    PT Stop Time 0845    PT Time Calculation (min) 45 min    Activity Tolerance Patient tolerated treatment well    Behavior During Therapy [facility] for tasks assessed/performed            Past Medical History:  Diagnosis Date   Allergy    Arthritis    Back pain    Diverticulosis of colon without hemorrhage 06/12/2016   On Colonoscopy 11/06/2010   HLD (hyperlipidemia) 05/29/2016   Hyperlipemia    Hypertension    Joint pain    Morbid obesity (HCC) 05/29/2016   Prediabetes    Right hip pain    needs hip replacement   Sleep apnea    on CPAP   Past Surgical History:  Procedure Laterality Date   EYE SURGERY     age 89   JOINT REPLACEMENT     Hip   KNEE SURGERY     REPLACEMENT TOTAL HIP W/  RESURFACING IMPLANTS Right 03/2019   Dr. Marcell Barlow   Patient Active Problem List   Diagnosis Date Noted   Myalgia 01/07/2023   Cognitive changes 01/07/2023   COVID-19 09/02/2022   Abnormal CT scan of head 09/02/2022   Lumbar radiculopathy 07/16/2022   Acute pancreatitis 04/07/2022   OSA (obstructive sleep apnea) 02/19/2022   Gout 12/24/2021   Chronic rhinitis 08/21/2021   Impotence of organic origin 02/13/2021   Lumbar spondylosis 10/09/2020   Pre-operative exam 07/18/2020   Verruca 05/01/2020   Epiploic appendagitis 02/20/2020   Bilateral shoulder region arthritis 04/13/2019   Vitamin D deficiency 02/08/2019   Insulin resistance  02/08/2019   Class 2 severe obesity with serious comorbidity and body mass index (BMI) of 39.0 to 39.9 in adult (HCC) 02/08/2019   Prediabetes 02/08/2019   Chronic pain 09/28/2018   Hip pain 06/28/2018   Dependent edema 05/05/2017   Primary osteoarthritis of left knee 09/25/2016   BPH (benign prostatic hyperplasia) 05/29/2016   HTN (hypertension) 05/29/2016   HLD (hyperlipidemia) 05/29/2016   Class 2 obesity due to excess calories without serious comorbidity with body mass index (BMI) of 39.0 to 39.9 in adult 05/29/2016   Sleep apnea 03/31/2014    PCP: Dr Everrett Coombe   REFERRING PROVIDER: Clent Jacks Crist Infante, PA-C  REFERRING DIAG: Spondylosis without myelopathy or radiculopathy   Rationale for Evaluation and Treatment: Rehabilitation  THERAPY DIAG:  Other low back pain  Other symptoms and signs involving the musculoskeletal system  Muscle weakness (generalized)  ONSET DATE: 09/15/22; history of LBP ~ 6 years   SUBJECTIVE:  SUBJECTIVE STATEMENT: Patient reports that back is okay. He has made progress with the back pain. Pain is decreasing but not as rapidly as he would like but he is making progress. He is working on exercises at home.    PERTINENT HISTORY:  HTN; hyperlipemia; Rt THA; obesity; back pain; prediabetes; sleep apnea  PAIN:  Are you having pain? Yes: NPRS scale: 3-4/10; best 0/10; at times 1-2/10 Pain location: low back R > L  Pain description: sharp and dull; mostly dull  Aggravating factors: sometimes with getting up from sitting or lying down but less frequent; lifting less than 10 # Relieving factors: meds; exercises  PRECAUTIONS: None   WEIGHT BEARING RESTRICTIONS: No  FALLS:  Has patient fallen in last 6 months? 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  DIAGNOSTIC FINDINGS:  MRI 07/17/23: 1. Grade 1 retrolisthesis of L1 on L2 and L2 on L3 with associated disc degeneration and bulge at L2-L3 resulting in mild spinal canal stenosis with bilateral subarticular zone narrowing and possible irritation of either traversing L3 nerve root, and moderate bilateral neural foraminal stenosis. 2. Moderate left worse than right facet arthropathy at L4-L5 without significant spinal canal or neural foraminal stenosis. 3. Disc degeneration at L5-S1 with left foraminal and right subarticular zone protrusions resulting in mild left neural foraminal stenosis with possible contact of the exiting L5 nerve root, and right subarticular zone narrowing with possible contact of the traversing right S1 nerve root.   MUSCLE LENGTH: Hamstrings: Right 45 deg; Left 50 deg Thomas test: approximately - Right -30 deg; Left -25 deg  07/15/23:  Hamstrings: Right 55 deg; Left 60 deg Thomas test: approximately - Right -20 deg; Left -20 deg  POSTURE: rounded shoulders, forward head, decreased lumbar lordosis, increased thoracic kyphosis, and extremely flexed trunk   PALPATION: Muscular tightness R > L hip flexors and R lumbar paraspinals   LUMBAR ROM:   AROM eval  Flexion 70% pulling   Extension 30%  Right lateral flexion 65% discomfort   Left lateral flexion 60%   Right rotation 20%  Left rotation 20%   (Blank rows = not tested)  LOWER EXTREMITY ROM:   tight R hip in all planes; bilat LE's limited ROM due to patient obesity    Approximate limitations in hip extension    07/20/23: good improvement in hip  extension Active  Right eval Right  07/20/23 Left eval Left  07/20/23  Hip flexion      Hip extension -30 -15 -25 -10  Hip abduction      Hip adduction      Hip internal rotation      Hip external rotation      Knee flexion      Knee extension      Ankle dorsiflexion      Ankle plantarflexion      Ankle inversion      Ankle eversion       (Blank rows = not tested)  LOWER EXTREMITY MMT:    MMT Right eval Right 07/20/23 Left eval Left 07/20/23  Hip flexion 4 5 4+ 5  Hip extension 4- 4 4- 4  Hip abduction 4 4+ 4 4+  Hip adduction      Hip internal rotation      Hip external rotation      Knee flexion 5  5   Knee extension 5  5   Ankle dorsiflexion      Ankle plantarflexion      Ankle inversion      Ankle eversion       (Blank rows = not tested)  LUMBAR SPECIAL TESTS:  Straight leg raise test: Negative and Slump test: Negative SLS - unable to stand on one leg without UE support; 10 sec with UE support   FUNCTIONAL TESTS:  5 times sit to stand: 12.98 07/20/23: 5 times sit to stand 13.13 no use of UE's and no pain  FOTO:  07/20/23: 52  GAIT: Distance walked: 40 ft Assistive device utilized: None Level of assistance: Complete Independence Comments: forward flexed posture through trunk and hips bilat; poor trunk stability with lateral shift of upper body opposite to wt shift   [facility] Adult PT Treatment:                                                DATE: 08/05/23 Therapeutic Exercise: Walking backwards 40 feet x 5   Standing  Hip flexor stretch standing reaching back with LE 10 sec hold x 8 R/L  SLS 30 sec x 2 R/L  Sidelying  Assisted hip extension stretching hip flexors  Hip abduction x 2 R/L  Prone  Prone partial press up x 10  Quad stretch noodle under distal thigh 30 sec x 2 R/L  Supine  Hip flexor stretch 45-60 sec x 3 R/L  Bridging 10sec x 10    Modalities:            Moist heat lumbar spine x 10 min    [facility] Adult PT Treatment:                                                 DATE: 08/03/23 Therapeutic Exercise: Walking backwards 40 feet x 5   Standing  Hip flexor stretch standing reaching back with LE 10 sec hold x 8 R/L  Hip flexor stretch using strap at door leaning back 30-45 sec x 3  TA activation  Wall slide 10 sec x 10  SLS 20 sec x 3 R/L  Sidelying  Assisted hip extension stretching hip flexors  Hip abduction x 2 R/L  Prone  Prone partial press up x 10  Supine  Hip flexor stretch 30 sec x 3 R/L  Bridging 10sec x 10    Modalities:            Moist heat lumbar spine x 10 min    [facility] Adult PT Treatment:                                                DATE: 07/29/2023 Therapeutic Exercise: Passive lumbar stretch --> prone on elbows x 2 min Fwd walking --> full track x 5 laps Walking backwards 4x20' Side stepping 4x20' Side Lying: Assisted hip flexor  stretch Hip extension in abduction 2x8 Straight leg hip abd x8 Small circles in abd x5 CW/CCW each  Prone knee flexion x10 (B) Supine bridges x10 Modalities: Moist heat lumbar spine x 10 min    [facility] Adult PT Treatment:       (dry needling)                   DATE: 07/13/23 Therapeutic Exercise: Treadmill 1.0 mph x 5 min walking backward working on posture and alignment.  Manual  STM lumbar paraspinals to QL  Skilled palpation to assess response to manual work and dry needling  Trigger Point Dry-Needling  Treatment instructions: Expect mild to moderate muscle soreness. Patient verbalized understanding of these instructions and education.  Patient Consent Given: Yes Education handout provided: Previously provided Muscles treated: lumbar paraspinals; QL R  Electrical stimulation performed: Yes Parameters:  mAmp current intensity to patient tolerance  Treatment response/outcome: decreased palpable tightness; decreased tenderness  Standing  TA activation  SLS 20 sec x 3 R/L  Sitting  Anterior/posterior pelvic tilt without and with dynadisc  Hip flexor stretch 30  sec x 3 R/L  Modalities:   Moist heat lumbar spine x 10 min   PATIENT EDUCATION:  Education details: POC; HEP  Person educated: Patient Education method: Programmer, multimedia, Demonstration, Actor cues, Verbal cues, and Handouts Education comprehension: verbalized understanding, returned demonstration, verbal cues required, tactile cues required, and needs further education  HOME EXERCISE PROGRAM: Access Code: [email or web address] URL: [email or web address] Date: 07/29/2023 Prepared by: Carlynn Herald  Exercises - Supine Piriformis Stretch with Leg Straight  - 2 x daily - 7 x weekly - 1 sets - 3 reps - 30 sec  hold - Hooklying Hamstring Stretch with Strap  - 2 x daily - 7 x weekly - 1 sets - 3 reps - 30 sec  hold - Hip Flexor Stretch at Edge of Bed  - 2 x daily - 7 x weekly - 1 sets - 3 reps - 30 sec  hold - Correct Standing Posture  - 2 x daily - 7 x weekly - 1 sets - 3 reps - 30 sec  hold - Seated Hip Flexor Stretch  - 2 x daily - 7 x weekly - 1 sets - 3 reps - 30 sec  hold - Standing Hip Extension with Counter Support  - 2 x daily - 7 x weekly - 1-2 sets - 10 reps - 3-5 sec  hold - Standing Hip Abduction with Unilateral Counter Support  - 2 x daily - 7 x weekly - 1 sets - 10 reps - 3-5 sec  hold - Standing Hip Flexor Stretch  - 2 x daily - 7 x weekly - 1 sets - 3-5 reps - 10 sec  hold - Standing Knee Flexion Stretch on Step  - 2 x daily - 7 x weekly - 1 sets - 5-10 reps - 1-20 sec  hold - Standing Hip Extension with Unilateral Counter Support  - 2 x daily - 7 x weekly - 1 sets - 3 reps - 30 sec  hold - Standing Shoulder Row Reactive Isometric  - 2 x daily - 7 x weekly - 1 sets - 10 reps - 30-45 sec  hold - Anti-Rotation Lateral Stepping with Press  - 2 x daily - 7 x weekly - 1-2 sets - 10 reps - 2-3 sec  hold - Supine Diaphragmatic Breathing  - 2 x daily - 7 x weekly - 1 sets - 10 reps - 4-6  sec  hold - Static Prone on Elbows  - 1 x daily - 7 x weekly - 3 sets - 1-3 reps - 1-2 min hold -  Prone Knee Flexion  - 1 x daily - 7 x weekly - 3 sets - 10 reps - Sidelying Hip Extension in Abduction  - 1 x daily - 7 x weekly - 3 sets - 10 reps  ASSESSMENT:  CLINICAL IMPRESSION: Continued focus on stretching hip flexors and strengthening hip extension and hip abduction.    EVAL: Patient is a 72 y.o. male who was seen today for physical therapy evaluation and treatment for lumbar spondylosis. Patient has a history of LBP over the past several years. He was treated by neurologist with injection and ablation with no change in symptoms. Patient presents with chronic R > L LBP; poor posture and alignment; limited trunk ROM/mobility; LE and core weakness; pain limiting functional activities and ADL's. Patient will benefit from PT to address problems identified.     OBJECTIVE IMPAIRMENTS: Abnormal gait, decreased activity tolerance, decreased balance, decreased mobility, difficulty walking, decreased ROM, decreased strength, hypomobility, increased fascial restrictions, impaired flexibility, improper body mechanics, postural dysfunction, and pain.    GOALS: Goals reviewed with patient? Yes  SHORT TERM GOALS: Target date: 07/29/2023  Independent in initial HEP Baseline: Goal status: MET   2.  Improve hip extension with patient to demonstrate improve upright posture and alignment.  Baseline:  Goal status: IN PROGRESS   LONG TERM GOALS: Target date: 09/09/2023  Increase LE strength to 4/5 to 5/5 througout Baseline:  Goal status: on going   2.  Improve bilat hip extension to neutral  Baseline:  Goal status: on going   3.  Decrease LBP by 50-70% allowing patient to increase functional activity level  Baseline:  Goal status: on going   4.  Decrease 5 times sit to stand to 10 seconds  Baseline: 12.98 Goal status: on going   5.  Independent in HEP, including aquatic program as indicated  Baseline:  Goal status: on going   6.  Improve functional limitation score to 53 Baseline:  40 07/20/23; 52 Goal status: on going   PLAN:  PT FREQUENCY: 2x/week  PT DURATION: 12 weeks  PLANNED INTERVENTIONS: Therapeutic exercises, Therapeutic activity, Neuromuscular re-education, Balance training, Gait training, Patient/Family education, Self Care, Joint mobilization, Stair training, Aquatic Therapy, Dry Needling, Electrical stimulation, Spinal mobilization, Cryotherapy, Moist heat, Taping, Traction, Ultrasound, Ionotophoresis 4mg /ml Dexamethasone, Manual therapy, and Re-evaluation.  PLAN FOR NEXT SESSION: Progress hip extension mobility, postural awareness with walking, glute/hip strengthening; manual work, DN, modalities as indicated    [employer], PT 08/05/2023, 8:46 AM

## 2023-08-06 DIAGNOSIS — G4733 Obstructive sleep apnea (adult) (pediatric): Secondary | ICD-10-CM | POA: Diagnosis not present

## 2023-08-08 ENCOUNTER — Other Ambulatory Visit: Payer: Self-pay | Admitting: Family Medicine

## 2023-08-08 DIAGNOSIS — I10 Essential (primary) hypertension: Secondary | ICD-10-CM

## 2023-08-10 ENCOUNTER — Ambulatory Visit: Payer: Medicare PPO | Admitting: Rehabilitative and Restorative Service Providers"

## 2023-08-10 ENCOUNTER — Encounter: Payer: Self-pay | Admitting: Rehabilitative and Restorative Service Providers"

## 2023-08-10 DIAGNOSIS — R29898 Other symptoms and signs involving the musculoskeletal system: Secondary | ICD-10-CM | POA: Diagnosis not present

## 2023-08-10 DIAGNOSIS — M6281 Muscle weakness (generalized): Secondary | ICD-10-CM

## 2023-08-10 DIAGNOSIS — M5459 Other low back pain: Secondary | ICD-10-CM

## 2023-08-10 NOTE — Therapy (Addendum)
 OUTPATIENT PHYSICAL THERAPY THORACOLUMBAR TREATMENT PHYSICAL THERAPY DISCHARGE SUMMARY  Visits from Start of Care: 16  Current functional level related to goals / functional outcomes: See progress note for discharge status    Remaining deficits: Continued intermittent back pain    Education / Equipment: HEP    Patient agrees to discharge. Patient goals were partially met. Patient is being discharged due to being pleased with the current functional level.  Decorian Schuenemann P. Leonor Liv PT, MPH 11/26/23 4:35 PM     Patient Name: John Morrow MRN: [id number] DOB:Sep 20, 1950, 72 y.o., male Today's Date: 08/10/2023  END OF SESSION:  PT End of Session - 08/10/23 0816     Visit Number 16    Number of Visits 24    Date for PT Re-Evaluation 09/10/23    Authorization Type [employer] copay $[id number]    Authorization Time Period 12 VISITS APPROVED FOR PT 06/17/2023-09/10/2023    Authorization - Visit Number 16    Authorization - Number of Visits 24    Progress Note Due on Visit 20    PT Start Time 0810   pt late for appt   PT Stop Time 0845    PT Time Calculation (min) 35 min    Activity Tolerance Patient tolerated treatment well            Past Medical History:  Diagnosis Date   Allergy    Arthritis    Back pain    Diverticulosis of colon without hemorrhage 06/12/2016   On Colonoscopy 11/06/2010   HLD (hyperlipidemia) 05/29/2016   Hyperlipemia    Hypertension    Joint pain    Morbid obesity (HCC) 05/29/2016   Prediabetes    Right hip pain    needs hip replacement   Sleep apnea    on CPAP   Past Surgical History:  Procedure Laterality Date   EYE SURGERY     age 50   JOINT REPLACEMENT     Hip   KNEE SURGERY     REPLACEMENT TOTAL HIP W/  RESURFACING IMPLANTS Right 03/2019   Dr. Marcell Barlow   Patient Active Problem List   Diagnosis Date Noted   Myalgia 01/07/2023   Cognitive changes 01/07/2023   COVID-19 09/02/2022   Abnormal CT scan of head 09/02/2022   Lumbar  radiculopathy 07/16/2022   Acute pancreatitis 04/07/2022   OSA (obstructive sleep apnea) 02/19/2022   Gout 12/24/2021   Chronic rhinitis 08/21/2021   Impotence of organic origin 02/13/2021   Lumbar spondylosis 10/09/2020   Pre-operative exam 07/18/2020   Verruca 05/01/2020   Epiploic appendagitis 02/20/2020   Bilateral shoulder region arthritis 04/13/2019   Vitamin D deficiency 02/08/2019   Insulin resistance 02/08/2019   Class 2 severe obesity with serious comorbidity and body mass index (BMI) of 39.0 to 39.9 in adult (HCC) 02/08/2019   Prediabetes 02/08/2019   Chronic pain 09/28/2018   Hip pain 06/28/2018   Dependent edema 05/05/2017   Primary osteoarthritis of left knee 09/25/2016   BPH (benign prostatic hyperplasia) 05/29/2016   HTN (hypertension) 05/29/2016   HLD (hyperlipidemia) 05/29/2016   Class 2 obesity due to excess calories without serious comorbidity with body mass index (BMI) of 39.0 to 39.9 in adult 05/29/2016   Sleep apnea 03/31/2014    PCP: Dr Everrett Coombe   REFERRING PROVIDER: Clent Jacks Crist Infante, PA-C  REFERRING DIAG: Spondylosis without myelopathy or radiculopathy   Rationale for Evaluation and Treatment: Rehabilitation  THERAPY DIAG:  Other low back pain  Other symptoms and  signs involving the musculoskeletal system  Muscle weakness (generalized)  ONSET DATE: 09/15/22; history of LBP ~ 6 years   SUBJECTIVE:                                                                                                                                                                                           SUBJECTIVE STATEMENT: Patient reports that back is "not too bad". He has made progress with the back pain. Pain is decreasing in intensity. He is working on exercises at home.    PERTINENT HISTORY:  HTN; hyperlipemia; Rt THA; obesity; back pain; prediabetes; sleep apnea  PAIN:  Are you having pain? Yes: NPRS scale: 2-3/10; best 0/10; at times  1-2/10 Pain location: low back R > L  Pain description: sharp and dull; mostly dull  Aggravating factors: sometimes with getting up from sitting or lying down but less frequent; lifting less than 10 # Relieving factors: meds; exercises  PRECAUTIONS: None   WEIGHT BEARING RESTRICTIONS: No  FALLS:  Has patient fallen in last 6 months? 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  DIAGNOSTIC FINDINGS:  MRI 07/17/23: 1. Grade 1 retrolisthesis of L1 on L2 and L2 on L3 with associated disc degeneration and bulge at L2-L3 resulting in mild spinal canal stenosis with bilateral subarticular zone narrowing and possible irritation of either traversing L3 nerve root, and moderate bilateral neural foraminal stenosis. 2. Moderate left worse than right facet arthropathy at L4-L5 without significant spinal canal or neural foraminal stenosis. 3. Disc degeneration at L5-S1 with left foraminal and right subarticular zone protrusions resulting in mild left neural foraminal stenosis with possible contact of the exiting L5 nerve root, and right subarticular zone narrowing with possible contact of the traversing right S1 nerve root.   MUSCLE LENGTH: Hamstrings: Right 45 deg; Left 50 deg Thomas test: approximately - Right -30 deg; Left -25 deg  07/15/23:  Hamstrings: Right 55 deg; Left 60 deg Thomas test: approximately - Right -20 deg; Left -20 deg  POSTURE: rounded shoulders, forward head, decreased lumbar lordosis, increased thoracic kyphosis, and extremely flexed trunk   PALPATION: Muscular tightness R > L hip flexors and R lumbar  paraspinals   LUMBAR ROM:   AROM eval  Flexion 70% pulling   Extension 30%  Right  lateral flexion 65% discomfort   Left lateral flexion 60%   Right rotation 20%  Left rotation 20%   (Blank rows = not tested)  LOWER EXTREMITY ROM:   tight R hip in all planes; bilat LE's limited ROM due to patient obesity    Approximate limitations in hip extension    07/20/23: good improvement in hip extension Active  Right eval Right  07/20/23 Left eval Left  07/20/23  Hip flexion      Hip extension -30 -15 -25 -10  Hip abduction      Hip adduction      Hip internal rotation      Hip external rotation      Knee flexion      Knee extension      Ankle dorsiflexion      Ankle plantarflexion      Ankle inversion      Ankle eversion       (Blank rows = not tested)  LOWER EXTREMITY MMT:    MMT Right eval Right 07/20/23 Left eval Left 07/20/23  Hip flexion 4 5 4+ 5  Hip extension 4- 4 4- 4  Hip abduction 4 4+ 4 4+  Hip adduction      Hip internal rotation      Hip external rotation      Knee flexion 5  5   Knee extension 5  5   Ankle dorsiflexion      Ankle plantarflexion      Ankle inversion      Ankle eversion       (Blank rows = not tested)  LUMBAR SPECIAL TESTS:  Straight leg raise test: Negative and Slump test: Negative SLS - unable to stand on one leg without UE support; 10 sec with UE support   FUNCTIONAL TESTS:  5 times sit to stand: 12.98 07/20/23: 5 times sit to stand 13.13 no use of UE's and no pain  FOTO:  07/20/23: 52  GAIT: Distance walked: 40 ft Assistive device utilized: None Level of assistance: Complete Independence Comments: forward flexed posture through trunk and hips bilat; poor trunk stability with lateral shift of upper body opposite to wt shift   [facility] Adult PT Treatment:                                                DATE: 08/10/23 Therapeutic Exercise: Walking backwards 40 feet x 5   Standing  Side steps green TB at ankles x 8 feet x  Hip flexor  stretch standing reaching back with LE 10 sec hold x 8 R/L  SLS 30 sec x 2 R/L  Sidelying  Assisted hip extension stretching hip flexors  Hip abduction x 5 sec x 10 R/L  Prone  Prone partial press up x 10  Quad stretch noodle under distal thigh 30 sec x 2 R/L  Hip extension with some trunk rotation reaching back and over x 10 R/L  Trial of plank position on knee and forearms caused back pain Supine  Hip flexor stretch 45-60 sec x 3 R/L  Bridging 10sec x 10    Modalities:            Moist heat lumbar spine x 10 min    [facility] Adult PT Treatment:  DATE: 08/05/23 Therapeutic Exercise: Walking backwards 40 feet x 5   Standing  Hip flexor stretch standing reaching back with LE 10 sec hold x 8 R/L  SLS 30 sec x 2 R/L  Sidelying  Assisted hip extension stretching hip flexors  Hip abduction x 2 R/L  Prone  Prone partial press up x 10  Quad stretch noodle under distal thigh 30 sec x 2 R/L  Supine  Hip flexor stretch 45-60 sec x 3 R/L  Bridging 10sec x 10    Modalities:            Moist heat lumbar spine x 10 min    [facility] Adult PT Treatment:                                                DATE: 08/03/23 Therapeutic Exercise: Walking backwards 40 feet x 5   Standing  Hip flexor stretch standing reaching back with LE 10 sec hold x 8 R/L  Hip flexor stretch using strap at door leaning back 30-45 sec x 3  TA activation  Wall slide 10 sec x 10  SLS 20 sec x 3 R/L  Sidelying  Assisted hip extension stretching hip flexors  Hip abduction x 2 R/L  Prone  Prone partial press up x 10  Supine  Hip flexor stretch 30 sec x 3 R/L  Bridging 10sec x 10    Modalities:            Moist heat lumbar spine x 10 min     [facility] Adult PT Treatment:       (dry needling)                   DATE: 07/13/23 Therapeutic Exercise: Treadmill 1.0 mph x 5 min walking backward working on posture and alignment.  Manual  STM lumbar paraspinals to QL  Skilled  palpation to assess response to manual work and dry needling  Trigger Point Dry-Needling  Treatment instructions: Expect mild to moderate muscle soreness. Patient verbalized understanding of these instructions and education.  Patient Consent Given: Yes Education handout provided: Previously provided Muscles treated: lumbar paraspinals; QL R  Electrical stimulation performed: Yes Parameters:  mAmp current intensity to patient tolerance  Treatment response/outcome: decreased palpable tightness; decreased tenderness  Standing  TA activation  SLS 20 sec x 3 R/L  Sitting  Anterior/posterior pelvic tilt without and with dynadisc  Hip flexor stretch 30 sec x 3 R/L  Modalities:   Moist heat lumbar spine x 10 min   PATIENT EDUCATION:  Education details: POC; HEP  Person educated: Patient Education method: Programmer, multimedia, Demonstration, Actor cues, Verbal cues, and Handouts Education comprehension: verbalized understanding, returned demonstration, verbal cues required, tactile cues required, and needs further education  HOME EXERCISE PROGRAM: Access Code: [email or web address] URL: [email or web address] Date: 07/29/2023 Prepared by: Carlynn Herald  Exercises - Supine Piriformis Stretch with Leg Straight  - 2 x daily - 7 x weekly - 1 sets - 3 reps - 30 sec  hold - Hooklying Hamstring Stretch with Strap  - 2 x daily - 7 x weekly - 1 sets - 3 reps - 30 sec  hold - Hip Flexor Stretch at Edge of Bed  - 2 x daily - 7 x weekly - 1 sets - 3 reps - 30 sec  hold - Correct Standing Posture  - 2  x daily - 7 x weekly - 1 sets - 3 reps - 30 sec  hold - Seated Hip Flexor Stretch  - 2 x daily - 7 x weekly - 1 sets - 3 reps - 30 sec  hold - Standing Hip Extension with Counter Support  - 2 x daily - 7 x weekly - 1-2 sets - 10 reps - 3-5 sec  hold - Standing Hip Abduction with Unilateral Counter Support  - 2 x daily - 7 x weekly - 1 sets - 10 reps - 3-5 sec  hold - Standing Hip Flexor Stretch  - 2 x daily -  7 x weekly - 1 sets - 3-5 reps - 10 sec  hold - Standing Knee Flexion Stretch on Step  - 2 x daily - 7 x weekly - 1 sets - 5-10 reps - 1-20 sec  hold - Standing Hip Extension with Unilateral Counter Support  - 2 x daily - 7 x weekly - 1 sets - 3 reps - 30 sec  hold - Standing Shoulder Row Reactive Isometric  - 2 x daily - 7 x weekly - 1 sets - 10 reps - 30-45 sec  hold - Anti-Rotation Lateral Stepping with Press  - 2 x daily - 7 x weekly - 1-2 sets - 10 reps - 2-3 sec  hold - Supine Diaphragmatic Breathing  - 2 x daily - 7 x weekly - 1 sets - 10 reps - 4-6 sec  hold - Static Prone on Elbows  - 1 x daily - 7 x weekly - 3 sets - 1-3 reps - 1-2 min hold - Prone Knee Flexion  - 1 x daily - 7 x weekly - 3 sets - 10 reps - Sidelying Hip Extension in Abduction  - 1 x daily - 7 x weekly - 3 sets - 10 reps  ASSESSMENT:  CLINICAL IMPRESSION: Continued stiffness and tightness through hip flexors. Treatment focus on stretching hip flexors and strengthening hip extension and hip abduction.    EVAL: Patient is a 72 y.o. male who was seen today for physical therapy evaluation and treatment for lumbar spondylosis. Patient has a history of LBP over the past several years. He was treated by neurologist with injection and ablation with no change in symptoms. Patient presents with chronic R > L LBP; poor posture and alignment; limited trunk ROM/mobility; LE and core weakness; pain limiting functional activities and ADL's. Patient will benefit from PT to address problems identified.     OBJECTIVE IMPAIRMENTS: Abnormal gait, decreased activity tolerance, decreased balance, decreased mobility, difficulty walking, decreased ROM, decreased strength, hypomobility, increased fascial restrictions, impaired flexibility, improper body mechanics, postural dysfunction, and pain.    GOALS: Goals reviewed with patient? Yes  SHORT TERM GOALS: Target date: 07/29/2023  Independent in initial HEP Baseline: Goal status: MET    2.  Improve hip extension with patient to demonstrate improve upright posture and alignment.  Baseline:  Goal status: IN PROGRESS   LONG TERM GOALS: Target date: 09/09/2023  Increase LE strength to 4/5 to 5/5 througout Baseline:  Goal status: on going   2.  Improve bilat hip extension to neutral  Baseline:  Goal status: on going   3.  Decrease LBP by 50-70% allowing patient to increase functional activity level  Baseline:  Goal status: on going   4.  Decrease 5 times sit to stand to 10 seconds  Baseline: 12.98 Goal status: on going   5.  Independent in HEP, including aquatic program  as indicated  Baseline:  Goal status: on going   6.  Improve functional limitation score to 53 Baseline: 40 07/20/23; 52 Goal status: on going   PLAN:  PT FREQUENCY: 2x/week  PT DURATION: 12 weeks  PLANNED INTERVENTIONS: Therapeutic exercises, Therapeutic activity, Neuromuscular re-education, Balance training, Gait training, Patient/Family education, Self Care, Joint mobilization, Stair training, Aquatic Therapy, Dry Needling, Electrical stimulation, Spinal mobilization, Cryotherapy, Moist heat, Taping, Traction, Ultrasound, Ionotophoresis 4mg /ml Dexamethasone, Manual therapy, and Re-evaluation.  PLAN FOR NEXT SESSION: Progress hip extension mobility, postural awareness with walking, glute/hip strengthening; manual work, DN, modalities as indicated    [employer], PT 08/10/2023, 8:18 AM

## 2023-08-17 ENCOUNTER — Other Ambulatory Visit: Payer: Self-pay | Admitting: Family Medicine

## 2023-08-17 DIAGNOSIS — M47816 Spondylosis without myelopathy or radiculopathy, lumbar region: Secondary | ICD-10-CM

## 2023-09-02 DIAGNOSIS — F1190 Opioid use, unspecified, uncomplicated: Secondary | ICD-10-CM | POA: Diagnosis not present

## 2023-09-02 DIAGNOSIS — M47816 Spondylosis without myelopathy or radiculopathy, lumbar region: Secondary | ICD-10-CM | POA: Diagnosis not present

## 2023-09-05 DIAGNOSIS — G4733 Obstructive sleep apnea (adult) (pediatric): Secondary | ICD-10-CM | POA: Diagnosis not present

## 2023-09-14 ENCOUNTER — Other Ambulatory Visit: Payer: Self-pay | Admitting: Family Medicine

## 2023-09-14 DIAGNOSIS — M47816 Spondylosis without myelopathy or radiculopathy, lumbar region: Secondary | ICD-10-CM

## 2023-10-05 DIAGNOSIS — G4733 Obstructive sleep apnea (adult) (pediatric): Secondary | ICD-10-CM | POA: Diagnosis not present

## 2023-10-06 ENCOUNTER — Ambulatory Visit (INDEPENDENT_AMBULATORY_CARE_PROVIDER_SITE_OTHER): Payer: Medicare PPO | Admitting: Family Medicine

## 2023-10-06 ENCOUNTER — Encounter: Payer: Self-pay | Admitting: Family Medicine

## 2023-10-06 VITALS — Ht 71.0 in | Wt 290.0 lb

## 2023-10-06 DIAGNOSIS — Z0000 Encounter for general adult medical examination without abnormal findings: Secondary | ICD-10-CM

## 2023-10-06 NOTE — Progress Notes (Signed)
Subjective:   John Morrow is a 73 y.o. male who presents for Medicare Annual/Subsequent preventive examination.  Visit Complete: Virtual I connected with  [name] on 10/06/23 by a audio enabled telemedicine application and verified that I am speaking with the correct person using two identifiers.  Patient Location: Other:  car   Provider Location: Office/Clinic  I discussed the limitations of evaluation and management by telemedicine. The patient expressed understanding and agreed to proceed.  Vital Signs: Because this visit was a virtual/telehealth visit, some criteria may be missing or patient reported. Any vitals not documented were not able to be obtained and vitals that have been documented are patient reported.  Patient Medicare AWV questionnaire was completed by the patient on 10/02/23; I have confirmed that all information answered by patient is correct and no changes since this date.  Cardiac Risk Factors include: advanced age (>7men, >26 women);male gender;obesity (BMI >30kg/m2);hypertension     Objective:    Today's Vitals   10/06/23 0816  Weight: 290 lb (131.5 kg)  Height: 5\' 11"  (1.803 m)  PainSc: 3    Body mass index is 40.45 kg/m.     10/06/2023    8:28 AM 06/17/2023   11:53 AM 10/02/2022    8:17 AM 04/15/2022    8:03 AM 03/29/2022   12:04 PM 09/23/2021    8:17 AM 02/13/2020   10:10 AM  Advanced Directives  Does Patient Have a Medical Advance Directive? Yes Yes Yes No No Yes Yes  Type of Estate agent of [place];Living will Healthcare Power of [place];Living will Living will   Living will;Healthcare Power of Attorney   Does patient want to make changes to medical advance directive? No - Patient declined  No - Patient declined   No - Patient declined   Copy of Healthcare Power of Attorney in Chart?  No - copy requested    No - copy requested   Would patient like information on creating a medical advance directive?    No - Patient  declined       Current Medications (verified) Outpatient Encounter Medications as of 10/06/2023  Medication Sig   HYDROcodone-acetaminophen (NORCO/VICODIN) 5-325 MG tablet Take 1 tablet by mouth daily as needed.   lisinopril-hydrochlorothiazide (ZESTORETIC) 20-25 MG tablet Take 1 tablet by mouth once daily   meloxicam (MOBIC) 15 MG tablet TAKE 1 TABLET BY MOUTH ONCE DAILY AS NEEDED FOR PAIN   Misc. Devices MISC Start auto cpap 5-15 cm. Water with mask and supplies   rosuvastatin (CRESTOR) 10 MG tablet Take 1 tablet (10 mg total) by mouth daily.   tamsulosin (FLOMAX) 0.4 MG CAPS capsule Take 1 capsule by mouth once daily   omeprazole (PRILOSEC) 20 MG capsule Take 1 capsule (20 mg total) by mouth daily. (Patient not taking: Reported on 10/06/2023)   No facility-administered encounter medications on file as of 10/06/2023.    Allergies (verified) Morphine and codeine   History: Past Medical History:  Diagnosis Date   Allergy    Arthritis    Back pain    Diverticulosis of colon without hemorrhage 06/12/2016   On Colonoscopy 11/06/2010   HLD (hyperlipidemia) 05/29/2016   Hyperlipemia    Hypertension    Joint pain    Morbid obesity (HCC) 05/29/2016   Prediabetes    Right hip pain    needs hip replacement   Sleep apnea    on CPAP   Past Surgical History:  Procedure Laterality Date   EYE SURGERY  age 23   JOINT REPLACEMENT     Hip   KNEE SURGERY     REPLACEMENT TOTAL HIP W/  RESURFACING IMPLANTS Right 03/2019   Dr. Marcell Barlow   Family History  Problem Relation Age of Onset   Lung disease Mother    Hypertension Mother    Lung disease Father    Alcoholism Father    Colon cancer Neg Hx    Colon polyps Neg Hx    Esophageal cancer Neg Hx    Stomach cancer Neg Hx    Rectal cancer Neg Hx    Social History   Socioeconomic History   Marital status: Married    Spouse name: Chip Boer   Number of children: 4   Years of education: 21   Highest education level: Biochemist, clinical History   Occupation: retired    Comment: Runner, broadcasting/film/video  Tobacco Use   Smoking status: Never   Smokeless tobacco: Never  Vaping Use   Vaping status: Never Used  Substance and Sexual Activity   Alcohol use: Not Currently    Comment: rarely   Drug use: No   Sexual activity: Not Currently    Partners: Female  Other Topics Concern   Not on file  Social History Narrative   Lives with his wife. 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   Social Drivers of Corporate investment banker Strain: Low Risk  (10/06/2023)   Overall Financial Resource Strain (CARDIA)    Difficulty of Paying Living Expenses: Not hard at all  Food Insecurity: No Food Insecurity (10/06/2023)   Hunger Vital Sign    Worried About Running Out of Food in the Last Year: Never true    Ran Out of Food in the Last Year: Never true  Transportation Needs: No Transportation Needs (10/06/2023)   PRAPARE - Administrator, Civil Service (Medical): No    Lack of Transportation (Non-Medical): No  Physical Activity: Inactive (10/06/2023)   Exercise Vital Sign    Days of Exercise per Week: 0 days    Minutes of Exercise per Session: 0 min  Stress: No Stress Concern Present (10/06/2023)   [employer] of Occupational Health - Occupational Stress Questionnaire    Feeling of Stress : Only a little  Social Connections: Socially Integrated (10/06/2023)   Social Connection and Isolation Panel [NHANES]    Frequency of Communication with Friends and Family: Three times a week    Frequency of Social Gatherings with Friends and Family: Twice a week    Attends Religious Services: More than 4 times per year    Active Member of [employer] or Organizations: Yes    Attends Engineer, structural: More than 4 times per year    Marital Status: Married    Tobacco Counseling Counseling given: Not Answered   Clinical Intake:  Pre-visit preparation completed:  Yes  Pain : 0-10 Pain Score: 3  Pain Type: Chronic pain Pain Location: Back Pain Orientation: Lower Pain Descriptors / Indicators: Contraction Pain Onset: More than a month ago Pain Frequency: Constant (sees pain doctor) Pain Relieving Factors: pain medication once per day. Effect of Pain on Daily Activities: moves more  than usual, hurts with steps.  Pain Relieving Factors: pain medication once per day.  BMI - recorded: 40.4 Nutritional Status: BMI > 30  Obese Nutritional Risks: None Diabetes: No  How often do you need to have someone help you when you read instructions, pamphlets, or other  written materials from your doctor or pharmacy?: 1 - Never What is the last grade level you completed in school?: 21  Interpreter Needed?: No      Activities of Daily Living    10/06/2023    8:21 AM 10/02/2023    8:56 AM  In your present state of health, do you have any difficulty performing the following activities:  Hearing? 0 0  Vision? 0 0  Difficulty concentrating or making decisions? 0 0  Walking or climbing stairs? 1 1  Comment due to back issues   Dressing or bathing? 0 0  Doing errands, shopping? 0 0  Preparing Food and eating ? N N  Using the Toilet? N N  In the past six months, have you accidently leaked urine? N N  Do you have problems with loss of bowel control? N N  Managing your Medications? N N  Managing your Finances?  N  Housekeeping or managing your Housekeeping? N N    Patient Care Team: Everrett Coombe, DO as PCP - General (Family Medicine) Celedonio Miyamoto, PA Pain Management  Ellan Lambert, PA Dermatology Randolm Idol, MD Sleep Medicine/Pulmonary       Indicate any recent Medical Services you may have received from other than [facility] providers in the past year (date may be approximate).     Assessment:   This is a routine wellness examination for Carrie.  Hearing/Vision screen Hearing Screening - Comments:: Unable to test, grossly  intact. Vision Screening - Comments:: Unable to test, wears glasses. No issues reported.    Goals Addressed             This Visit's Progress    Exercise 3x per week (30 min per time)        Depression Screen    10/06/2023    8:27 AM 10/02/2022    8:17 AM 04/07/2022   11:26 AM 02/19/2022    8:43 AM 12/24/2021    2:31 PM 09/23/2021    8:17 AM 02/19/2021    8:09 AM  PHQ 2/9 Scores  PHQ - 2 Score 0 0 2 2 0 0 0  PHQ- 9 Score   6 2       Fall Risk    10/06/2023    8:30 AM 10/02/2023    8:56 AM 10/02/2022    8:17 AM 09/28/2022    8:47 AM 12/24/2021    2:31 PM  Fall Risk   Falls in the past year? 0 0 0 0 1  Number falls in past yr: 0 0 0 0 0  Injury with Fall? 0  0 0 0  Risk for fall due to : No Fall Risks  Impaired mobility  No Fall Risks  Follow up   Falls evaluation completed  Falls evaluation completed    MEDICARE RISK AT HOME: Medicare Risk at Home Any stairs in or around the home?: No If so, are there any without handrails?: No Home free of loose throw rugs in walkways, pet beds, electrical cords, etc?: No Adequate lighting in your home to reduce risk of falls?: Yes Life alert?: No Use of a cane, walker or w/c?: No Grab bars in the bathroom?: No Shower chair or bench in shower?: No Elevated toilet seat or a handicapped toilet?: No  TIMED UP AND GO:  Was the test performed?  No    Cognitive Function:        10/06/2023    8:31 AM 10/02/2022    8:19 AM 09/23/2021  8:22 AM 09/07/2019    9:10 AM 09/06/2018    9:16 AM  6CIT Screen  What Year? 0 points 0 points 0 points 0 points 0 points  What month? 0 points 0 points 0 points 0 points 0 points  What time? 0 points 0 points 0 points 0 points 0 points  Count back from 20 0 points 0 points 0 points 0 points 0 points  Months in reverse 0 points 0 points 0 points 0 points 0 points  Repeat phrase 0 points 0 points 0 points 0 points 0 points  Total Score 0 points 0 points 0 points 0 points 0 points     Immunizations Immunization History  Administered Date(s) Administered   Fluad Quad(high Dose 65+) 07/03/2021, 05/29/2022   Fluad Trivalent(High Dose 65+) 05/19/2023   Influenza, High Dose Seasonal PF 05/05/2017, 05/18/2019, 06/15/2020   Influenza,inj,Quad PF,6+ Mos 05/29/2016, 05/27/2018   Influenza-Unspecified 06/20/2020   PFIZER(Purple Top)SARS-COV-2 Vaccination 12/26/2019, 01/16/2020   Pneumococcal Conjugate-13 06/10/2016   Pneumococcal Polysaccharide-23 06/17/2017   Tdap 05/29/2016   Zoster Recombinant(Shingrix) 11/29/2016, 01/30/2017, 06/28/2020   Zoster, Live 01/17/2012    TDAP status: Up to date  Flu Vaccine status: Up to date  Pneumococcal vaccine status: Up to date  Covid-19 vaccine status: Completed vaccines at local pharmacy, per patient report  Qualifies for Shingles Vaccine? Yes   Zostavax completed Yes   Shingrix Completed?: Yes  Screening Tests Health Maintenance  Topic Date Due   COVID-19 Vaccine (3 - 2024-25 season) 05/17/2023   Colonoscopy  06/18/2023   Medicare Annual Wellness (AWV)  10/05/2024   DTaP/Tdap/Td (2 - Td or Tdap) 05/29/2026   Pneumonia Vaccine 38+ Years old  Completed   INFLUENZA VACCINE  Completed   Hepatitis C Screening  Completed   Zoster Vaccines- Shingrix  Completed   HPV VACCINES  Aged Out    Health Maintenance  Health Maintenance Due  Topic Date Due   COVID-19 Vaccine (3 - 2024-25 season) 05/17/2023   Colonoscopy  06/18/2023    Colorectal cancer screening: Type of screening: Colonoscopy. Completed 06/17/2022. Repeat every n/a due to age.   Lung Cancer Screening: (Low Dose CT Chest recommended if Age 53-80 years, 20 pack-year currently smoking OR have quit w/in 15years.) does not qualify.   Lung Cancer Screening Referral: n/a   Additional Screening:  Hepatitis C Screening: does qualify; Completed 06/05/2016  Vision Screening: Recommended annual ophthalmology exams for early detection of glaucoma and other disorders  of the eye. Is the patient up to date with their annual eye exam?  Yes  Who is the provider or what is the name of the office in which the patient attends annual eye exams? [address] [place], [place]  If pt is not established with a provider, would they like to be referred to a provider to establish care? No .   Dental Screening: Recommended annual dental exams for proper oral hygiene  Diabetic Foot Exam: n/a   Community Resource Referral / Chronic Care Management: CRR required this visit?  No   CCM required this visit?  No     Plan:     I have personally reviewed and noted the following in the patient's chart:   Medical and social history Use of alcohol, tobacco or illicit drugs  Current medications and supplements including opioid prescriptions. Patient is currently taking opioid prescriptions. Information provided to patient regarding non-opioid alternatives. Patient advised to discuss non-opioid treatment plan with their provider. Functional ability and status Nutritional status Physical activity  Advanced directives List of other physicians Hospitalizations, surgeries, and ER visits in previous 12 months: none  Vitals: unable to obtain, virtual  Screenings to include cognitive, depression, and falls Referrals and appointments: none   In addition, I have reviewed and discussed with patient certain preventive protocols, quality metrics, and best practice recommendations. A written personalized care plan for preventive services as well as general preventive health recommendations were provided to patient.     Novella Olive, FNP   10/06/2023   After Visit Summary: (MyChart) Due to this being a telephonic visit, the after visit summary with patients personalized plan was offered to patient via MyChart   Follow-up with PCP as scheduled.

## 2023-10-15 DIAGNOSIS — Z96641 Presence of right artificial hip joint: Secondary | ICD-10-CM | POA: Diagnosis not present

## 2023-10-15 DIAGNOSIS — M25862 Other specified joint disorders, left knee: Secondary | ICD-10-CM | POA: Diagnosis not present

## 2023-10-15 DIAGNOSIS — M25461 Effusion, right knee: Secondary | ICD-10-CM | POA: Diagnosis not present

## 2023-10-15 DIAGNOSIS — M25861 Other specified joint disorders, right knee: Secondary | ICD-10-CM | POA: Diagnosis not present

## 2023-10-15 DIAGNOSIS — Z471 Aftercare following joint replacement surgery: Secondary | ICD-10-CM | POA: Diagnosis not present

## 2023-10-15 DIAGNOSIS — M1612 Unilateral primary osteoarthritis, left hip: Secondary | ICD-10-CM | POA: Diagnosis not present

## 2023-10-15 DIAGNOSIS — M25561 Pain in right knee: Secondary | ICD-10-CM | POA: Diagnosis not present

## 2023-10-15 DIAGNOSIS — M47816 Spondylosis without myelopathy or radiculopathy, lumbar region: Secondary | ICD-10-CM | POA: Diagnosis not present

## 2023-10-15 DIAGNOSIS — M25562 Pain in left knee: Secondary | ICD-10-CM | POA: Diagnosis not present

## 2023-10-15 DIAGNOSIS — M170 Bilateral primary osteoarthritis of knee: Secondary | ICD-10-CM | POA: Diagnosis not present

## 2023-11-09 DIAGNOSIS — G4733 Obstructive sleep apnea (adult) (pediatric): Secondary | ICD-10-CM | POA: Diagnosis not present

## 2023-11-11 ENCOUNTER — Other Ambulatory Visit: Payer: Self-pay

## 2023-11-11 DIAGNOSIS — I10 Essential (primary) hypertension: Secondary | ICD-10-CM

## 2023-11-11 MED ORDER — LISINOPRIL-HYDROCHLOROTHIAZIDE 20-25 MG PO TABS: 1.0000 | ORAL_TABLET | Freq: Every day | ORAL | 0 refills | Status: DC

## 2023-11-16 ENCOUNTER — Encounter: Payer: Self-pay | Admitting: Family Medicine

## 2023-11-16 ENCOUNTER — Ambulatory Visit: Payer: Medicare PPO | Admitting: Family Medicine

## 2023-11-16 VITALS — BP 138/82 | HR 71 | Ht 71.0 in | Wt 299.0 lb

## 2023-11-16 DIAGNOSIS — M47816 Spondylosis without myelopathy or radiculopathy, lumbar region: Secondary | ICD-10-CM | POA: Diagnosis not present

## 2023-11-16 DIAGNOSIS — R4189 Other symptoms and signs involving cognitive functions and awareness: Secondary | ICD-10-CM

## 2023-11-16 DIAGNOSIS — E66813 Obesity, class 3: Secondary | ICD-10-CM | POA: Diagnosis not present

## 2023-11-16 DIAGNOSIS — R7303 Prediabetes: Secondary | ICD-10-CM | POA: Diagnosis not present

## 2023-11-16 DIAGNOSIS — I10 Essential (primary) hypertension: Secondary | ICD-10-CM | POA: Diagnosis not present

## 2023-11-16 DIAGNOSIS — N400 Enlarged prostate without lower urinary tract symptoms: Secondary | ICD-10-CM | POA: Diagnosis not present

## 2023-11-16 DIAGNOSIS — E559 Vitamin D deficiency, unspecified: Secondary | ICD-10-CM

## 2023-11-16 DIAGNOSIS — E782 Mixed hyperlipidemia: Secondary | ICD-10-CM | POA: Diagnosis not present

## 2023-11-16 DIAGNOSIS — G4733 Obstructive sleep apnea (adult) (pediatric): Secondary | ICD-10-CM | POA: Diagnosis not present

## 2023-11-16 DIAGNOSIS — Z6841 Body Mass Index (BMI) 40.0 and over, adult: Secondary | ICD-10-CM | POA: Diagnosis not present

## 2023-11-16 NOTE — Assessment & Plan Note (Signed)
Updating lipid panel today. 

## 2023-11-16 NOTE — Assessment & Plan Note (Signed)
 He is seeing spine specialist and physiatry at this point.  He did not really have any significant improvement with the injections.  Stable with current pain medications.

## 2023-11-16 NOTE — Progress Notes (Signed)
 John Morrow - 73 y.o. male MRN [id number]  Date of birth: 02/23/1951  Subjective Chief Complaint  Patient presents with   Medical Management of Chronic Issues    HPI John Morrow is a 73 y.o. male here today for follow up visit.   He reports that he is doing pretty well.  He is seeing orthopedics for knee pain.  Back pain is still present but stable.  He is seeing spine specialist and physiatrist..  BP remains well controlled with lisinopril/hydrochlorothiazide.  He has not had side effects from medication.  He denies chest pain, shortness of breath, palpitations, headache or vision changes.   Tolerating crestor well for HLD.    BPH symptoms stable with flomax.   ROS:  A comprehensive ROS was completed and negative except as noted per HPI  Allergies  Allergen Reactions   Morphine And Codeine     Rash, vomiting, itching     Past Medical History:  Diagnosis Date   Allergy    Arthritis    Back pain    Diverticulosis of colon without hemorrhage 06/12/2016   On Colonoscopy 11/06/2010   HLD (hyperlipidemia) 05/29/2016   Hyperlipemia    Hypertension    Joint pain    Morbid obesity (HCC) 05/29/2016   Prediabetes    Right hip pain    needs hip replacement   Sleep apnea    on CPAP    Past Surgical History:  Procedure Laterality Date   EYE SURGERY     age 55   JOINT REPLACEMENT     Hip   KNEE SURGERY     REPLACEMENT TOTAL HIP W/  RESURFACING IMPLANTS Right 03/2019   Dr. Marcell Barlow    Social History   Socioeconomic History   Marital status: Married    Spouse name: Chip Boer   Number of children: 4   Years of education: 21   Highest education level: Patent examiner History   Occupation: retired    Comment: Runner, broadcasting/film/video  Tobacco Use   Smoking status: Never   Smokeless tobacco: Never  Vaping Use   Vaping status: Never Used  Substance and Sexual Activity   Alcohol use: Not Currently    Comment: rarely   Drug use: No   Sexual activity: Not Currently     Partners: Female  Other Topics Concern   Not on file  Social History Narrative   Lives with his wife. 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   Social Drivers of Corporate investment banker Strain: Low Risk  (11/12/2023)   Overall Financial Resource Strain (CARDIA)    Difficulty of Paying Living Expenses: Not hard at all  Food Insecurity: No Food Insecurity (11/12/2023)   Hunger Vital Sign    Worried About Running Out of Food in the Last Year: Never true    Ran Out of Food in the Last Year: Never true  Transportation Needs: No Transportation Needs (11/12/2023)   PRAPARE - Administrator, Civil Service (Medical): No    Lack of Transportation (Non-Medical): No  Physical Activity: Insufficiently Active (11/12/2023)   Exercise Vital Sign    Days of Exercise per Week: 1 day    Minutes of Exercise per Session: 30 min  Stress: No Stress Concern Present (11/12/2023)   [employer] of Occupational Health - Occupational Stress Questionnaire    Feeling of Stress : Not at all  Social Connections: Socially Integrated (11/12/2023)   Social Connection and Isolation  Panel [NHANES]    Frequency of Communication with Friends and Family: Twice a week    Frequency of Social Gatherings with Friends and Family: Twice a week    Attends Religious Services: More than 4 times per year    Active Member of Clubs or Organizations: Yes    Attends Engineer, structural: More than 4 times per year    Marital Status: Married    Family History  Problem Relation Age of Onset   Lung disease Mother    Hypertension Mother    Lung disease Father    Alcoholism Father    Colon cancer Neg Hx    Colon polyps Neg Hx    Esophageal cancer Neg Hx    Stomach cancer Neg Hx    Rectal cancer Neg Hx     Health Maintenance  Topic Date Due   COVID-19 Vaccine (3 - 2024-25 season) 05/17/2023   Colonoscopy  06/18/2023   Medicare Annual Wellness  (AWV)  10/05/2024   DTaP/Tdap/Td (2 - Td or Tdap) 05/29/2026   Pneumonia Vaccine 21+ Years old  Completed   INFLUENZA VACCINE  Completed   Hepatitis C Screening  Completed   Zoster Vaccines- Shingrix  Completed   HPV VACCINES  Aged Out     ----------------------------------------------------------------------------------------------------------------------------------------------------------------------------------------------------------------- Physical Exam BP 138/82 (BP Location: Left Arm, Patient Position: Sitting, Cuff Size: Large)   Pulse 71   Ht 5\' 11"  (1.803 m)   Wt 299 lb (135.6 kg)   SpO2 98%   BMI 41.70 kg/m   Physical Exam Constitutional:      Appearance: Normal appearance.  HENT:     Head: Normocephalic and atraumatic.  Eyes:     General: No scleral icterus. Cardiovascular:     Rate and Rhythm: Normal rate and regular rhythm.  Pulmonary:     Effort: Pulmonary effort is normal.     Breath sounds: Normal breath sounds.  Musculoskeletal:     Cervical back: Neck supple.  Neurological:     Mental Status: He is alert.     ------------------------------------------------------------------------------------------------------------------------------------------------------------------------------------------------------------------- Assessment and Plan  Lumbar spondylosis He is seeing spine specialist and physiatry at this point.  He did not really have any significant improvement with the injections.  Stable with current pain medications.  HTN (hypertension) Blood pressure is well-controlled.  Continue lisinopril with hydrochlorothiazide at current strength.  HLD (hyperlipidemia) Updating lipid panel today.  Cognitive changes He still notices some intermittent cognitive changes.  Overall stable.  Does not want a pursue any further testing or treatment right now.  BPH (benign prostatic hyperplasia) Stable lower urinary tract symptoms with tamsulosin.   Recommend continuation.   No orders of the defined types were placed in this encounter.   Return in about 6 months (around 05/18/2024) for Hypertension.    This visit occurred during the SARS-CoV-2 public health emergency.  Safety protocols were in place, including screening questions prior to the visit, additional usage of staff PPE, and extensive cleaning of exam room while observing appropriate contact time as indicated for disinfecting solutions.

## 2023-11-16 NOTE — Assessment & Plan Note (Signed)
 He still notices some intermittent cognitive changes.  Overall stable.  Does not want a pursue any further testing or treatment right now.

## 2023-11-16 NOTE — Assessment & Plan Note (Signed)
Blood pressure is well-controlled.  Continue lisinopril with hydrochlorothiazide at current strength.

## 2023-11-16 NOTE — Assessment & Plan Note (Signed)
Stable lower urinary tract symptoms with tamsulosin.  Recommend continuation.

## 2023-11-17 DIAGNOSIS — E782 Mixed hyperlipidemia: Secondary | ICD-10-CM | POA: Diagnosis not present

## 2023-11-17 DIAGNOSIS — I10 Essential (primary) hypertension: Secondary | ICD-10-CM | POA: Diagnosis not present

## 2023-11-17 DIAGNOSIS — E559 Vitamin D deficiency, unspecified: Secondary | ICD-10-CM | POA: Diagnosis not present

## 2023-11-17 DIAGNOSIS — N400 Enlarged prostate without lower urinary tract symptoms: Secondary | ICD-10-CM | POA: Diagnosis not present

## 2023-11-17 DIAGNOSIS — R7303 Prediabetes: Secondary | ICD-10-CM | POA: Diagnosis not present

## 2023-11-18 ENCOUNTER — Encounter: Payer: Self-pay | Admitting: Family Medicine

## 2023-11-18 DIAGNOSIS — F1190 Opioid use, unspecified, uncomplicated: Secondary | ICD-10-CM | POA: Diagnosis not present

## 2023-11-18 DIAGNOSIS — M47816 Spondylosis without myelopathy or radiculopathy, lumbar region: Secondary | ICD-10-CM | POA: Diagnosis not present

## 2023-11-18 LAB — CBC WITH DIFFERENTIAL/PLATELET
Basophils Absolute: 0 10*3/uL (ref 0.0–0.2)
Basos: 0 %
EOS (ABSOLUTE): 0.1 10*3/uL (ref 0.0–0.4)
Eos: 2 %
Hematocrit: 47.9 % (ref 37.5–51.0)
Hemoglobin: 15.9 g/dL (ref 13.0–17.7)
Immature Grans (Abs): 0 10*3/uL (ref 0.0–0.1)
Immature Granulocytes: 0 %
Lymphocytes Absolute: 1.4 10*3/uL (ref 0.7–3.1)
Lymphs: 24 %
MCH: 31.1 pg (ref 26.6–33.0)
MCHC: 33.2 g/dL (ref 31.5–35.7)
MCV: 94 fL (ref 79–97)
Monocytes Absolute: 0.6 10*3/uL (ref 0.1–0.9)
Monocytes: 10 %
Neutrophils Absolute: 3.6 10*3/uL (ref 1.4–7.0)
Neutrophils: 64 %
Platelets: 189 10*3/uL (ref 150–450)
RBC: 5.11 x10E6/uL (ref 4.14–5.80)
RDW: 11.8 % (ref 11.6–15.4)
WBC: 5.7 10*3/uL (ref 3.4–10.8)

## 2023-11-18 LAB — CMP14+EGFR
ALT: 26 IU/L (ref 0–44)
AST: 21 IU/L (ref 0–40)
Albumin: 4.3 g/dL (ref 3.8–4.8)
Alkaline Phosphatase: 60 IU/L (ref 44–121)
BUN/Creatinine Ratio: 20 (ref 10–24)
BUN: 21 mg/dL (ref 8–27)
Bilirubin Total: 0.6 mg/dL (ref 0.0–1.2)
CO2: 27 mmol/L (ref 20–29)
Calcium: 9.3 mg/dL (ref 8.6–10.2)
Chloride: 103 mmol/L (ref 96–106)
Creatinine, Ser: 1.07 mg/dL (ref 0.76–1.27)
Globulin, Total: 2.2 g/dL (ref 1.5–4.5)
Glucose: 104 mg/dL — ABNORMAL HIGH (ref 70–99)
Potassium: 4.5 mmol/L (ref 3.5–5.2)
Sodium: 144 mmol/L (ref 134–144)
Total Protein: 6.5 g/dL (ref 6.0–8.5)
eGFR: 73 mL/min/{1.73_m2} (ref >59)

## 2023-11-18 LAB — VITAMIN D 25 HYDROXY (VIT D DEFICIENCY, FRACTURES): Vit D, 25-Hydroxy: 39 ng/mL (ref 30.0–100.0)

## 2023-11-18 LAB — LIPID PANEL WITH LDL/HDL RATIO
Cholesterol, Total: 117 mg/dL (ref 100–199)
HDL: 40 mg/dL (ref >39)
LDL Chol Calc (NIH): 57 mg/dL (ref 0–99)
LDL/HDL Ratio: 1.4 ratio (ref 0.0–3.6)
Triglycerides: 106 mg/dL (ref 0–149)
VLDL Cholesterol Cal: 20 mg/dL (ref 5–40)

## 2023-11-18 LAB — HEMOGLOBIN A1C
Est. average glucose Bld gHb Est-mCnc: 120 mg/dL
Hgb A1c MFr Bld: 5.8 % — ABNORMAL HIGH (ref 4.8–5.6)

## 2023-11-18 LAB — PSA: Prostate Specific Ag, Serum: 1.5 ng/mL (ref 0.0–4.0)

## 2023-11-19 ENCOUNTER — Encounter: Payer: Self-pay | Admitting: Family Medicine

## 2023-12-29 ENCOUNTER — Other Ambulatory Visit: Payer: Self-pay | Admitting: Family Medicine

## 2024-01-15 ENCOUNTER — Other Ambulatory Visit: Payer: Self-pay | Admitting: Family Medicine

## 2024-01-15 DIAGNOSIS — M47816 Spondylosis without myelopathy or radiculopathy, lumbar region: Secondary | ICD-10-CM

## 2024-02-02 DIAGNOSIS — C44329 Squamous cell carcinoma of skin of other parts of face: Secondary | ICD-10-CM | POA: Diagnosis not present

## 2024-02-02 DIAGNOSIS — L578 Other skin changes due to chronic exposure to nonionizing radiation: Secondary | ICD-10-CM | POA: Diagnosis not present

## 2024-02-02 DIAGNOSIS — C44622 Squamous cell carcinoma of skin of right upper limb, including shoulder: Secondary | ICD-10-CM | POA: Diagnosis not present

## 2024-02-02 DIAGNOSIS — L570 Actinic keratosis: Secondary | ICD-10-CM | POA: Diagnosis not present

## 2024-02-05 ENCOUNTER — Other Ambulatory Visit: Payer: Self-pay | Admitting: Family Medicine

## 2024-02-05 DIAGNOSIS — I10 Essential (primary) hypertension: Secondary | ICD-10-CM

## 2024-02-08 ENCOUNTER — Other Ambulatory Visit: Payer: Self-pay | Admitting: Family Medicine

## 2024-02-08 DIAGNOSIS — M47816 Spondylosis without myelopathy or radiculopathy, lumbar region: Secondary | ICD-10-CM

## 2024-02-15 DIAGNOSIS — G4733 Obstructive sleep apnea (adult) (pediatric): Secondary | ICD-10-CM | POA: Diagnosis not present

## 2024-02-16 DIAGNOSIS — M47816 Spondylosis without myelopathy or radiculopathy, lumbar region: Secondary | ICD-10-CM | POA: Diagnosis not present

## 2024-02-16 DIAGNOSIS — F1190 Opioid use, unspecified, uncomplicated: Secondary | ICD-10-CM | POA: Diagnosis not present

## 2024-02-18 DIAGNOSIS — M170 Bilateral primary osteoarthritis of knee: Secondary | ICD-10-CM | POA: Diagnosis not present

## 2024-02-25 ENCOUNTER — Other Ambulatory Visit: Payer: Self-pay | Admitting: Family Medicine

## 2024-03-03 DIAGNOSIS — D0461 Carcinoma in situ of skin of right upper limb, including shoulder: Secondary | ICD-10-CM | POA: Diagnosis not present

## 2024-03-05 ENCOUNTER — Other Ambulatory Visit: Payer: Self-pay | Admitting: Family Medicine

## 2024-03-05 DIAGNOSIS — M47816 Spondylosis without myelopathy or radiculopathy, lumbar region: Secondary | ICD-10-CM

## 2024-03-16 DIAGNOSIS — D0461 Carcinoma in situ of skin of right upper limb, including shoulder: Secondary | ICD-10-CM | POA: Diagnosis not present

## 2024-03-17 DIAGNOSIS — M170 Bilateral primary osteoarthritis of knee: Secondary | ICD-10-CM | POA: Diagnosis not present

## 2024-03-30 DIAGNOSIS — C4442 Squamous cell carcinoma of skin of scalp and neck: Secondary | ICD-10-CM | POA: Diagnosis not present

## 2024-05-11 DIAGNOSIS — M47816 Spondylosis without myelopathy or radiculopathy, lumbar region: Secondary | ICD-10-CM | POA: Diagnosis not present

## 2024-05-11 DIAGNOSIS — F1190 Opioid use, unspecified, uncomplicated: Secondary | ICD-10-CM | POA: Diagnosis not present

## 2024-05-17 ENCOUNTER — Encounter: Payer: Self-pay | Admitting: Sports Medicine

## 2024-05-18 ENCOUNTER — Encounter: Payer: Self-pay | Admitting: Family Medicine

## 2024-05-18 ENCOUNTER — Ambulatory Visit: Admitting: Family Medicine

## 2024-05-18 VITALS — BP 120/81 | HR 68 | Ht 71.0 in | Wt 297.0 lb

## 2024-05-18 DIAGNOSIS — R4189 Other symptoms and signs involving cognitive functions and awareness: Secondary | ICD-10-CM

## 2024-05-18 DIAGNOSIS — I10 Essential (primary) hypertension: Secondary | ICD-10-CM | POA: Diagnosis not present

## 2024-05-18 DIAGNOSIS — N400 Enlarged prostate without lower urinary tract symptoms: Secondary | ICD-10-CM | POA: Diagnosis not present

## 2024-05-18 DIAGNOSIS — Z23 Encounter for immunization: Secondary | ICD-10-CM | POA: Diagnosis not present

## 2024-05-18 DIAGNOSIS — G894 Chronic pain syndrome: Secondary | ICD-10-CM | POA: Diagnosis not present

## 2024-05-18 NOTE — Assessment & Plan Note (Signed)
 He is at a point where he would like to have neurological referral.  Orders placed.

## 2024-05-18 NOTE — Patient Instructions (Addendum)
 You can try an additional tylenol  arthritis strength- 1 in the morning and 1 in the evening in addition to your pain medication.

## 2024-05-18 NOTE — Assessment & Plan Note (Signed)
Stable lower urinary tract symptoms with tamsulosin.  Recommend continuation.

## 2024-05-18 NOTE — Assessment & Plan Note (Signed)
Blood pressure is well-controlled.  Continue lisinopril with hydrochlorothiazide at current strength.

## 2024-05-18 NOTE — Assessment & Plan Note (Signed)
 Chronic pain related to OA of the knees as well as lower back pain.  He is seeing pain management but does continue to deal with fairly continuous pain.  We discussed adding some additional Tylenol  and throughout the day.  He may continue occasional ibuprofen .

## 2024-05-18 NOTE — Progress Notes (Signed)
 John Morrow - 73 y.o. male MRN [id number]  Date of birth: [birth year]  Subjective Chief Complaint  Patient presents with   Hypertension   Pain    HPI John Morrow is a 73 y.o. male here today for follow up visit.   He reports that he is doing okay.   BP remains pretty well controlled with lisinporil/hydrochlorothiazide .  Denies side effects from medication at current strength.  He has not had chest pain, shortness of breath, palpitations, headache or vision changes.   He continues to have back pain and arthritic pain in other joints.  He is seeing pain management and prescribed norco once per day.  He does take occasional ibuprofen .  Meloxicam  was not really effective so he discontinued.  He has not tried any additional tylenol .   He has seen ortho x2 on the past regarding his knee pain.  Reports that he was told he needed to lose weight before surgery could be done.  He has had difficult losing weight due to limited activity from pain.   He does note continued cognitive change.  More forgetul.  No dangerous situations like getting lost while driving.   No behavioral change associated with this.  Previous labs have looked ok.   ROS:  A comprehensive ROS was completed and negative except as noted per HPI    Allergies  Allergen Reactions   Morphine And Codeine     Rash, vomiting, itching     Past Medical History:  Diagnosis Date   Allergy    Arthritis    Back pain    Diverticulosis of colon without hemorrhage 06/12/2016   On Colonoscopy 11/06/2010   HLD (hyperlipidemia) 05/29/2016   Hyperlipemia    Hypertension    Joint pain    Morbid obesity (HCC) 05/29/2016   Prediabetes    Right hip pain    needs hip replacement   Sleep apnea    on CPAP    Past Surgical History:  Procedure Laterality Date   EYE SURGERY     age 59   JOINT REPLACEMENT     Hip   KNEE SURGERY     REPLACEMENT TOTAL HIP W/  RESURFACING IMPLANTS Right 03/2019   Dr. Kayren    Social History    Socioeconomic History   Marital status: Married    Spouse name: Orie   Number of children: 4   Years of education: 21   Highest education level: Patent examiner History   Occupation: retired    Comment: Runner, broadcasting/film/video  Tobacco Use   Smoking status: Never   Smokeless tobacco: Never  Vaping Use   Vaping status: Never Used  Substance and Sexual Activity   Alcohol use: Not Currently    Comment: rarely   Drug use: No   Sexual activity: Not Currently    Partners: Female  Other Topics Concern   Not on file  Social History Narrative   Lives with his wife. D[facility] to [facility] for appointments.Delivers groceries to elderly as needed.   Social Drivers of Corporate investment banker Strain: Low Risk  (05/15/2024)   Overall Financial Resource Strain (CARDIA)    Difficulty of Paying Living Expenses: Not hard at all  Food Insecurity: No Food Insecurity (05/15/2024)   Hunger Vital Sign    Worried About Running Out of Food in the Last Year: Never true    Ran Out of Food in the Last Year: Never true  Transportation Needs: No Transportation Needs (05/15/2024)  PRAPARE - Administrator, Civil Service (Medical): No    Lack of Transportation (Non-Medical): No  Physical Activity: Insufficiently Active (05/15/2024)   Exercise Vital Sign    Days of Exercise per Week: 1 day    Minutes of Exercise per Session: 30 min  Stress: No Stress Concern Present (05/15/2024)   [employer] of Occupational Health - Occupational Stress Questionnaire    Feeling of Stress: Only a little  Social Connections: Socially Integrated (05/15/2024)   Social Connection and Isolation Panel    Frequency of Communication with Friends and Family: Three times a week    Frequency of Social Gatherings with Friends and Family: Once a week    Attends Religious Services: More than 4 times per year    Active Member of Clubs or Organizations: Yes    Attends Engineer, structural: More  than 4 times per year    Marital Status: Married    Family History  Problem Relation Age of Onset   Lung disease Mother    Hypertension Mother    Lung disease Father    Alcoholism Father    Colon cancer Neg Hx    Colon polyps Neg Hx    Esophageal cancer Neg Hx    Stomach cancer Neg Hx    Rectal cancer Neg Hx     Health Maintenance  Topic Date Due   COVID-19 Vaccine (3 - 2025-26 season) 05/17/2025 (Originally 05/16/2024)   Medicare Annual Wellness (AWV)  10/05/2024   DTaP/Tdap/Td (2 - Td or Tdap) 05/29/2026   Pneumococcal Vaccine: 50+ Years  Completed   INFLUENZA VACCINE  Completed   Hepatitis C Screening  Completed   Zoster Vaccines- Shingrix  Completed   HPV VACCINES  Aged Out   Meningococcal B Vaccine  Aged Out   Colonoscopy  Discontinued     ----------------------------------------------------------------------------------------------------------------------------------------------------------------------------------------------------------------- Physical Exam BP 120/81 (BP Location: Left Arm, Patient Position: Sitting, Cuff Size: Large)   Pulse 68   Ht 5' 11 (1.803 m)   Wt 297 lb (134.7 kg)   SpO2 96%   BMI 41.42 kg/m   Physical Exam Constitutional:      Appearance: Normal appearance.  Eyes:     General: No scleral icterus. Cardiovascular:     Rate and Rhythm: Normal rate and regular rhythm.  Pulmonary:     Effort: Pulmonary effort is normal.     Breath sounds: Normal breath sounds.  Neurological:     General: No focal deficit present.     Mental Status: He is alert.  Psychiatric:        Mood and Affect: Mood normal.        Behavior: Behavior normal.     ------------------------------------------------------------------------------------------------------------------------------------------------------------------------------------------------------------------- Assessment and Plan  HTN (hypertension) Blood pressure is well-controlled.  Continue  lisinopril  with hydrochlorothiazide  at current strength.  BPH (benign prostatic hyperplasia) Stable lower urinary tract symptoms with tamsulosin .  Recommend continuation.  Chronic pain Chronic pain related to OA of the knees as well as lower back pain.  He is seeing pain management but does continue to deal with fairly continuous pain.  We discussed adding some additional Tylenol  and throughout the day.  He may continue occasional ibuprofen .  Cognitive changes He is at a point where he would like to have neurological referral.  Orders placed.   No orders of the defined types were placed in this encounter.   Return in about 6 months (around 11/15/2024) for Hypertension.

## 2024-05-25 ENCOUNTER — Other Ambulatory Visit: Payer: Self-pay | Admitting: Family Medicine

## 2024-06-01 ENCOUNTER — Other Ambulatory Visit: Payer: Self-pay | Admitting: Family Medicine

## 2024-06-01 DIAGNOSIS — I10 Essential (primary) hypertension: Secondary | ICD-10-CM

## 2024-06-01 DIAGNOSIS — G4733 Obstructive sleep apnea (adult) (pediatric): Secondary | ICD-10-CM | POA: Diagnosis not present

## 2024-06-16 ENCOUNTER — Encounter: Payer: Self-pay | Admitting: Neurology

## 2024-06-16 ENCOUNTER — Ambulatory Visit: Admitting: Neurology

## 2024-06-16 DIAGNOSIS — R4189 Other symptoms and signs involving cognitive functions and awareness: Secondary | ICD-10-CM | POA: Diagnosis not present

## 2024-06-16 DIAGNOSIS — R251 Tremor, unspecified: Secondary | ICD-10-CM

## 2024-06-16 MED ORDER — VITAMIN B-12 1000 MCG PO TABS: 1000.0000 ug | ORAL_TABLET | Freq: Every day | ORAL | 3 refills | Status: AC

## 2024-06-16 NOTE — Patient Instructions (Signed)
 MRI brain for memory loss Start B12 supplement Continue to monitor this tremors Follow-up in 3 months or sooner

## 2024-06-16 NOTE — Progress Notes (Signed)
 [facility]  PATIENT: John Morrow DOB: Jul 05, 1951  REQUESTING CLINICIAN: Alvia Bring, DO HISTORY FROM: Patient and spouse  REASON FOR VISIT: Memory concerns, Tremors    HISTORICAL  CHIEF COMPLAINT:  Chief Complaint  Patient presents with   New Patient (Initial Visit)    Rm 13, with wife John Morrow, memory concerns, tremors in hands    HISTORY OF PRESENT ILLNESS:  Discussed the use of AI scribe software for clinical note transcription with the patient, who gave verbal consent to proceed.  John Morrow is a 73 year old male with history of hypertension, hyperlipidemia, sleep apnea, obesity who presents with memory concerns and tremors. He is accompanied by his wife.  He has been experiencing increasing difficulty with short-term memory over the past couple of years, including challenges with recalling events or people's names, although he can usually retrieve the information after some time. There is a slight progression in these symptoms, but he is not overly concerned. His wife mentions that while she does not find his memory issues alarming, she is more concerned about his tremors. He is independent in all ADLs, and IADLs.   He has been experiencing tremors in his hands for several months, particularly noticeable when walking. His wife observes that his hands shake while walking, but he does not typically cross or hold his arms when sitting. He denies any difficulty with writing or eating. There is no family history of tremors, although his sister in law has Parkinson's disease.  He reports no issues with daily activities, driving, or managing finances. He uses a calendar and alarms on his phone to help with reminders, which he did not use before. His sleep is reportedly fine, and he uses a CPAP machine. No acting out dreams, dizziness, or lightheadedness. He takes hydrocodone  once a day for pain management.    TBI:   No past history of TBI Stroke:    no past history of stroke Seizures:   no past history of seizures Sleep:  Yes, uses a CPAP Mood:  patient denies anxiety and depression Family history of Dementia:  Denies  Functional status: independent in all ADLs and IADLs Patient lives with spouse. Cooking: no issues Cleaning: no issues  Shopping: no issues  Bathing:  no issues  Toileting: no issues  Driving: no issues  Bills: no issues  Medications: no issues Ever left the stove on by accident?: denies Forget how to use items around the house?: denies Getting lost going to familiar places?: denies Forgetting loved ones names?: denies Word finding difficulty? denies Sleep: good    OTHER MEDICAL CONDITIONS: Sleep apnea on CPAP, chronic back pain, hypertension, hyperlipidemia    REVIEW OF SYSTEMS: Full 14 system review of systems performed and negative with exception of: As noted in the HPI   ALLERGIES: Allergies  Allergen Reactions   Morphine And Codeine     Rash, vomiting, itching     HOME MEDICATIONS: Outpatient Medications Prior to Visit  Medication Sig Dispense Refill   HYDROcodone -acetaminophen  (NORCO/VICODIN) 5-325 MG tablet Take 1 tablet by mouth daily as needed.     lisinopril -hydrochlorothiazide  (ZESTORETIC ) 20-25 MG tablet Take 1 tablet by mouth once daily 120 tablet 0   Misc. Devices MISC Start auto cpap 5-15 cm. Water with mask and supplies     omeprazole  (PRILOSEC) 20 MG capsule Take 1 capsule (20 mg total) by mouth daily. 14 capsule 0   rosuvastatin  (CRESTOR ) 10 MG tablet Take 1 tablet by mouth once daily 90 tablet  2   tamsulosin  (FLOMAX ) 0.4 MG CAPS capsule Take 1 capsule by mouth once daily 90 capsule 0   meloxicam  (MOBIC ) 15 MG tablet TAKE 1 TABLET BY MOUTH ONCE DAILY AS NEEDED FOR PAIN (Patient not taking: Reported on 06/16/2024) 30 tablet 0   No facility-administered medications prior to visit.    PAST MEDICAL HISTORY: Past Medical History:  Diagnosis Date   Allergy    Arthritis    Back  pain    Diverticulosis of colon without hemorrhage 06/12/2016   On Colonoscopy 11/06/2010   HLD (hyperlipidemia) 05/29/2016   Hyperlipemia    Hypertension    Joint pain    Morbid obesity (HCC) 05/29/2016   Prediabetes    Right hip pain    needs hip replacement   Sleep apnea    on CPAP    PAST SURGICAL HISTORY: Past Surgical History:  Procedure Laterality Date   EYE SURGERY     age 73   JOINT REPLACEMENT     Hip   KNEE SURGERY     REPLACEMENT TOTAL HIP W/  RESURFACING IMPLANTS Right 03/2019   Dr. Kayren    FAMILY HISTORY: Family History  Problem Relation Age of Onset   Lung disease Mother    Hypertension Mother    Lung disease Father    Alcoholism Father    Colon cancer Neg Hx    Colon polyps Neg Hx    Esophageal cancer Neg Hx    Stomach cancer Neg Hx    Rectal cancer Neg Hx     SOCIAL HISTORY: Social History   Socioeconomic History   Marital status: Married    Spouse name: John Morrow   Number of children: 4   Years of education: 21   Highest education level: Patent examiner History   Occupation: retired    Comment: Runner, broadcasting/film/video  Tobacco Use   Smoking status: Never   Smokeless tobacco: Never  Vaping Use   Vaping status: Never Used  Substance and Sexual Activity   Alcohol use: Not Currently    Comment: rarely   Drug use: No   Sexual activity: Not Currently    Partners: Female  Other Topics Concern   Not on file  Social History Narrative   Lives with his wife. 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    Social Drivers of Corporate investment banker Strain: Low Risk  (05/15/2024)   Overall Financial Resource Strain (CARDIA)    Difficulty of Paying Living Expenses: Not hard at all  Food Insecurity: No Food Insecurity (05/15/2024)   Hunger Vital Sign    Worried About Running Out of Food in the Last Year: Never true    Ran Out of Food in the Last Year: Never true  Transportation Needs: No  Transportation Needs (05/15/2024)   PRAPARE - Administrator, Civil Service (Medical): No    Lack of Transportation (Non-Medical): No  Physical Activity: Insufficiently Active (05/15/2024)   Exercise Vital Sign    Days of Exercise per Week: 1 day    Minutes of Exercise per Session: 30 min  Stress: No Stress Concern Present (05/15/2024)   [employer] of Occupational Health - Occupational Stress Questionnaire    Feeling of Stress: Only a little  Social Connections: Socially Integrated (05/15/2024)   Social Connection and Isolation Panel    Frequency of Communication with Friends and Family: Three times a week    Frequency of Social Gatherings with Friends  and Family: Once a week    Attends Religious Services: More than 4 times per year    Active Member of Clubs or Organizations: Yes    Attends Banker Meetings: More than 4 times per year    Marital Status: Married  Catering manager Violence: Not At Risk (10/06/2023)   Humiliation, Afraid, Rape, and Kick questionnaire    Fear of Current or Ex-Partner: No    Emotionally Abused: No    Physically Abused: No    Sexually Abused: No    PHYSICAL EXAM  GENERAL EXAM/CONSTITUTIONAL: Vitals: There were no vitals filed for this visit. There is no height or weight on file to calculate BMI. Wt Readings from Last 3 Encounters:  05/18/24 297 lb (134.7 kg)  11/16/23 299 lb (135.6 kg)  10/06/23 290 lb (131.5 kg)   Patient is in no distress; well developed, nourished and groomed; neck is supple  MUSCULOSKELETAL: Gait, strength, tone, movements noted in Neurologic exam below  NEUROLOGIC: MENTAL STATUS:      No data to display            06/16/2024   11:14 AM  Montreal Cognitive Assessment   Visuospatial/ Executive (0/5) 4  Naming (0/3) 3  Attention: Read list of digits (0/2) 2  Attention: Read list of letters (0/1) 0  Attention: Serial 7 subtraction starting at 100 (0/3) 3  Language: Repeat phrase (0/2) 1   Language : Fluency (0/1) 0  Abstraction (0/2) 2  Delayed Recall (0/5) 0  Orientation (0/6) 5  Total 20    awake, alert, oriented to person, place and time recent and remote memory intact language fluent, comprehension intact, naming intact fund of knowledge appropriate  CRANIAL NERVE:  2nd, 3rd, 4th, 6th- visual fields full to confrontation, extraocular muscles intact, no nystagmus 5th - facial sensation symmetric 7th - facial strength symmetric 8th - hearing intact 9th - palate elevates symmetrically, uvula midline 11th - shoulder shrug symmetric 12th - tongue protrusion midline  MOTOR:  normal bulk and tone, full strength in the BUE, BLE. No bradykinesia, no rigidity.   SENSORY:  normal and symmetric to light touch  COORDINATION:  finger-nose-finger, fine finger movements normal. He does have a tremor when walker, high frequency, and when distracted able to see the tremors at rest (he tends to cross his fingers).   There is also action tremors worse on the left.   GAIT/STATION:  Antalgic gait due to chronic low back pain. Could not appreciate a shuffling gait.   DIAGNOSTIC DATA (LABS, IMAGING, TESTING) - I reviewed patient records, labs, notes, testing and imaging myself where available.  Lab Results  Component Value Date   WBC 5.7 11/17/2023   HGB 15.9 11/17/2023   HCT 47.9 11/17/2023   MCV 94 11/17/2023   PLT 189 11/17/2023      Component Value Date/Time   NA 144 11/17/2023 0811   K 4.5 11/17/2023 0811   CL 103 11/17/2023 0811   CO2 27 11/17/2023 0811   GLUCOSE 104 (H) 11/17/2023 0811   GLUCOSE 98 04/07/2022 0000   BUN 21 11/17/2023 0811   CREATININE 1.07 11/17/2023 0811   CREATININE 1.10 04/07/2022 0000   CALCIUM  9.3 11/17/2023 0811   PROT 6.5 11/17/2023 0811   ALBUMIN 4.3 11/17/2023 0811   AST 21 11/17/2023 0811   ALT 26 11/17/2023 0811   ALKPHOS 60 11/17/2023 0811   BILITOT 0.6 11/17/2023 0811   GFRNONAA >60 03/29/2022 1347   GFRNONAA 62  02/14/2021 0829  GFRAA 72 02/14/2021 0829   Lab Results  Component Value Date   CHOL 117 11/17/2023   HDL 40 11/17/2023   LDLCALC 57 11/17/2023   TRIG 106 11/17/2023   CHOLHDL 3.1 08/15/2021   Lab Results  Component Value Date   HGBA1C 5.8 (H) 11/17/2023   Lab Results  Component Value Date   VITAMINB12 452 01/07/2023   Lab Results  Component Value Date   TSH 1.13 01/07/2023      ASSESSMENT AND PLAN  73 y.o. year old male with history of hypertension, hyperlipidemia, sleep apnea on CPAP, chronic pain and obesity who is presenting with cognitive changes described as short-term memory loss.  He is independent in all ADLs.  Inform patient that he likely has mild cognitive impairment for now I will recommend B12 supplement and we will obtain MRI brain. For his tremors, he does have both action and resting tremor.  At this time I will advise patient to continue monitoring these tremors for the next 3 months.   Left upper extremity tremor, unspecified type Presents with a tremor in the left upper extremity, primarily noticeable when walking and occasionally at rest. He also has action tremors with the left hand. The tremor has been present for several months. Differential diagnosis include essential tremor, versus Parkinson's disease tremors. There is a slight resting tremor, which could suggest early Parkinson's disease, but it is too early to confirm. We will continue to monitor and let the tremor declares itself. On exam, he does not exhibit bradykinesia or rigidity - Monitor tremor over the next few months - Instruct wife to record tremor episodes when he is unaware - Schedule follow-up appointment in three months - Consider DAT scan if diagnosis remains unclear - Consider referral to a movement disorder specialist if necessary  Cognitive impairment Reports subjective memory impairment, primarily with short-term memory and recalling names and events. This has been ongoing for a  couple of years with slight progression. There is no impact on activities of daily living, driving, or financial management. The memory issues are not consistent with dementia, as he is aware of the forgetfulness. The differential includes normal age-related forgetfulness and mild cognitive impairment. There is no significant concern from his wife regarding his memory. - Order MRI of the brain to evaluate memory issues - His B12 and thyroid  levels last year were within normal limits, we will still recommend B12 supplement     1. Tremor   2. Cognitive impairment      Patient Instructions  MRI brain for memory loss Start B12 supplement Continue to monitor this tremors Follow-up in 3 months or sooner  Orders Placed This Encounter  Procedures   MR BRAIN WO CONTRAST    Meds ordered this encounter  Medications   cyanocobalamin  (VITAMIN B12) 1000 MCG tablet    Sig: Take 1 tablet (1,000 mcg total) by mouth daily.    Dispense:  90 tablet    Refill:  3    Return in about 4 months (around 10/14/2024).    Pastor Falling, MD 06/16/2024, 2:00 PM  [facility] [address], [address] [place], [place] [address] [phone]

## 2024-06-21 DIAGNOSIS — L814 Other melanin hyperpigmentation: Secondary | ICD-10-CM | POA: Diagnosis not present

## 2024-06-21 DIAGNOSIS — D485 Neoplasm of uncertain behavior of skin: Secondary | ICD-10-CM | POA: Diagnosis not present

## 2024-06-21 DIAGNOSIS — C44329 Squamous cell carcinoma of skin of other parts of face: Secondary | ICD-10-CM | POA: Diagnosis not present

## 2024-06-21 DIAGNOSIS — L570 Actinic keratosis: Secondary | ICD-10-CM | POA: Diagnosis not present

## 2024-06-21 DIAGNOSIS — D0461 Carcinoma in situ of skin of right upper limb, including shoulder: Secondary | ICD-10-CM | POA: Diagnosis not present

## 2024-06-22 ENCOUNTER — Encounter: Payer: Self-pay | Admitting: Neurology

## 2024-06-30 ENCOUNTER — Ambulatory Visit
Admission: RE | Admit: 2024-06-30 | Discharge: 2024-06-30 | Disposition: A | Discharge status: Home / Self Care | Source: Ambulatory Visit | Attending: Neurology | Admitting: Neurology

## 2024-06-30 DIAGNOSIS — R4189 Other symptoms and signs involving cognitive functions and awareness: Secondary | ICD-10-CM | POA: Diagnosis not present

## 2024-07-01 ENCOUNTER — Ambulatory Visit: Payer: Self-pay | Admitting: Neurology

## 2024-07-03 ENCOUNTER — Other Ambulatory Visit

## 2024-07-04 ENCOUNTER — Ambulatory Visit: Admitting: Neurology

## 2024-08-03 DIAGNOSIS — F1190 Opioid use, unspecified, uncomplicated: Secondary | ICD-10-CM | POA: Diagnosis not present

## 2024-08-03 DIAGNOSIS — M47816 Spondylosis without myelopathy or radiculopathy, lumbar region: Secondary | ICD-10-CM | POA: Diagnosis not present

## 2024-08-04 ENCOUNTER — Other Ambulatory Visit: Payer: Self-pay | Admitting: Neurosurgery

## 2024-08-04 DIAGNOSIS — M47816 Spondylosis without myelopathy or radiculopathy, lumbar region: Secondary | ICD-10-CM

## 2024-08-05 ENCOUNTER — Encounter: Payer: Self-pay | Admitting: Neurosurgery

## 2024-08-22 ENCOUNTER — Other Ambulatory Visit: Payer: Self-pay | Admitting: Family Medicine

## 2024-08-26 ENCOUNTER — Ambulatory Visit
Admission: RE | Admit: 2024-08-26 | Discharge: 2024-08-26 | Disposition: A | Source: Ambulatory Visit | Attending: Neurosurgery | Admitting: Neurosurgery

## 2024-08-26 DIAGNOSIS — M47816 Spondylosis without myelopathy or radiculopathy, lumbar region: Secondary | ICD-10-CM

## 2024-08-30 ENCOUNTER — Other Ambulatory Visit: Payer: Self-pay

## 2024-08-30 DIAGNOSIS — I10 Essential (primary) hypertension: Secondary | ICD-10-CM

## 2024-08-30 MED ORDER — LISINOPRIL-HYDROCHLOROTHIAZIDE 20-25 MG PO TABS: 1.0000 | ORAL_TABLET | Freq: Every day | ORAL | 1 refills | Status: AC

## 2024-09-19 ENCOUNTER — Other Ambulatory Visit: Payer: Self-pay | Admitting: Family Medicine

## 2024-09-19 DIAGNOSIS — E782 Mixed hyperlipidemia: Secondary | ICD-10-CM

## 2024-10-06 ENCOUNTER — Ambulatory Visit (INDEPENDENT_AMBULATORY_CARE_PROVIDER_SITE_OTHER)

## 2024-10-06 ENCOUNTER — Ambulatory Visit

## 2024-10-06 VITALS — BP 106/86 | HR 69 | Ht 71.0 in | Wt 301.0 lb

## 2024-10-06 DIAGNOSIS — Z0000 Encounter for general adult medical examination without abnormal findings: Secondary | ICD-10-CM | POA: Diagnosis not present

## 2024-10-06 NOTE — Patient Instructions (Signed)
 John Morrow,  Thank you for taking the time for your Medicare Wellness Visit. I appreciate your continued commitment to your health goals. Please review the care plan we discussed, and feel free to reach out if I can assist you further.  Please note that Annual Wellness Visits do not include a physical exam. Some assessments may be limited, especially if the visit was conducted virtually. If needed, we may recommend an in-person follow-up with your provider.  Ongoing Care Seeing your primary care provider every 3 to 6 months helps us  monitor your health and provide consistent, personalized care.   Referrals If a referral was made during today's visit and you haven't received any updates within two weeks, please contact the referred provider directly to check on the status.  Recommended Screenings:  Health Maintenance  Topic Date Due   Medicare Annual Wellness Visit  10/05/2024   COVID-19 Vaccine (3 - 2025-26 season) 05/17/2025*   DTaP/Tdap/Td vaccine (2 - Td or Tdap) 05/29/2026   Pneumococcal Vaccine for age over 59  Completed   Flu Shot  Completed   Hepatitis C Screening  Completed   Zoster (Shingles) Vaccine  Completed   Meningitis B Vaccine  Aged Out   Colon Cancer Screening  Discontinued  *Topic was postponed. The date shown is not the original due date.       10/06/2024   10:51 AM  Advanced Directives  Does Patient Have a Medical Advance Directive? Yes  Type of Estate Agent of [place];Living will  Does patient want to make changes to medical advance directive? No - Patient declined    Vision: Annual vision screenings are recommended for early detection of glaucoma, cataracts, and diabetic retinopathy. These exams can also reveal signs of chronic conditions such as diabetes and high blood pressure.  Dental: Annual dental screenings help detect early signs of oral cancer, gum disease, and other conditions linked to overall health, including heart  disease and diabetes.  Please see the attached documents for additional preventive care recommendations.

## 2024-10-06 NOTE — Progress Notes (Signed)
 "  Chief Complaint  Patient presents with   Medicare Wellness     Subjective:   Marv Alfrey is a 74 y.o. male who presents for a Medicare Annual Wellness Visit.  Visit info / Clinical Intake: Medicare Wellness Visit Type:: Subsequent Annual Wellness Visit Persons participating in visit and providing information:: patient Medicare Wellness Visit Mode:: In-person (required for WTM) Interpreter Needed?: No Pre-visit prep was completed: yes AWV questionnaire completed by patient prior to visit?: yes Date:: 10/02/24 Living arrangements:: lives with spouse/significant other Patient's Overall Health Status Rating: (!) fair Typical amount of pain: (!) a lot Does pain affect daily life?: (!) yes Are you currently prescribed opioids?: (!) yes  Dietary Habits and Nutritional Risks How many meals a day?: 3 Eats fruit and vegetables daily?: yes Most meals are obtained by: preparing own meals In the last 2 weeks, have you had any of the following?: none Diabetic:: no  Functional Status Activities of Daily Living (to include ambulation/medication): Independent Ambulation: Independent Medication Administration: Independent Home Management (perform basic housework or laundry): Independent Manage your own finances?: yes Primary transportation is: driving Concerns about vision?: no *vision screening is required for WTM* Concerns about hearing?: no  Fall Screening Falls in the past year?: 0 Number of falls in past year: 0 Was there an injury with Fall?: 0 Fall Risk Category Calculator: 0 Patient Fall Risk Level: Low Fall Risk  Fall Risk Patient at Risk for Falls Due to: No Fall Risks Fall risk Follow up: Falls evaluation completed  Home and Transportation Safety: All rugs have non-skid backing?: (!) no All stairs or steps have railings?: N/A, no stairs Grab bars in the bathtub or shower?: (!) no Have non-skid surface in bathtub or shower?: (!) no Good home lighting?:  yes Regular seat belt use?: yes Hospital stays in the last year:: no  Cognitive Assessment Difficulty concentrating, remembering, or making decisions? : yes Will 6CIT or Mini Cog be Completed: yes What year is it?: 0 points What month is it?: 0 points Give patient an address phrase to remember (5 components): [address] [place], [place] About what time is it?: 0 points Count backwards from 20 to 1: 0 points Say the months of the year in reverse: 0 points Repeat the address phrase from earlier: 0 points 6 CIT Score: 0 points  Advance Directives (For Healthcare) Does Patient Have a Medical Advance Directive?: Yes Does patient want to make changes to medical advance directive?: No - Patient declined Type of Advance Directive: Healthcare Power of [place]; Living will  Reviewed/Updated  Reviewed/Updated: Medical History; Surgical History; Medications; Allergies; Care Teams; Patient Goals    Allergies (verified) Morphine and codeine   Current Medications (verified) Outpatient Encounter Medications as of 10/06/2024  Medication Sig   cyanocobalamin  (VITAMIN B12) 1000 MCG tablet Take 1 tablet (1,000 mcg total) by mouth daily.   HYDROcodone -acetaminophen  (NORCO/VICODIN) 5-325 MG tablet Take 1 tablet by mouth daily as needed.   lisinopril -hydrochlorothiazide  (ZESTORETIC ) 20-25 MG tablet Take 1 tablet by mouth daily.   Misc. Devices MISC Start auto cpap 5-15 cm. Water with mask and supplies   rosuvastatin  (CRESTOR ) 10 MG tablet Take 1 tablet by mouth once daily   tamsulosin  (FLOMAX ) 0.4 MG CAPS capsule Take 1 capsule by mouth once daily   meloxicam  (MOBIC ) 15 MG tablet TAKE 1 TABLET BY MOUTH ONCE DAILY AS NEEDED FOR PAIN (Patient not taking: Reported on 10/06/2024)   [DISCONTINUED] omeprazole  (PRILOSEC) 20 MG capsule Take 1 capsule (20 mg total) by  mouth daily.   No facility-administered encounter medications on file as of 10/06/2024.    History: Past Medical History:  Diagnosis  Date   Allergy    Arthritis    Back pain    Diverticulosis of colon without hemorrhage 06/12/2016   On Colonoscopy 11/06/2010   HLD (hyperlipidemia) 05/29/2016   Hyperlipemia    Hypertension    Joint pain    Morbid obesity (HCC) 05/29/2016   Prediabetes    Right hip pain    needs hip replacement   Sleep apnea    on CPAP   Past Surgical History:  Procedure Laterality Date   EYE SURGERY     age 33   JOINT REPLACEMENT     Hip   KNEE SURGERY     REPLACEMENT TOTAL HIP W/  RESURFACING IMPLANTS Right 03/2019   Dr. Kayren   Family History  Problem Relation Age of Onset   Lung disease Mother    Hypertension Mother    Lung disease Father    Alcoholism Father    Colon cancer Neg Hx    Colon polyps Neg Hx    Esophageal cancer Neg Hx    Stomach cancer Neg Hx    Rectal cancer Neg Hx    Social History   Occupational History   Occupation: retired    Comment: runner, broadcasting/film/video  Tobacco Use   Smoking status: Never   Smokeless tobacco: Never  Vaping Use   Vaping status: Never Used  Substance and Sexual Activity   Alcohol use: Not Currently    Comment: rarely   Drug use: No   Sexual activity: Not Currently    Partners: Female   Tobacco Counseling Counseling given: Not Answered  SDOH Screenings   Food Insecurity: No Food Insecurity (10/06/2024)  Housing: Low Risk (10/06/2024)  Transportation Needs: No Transportation Needs (10/06/2024)  Utilities: Not At Risk (10/06/2024)  Alcohol Screen: Low Risk (05/15/2024)  Depression (PHQ2-9): Low Risk (10/06/2024)  Financial Resource Strain: Low Risk (10/02/2024)  Physical Activity: Insufficiently Active (10/06/2024)  Social Connections: Socially Integrated (10/06/2024)  Stress: No Stress Concern Present (10/06/2024)  Tobacco Use: Low Risk (10/06/2024)  Health Literacy: Adequate Health Literacy (10/06/2024)   See flowsheets for full screening details  Depression Screen PHQ 2 & 9 Depression Scale- Over the past 2 weeks, how often have you been  bothered by any of the following problems? Little interest or pleasure in doing things: 0 Feeling down, depressed, or hopeless (PHQ Adolescent also includes...irritable): 0 PHQ-2 Total Score: 0     Goals Addressed             This Visit's Progress    Patient Stated       Patient would like to get rid of lower back and bilateral knee pain.              Objective:    Today's Vitals   10/06/24 1037  BP: (!) 141/80  Pulse: 69  SpO2: 98%  Weight: (!) 301 lb (136.5 kg)  Height: 5' 11 (1.803 m)   Body mass index is 41.98 kg/m.  Hearing/Vision screen No results found. Immunizations and Health Maintenance Health Maintenance  Topic Date Due   COVID-19 Vaccine (3 - 2025-26 season) 05/17/2025 (Originally 05/16/2024)   Medicare Annual Wellness (AWV)  10/06/2025   DTaP/Tdap/Td (2 - Td or Tdap) 05/29/2026   Pneumococcal Vaccine: 50+ Years  Completed   Influenza Vaccine  Completed   Hepatitis C Screening  Completed   Zoster Vaccines- Shingrix  Completed   Meningococcal B Vaccine  Aged Out   Colonoscopy  Discontinued        Assessment/Plan:  This is a routine wellness examination for Joshva.  Patient Care Team: Alvia Bring, DO as PCP - General (Family Medicine) Gregg Lek, MD as Consulting Physician (Neurology) Otelia Angelita Pride, PA-C (Orthopedic Surgery)  I have personally reviewed and noted the following in the patients chart:   Medical and social history Use of alcohol, tobacco or illicit drugs  Current medications and supplements including opioid prescriptions. Functional ability and status Nutritional status Physical activity Advanced directives List of other physicians Hospitalizations, surgeries, and ER visits in previous 12 months Vitals Screenings to include cognitive, depression, and falls Referrals and appointments  No orders of the defined types were placed in this encounter.  In addition, I have reviewed and discussed with patient  certain preventive protocols, quality metrics, and best practice recommendations. A written personalized care plan for preventive services as well as general preventive health recommendations were provided to patient.   Bonny Jon Mayor, CMA   10/06/2024   Return in 1 year (on 10/06/2025).  After Visit Summary: (In Person-Declined) Patient declined AVS at this time.  Nurse Notes:   Matej Sappenfield is a 74 y.o. male patient of Alvia Bring, DO who had a Medicare Annual Wellness Visit today via telephone. Loron is Retired and lives with their spouse. He has 4 children. He reports that he is socially active and does interact with friends/family regularly. 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  "

## 2024-10-14 ENCOUNTER — Ambulatory Visit: Admitting: Neurology

## 2024-10-14 ENCOUNTER — Encounter: Payer: Self-pay | Admitting: Neurology

## 2024-10-14 VITALS — BP 137/92 | HR 75 | Wt 298.0 lb

## 2024-10-14 DIAGNOSIS — R4189 Other symptoms and signs involving cognitive functions and awareness: Secondary | ICD-10-CM | POA: Diagnosis not present

## 2024-10-14 DIAGNOSIS — R251 Tremor, unspecified: Secondary | ICD-10-CM | POA: Diagnosis not present

## 2024-10-14 NOTE — Patient Instructions (Signed)
 Continue current medications  Continue to monitor tremors  Increase exercise Return as needed

## 2024-11-15 ENCOUNTER — Ambulatory Visit: Admitting: Family Medicine

## 2025-10-11 ENCOUNTER — Ambulatory Visit
